# Patient Record
Sex: Female | Born: 1985 | Race: Black or African American | Hispanic: No | Marital: Single | Smoking: Never smoker
Health system: Southern US, Community
[De-identification: ages and names within clinical notes are randomized; demographics above are authoritative.]

## PROBLEM LIST (undated history)

## (undated) DIAGNOSIS — R0602 Shortness of breath: Secondary | ICD-10-CM

## (undated) DIAGNOSIS — Z21 Asymptomatic human immunodeficiency virus [HIV] infection status: Secondary | ICD-10-CM

## (undated) DIAGNOSIS — E669 Obesity, unspecified: Secondary | ICD-10-CM

## (undated) DIAGNOSIS — B20 Human immunodeficiency virus [HIV] disease: Secondary | ICD-10-CM

## (undated) DIAGNOSIS — F419 Anxiety disorder, unspecified: Secondary | ICD-10-CM

## (undated) DIAGNOSIS — B009 Herpesviral infection, unspecified: Secondary | ICD-10-CM

## (undated) DIAGNOSIS — I10 Essential (primary) hypertension: Secondary | ICD-10-CM

## (undated) DIAGNOSIS — R5383 Other fatigue: Secondary | ICD-10-CM

## (undated) DIAGNOSIS — K219 Gastro-esophageal reflux disease without esophagitis: Secondary | ICD-10-CM

## (undated) HISTORY — DX: Shortness of breath: R06.02

## (undated) HISTORY — PX: INCISION AND DRAINAGE ABSCESS: SHX5864

## (undated) HISTORY — DX: Other fatigue: R53.83

## (undated) HISTORY — PX: ANKLE SURGERY: SHX546

---
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
## 2004-08-25 ENCOUNTER — Emergency Department (HOSPITAL_COMMUNITY): Admission: EM | Admit: 2004-08-25 | Discharge: 2004-08-25 | Payer: Self-pay | Admitting: Family Medicine

## 2005-01-20 ENCOUNTER — Emergency Department (HOSPITAL_COMMUNITY): Admission: EM | Admit: 2005-01-20 | Discharge: 2005-01-20 | Payer: Self-pay | Admitting: Family Medicine

## 2005-08-22 ENCOUNTER — Inpatient Hospital Stay (HOSPITAL_COMMUNITY): Admission: AD | Admit: 2005-08-22 | Discharge: 2005-08-23 | Payer: Self-pay | Admitting: Obstetrics and Gynecology

## 2005-08-23 ENCOUNTER — Ambulatory Visit (HOSPITAL_COMMUNITY): Admission: RE | Admit: 2005-08-23 | Discharge: 2005-08-23 | Payer: Self-pay | Admitting: Obstetrics and Gynecology

## 2005-08-25 ENCOUNTER — Inpatient Hospital Stay (HOSPITAL_COMMUNITY): Admission: AD | Admit: 2005-08-25 | Discharge: 2005-08-25 | Payer: Self-pay | Admitting: *Deleted

## 2005-11-11 ENCOUNTER — Emergency Department (HOSPITAL_COMMUNITY): Admission: EM | Admit: 2005-11-11 | Discharge: 2005-11-11 | Payer: Self-pay | Admitting: Family Medicine

## 2005-12-12 ENCOUNTER — Inpatient Hospital Stay (HOSPITAL_COMMUNITY): Admission: AD | Admit: 2005-12-12 | Discharge: 2005-12-12 | Payer: Self-pay | Admitting: Gynecology

## 2005-12-16 ENCOUNTER — Ambulatory Visit: Payer: Self-pay | Admitting: Family Medicine

## 2005-12-16 ENCOUNTER — Inpatient Hospital Stay (HOSPITAL_COMMUNITY): Admission: AD | Admit: 2005-12-16 | Discharge: 2005-12-16 | Payer: Self-pay | Admitting: Obstetrics and Gynecology

## 2005-12-20 ENCOUNTER — Inpatient Hospital Stay (HOSPITAL_COMMUNITY): Admission: AD | Admit: 2005-12-20 | Discharge: 2005-12-22 | Payer: Self-pay | Admitting: Obstetrics and Gynecology

## 2005-12-21 ENCOUNTER — Ambulatory Visit: Payer: Self-pay | Admitting: Family Medicine

## 2006-01-10 ENCOUNTER — Inpatient Hospital Stay (HOSPITAL_COMMUNITY): Admission: AD | Admit: 2006-01-10 | Discharge: 2006-01-12 | Payer: Self-pay | Admitting: Family Medicine

## 2006-01-10 ENCOUNTER — Ambulatory Visit: Payer: Self-pay | Admitting: Certified Nurse Midwife

## 2006-04-08 ENCOUNTER — Emergency Department (HOSPITAL_COMMUNITY): Admission: EM | Admit: 2006-04-08 | Discharge: 2006-04-08 | Payer: Self-pay | Admitting: Family Medicine

## 2006-04-10 ENCOUNTER — Emergency Department (HOSPITAL_COMMUNITY): Admission: EM | Admit: 2006-04-10 | Discharge: 2006-04-10 | Payer: Self-pay | Admitting: Family Medicine

## 2006-07-12 ENCOUNTER — Emergency Department (HOSPITAL_COMMUNITY): Admission: EM | Admit: 2006-07-12 | Discharge: 2006-07-13 | Payer: Self-pay | Admitting: Emergency Medicine

## 2007-10-06 ENCOUNTER — Emergency Department (HOSPITAL_COMMUNITY): Admission: EM | Admit: 2007-10-06 | Discharge: 2007-10-06 | Payer: Self-pay | Admitting: Family Medicine

## 2007-10-08 ENCOUNTER — Emergency Department (HOSPITAL_COMMUNITY): Admission: EM | Admit: 2007-10-08 | Discharge: 2007-10-08 | Payer: Self-pay | Admitting: Family Medicine

## 2008-02-26 ENCOUNTER — Inpatient Hospital Stay (HOSPITAL_COMMUNITY): Admission: AD | Admit: 2008-02-26 | Discharge: 2008-02-27 | Payer: Self-pay | Admitting: Obstetrics & Gynecology

## 2008-06-19 ENCOUNTER — Emergency Department (HOSPITAL_COMMUNITY): Admission: EM | Admit: 2008-06-19 | Discharge: 2008-06-19 | Payer: Self-pay | Admitting: Family Medicine

## 2008-08-06 ENCOUNTER — Emergency Department (HOSPITAL_COMMUNITY): Admission: EM | Admit: 2008-08-06 | Discharge: 2008-08-06 | Payer: Self-pay | Admitting: Emergency Medicine

## 2008-08-20 ENCOUNTER — Ambulatory Visit: Payer: Self-pay | Admitting: Family Medicine

## 2008-08-20 ENCOUNTER — Inpatient Hospital Stay (HOSPITAL_COMMUNITY): Admission: AD | Admit: 2008-08-20 | Discharge: 2008-08-20 | Payer: Self-pay | Admitting: Family Medicine

## 2008-08-23 ENCOUNTER — Inpatient Hospital Stay (HOSPITAL_COMMUNITY): Admission: AD | Admit: 2008-08-23 | Discharge: 2008-08-23 | Payer: Self-pay | Admitting: Obstetrics & Gynecology

## 2008-08-23 ENCOUNTER — Ambulatory Visit: Payer: Self-pay | Admitting: Obstetrics and Gynecology

## 2008-09-05 ENCOUNTER — Inpatient Hospital Stay (HOSPITAL_COMMUNITY): Admission: AD | Admit: 2008-09-05 | Discharge: 2008-09-05 | Payer: Self-pay | Admitting: Obstetrics & Gynecology

## 2008-09-09 ENCOUNTER — Inpatient Hospital Stay (HOSPITAL_COMMUNITY): Admission: AD | Admit: 2008-09-09 | Discharge: 2008-09-11 | Payer: Self-pay | Admitting: Family Medicine

## 2008-09-09 ENCOUNTER — Ambulatory Visit: Payer: Self-pay | Admitting: Advanced Practice Midwife

## 2008-09-27 ENCOUNTER — Emergency Department (HOSPITAL_COMMUNITY): Admission: EM | Admit: 2008-09-27 | Discharge: 2008-09-27 | Payer: Self-pay | Admitting: Emergency Medicine

## 2009-09-02 ENCOUNTER — Emergency Department (HOSPITAL_COMMUNITY): Admission: EM | Admit: 2009-09-02 | Discharge: 2009-09-02 | Payer: Self-pay | Admitting: Family Medicine

## 2009-12-30 ENCOUNTER — Emergency Department (HOSPITAL_COMMUNITY): Admission: EM | Admit: 2009-12-30 | Discharge: 2009-12-30 | Payer: Self-pay | Admitting: Family Medicine

## 2010-10-05 LAB — CBC
HCT: 34.8 % — ABNORMAL LOW (ref 36.0–46.0)
MCHC: 32.9 g/dL (ref 30.0–36.0)
WBC: 8.4 10*3/uL (ref 4.0–10.5)

## 2010-10-20 ENCOUNTER — Inpatient Hospital Stay (HOSPITAL_COMMUNITY)
Admission: AD | Admit: 2010-10-20 | Discharge: 2010-10-20 | Disposition: A | Discharge status: Home / Self Care | Payer: Medicaid Other | Source: Ambulatory Visit | Attending: Family Medicine | Admitting: Family Medicine

## 2010-10-20 DIAGNOSIS — N390 Urinary tract infection, site not specified: Secondary | ICD-10-CM

## 2010-10-20 DIAGNOSIS — K219 Gastro-esophageal reflux disease without esophagitis: Secondary | ICD-10-CM

## 2010-10-20 DIAGNOSIS — R109 Unspecified abdominal pain: Secondary | ICD-10-CM

## 2010-10-20 LAB — URINE MICROSCOPIC-ADD ON

## 2010-10-20 LAB — CBC
Hemoglobin: 12.3 g/dL (ref 12.0–15.0)
MCHC: 32.1 g/dL (ref 30.0–36.0)
RBC: 4.44 MIL/uL (ref 3.87–5.11)

## 2010-10-20 LAB — URINALYSIS, ROUTINE W REFLEX MICROSCOPIC
Bilirubin Urine: NEGATIVE
Glucose, UA: NEGATIVE mg/dL
Hgb urine dipstick: NEGATIVE
Specific Gravity, Urine: 1.025 (ref 1.005–1.030)
pH: 6.5 (ref 5.0–8.0)

## 2010-10-23 LAB — URINE CULTURE: Colony Count: >=100000

## 2010-11-10 NOTE — Discharge Summary (Signed)
NAME:  Brenda Tyler, Brenda Tyler               ACCOUNT NO.:  [id number]   MEDICAL RECORD NO.:  [id number]          PATIENT TYPE:  INP   LOCATION:  9133                          FACILITY:  [facility]   PHYSICIAN:  Angie B. Merlene Morse, MD  DATE OF BIRTH:  04/25/1986   DATE OF ADMISSION:  12/20/2005  DATE OF DISCHARGE:  12/22/2005                                 DISCHARGE SUMMARY   ADMITTING DIAGNOSES:  1.  A 35 and 4/7 week intrauterine pregnancy.  2.  Back pain.  3.  Hematuria.  4.  Urinary tract infection.   DISCHARGE DIAGNOSES:  1.  A 35 and 6/7 week intrauterine pregnancy.  2.  Musculoskeletal back pain.  3.  Pyelonephritis.  .   ADMITTING ATTENDING:  Dr. Okey Dupre.   DISCHARGE ATTENDING:  Dr. Shawnie Pons.   ADMITTING HISTORY AND PHYSICAL:  The patient is a 25 year old, G3, P 1-0-1-1  at 79 and 4 who presented complaining of stomach pain, back pain and  recently diagnosed with urinary tract infection.  She had nausea and  vomiting and was only able to keep 2 doses of the medication down.  She said  she did have gross hematuria with that and was no longer having hematuria at  the time of presentation to the hospital.   MEDICATIONS:  Ibuprofen and Septra.   OBSTETRICAL HISTORY:  No complications.   GYNECOLOGICAL HISTORY:  History of Trichomonas, GC, UTI and LSIL.   MEDICAL HISTORY:  Allergic rhinitis.   SOCIAL HISTORY:  Negative.   PHYSICAL EXAMINATION:  VITAL SIGNS: Temp 98.2, pulse 89 and blood pressure  130/70.  GENERAL:  A well-developed, well-nourished female in no acute distress.  CARDIOVASCULAR:  Regular rhythm and rate.  CHEST:  Clear to auscultation bilaterally.  ABDOMEN:  Gravid.  Tender to palpation with no CVA tenderness.   HOSPITAL COURSE:  The patient was admitted.  She was given IV fluid  rehydration.  She had a renal ultrasound done, which was negative for  stones.  Her pain was treated as needed with Percocet.  She did receive  Rocephin.  At the time of discharge, the patient was  stating that her pain  was only at night.  It was not CVA tenderness.  Her nausea and vomiting  resolved.  She was taking p.o. well.   DISCHARGE INSTRUCTIONS:  The patient was discharged home.  She is to follow  up, to call and make an appointment for Friday.   DISCHARGE MEDICATIONS:  1.  Keflex 500 mg 1 p.o. q.i.d. x11 days and then 1 p.o. b.i.d. until      delivery.  The patient was counseled on the importance of taking all her      medications.  2.  Percocet 5, one p.o. q.4-6h. p.r.n. pain, #20 with no refills.  The      patient was instructed if her pain was continuing on Friday at her      follow up appointment, she could discuss with the Keenan Dimitrov of physical      therapy referral.           ______________________________  Karoline Caldwell  Gardiner Fanti, MD     ABC/MEDQ  D:  12/22/2005  T:  12/22/2005  Job:  [id number]

## 2011-03-15 ENCOUNTER — Inpatient Hospital Stay (INDEPENDENT_AMBULATORY_CARE_PROVIDER_SITE_OTHER)
Admission: RE | Admit: 2011-03-15 | Discharge: 2011-03-15 | Disposition: A | Discharge status: Home / Self Care | Payer: Self-pay | Source: Ambulatory Visit | Attending: Emergency Medicine | Admitting: Emergency Medicine

## 2011-03-15 DIAGNOSIS — H0019 Chalazion unspecified eye, unspecified eyelid: Secondary | ICD-10-CM

## 2011-03-15 LAB — POCT URINALYSIS DIP (DEVICE)
Bilirubin Urine: NEGATIVE
Hgb urine dipstick: NEGATIVE
Nitrite: NEGATIVE
Specific Gravity, Urine: >=1.03 (ref 1.005–1.030)
pH: 6 (ref 5.0–8.0)

## 2011-03-20 LAB — CULTURE, ROUTINE-ABSCESS

## 2011-03-28 LAB — DIFFERENTIAL
Basophils Absolute: 0
Eosinophils Absolute: 0.1
Eosinophils Relative: 2
Lymphocytes Relative: 24
Lymphs Abs: 1.4
Neutrophils Relative %: 65

## 2011-03-28 LAB — GC/CHLAMYDIA PROBE AMP, GENITAL: GC Probe Amp, Genital: NEGATIVE

## 2011-03-28 LAB — URINALYSIS, ROUTINE W REFLEX MICROSCOPIC
Ketones, ur: NEGATIVE
Leukocytes, UA: NEGATIVE
Nitrite: POSITIVE — AB
Protein, ur: NEGATIVE
Urobilinogen, UA: 1
pH: 6

## 2011-03-28 LAB — URINE CULTURE: Colony Count: >=100000

## 2011-03-28 LAB — BASIC METABOLIC PANEL
BUN: 9
Calcium: 9.4
Creatinine, Ser: 0.65
GFR calc non Af Amer: >60
Glucose, Bld: 99

## 2011-03-28 LAB — WET PREP, GENITAL
Trich, Wet Prep: NONE SEEN
Yeast Wet Prep HPF POC: NONE SEEN

## 2011-03-28 LAB — URINE MICROSCOPIC-ADD ON

## 2011-03-28 LAB — CBC
Platelets: 306
RDW: 13.4
WBC: 5.8

## 2011-10-09 ENCOUNTER — Inpatient Hospital Stay (HOSPITAL_COMMUNITY)
Admission: AD | Admit: 2011-10-09 | Discharge: 2011-10-09 | Disposition: A | Discharge status: Home / Self Care | Payer: Medicaid Other | Source: Ambulatory Visit | Attending: Obstetrics and Gynecology | Admitting: Obstetrics and Gynecology

## 2011-10-09 ENCOUNTER — Encounter (HOSPITAL_COMMUNITY): Payer: Self-pay | Admitting: *Deleted

## 2011-10-09 DIAGNOSIS — B9689 Other specified bacterial agents as the cause of diseases classified elsewhere: Secondary | ICD-10-CM | POA: Insufficient documentation

## 2011-10-09 DIAGNOSIS — Z202 Contact with and (suspected) exposure to infections with a predominantly sexual mode of transmission: Secondary | ICD-10-CM

## 2011-10-09 DIAGNOSIS — N760 Acute vaginitis: Secondary | ICD-10-CM | POA: Insufficient documentation

## 2011-10-09 DIAGNOSIS — A499 Bacterial infection, unspecified: Secondary | ICD-10-CM

## 2011-10-09 DIAGNOSIS — R1013 Epigastric pain: Secondary | ICD-10-CM | POA: Insufficient documentation

## 2011-10-09 DIAGNOSIS — K219 Gastro-esophageal reflux disease without esophagitis: Secondary | ICD-10-CM | POA: Insufficient documentation

## 2011-10-09 LAB — WET PREP, GENITAL
Trich, Wet Prep: NONE SEEN
Yeast Wet Prep HPF POC: NONE SEEN

## 2011-10-09 LAB — URINALYSIS, ROUTINE W REFLEX MICROSCOPIC
Bilirubin Urine: NEGATIVE
Glucose, UA: NEGATIVE mg/dL
Hgb urine dipstick: NEGATIVE
Nitrite: NEGATIVE
Specific Gravity, Urine: 1.015 (ref 1.005–1.030)
Urobilinogen, UA: 0.2 mg/dL (ref 0.0–1.0)

## 2011-10-09 LAB — POCT PREGNANCY, URINE: Preg Test, Ur: NEGATIVE

## 2011-10-09 MED ORDER — METRONIDAZOLE 500 MG PO TABS: 500.0000 mg | ORAL_TABLET | Freq: Two times a day (BID) | ORAL | Status: AC

## 2011-10-09 MED ORDER — RANITIDINE HCL 150 MG PO TABS: 150.0000 mg | ORAL_TABLET | Freq: Two times a day (BID) | ORAL | Status: DC

## 2011-10-09 MED ORDER — GI COCKTAIL ~~LOC~~
30.0000 mL | Freq: Once | ORAL | Status: AC
  Administered 2011-10-09: 30 mL via ORAL
  Filled 2011-10-09: qty 30

## 2011-10-09 NOTE — MAU Note (Signed)
Patient states she has been having upper abdominal pain for 2-3 weeks. Had Depo with her last delivery 3 years ago and only had one dose. States her periods have been irregular since that time. Will have short heavy periods of bleeding. Last one 4-2.

## 2011-10-09 NOTE — MAU Provider Note (Signed)
History   Pt presents today c/o epigastric pain that comes and goes. She denies N&V, lower abd pain, or any other sx. She is an extremely poor historian. She denies vag dc, bleeding, or any other sx.  CSN: [id number]  Arrival date and time: 10/09/11 1228   First Provider Initiated Contact with Patient 10/09/11 1347      Chief Complaint  Patient presents with  . Abdominal Pain   HPI  OB History    Grav Para Term Preterm Abortions TAB SAB Ect Mult Living   4 3 3  1 1    3       No past medical history on file.  No past surgical history on file.  Family History  Problem Relation Age of Onset  . Hypertension Maternal Grandmother   . Diabetes Maternal Grandmother   . Hypertension Paternal Grandmother   . Diabetes Paternal Grandmother     History  Substance Use Topics  . Smoking status: Never Smoker   . Smokeless tobacco: Not on file  . Alcohol Use: No    Allergies: No Known Allergies  Prescriptions prior to admission  Medication Sig Dispense Refill  . Fexofenadine HCl (ALLEGRA PO) Take 1 tablet by mouth every morning.        Review of Systems  Constitutional: Negative for fever and chills.  Eyes: Negative for blurred vision and double vision.  Respiratory: Negative for cough, hemoptysis, sputum production, shortness of breath and wheezing.   Cardiovascular: Negative for chest pain and palpitations.  Gastrointestinal: Positive for abdominal pain. Negative for nausea, vomiting, diarrhea and constipation.  Genitourinary: Negative for dysuria, urgency, frequency and hematuria.  Neurological: Negative for dizziness and headaches.  Psychiatric/Behavioral: Negative for depression and suicidal ideas.   Physical Exam   Blood pressure 115/80, pulse 72, temperature 97 F (36.1 C), temperature source Oral, resp. rate 18, height 5\' 4"  (1.626 m), weight 249 lb 12.8 oz (113.309 kg), last menstrual period 09/25/2011, SpO2 100.00%.  Physical Exam  Nursing note and vitals  reviewed. Constitutional: She is oriented to person, place, and time. She appears well-developed and well-nourished. No distress.  HENT:  Head: Normocephalic and atraumatic.  Eyes: EOM are normal. Pupils are equal, round, and reactive to light.  GI: Soft. She exhibits no distension and no mass. There is no tenderness. There is no rebound and no guarding.  Neurological: She is alert and oriented to person, place, and time.  Skin: Skin is warm and dry. She is not diaphoretic.  Psychiatric: She has a normal mood and affect. Her behavior is normal. Judgment and thought content normal.    MAU Course  Procedures  Results for orders placed during the hospital encounter of 10/09/11 (from the past 24 hour(s))  URINALYSIS, ROUTINE W REFLEX MICROSCOPIC     Status: Abnormal   Collection Time   10/09/11  1:15 PM      Component Value Range   Color, Urine YELLOW  YELLOW    APPearance HAZY (*) CLEAR    Specific Gravity, Urine 1.015  1.005 - 1.030    pH 6.5  5.0 - 8.0    Glucose, UA NEGATIVE  NEGATIVE (mg/dL)   Hgb urine dipstick NEGATIVE  NEGATIVE    Bilirubin Urine NEGATIVE  NEGATIVE    Ketones, ur NEGATIVE  NEGATIVE (mg/dL)   Protein, ur NEGATIVE  NEGATIVE (mg/dL)   Urobilinogen, UA 0.2  0.0 - 1.0 (mg/dL)   Nitrite NEGATIVE  NEGATIVE    Leukocytes, UA NEGATIVE  NEGATIVE  POCT PREGNANCY, URINE     Status: Normal   Collection Time   10/09/11  1:29 PM      Component Value Range   Preg Test, Ur NEGATIVE  NEGATIVE     Pt pain has resolved following GI cocktail. Assessment and Plan  GERD: discussed with pt at length. Will give Rx for zantac. Discussed diet, activity, risks, and precautions.  Clinton Gallant. Rice III, DrHSc, MPAS, PA-C  10/09/2011, 1:52 PM   After PA left room, pt disclosed to RN that she was having vaginal odor and that her partner admitted to having sex with a woman who was Dx w/ Ivery Quale. Re Questing Wet Prep and GC/CT. Informed pt that this testing does not cover all STDs. Referred  to STD clinic for further testing.  Pelvic: Small amount of malodorous discharge. No cervical discharge, CMT or adnexal tenderness. Vaginal and cervix pink and smooth.  Microscopic wet-mount exam shows negative for pathogens, normal epithelial cells, white blood cells.  Assessment:  BV by clinical Dx GC/CT pending Recommend condom use Follow-up Information    Please follow up. (As needed)         Medication List  As of 10/09/2011  4:20 PM   START taking these medications         metroNIDAZOLE 500 MG tablet   Commonly known as: FLAGYL   Take 1 tablet (500 mg total) by mouth 2 (two) times daily.      ranitidine 150 MG tablet   Commonly known as: ZANTAC   Take 1 tablet (150 mg total) by mouth 2 (two) times daily.         CONTINUE taking these medications         ALLEGRA PO          Where to get your medications    These are the prescriptions that you need to pick up.   You may get these medications from any pharmacy.         metroNIDAZOLE 500 MG tablet   ranitidine 150 MG tablet           Jaydence Vanyo 10/09/2011 4:20 PM

## 2011-10-09 NOTE — Discharge Instructions (Signed)
Diet for GERD or PUD Nutrition therapy can help ease the discomfort of gastroesophageal reflux disease (GERD) and peptic ulcer disease (PUD).  HOME CARE INSTRUCTIONS   Eat your meals slowly, in a relaxed setting.   Eat 5 to 6 small meals per day.   If a food causes distress, stop eating it for a period of time.  FOODS TO AVOID  Coffee, regular or decaffeinated.   Cola beverages, regular or low calorie.   Tea, regular or decaffeinated.   Pepper.   Cocoa.   High fat foods, including meats.   Butter, margarine, hydrogenated oil (trans fats).   Peppermint or spearmint (if you have GERD).   Fruits and vegetables if not tolerated.   Alcohol.   Nicotine (smoking or chewing). This is one of the most potent stimulants to acid production in the gastrointestinal tract.   Any food that seems to aggravate your condition.  If you have questions regarding your diet, ask your caregiver or a registered dietitian. TIPS  Lying flat may make symptoms worse. Keep the head of your bed raised 6 to 9 inches (15 to 23 cm) by using a foam wedge or blocks under the legs of the bed.   Do not lay down until 3 hours after eating a meal.   Daily physical activity may help reduce symptoms.  MAKE SURE YOU:   Understand these instructions.   Will watch your condition.   Will get help right away if you are not doing well or get worse.  Document Released: 06/11/2005 Document Revised: 05/31/2011 Document Reviewed: 04/27/2011 [facility] Patient Information 2012 [place], [place].Bacterial Vaginosis Bacterial vaginosis is an infection of the vagina. A healthy vagina has many kinds of good germs (bacteria). Sometimes the number of good germs can change. This allows bad germs to move in and cause an infection. You may be given medicine (antibiotics) to treat the infection. Or, you may not need treatment at all. HOME CARE  Take your medicine as told. Finish them even if you start to feel better.   Do not  have sex until you finish your medicine.   Do not douche.   Practice safe sex.   Tell your sex partner that you have an infection. They should see their doctor for treatment if they have problems.  GET HELP RIGHT AWAY IF:  You do not get better after 3 days of treatment.   You have grey fluid (discharge) coming from your vagina.   You have pain.   You have a temperature of 102 F (38.9 C) or higher.  MAKE SURE YOU:   Understand these instructions.   Will watch your condition.   Will get help right away if you are not doing well or get worse.  Document Released: 03/20/2008 Document Revised: 05/31/2011 Document Reviewed: 03/20/2008 [facility] Patient Information 2012 [place], [place].

## 2011-10-10 NOTE — MAU Provider Note (Signed)
Agree with above note.  Brenda Tyler 10/10/2011 7:12 AM

## 2011-10-20 ENCOUNTER — Other Ambulatory Visit: Payer: Self-pay | Admitting: Advanced Practice Midwife

## 2011-12-03 ENCOUNTER — Emergency Department (HOSPITAL_COMMUNITY): Payer: Medicaid Other

## 2011-12-03 ENCOUNTER — Encounter (HOSPITAL_COMMUNITY): Payer: Self-pay | Admitting: *Deleted

## 2011-12-03 ENCOUNTER — Emergency Department (HOSPITAL_COMMUNITY)
Admission: EM | Admit: 2011-12-03 | Discharge: 2011-12-03 | Disposition: A | Discharge status: Home / Self Care | Payer: Medicaid Other | Attending: Emergency Medicine | Admitting: Emergency Medicine

## 2011-12-03 DIAGNOSIS — Y998 Other external cause status: Secondary | ICD-10-CM | POA: Insufficient documentation

## 2011-12-03 DIAGNOSIS — Y9383 Activity, rough housing and horseplay: Secondary | ICD-10-CM | POA: Insufficient documentation

## 2011-12-03 DIAGNOSIS — W19XXXA Unspecified fall, initial encounter: Secondary | ICD-10-CM

## 2011-12-03 DIAGNOSIS — S20219A Contusion of unspecified front wall of thorax, initial encounter: Secondary | ICD-10-CM

## 2011-12-03 DIAGNOSIS — W010XXA Fall on same level from slipping, tripping and stumbling without subsequent striking against object, initial encounter: Secondary | ICD-10-CM | POA: Insufficient documentation

## 2011-12-03 LAB — CBC
Hemoglobin: 12.7 g/dL (ref 12.0–15.0)
MCH: 28.7 pg (ref 26.0–34.0)
MCHC: 33.7 g/dL (ref 30.0–36.0)
MCV: 85.1 fL (ref 78.0–100.0)
RBC: 4.43 MIL/uL (ref 3.87–5.11)

## 2011-12-03 MED ORDER — KETOROLAC TROMETHAMINE 60 MG/2ML IM SOLN
60.0000 mg | Freq: Once | INTRAMUSCULAR | Status: DC
  Filled 2011-12-03: qty 2

## 2011-12-03 MED ORDER — NAPROXEN 375 MG PO TABS: 375.0000 mg | ORAL_TABLET | Freq: Two times a day (BID) | ORAL | Status: DC

## 2011-12-03 NOTE — ED Notes (Signed)
The pt fell last pm on the concrete playing with her children.  She has pain in her rt lower ribs and she felt something pop

## 2011-12-03 NOTE — ED Provider Notes (Signed)
History     CSN: [id number]  Arrival date & time 12/03/11  7829   First MD Initiated Contact with Patient 12/03/11 1933      Chief Complaint  Patient presents with  . Fall    (Consider location/radiation/quality/duration/timing/severity/associated sxs/prior treatment) Patient is a 26 y.o. female presenting with fall. The history is provided by the patient.  Fall   patient tripped and fell while playing with her children yesterday. She fell onto her right side onto a concrete sidewalk from standing. No wounds. Pain to the right lower rib cage. Denies chest pain, shortness of breath, abd pain, or lightheadedness. No head injury or LOC. Deep breathing and palpation of the area make her pain worse. Nothing makes it better. No prior treatment attempted.  History reviewed. No pertinent past medical history.  History reviewed. No pertinent past surgical history.  Family History  Problem Relation Age of Onset  . Hypertension Maternal Grandmother   . Diabetes Maternal Grandmother   . Hypertension Paternal Grandmother   . Diabetes Paternal Grandmother     History  Substance Use Topics  . Smoking status: Never Smoker   . Smokeless tobacco: Not on file  . Alcohol Use: No    OB History    Grav Para Term Preterm Abortions TAB SAB Ect Mult Living   4 3 3  1 1    3       Review of Systems All pertinent positives and negatives are reviewed in the history of present illness.  Allergies  Review of patient's allergies indicates no known allergies.  Home Medications  No current outpatient prescriptions on file.  BP 123/76  Pulse 83  Temp(Src) 98.2 F (36.8 C) (Oral)  Resp 19  SpO2 100%  LMP 11/02/2011  Physical Exam  Nursing note reviewed. Constitutional: She is oriented to person, place, and time. She appears well-developed and well-nourished. No distress.       Vital signs are reviewed and are normal.   HENT:  Head: Normocephalic and atraumatic.       MMM  Eyes:  Pupils are equal, round, and reactive to light.  Cardiovascular: Normal rate, regular rhythm and normal heart sounds.   Pulmonary/Chest: Effort normal and breath sounds normal. No respiratory distress. She has no wheezes.    Abdominal: Soft. Bowel sounds are normal. She exhibits no distension. There is no tenderness. There is no rebound and no guarding.  Musculoskeletal: She exhibits no edema and no tenderness.  Neurological: She is alert and oriented to person, place, and time.  Skin: Skin is warm and dry. No rash noted. No erythema.  Psychiatric: She has a normal mood and affect.    ED Course  Procedures (including critical care time)   Labs Reviewed  CBC  POCT PREGNANCY, URINE   Dg Ribs Unilateral W/chest Right  12/03/2011  *RADIOLOGY REPORT*  Clinical Data: Fall yesterday with right-sided rib pain.  RIGHT RIBS AND CHEST - 3+ VIEW  Comparison: None.  Findings: Frontal view chest demonstrates midline trachea.  Minimal convex left thoracic spine curvature. Normal heart size and mediastinal contours. No pleural effusion or pneumothorax.  Clear lungs.  3 views of right-sided ribs.  Radiographic marker projects over the 11th and 12th right ribs.  No displaced fracture identified.  IMPRESSION: No displaced rib fracture or pneumothorax. No acute cardiopulmonary disease.  Original Report Authenticated By: Consuello Bossier, M.D.     Dx 1: Fall Dx 2: Rib contusion   MDM  Mechanical fall with right lower  rib cage injury with no overlying skin changes. No displaced fx on CXR. No anemia or abd pain on palpation to suggest sig abd injury. Neg preg test. Will give NSAID rx.        Shaaron Adler, PA-C 12/03/11 2106  Shaaron Adler, PA-C 12/03/11 2108

## 2011-12-03 NOTE — Discharge Instructions (Signed)
Rib Contusion  A rib contusion (bruise) can occur by a blow to the chest or by a fall against a hard object. Usually these will be much better in a couple weeks. If X-rays were taken today and there are no broken bones (fractures), the diagnosis of bruising is made. However, broken ribs may not show up for several days, or may be discovered later on a routine X-ray when signs of healing show up. If this happens to you, it does not mean that something was missed on the X-ray, but simply that it did not show up on the first X-rays. Earlier diagnosis will not usually change the treatment.  HOME CARE INSTRUCTIONS    Avoid strenuous activity. Be careful during activities and avoid bumping the injured ribs. Activities that pull on the injured ribs and cause pain should be avoided, if possible.   For the first day or two, an ice pack used every 20 minutes while awake may be helpful. Put ice in a plastic bag and put a towel between the bag and the skin.   Eat a normal, well-balanced diet. Drink plenty of fluids to avoid constipation.   Take deep breaths several times a day to keep lungs free of infection. Try to cough several times a day. Splint the injured area with a pillow while coughing to ease pain. Coughing can help prevent pneumonia.   Wear a rib belt or binder only if told to do so by your caregiver. If you are wearing a rib belt or binder, you must do the breathing exercises as directed by your caregiver. If not used properly, rib belts or binders restrict breathing which can lead to pneumonia.   Only take over-the-counter or prescription medicines for pain, discomfort, or fever as directed by your caregiver.  SEEK MEDICAL CARE IF:    You or your child has an oral temperature above 102 F (38.9 C).   Your baby is older than 3 months with a rectal temperature of 100.5 F (38.1 C) or higher for more than 1 day.   You develop a cough, with thick or bloody sputum.  SEEK IMMEDIATE MEDICAL CARE IF:    You  have difficulty breathing.   You feel sick to your stomach (nausea), have vomiting or belly (abdominal) pain.   You have worsening pain, not controlled with medications, or there is a change in the location of the pain.   You develop sweating or radiation of the pain into the arms, jaw or shoulders, or become light headed or faint.   You or your child has an oral temperature above 102 F (38.9 C), not controlled by medicine.   Your or your baby is older than 3 months with a rectal temperature of 102 F (38.9 C) or higher.   Your baby is 3 months old or younger with a rectal temperature of 100.4 F (38 C) or higher.  MAKE SURE YOU:    Understand these instructions.   Will watch your condition.   Will get help right away if you are not doing well or get worse.  Document Released: 03/06/2001 Document Revised: 05/31/2011 Document Reviewed: 01/28/2008  [employer] Patient Information 2012 [employer].

## 2011-12-03 NOTE — ED Notes (Signed)
Patient transported to X-ray 

## 2011-12-03 NOTE — ED Notes (Signed)
PA at bedside.

## 2011-12-05 NOTE — ED Provider Notes (Signed)
Medical screening examination/treatment/procedure(s) were performed by non-physician practitioner and as supervising physician I was immediately available for consultation/collaboration.   Joya Gaskins, MD 12/05/11 [phone]

## 2012-01-10 ENCOUNTER — Encounter (HOSPITAL_COMMUNITY): Payer: Self-pay | Admitting: *Deleted

## 2012-01-10 DIAGNOSIS — N390 Urinary tract infection, site not specified: Secondary | ICD-10-CM | POA: Insufficient documentation

## 2012-01-10 DIAGNOSIS — L02818 Cutaneous abscess of other sites: Secondary | ICD-10-CM | POA: Insufficient documentation

## 2012-01-10 NOTE — ED Notes (Signed)
Patient with abdominal pain.  Patient states that her stomach hurts everytime she eats.  Also complain of possible UTI since she feels burning sensation when she urinates.  Patient also c/o having rash on her head.

## 2012-01-11 ENCOUNTER — Emergency Department (HOSPITAL_COMMUNITY)
Admission: EM | Admit: 2012-01-11 | Discharge: 2012-01-11 | Disposition: A | Discharge status: Home / Self Care | Payer: Medicaid Other | Attending: Emergency Medicine | Admitting: Emergency Medicine

## 2012-01-11 DIAGNOSIS — L0390 Cellulitis, unspecified: Secondary | ICD-10-CM

## 2012-01-11 DIAGNOSIS — N390 Urinary tract infection, site not specified: Secondary | ICD-10-CM

## 2012-01-11 LAB — URINE MICROSCOPIC-ADD ON

## 2012-01-11 LAB — URINALYSIS, ROUTINE W REFLEX MICROSCOPIC
Bilirubin Urine: NEGATIVE
Glucose, UA: NEGATIVE mg/dL
Nitrite: NEGATIVE
Specific Gravity, Urine: 1.028 (ref 1.005–1.030)
pH: 6 (ref 5.0–8.0)

## 2012-01-11 MED ORDER — IBUPROFEN 200 MG PO TABS
600.0000 mg | ORAL_TABLET | Freq: Once | ORAL | Status: AC
  Administered 2012-01-11: 600 mg via ORAL
  Filled 2012-01-11: qty 3

## 2012-01-11 MED ORDER — SULFAMETHOXAZOLE-TMP DS 800-160 MG PO TABS
1.0000 | ORAL_TABLET | Freq: Once | ORAL | Status: AC
  Administered 2012-01-11: 1 via ORAL
  Filled 2012-01-11: qty 1

## 2012-01-11 MED ORDER — PHENAZOPYRIDINE HCL 200 MG PO TABS: 200.0000 mg | ORAL_TABLET | Freq: Three times a day (TID) | ORAL | Status: AC

## 2012-01-11 MED ORDER — HYDROCODONE-ACETAMINOPHEN 5-500 MG PO TABS: 1.0000 | ORAL_TABLET | Freq: Four times a day (QID) | ORAL | Status: AC | PRN

## 2012-01-11 MED ORDER — SULFAMETHOXAZOLE-TRIMETHOPRIM 800-160 MG PO TABS: 1.0000 | ORAL_TABLET | Freq: Two times a day (BID) | ORAL | Status: DC

## 2012-01-11 NOTE — ED Provider Notes (Signed)
History     CSN: [id number]  Arrival date & time 01/10/12  2321   First MD Initiated Contact with Patient 01/11/12 0203      Chief Complaint  Patient presents with  . Abdominal Pain  . Rash    (Consider location/radiation/quality/duration/timing/severity/associated sxs/prior treatment) Patient is a 26 y.o. female presenting with abdominal pain and rash. The history is provided by the patient.  Abdominal Pain The primary symptoms of the illness include abdominal pain and dysuria. The primary symptoms of the illness do not include vaginal discharge or vaginal bleeding. The current episode started yesterday. The onset of the illness was gradual. The problem has not changed since onset. The abdominal pain began yesterday. The pain came on gradually. The abdominal pain has been unchanged since its onset. The abdominal pain is located in the suprapubic region. The abdominal pain does not radiate. The severity of the abdominal pain is 2/10. The abdominal pain is relieved by nothing. The abdominal pain is exacerbated by urination.  The dysuria began yesterday. The discomfort is felt in the suprapubic area. The discomfort is mild. She is not currently sexually active. The dysuria is associated with frequency and urgency. The dysuria is not associated with discharge or hematuria.  The patient states that she believes she is currently not pregnant. The patient has not had a change in bowel habit. Additional symptoms associated with the illness include urgency and frequency. Symptoms associated with the illness do not include chills, anorexia, diaphoresis or hematuria.  Rash  This is a new problem. The current episode started more than 1 week ago. The problem has been gradually worsening. The problem is associated with nothing. There has been no fever. The fever has been present for 5 days or more. The rash is present on the scalp. The pain is at a severity of 2/10. The pain is mild. The pain has been  constant since onset. Associated symptoms include pain. Pertinent negatives include no blisters, no itching and no weeping. She has tried nothing for the symptoms. The treatment provided no relief.    History reviewed. No pertinent past medical history.  History reviewed. No pertinent past surgical history.  Family History  Problem Relation Age of Onset  . Hypertension Maternal Grandmother   . Diabetes Maternal Grandmother   . Hypertension Paternal Grandmother   . Diabetes Paternal Grandmother     History  Substance Use Topics  . Smoking status: Never Smoker   . Smokeless tobacco: Not on file  . Alcohol Use: No    OB History    Grav Para Term Preterm Abortions TAB SAB Ect Mult Living   4 3 3  1 1    3       Review of Systems  Constitutional: Negative for chills and diaphoresis.  Gastrointestinal: Positive for abdominal pain. Negative for anorexia.  Genitourinary: Positive for dysuria, urgency and frequency. Negative for hematuria, vaginal bleeding and vaginal discharge.  Skin: Positive for rash. Negative for itching.  All other systems reviewed and are negative.    Allergies  Review of patient's allergies indicates no known allergies.  Home Medications  No current outpatient prescriptions on file.  BP 119/77  Pulse 77  Temp 97.7 F (36.5 C) (Oral)  Resp 22  SpO2 100%  Physical Exam  Constitutional: She is oriented to person, place, and time. She appears well-developed and well-nourished.  HENT:  Head: Normocephalic and atraumatic.  Eyes: Conjunctivae and EOM are normal. Pupils are equal, round, and reactive to light.  Neck: Normal range of motion.  Cardiovascular: Normal rate, regular rhythm and normal heart sounds.   Pulmonary/Chest: Effort normal and breath sounds normal.  Abdominal: Soft. Bowel sounds are normal. There is tenderness in the suprapubic area.  Musculoskeletal: Normal range of motion.  Neurological: She is alert and oriented to person, place,  and time.  Skin: Skin is warm and dry.     Psychiatric: She has a normal mood and affect. Her behavior is normal.    ED Course  Procedures (including critical care time)  Labs Reviewed  URINALYSIS, ROUTINE W REFLEX MICROSCOPIC - Abnormal; Notable for the following:    Color, Urine AMBER (*)  BIOCHEMICALS MAY BE AFFECTED BY COLOR   APPearance CLOUDY (*)     Hgb urine dipstick LARGE (*)     Ketones, ur 15 (*)     Protein, ur 100 (*)     Leukocytes, UA MODERATE (*)     All other components within normal limits  URINE MICROSCOPIC-ADD ON - Abnormal; Notable for the following:    Squamous Epithelial / LPF MANY (*)     Bacteria, UA MANY (*)     All other components within normal limits  POCT PREGNANCY, URINE   No results found.   No diagnosis found.    MDM  Not preg.  Minimal scalp cellulitis.  No abscess.  + uti.  Will abx,  Dc to fu,  Ret new/worsening sxs        Gregary Blackard Lytle Michaels, MD 01/11/12 [phone]

## 2012-01-11 NOTE — ED Notes (Signed)
Pt refuses to change into gown and denies abdominal pain. Pt states" her head is hurting not her stomach."

## 2012-01-11 NOTE — ED Notes (Signed)
Pt states pain 10/10 - pain to head where she has a rash.  Pt states minimal abd pain.

## 2012-01-11 NOTE — ED Notes (Signed)
Pt in originally c/o abd pain but now states that she doesn't want to be evaluated for her abd pain and denies complaints, pt now c/o bumps to top of head that are burning and itching x1 week. Pt currently refusing to change into a gown.

## 2012-01-11 NOTE — ED Notes (Signed)
Pt ambulated with a  Steady gait; VSS; A&Ox3; no signs of distress; respirations even and unlabored; skin warm and dry; no questions at this time.  

## 2012-03-08 ENCOUNTER — Emergency Department (INDEPENDENT_AMBULATORY_CARE_PROVIDER_SITE_OTHER)
Admission: EM | Admit: 2012-03-08 | Discharge: 2012-03-08 | Disposition: A | Discharge status: Home / Self Care | Payer: Medicaid Other | Source: Home / Self Care | Attending: Family Medicine | Admitting: Family Medicine

## 2012-03-08 ENCOUNTER — Encounter (HOSPITAL_COMMUNITY): Payer: Self-pay | Admitting: Emergency Medicine

## 2012-03-08 DIAGNOSIS — L02211 Cutaneous abscess of abdominal wall: Secondary | ICD-10-CM

## 2012-03-08 DIAGNOSIS — L309 Dermatitis, unspecified: Secondary | ICD-10-CM

## 2012-03-08 DIAGNOSIS — L259 Unspecified contact dermatitis, unspecified cause: Secondary | ICD-10-CM

## 2012-03-08 DIAGNOSIS — L02219 Cutaneous abscess of trunk, unspecified: Secondary | ICD-10-CM

## 2012-03-08 MED ORDER — DOXYCYCLINE HYCLATE 100 MG PO CAPS: 100.0000 mg | ORAL_CAPSULE | Freq: Two times a day (BID) | ORAL | Status: AC

## 2012-03-08 MED ORDER — FLUTICASONE PROPIONATE 0.05 % EX CREA: TOPICAL_CREAM | Freq: Two times a day (BID) | CUTANEOUS | Status: DC

## 2012-03-08 NOTE — ED Notes (Signed)
Pt states that she has had a rash all over itching and some oozing from bumps. Also complains of boil on lower abdomen x 1 wk, swollen and red hurts with touching.

## 2012-03-08 NOTE — ED Provider Notes (Signed)
History     CSN: [id number]  Arrival date & time 03/08/12  1057   First MD Initiated Contact with Patient 03/08/12 1058      Chief Complaint  Patient presents with  . Rash    rash all over/boil on lower abdomen.    (Consider location/radiation/quality/duration/timing/severity/associated sxs/prior treatment) Patient is a 26 y.o. female presenting with rash. The history is provided by the patient.  Rash  This is a new problem. The problem has been gradually worsening. The problem is associated with an insect bite/sting. There has been no fever. The rash is present on the abdomen. The pain is mild. Associated symptoms include pain.    History reviewed. No pertinent past medical history.  History reviewed. No pertinent past surgical history.  Family History  Problem Relation Age of Onset  . Hypertension Maternal Grandmother   . Diabetes Maternal Grandmother   . Hypertension Paternal Grandmother   . Diabetes Paternal Grandmother     History  Substance Use Topics  . Smoking status: Never Smoker   . Smokeless tobacco: Not on file  . Alcohol Use: No    OB History    Grav Para Term Preterm Abortions TAB SAB Ect Mult Living   4 3 3  1 1    3       Review of Systems  Constitutional: Negative.   Skin: Positive for rash.    Allergies  Review of patient's allergies indicates no known allergies.  Home Medications   Current Outpatient Rx  Name Route Sig Dispense Refill  . DOXYCYCLINE HYCLATE 100 MG PO CAPS Oral Take 1 capsule (100 mg total) by mouth 2 (two) times daily. 20 capsule 0  . FLUTICASONE PROPIONATE 0.05 % EX CREA Topical Apply topically 2 (two) times daily. To rash. 60 g 0    BP 111/77  Pulse 80  Temp 98.9 F (37.2 C) (Oral)  Resp 19  SpO2 96%  Physical Exam  Nursing note and vitals reviewed. Constitutional: She appears well-developed and well-nourished.  Skin: Skin is warm and dry. Rash noted.       Indurated 2cm area with erythema to lower abd,  tender to palpation.    ED Course  INCISION AND DRAINAGE Date/Time: 03/08/2012 11:30 AM Performed by: Linna Hoff Authorized by: Bradd Canary D Consent: Verbal consent obtained. Consent given by: patient Type: abscess Body area: trunk Location details: abdomen Local anesthetic: topical anesthetic Scalpel size: 11 Incision type: single straight Complexity: simple Drainage: purulent Drainage amount: scant Wound treatment: wound left open Patient tolerance: Patient tolerated the procedure well with no immediate complications. Comments: Culture obtained.   (including critical care time)   Labs Reviewed  CULTURE, ROUTINE-ABSCESS   No results found.   1. Abscess of abdominal wall   2. Eczematous dermatitis       MDM          Linna Hoff, MD 03/08/12 1134

## 2012-03-11 ENCOUNTER — Encounter (HOSPITAL_COMMUNITY): Payer: Self-pay | Admitting: *Deleted

## 2012-03-11 ENCOUNTER — Emergency Department (HOSPITAL_COMMUNITY)
Admission: EM | Admit: 2012-03-11 | Discharge: 2012-03-11 | Disposition: A | Discharge status: Home / Self Care | Payer: Medicaid Other | Attending: Emergency Medicine | Admitting: Emergency Medicine

## 2012-03-11 DIAGNOSIS — R21 Rash and other nonspecific skin eruption: Secondary | ICD-10-CM | POA: Insufficient documentation

## 2012-03-11 DIAGNOSIS — Z8249 Family history of ischemic heart disease and other diseases of the circulatory system: Secondary | ICD-10-CM | POA: Insufficient documentation

## 2012-03-11 DIAGNOSIS — Z833 Family history of diabetes mellitus: Secondary | ICD-10-CM | POA: Insufficient documentation

## 2012-03-11 LAB — CULTURE, ROUTINE-ABSCESS

## 2012-03-11 MED ORDER — PERMETHRIN 5 % EX CREA: TOPICAL_CREAM | CUTANEOUS | Status: DC

## 2012-03-11 NOTE — ED Notes (Signed)
NAD noted at time of d/c home 

## 2012-03-11 NOTE — ED Notes (Signed)
Pt reports rash to entire body since the beginning of the month. Itchy in nature. 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 Airway intact. Pt has not tried to treat rash at home.

## 2012-03-11 NOTE — ED Provider Notes (Signed)
History  This chart was scribed for Joya Gaskins, MD by Ladona Ridgel Day. This patient was seen in room TR04C/TR04C and the patient's care was started at 1223.   CSN: [id number]  Arrival date & time 03/11/12  1223   First MD Initiated Contact with Patient 03/11/12 1308      Chief Complaint  Patient presents with  . Rash   Patient is a 26 y.o. female presenting with rash. The history is provided by the patient. No language interpreter was used.  Rash  This is a new problem. The current episode started more than 1 week ago. The problem has been gradually worsening. The problem is associated with nothing. There has been no fever. Affected Location: bilateral arms and thighs. The patient is experiencing no pain. Associated symptoms include itching. She has tried nothing for the symptoms.   Brenda Tyler is a 26 y.o. female who presents to the Emergency Department complaining of constant gradually worsening itchy rash over her bilateral arms and thighs for about 2 weeks. She has not tried taking any medicines. No difficulty swallowing or breathing. She has not taken an medicines. She had an abscess I&D 3 days ago which appears to be healing well.   PMH - none  History reviewed. No pertinent past surgical history.  Family History  Problem Relation Age of Onset  . Hypertension Maternal Grandmother   . Diabetes Maternal Grandmother   . Hypertension Paternal Grandmother   . Diabetes Paternal Grandmother     History  Substance Use Topics  . Smoking status: Never Smoker   . Smokeless tobacco: Not on file  . Alcohol Use: No    OB History    Grav Para Term Preterm Abortions TAB SAB Ect Mult Living   4 3 3  1 1    3       Review of Systems  Constitutional: Negative for fever and chills.  HENT: Negative for trouble swallowing.   Respiratory: Negative for shortness of breath.   Skin: Positive for itching and rash (itchy diffuse rash).    Allergies  Review of patient's allergies  indicates no known allergies.  Home Medications   Current Outpatient Rx  Name Route Sig Dispense Refill  . ACETAMINOPHEN 325 MG PO TABS Oral Take 650 mg by mouth every 6 (six) hours as needed. For pain    . ACETAMINOPHEN 500 MG PO TABS Oral Take 1,000 mg by mouth every 6 (six) hours as needed. For pain    . DOXYCYCLINE HYCLATE 100 MG PO CAPS Oral Take 1 capsule (100 mg total) by mouth 2 (two) times daily. 20 capsule 0  . FLUTICASONE PROPIONATE 0.05 % EX CREA Topical Apply topically 2 (two) times daily. To rash. 60 g 0    Triage Vitals: BP 112/75  Pulse 81  Temp 97.8 F (36.6 C) (Oral)  Resp 16  SpO2 99%  Physical Exam CONSTITUTIONAL: Well developed/well nourished HEAD AND FACE: Normocephalic/atraumatic EYES: EOMI/PERRL ENMT: Mucous membranes moist NECK: supple no meningeal signs SPINE:entire spine nontender CV: S1/S2 noted, no murmurs/rubs/gallops noted LUNGS: Lungs are clear to auscultation bilaterally, no apparent distress ABDOMEN: soft, nontender, no rebound or guarding, healing abdominal wall abscess, no crepitance or induration GU:no cva tenderness NEURO: Pt is awake/alert, moves all extremitiesx4 EXTREMITIES: pulses normal, full ROM SKIN: warm, color normal, scattered erythematous papules throughout body PSYCH: no abnormalities of mood noted ED Course  Procedures  DIAGNOSTIC STUDIES: Oxygen Saturation is 99% on room air, normal by my interpretation.  COORDINATION OF CARE: At 145 PM PM Discussed treatment plan with patient which includes elimite, and benadryl. Patient agrees.   Advised treatment to continue on doxycycline, if no improvement can start elimite    MDM  Nursing notes including past medical history and social history reviewed and considered in documentation Previous records reviewed and considered   I personally performed the services described in this documentation, which was scribed in my presence. The recorded information has been reviewed and  considered.          Joya Gaskins, MD 03/11/12 [phone]

## 2012-03-13 NOTE — ED Notes (Signed)
Abscess culture: Mod. Staph. Aureus.  Pt. adequately treated with Doxycycline. Brenda Tyler 03/13/2012

## 2012-06-02 ENCOUNTER — Emergency Department (HOSPITAL_COMMUNITY)
Admission: EM | Admit: 2012-06-02 | Discharge: 2012-06-02 | Disposition: A | Discharge status: Home / Self Care | Payer: Medicaid Other | Attending: Emergency Medicine | Admitting: Emergency Medicine

## 2012-06-02 DIAGNOSIS — L0231 Cutaneous abscess of buttock: Secondary | ICD-10-CM | POA: Insufficient documentation

## 2012-06-02 DIAGNOSIS — L03317 Cellulitis of buttock: Secondary | ICD-10-CM | POA: Insufficient documentation

## 2012-06-02 DIAGNOSIS — L0291 Cutaneous abscess, unspecified: Secondary | ICD-10-CM

## 2012-06-02 MED ORDER — OXYCODONE-ACETAMINOPHEN 5-325 MG PO TABS: 1.0000 | ORAL_TABLET | ORAL | Status: DC | PRN

## 2012-06-02 MED ORDER — OXYCODONE-ACETAMINOPHEN 5-325 MG PO TABS
1.0000 | ORAL_TABLET | Freq: Once | ORAL | Status: AC
  Administered 2012-06-02: 1 via ORAL
  Filled 2012-06-02: qty 1

## 2012-06-02 MED ORDER — SULFAMETHOXAZOLE-TRIMETHOPRIM 800-160 MG PO TABS: 1.0000 | ORAL_TABLET | Freq: Two times a day (BID) | ORAL | Status: DC

## 2012-06-02 NOTE — ED Provider Notes (Signed)
History     CSN: [id number]  Arrival date & time 06/02/12  1652   First MD Initiated Contact with Patient 06/02/12 1805      Chief Complaint  Patient presents with  . Abscess    (Consider location/radiation/quality/duration/timing/severity/associated sxs/prior treatment) Patient is a 26 y.o. female presenting with abscess. The history is provided by the patient.  Abscess  This is a new problem. The current episode started more than one week ago. The problem occurs continuously. The problem has been gradually worsening. The problem is moderate. Pertinent negatives include no fever. Associated symptoms comments: Painful, non-draining area to buttock similar to previous cutaneous abscesses. No fever. No increased pain with bowel movement..    No past medical history on file.  No past surgical history on file.  Family History  Problem Relation Age of Onset  . Hypertension Maternal Grandmother   . Diabetes Maternal Grandmother   . Hypertension Paternal Grandmother   . Diabetes Paternal Grandmother     History  Substance Use Topics  . Smoking status: Never Smoker   . Smokeless tobacco: Not on file  . Alcohol Use: No    OB History    Grav Para Term Preterm Abortions TAB SAB Ect Mult Living   4 3 3  1 1    3       Review of Systems  Constitutional: Negative for fever and chills.  Gastrointestinal: Negative.  Negative for nausea.  Skin:       C/O Abscess.  Neurological: Negative.  Negative for numbness.    Allergies  Review of patient's allergies indicates no known allergies.  Home Medications   Current Outpatient Rx  Name  Route  Sig  Dispense  Refill  . ACETAMINOPHEN 500 MG PO TABS   Oral   Take 1,000 mg by mouth every 6 (six) hours as needed. For pain           BP 111/49  Pulse 117  Temp 98.7 F (37.1 C) (Oral)  Resp 20  SpO2 100%  Physical Exam  Constitutional: She is oriented to person, place, and time. She appears well-developed and  well-nourished.  Neck: Normal range of motion.  Pulmonary/Chest: Effort normal.  Neurological: She is alert and oriented to person, place, and time.  Skin: Skin is warm and dry.       Left buttock large area redness and induration with central area fluctuance c/w abscess.  No perianal pain.    ED Course  Procedures (including critical care time)  Labs Reviewed - No data to display No results found.  INCISION AND DRAINAGE Performed by: Langley Adie A Consent: Verbal consent obtained. Risks and benefits: risks, benefits and alternatives were discussed Type: abscess  Body area: left buttock  Anesthesia: local infiltration  Incision was made with a scalpel.  Local anesthetic: lidocaine 1% w/ epinephrine  Anesthetic total: 3 ml  Complexity: complex #11 blade used for incision Blunt dissection to break up loculations  Drainage: purulent  Drainage amount: moderate  Packing material: 1/2 in iodoform gauze  Patient tolerance: Patient tolerated the procedure well with no immediate complications.    No diagnosis found.  1. Cutaneous abscess  MDM  Uncomplicated cutaneous abscess requiring I&D.        Rodena Medin, PA-C 06/02/12 1959

## 2012-06-02 NOTE — ED Notes (Signed)
Pt has abscess on left sided buttock x 2 weeks. Pt unable to sit on area. No drainage noted.

## 2012-06-02 NOTE — ED Notes (Signed)
Pt verbalizes understanding 

## 2012-06-02 NOTE — ED Provider Notes (Signed)
Medical screening examination/treatment/procedure(s) were performed by non-physician practitioner and as supervising physician I was immediately available for consultation/collaboration.  Martha K Linker, MD 06/02/12 2024 

## 2012-06-04 ENCOUNTER — Emergency Department (HOSPITAL_COMMUNITY)
Admission: EM | Admit: 2012-06-04 | Discharge: 2012-06-04 | Disposition: A | Discharge status: Home / Self Care | Payer: Medicaid Other | Attending: Emergency Medicine | Admitting: Emergency Medicine

## 2012-06-04 ENCOUNTER — Encounter (HOSPITAL_COMMUNITY): Payer: Self-pay | Admitting: Emergency Medicine

## 2012-06-04 DIAGNOSIS — L0231 Cutaneous abscess of buttock: Secondary | ICD-10-CM | POA: Insufficient documentation

## 2012-06-04 NOTE — ED Notes (Signed)
Pt reports additional swelling next to area currently treated on l/buttocks. Area hard, red raised and painful to touch. Skin warm

## 2012-06-04 NOTE — ED Notes (Signed)
Packing of abscess post I and D removed from l/buttock by PA

## 2012-06-04 NOTE — ED Provider Notes (Signed)
Medical screening examination/treatment/procedure(s) were performed by non-physician practitioner and as supervising physician I was immediately available for consultation/collaboration.  Gerhard Munch, MD 06/04/12 [phone]

## 2012-06-04 NOTE — ED Provider Notes (Signed)
History   This chart was scribed for non-physician practitioner working with Gerhard Munch, MD by Charolett Bumpers, ED Scribe. This patient was seen in room WTR8/WTR8 and the patient's care was started at 1430.   CSN: [id number]  Arrival date & time 06/04/12  1317   First MD Initiated Contact with Patient 06/04/12 1430     CC: Packing removal  The history is provided by the patient. No language interpreter was used.   Brenda Tyler is a 26 y.o. female who presents to the Emergency Department complaining of a moderate abscess to her left buttocks. She states she was seen 2 days ago in which the abscess was lanced and packing was placed. She reports associated pain. She states she was placed on antibiotics which have improved her symptoms significantly. She states she has a h/o abscesses. She is here for packing removal.   No past medical history on file.  No past surgical history on file.  Family History  Problem Relation Age of Onset  . Hypertension Maternal Grandmother   . Diabetes Maternal Grandmother   . Hypertension Paternal Grandmother   . Diabetes Paternal Grandmother     History  Substance Use Topics  . Smoking status: Never Smoker   . Smokeless tobacco: Not on file  . Alcohol Use: No    OB History    Grav Para Term Preterm Abortions TAB SAB Ect Mult Living   4 3 3  1 1    3       Review of Systems All other systems negative except as documented in the HPI. All pertinent positives and negatives as reviewed in the HPI.   Allergies  Review of patient's allergies indicates no known allergies.  Home Medications   Current Outpatient Rx  Name  Route  Sig  Dispense  Refill  . OXYCODONE-ACETAMINOPHEN 5-325 MG PO TABS   Oral   Take 1 tablet by mouth every 4 (four) hours as needed for pain.   15 tablet   0   . SULFAMETHOXAZOLE-TRIMETHOPRIM 800-160 MG PO TABS   Oral   Take 1 tablet by mouth every 12 (twelve) hours.   10 tablet   0     There were  no vitals taken for this visit.  Physical Exam  Nursing note and vitals reviewed. Constitutional: She is oriented to person, place, and time. She appears well-developed and well-nourished. No distress.  HENT:  Head: Normocephalic and atraumatic.  Eyes: EOM are normal.  Neck: Neck supple. No tracheal deviation present.  Cardiovascular: Normal rate.   Pulmonary/Chest: Effort normal. No respiratory distress.  Musculoskeletal: Normal range of motion.  Neurological: She is alert and oriented to person, place, and time.  Skin: Skin is warm and dry.       Abscess to the left medial buttocks with drainage. Purulent material noted.   Psychiatric: She has a normal mood and affect. Her behavior is normal.    ED Course  Procedures (including critical care time)  COORDINATION OF CARE:  14:50-Discussed planned course of treatment with the patient, who is agreeable at this time.   The patient is advised to return here as needed. Keep area clean and dry. Keep area covered.     MDM  I personally performed the services described in this documentation, which was scribed in my presence. The recorded information has been reviewed and is accurate.      Carlyle Dolly, PA-C 06/04/12 1501

## 2012-06-05 LAB — CULTURE, ROUTINE-ABSCESS

## 2012-06-06 NOTE — ED Notes (Signed)
+   Urine Patient treated with Septra-sensitive to same-chart appended per protocol MD. 

## 2012-06-06 NOTE — Progress Notes (Signed)
Pt listed with no insurance coverage CM and [facility] liaison spoke with her on 06/04/12 Pt offered services to assist with finding a [place] self pay provider & health reform information

## 2012-09-29 ENCOUNTER — Encounter (HOSPITAL_COMMUNITY): Payer: Self-pay | Admitting: *Deleted

## 2012-09-29 DIAGNOSIS — N61 Mastitis without abscess: Secondary | ICD-10-CM | POA: Insufficient documentation

## 2012-09-29 NOTE — ED Notes (Signed)
Pt c/o "knot" under her right breast x 3 days.  States it causes a "throbbing pain".  Denies n/v, fevers.

## 2012-09-30 ENCOUNTER — Emergency Department (HOSPITAL_COMMUNITY)
Admission: EM | Admit: 2012-09-30 | Discharge: 2012-09-30 | Disposition: A | Discharge status: Home / Self Care | Payer: Medicaid Other | Attending: Emergency Medicine | Admitting: Emergency Medicine

## 2012-09-30 DIAGNOSIS — N611 Abscess of the breast and nipple: Secondary | ICD-10-CM

## 2012-09-30 MED ORDER — CLINDAMYCIN HCL 300 MG PO CAPS: 300.0000 mg | ORAL_CAPSULE | Freq: Four times a day (QID) | ORAL | Status: DC

## 2012-09-30 MED ORDER — HYDROCODONE-ACETAMINOPHEN 5-325 MG PO TABS: 1.0000 | ORAL_TABLET | ORAL | Status: DC | PRN

## 2012-09-30 MED ORDER — HYDROCODONE-ACETAMINOPHEN 5-325 MG PO TABS
1.0000 | ORAL_TABLET | Freq: Once | ORAL | Status: AC
  Administered 2012-09-30: 1 via ORAL
  Filled 2012-09-30: qty 1

## 2012-09-30 NOTE — ED Provider Notes (Signed)
History     CSN: [id number]  Arrival date & time 09/29/12  2334   First MD Initiated Contact with Patient 09/30/12 0038      Chief Complaint  Patient presents with  . Abscess   HPI  History provided by the patient. Patient is a 27 year old female with no significant PMH who presents with complaints of redness and swelling with pain to the right breasts. Symptoms first began with a small little not 3 days ago. Area has become larger and more painful. Symptoms are worse with any palpation. Patient has used some ibuprofen at home for pain without significant relief. Denies any other aggravating or alleviating factors. Denies having similar symptoms previously. Denies any associated fever, chills or sweats. No bleeding or drainage from the nipple.    History reviewed. No pertinent past medical history.  History reviewed. No pertinent past surgical history.  Family History  Problem Relation Age of Onset  . Hypertension Maternal Grandmother   . Diabetes Maternal Grandmother   . Hypertension Paternal Grandmother   . Diabetes Paternal Grandmother     History  Substance Use Topics  . Smoking status: Never Smoker   . Smokeless tobacco: Not on file  . Alcohol Use: No    OB History   Grav Para Term Preterm Abortions TAB SAB Ect Mult Living   4 3 3  1 1    3       Review of Systems  Constitutional: Negative for fever and chills.  All other systems reviewed and are negative.    Allergies  Review of patient's allergies indicates no known allergies.  Home Medications   Current Outpatient Rx  Name  Route  Sig  Dispense  Refill  . oxyCODONE-acetaminophen (PERCOCET/ROXICET) 5-325 MG per tablet   Oral   Take 1 tablet by mouth every 4 (four) hours as needed for pain.   15 tablet   0   . sulfamethoxazole-trimethoprim (SEPTRA DS) 800-160 MG per tablet   Oral   Take 1 tablet by mouth every 12 (twelve) hours.   10 tablet   0     BP 123/87  Pulse 69  Temp(Src) 97 F (36.1  C) (Oral)  Resp 16  SpO2 98%  Physical Exam  Nursing note and vitals reviewed. Constitutional: She is oriented to person, place, and time. She appears well-developed and well-nourished. No distress.  HENT:  Head: Normocephalic.  Cardiovascular: Normal rate and regular rhythm.   Pulmonary/Chest: Effort normal and breath sounds normal. No respiratory distress.  Abdominal: Soft.  Musculoskeletal: Normal range of motion.  Neurological: She is alert and oriented to person, place, and time.  Skin: Skin is warm and dry. No rash noted.  2.5 cm erythematous nodule to the medial inferior right breast with surrounding erythema and induration approximately 5 cm circular area. Mild fluctuance. No bleeding or drainage.  Bedside ultrasound used to confirm abscess.  Psychiatric: She has a normal mood and affect. Her behavior is normal.    ED Course  Procedures   INCISION AND DRAINAGE Performed by: Angus Seller Consent: Verbal consent obtained. Risks and benefits: risks, benefits and alternatives were discussed Type: abscess  Body area: Right inferior medial breast  Anesthesia: local infiltration  Incision was made with a scalpel.  Local anesthetic: lidocaine 2% with epinephrine  Anesthetic total: 2 ml  Complexity: complex Blunt dissection to break up loculations  Drainage: purulent  Drainage amount: Small   Packing material: None   Patient tolerance: Patient tolerated the procedure well with  no immediate complications.       1. Abscess of right breast       MDM  Patient seen and evaluated. Patient well-appearing in no acute distress. Patient does not appear severely ill or toxic.      Angus Seller, PA-C 09/30/12 2134

## 2012-10-01 NOTE — ED Provider Notes (Signed)
Medical screening examination/treatment/procedure(s) were performed by non-physician practitioner and as supervising physician I was immediately available for consultation/collaboration.  Sunnie Nielsen, MD 10/01/12 [phone]

## 2012-10-18 ENCOUNTER — Encounter (HOSPITAL_COMMUNITY): Payer: Self-pay | Admitting: Emergency Medicine

## 2012-10-18 ENCOUNTER — Emergency Department (HOSPITAL_COMMUNITY)
Admission: EM | Admit: 2012-10-18 | Discharge: 2012-10-18 | Disposition: A | Discharge status: Home / Self Care | Payer: Medicaid Other | Source: Home / Self Care

## 2012-10-18 DIAGNOSIS — B009 Herpesviral infection, unspecified: Secondary | ICD-10-CM

## 2012-10-18 DIAGNOSIS — B001 Herpesviral vesicular dermatitis: Secondary | ICD-10-CM

## 2012-10-18 MED ORDER — VALACYCLOVIR HCL 1 G PO TABS: 1000.0000 mg | ORAL_TABLET | Freq: Three times a day (TID) | ORAL | Status: AC

## 2012-10-18 NOTE — ED Provider Notes (Signed)
Medical screening examination/treatment/procedure(s) were performed by resident physician or non-physician practitioner and as supervising physician I was immediately available for consultation/collaboration.   Vernis [place]acungan DOUGLAS MD.   Tyde Lamison D Liadan Guizar, MD 10/18/12 1724 

## 2012-10-18 NOTE — ED Notes (Addendum)
Pt c/o a blister on top lip onset 2 days Pain and some drainage associated w/blister; also reporting some nasal congestion Usually gets them freq Denies: f/v/n/d BP today was 181/91; denies: CP, HA, SOB, edema, blurry vision Taking antibiotics for an abscess that she does not remember the name of .   She is alert and oriented w/no signs of acute distress.

## 2012-10-18 NOTE — ED Provider Notes (Signed)
History     CSN: [id number]  Arrival date & time 10/18/12  1411   None     Chief Complaint  Patient presents with  . Blister    (Consider location/radiation/quality/duration/timing/severity/associated sxs/prior treatment) HPI Comments: 27 year old obese female presents with complaint of fever blister for 2-3 days. States this occurs quite frequently and in the same location. She is complaining of pain and tenderness in the upper left lip. She denies fever, chills or other constitutional symptoms.   History reviewed. No pertinent past medical history.  History reviewed. No pertinent past surgical history.  Family History  Problem Relation Age of Onset  . Hypertension Maternal Grandmother   . Diabetes Maternal Grandmother   . Hypertension Paternal Grandmother   . Diabetes Paternal Grandmother     History  Substance Use Topics  . Smoking status: Never Smoker   . Smokeless tobacco: Not on file  . Alcohol Use: No    OB History   Grav Para Term Preterm Abortions TAB SAB Ect Mult Living   4 3 3  1 1    3       Review of Systems  All other systems reviewed and are negative.    Allergies  Review of patient's allergies indicates no known allergies.  Home Medications   Current Outpatient Rx  Name  Route  Sig  Dispense  Refill  . clindamycin (CLEOCIN) 300 MG capsule   Oral   Take 1 capsule (300 mg total) by mouth 4 (four) times daily. X 7 days   28 capsule   0   . HYDROcodone-acetaminophen (NORCO) 5-325 MG per tablet   Oral   Take 1 tablet by mouth every 4 (four) hours as needed for pain.   10 tablet   0   . oxyCODONE-acetaminophen (PERCOCET/ROXICET) 5-325 MG per tablet   Oral   Take 1 tablet by mouth every 4 (four) hours as needed for pain.   15 tablet   0   . sulfamethoxazole-trimethoprim (SEPTRA DS) 800-160 MG per tablet   Oral   Take 1 tablet by mouth every 12 (twelve) hours.   10 tablet   0   . valACYclovir (VALTREX) 1000 MG tablet   Oral  Take 1 tablet (1,000 mg total) by mouth 3 (three) times daily.   21 tablet   0     BP 181/91  Pulse 68  Temp(Src) 97.7 F (36.5 C) (Oral)  Resp 18  SpO2 100%  Physical Exam  Constitutional: She is oriented to person, place, and time. She appears well-developed and well-nourished. No distress.  HENT:  Head: Normocephalic and atraumatic.  Mouth/Throat: Oropharynx is clear and moist. No oropharyngeal exudate.  Upper lip left side with raised, tender lesion with scaling and crusting. No swelling or erythema to the surrounding healthy tissue.  Pulmonary/Chest: Effort normal.  Lymphadenopathy:    She has no cervical adenopathy.  Neurological: She is alert and oriented to person, place, and time. She exhibits normal muscle tone.  Skin: Skin is warm and dry. No rash noted.  Psychiatric: She has a normal mood and affect.    ED Course  Procedures (including critical care time)  Labs Reviewed - No data to display No results found.   1. Herpes simplex labialis       MDM  Without 1000 mg 3 times a day for 7 days Recommend to establish herself with the physician written on her Medicaid card. 20 to followup for health maintenance and illnesses and preventive health.  Hayden Rasmussen, NP 10/18/12 1553

## 2012-11-20 ENCOUNTER — Inpatient Hospital Stay (HOSPITAL_COMMUNITY)
Admission: AD | Admit: 2012-11-20 | Discharge: 2012-11-20 | Disposition: A | Discharge status: Home / Self Care | Payer: Medicaid Other | Source: Ambulatory Visit | Attending: Family Medicine | Admitting: Family Medicine

## 2012-11-20 ENCOUNTER — Encounter (HOSPITAL_COMMUNITY): Payer: Self-pay

## 2012-11-20 DIAGNOSIS — B9689 Other specified bacterial agents as the cause of diseases classified elsewhere: Secondary | ICD-10-CM | POA: Insufficient documentation

## 2012-11-20 DIAGNOSIS — N911 Secondary amenorrhea: Secondary | ICD-10-CM

## 2012-11-20 DIAGNOSIS — N912 Amenorrhea, unspecified: Secondary | ICD-10-CM | POA: Insufficient documentation

## 2012-11-20 DIAGNOSIS — R109 Unspecified abdominal pain: Secondary | ICD-10-CM | POA: Insufficient documentation

## 2012-11-20 DIAGNOSIS — N760 Acute vaginitis: Secondary | ICD-10-CM

## 2012-11-20 DIAGNOSIS — A499 Bacterial infection, unspecified: Secondary | ICD-10-CM | POA: Insufficient documentation

## 2012-11-20 HISTORY — DX: Herpesviral infection, unspecified: B00.9

## 2012-11-20 LAB — URINALYSIS, ROUTINE W REFLEX MICROSCOPIC
Bilirubin Urine: NEGATIVE
Protein, ur: NEGATIVE mg/dL
Urobilinogen, UA: 0.2 mg/dL (ref 0.0–1.0)

## 2012-11-20 LAB — POCT PREGNANCY, URINE: Preg Test, Ur: NEGATIVE

## 2012-11-20 LAB — URINE MICROSCOPIC-ADD ON

## 2012-11-20 MED ORDER — METRONIDAZOLE 500 MG PO TABS: 500.0000 mg | ORAL_TABLET | Freq: Two times a day (BID) | ORAL | Status: DC

## 2012-11-20 NOTE — MAU Provider Note (Signed)
Chart reviewed and agree with management and plan.  

## 2012-11-20 NOTE — MAU Provider Note (Signed)
History     CSN: [id number]  Arrival date and time: 11/20/12 0945   None     Chief Complaint  Patient presents with  . Abdominal Pain   HPI  Brenda Tyler is a 27 y.o. female [phone] who presents today stating that she has not had a period since last fall.  She states she has a PMH significant for menstrual irregularities.  She does not see OBGYN regularly and denies surgery on her abdomen or female organs.  She denies any discharge.  She states she has had a STI in the past, but that this was in 2007 and doesn't remember what this disease was or what the treatment was.  She denies any cramping or other pain. Her last pregnancy delivered vaginally in 2010 and that her period returned to normal. She was on Depo from 2010 to 2011 and once she was off the shot, her periods returned lighter, shorter, and intermittent.  She states that she has been sexually active with her partner of 3 years without protection and hopes to one day have another child. She has had a MRSA infected boil 2 months ago (contact precautions); she denies fevers or chills.    OB History   Grav Para Term Preterm Abortions TAB SAB Ect Mult Living   4 3 3  1 1    3       Past Medical History  Diagnosis Date  . HSV (herpes simplex virus) infection     Past Surgical History  Procedure Laterality Date  . Incision and drainage abscess      on abdomen and buttocks    Family History  Problem Relation Age of Onset  . Hypertension Maternal Grandmother   . Diabetes Maternal Grandmother   . Heart disease Maternal Grandmother   . Cancer Maternal Grandmother   . Hypertension Paternal Grandmother   . Diabetes Paternal Grandmother   . Heart disease Paternal Grandmother     History  Substance Use Topics  . Smoking status: Never Smoker   . Smokeless tobacco: Never Used  . Alcohol Use: Yes    Allergies: No Known Allergies  No prescriptions prior to admission    Review of Systems  Constitutional: Negative for  fever and chills.  HENT: Negative for ear pain, tinnitus and ear discharge.   Eyes: Negative for blurred vision, double vision and pain.  Respiratory: Negative for cough, hemoptysis, sputum production, shortness of breath and wheezing.   Cardiovascular: Negative for chest pain, palpitations and claudication.  Gastrointestinal: Positive for abdominal pain. Negative for heartburn, nausea, vomiting, diarrhea, constipation and blood in stool.  Genitourinary: Negative for dysuria, urgency, frequency and hematuria.  Neurological: Positive for headaches. Negative for dizziness, tingling and weakness.  Endo/Heme/Allergies: Does not bruise/bleed easily.   Physical Exam   Blood pressure 114/81, pulse 71, temperature 97.3 F (36.3 C), temperature source Oral, resp. rate 16, height 5' 3.75" (1.619 m), weight 120.657 kg (266 lb).  Physical Exam  Constitutional: She is oriented to person, place, and time. She appears well-developed.  Patient is morbidly obese.  She has hirsutism.    HENT:  Head: Normocephalic.  Eyes: Pupils are equal, round, and reactive to light.  Cardiovascular: Normal rate and regular rhythm.  Exam reveals no gallop and no friction rub.   No murmur heard. Respiratory: Effort normal and breath sounds normal. She has no wheezes. She has no rales.  GI: Soft. Bowel sounds are normal. She exhibits no distension. There is no tenderness.  Genitourinary:  Uterus normal. There is no rash, tenderness or lesion on the right labia. There is no rash, tenderness or lesion on the left labia. Uterus is not deviated, not enlarged, not fixed and not tender. Cervix exhibits no friability. Right adnexum displays no mass and no tenderness. Left adnexum displays no mass and no tenderness. No erythema or tenderness around the vagina. No signs of injury around the vagina. Vaginal discharge found.  Neurological: She is alert and oriented to person, place, and time.    MAU Course  Procedures  Results for  orders placed during the hospital encounter of 11/20/12 (from the past 24 hour(s))  URINALYSIS, ROUTINE W REFLEX MICROSCOPIC     Status: Abnormal   Collection Time    11/20/12 10:20 AM      Result Value Range   Color, Urine YELLOW  YELLOW   APPearance CLOUDY (*) CLEAR   Specific Gravity, Urine 1.020  1.005 - 1.030   pH 6.5  5.0 - 8.0   Glucose, UA NEGATIVE  NEGATIVE mg/dL   Hgb urine dipstick SMALL (*) NEGATIVE   Bilirubin Urine NEGATIVE  NEGATIVE   Ketones, ur NEGATIVE  NEGATIVE mg/dL   Protein, ur NEGATIVE  NEGATIVE mg/dL   Urobilinogen, UA 0.2  0.0 - 1.0 mg/dL   Nitrite NEGATIVE  NEGATIVE   Leukocytes, UA NEGATIVE  NEGATIVE  URINE MICROSCOPIC-ADD ON     Status: Abnormal   Collection Time    11/20/12 10:20 AM      Result Value Range   Squamous Epithelial / LPF MANY (*) RARE   WBC, UA 3-6  <3 WBC/hpf   RBC / HPF 0-2  <3 RBC/hpf   Bacteria, UA FEW (*) RARE  POCT PREGNANCY, URINE     Status: None   Collection Time    11/20/12 10:21 AM      Result Value Range   Preg Test, Ur NEGATIVE  NEGATIVE  WET PREP, GENITAL     Status: Abnormal   Collection Time    11/20/12 12:40 PM      Result Value Range   Yeast Wet Prep HPF POC NONE SEEN  NONE SEEN   Trich, Wet Prep NONE SEEN  NONE SEEN   Clue Cells Wet Prep HPF POC FEW (*) NONE SEEN   WBC, Wet Prep HPF POC FEW (*) NONE SEEN   GC/chlamydia pending  Assessment and Plan  A:  1) Secondary amenorrhea  2) Bacterial Vaginosis  P:  1) Will schedule appointment for patient to be worked up for secondary amenorrhea- GYNn clinic notified to schedule appt 2) Take flagyl 500 mg by mouth twice daily for 7 days.   I have seen and examined pt with Anna Genre and agree with management Anna Genre 11/20/2012, 11:31 AM

## 2012-11-20 NOTE — MAU Note (Signed)
Pt states no menses since last year sometime. Hx irregular cycles. Denies abnormal vaginal discharge. Does note issues with acid reflux/indigestion. Abd pain in upper and lower abdomen intermittently for months. Does note headache and has been feeling dizzy.

## 2012-11-21 LAB — URINE CULTURE: Colony Count: 75000

## 2012-11-21 LAB — GC/CHLAMYDIA PROBE AMP
CT Probe RNA: NEGATIVE
GC Probe RNA: NEGATIVE

## 2012-12-22 ENCOUNTER — Other Ambulatory Visit (HOSPITAL_COMMUNITY)
Admission: RE | Admit: 2012-12-22 | Discharge: 2012-12-22 | Disposition: A | Discharge status: Home / Self Care | Payer: Medicaid Other | Source: Ambulatory Visit | Attending: Obstetrics & Gynecology | Admitting: Obstetrics & Gynecology

## 2012-12-22 ENCOUNTER — Other Ambulatory Visit: Payer: Self-pay | Admitting: Obstetrics & Gynecology

## 2012-12-22 ENCOUNTER — Encounter: Payer: Medicaid Other | Admitting: Obstetrics and Gynecology

## 2012-12-22 ENCOUNTER — Ambulatory Visit (INDEPENDENT_AMBULATORY_CARE_PROVIDER_SITE_OTHER): Payer: Medicaid Other | Admitting: Obstetrics & Gynecology

## 2012-12-22 ENCOUNTER — Encounter: Payer: Self-pay | Admitting: Obstetrics & Gynecology

## 2012-12-22 VITALS — BP 109/75 | HR 87 | Temp 98.0°F | Ht 64.0 in | Wt 263.7 lb

## 2012-12-22 DIAGNOSIS — R87613 High grade squamous intraepithelial lesion on cytologic smear of cervix (HGSIL): Secondary | ICD-10-CM | POA: Insufficient documentation

## 2012-12-22 DIAGNOSIS — Z01419 Encounter for gynecological examination (general) (routine) without abnormal findings: Secondary | ICD-10-CM

## 2012-12-22 DIAGNOSIS — N912 Amenorrhea, unspecified: Secondary | ICD-10-CM

## 2012-12-22 DIAGNOSIS — N911 Secondary amenorrhea: Secondary | ICD-10-CM | POA: Insufficient documentation

## 2012-12-22 DIAGNOSIS — E669 Obesity, unspecified: Secondary | ICD-10-CM

## 2012-12-22 DIAGNOSIS — Z113 Encounter for screening for infections with a predominantly sexual mode of transmission: Secondary | ICD-10-CM | POA: Insufficient documentation

## 2012-12-22 HISTORY — DX: High grade squamous intraepithelial lesion on cytologic smear of cervix (HGSIL): R87.613

## 2012-12-22 LAB — CBC
HCT: 36.1 % (ref 36.0–46.0)
Hemoglobin: 12.1 g/dL (ref 12.0–15.0)
WBC: 3.2 10*3/uL — ABNORMAL LOW (ref 4.0–10.5)

## 2012-12-22 MED ORDER — MEDROXYPROGESTERONE ACETATE 10 MG PO TABS: 10.0000 mg | ORAL_TABLET | Freq: Every day | ORAL | Status: DC

## 2012-12-22 NOTE — Patient Instructions (Signed)
Secondary Amenorrhea  Secondary amenorrhea is the stopping of menstrual flow for 3 to 6 months in a female who has previously had periods. There are many possible causes. Most of these causes are not serious. Usually treating the underlying problem causing the loss of menses will return your periods to normal. CAUSES  Some common and uncommon causes of not menstruating include:  Malnutrition.  Low blood sugar (hypoglycemia).  Polycystic ovarian disease.  Stress or fear.  Breastfeeding.  Hormone imbalance.  Ovarian failure.  Medications.  Extreme obesity.  Cystic fibrosis.  Low body weight or drastic weight reduction from any cause.  Early menopause.  Removal of ovaries or uterus.  Contraceptives.  Illness.  Long term (chronic) illnesses.  Cushing's syndrome.  Thyroid problems.  Birth control pills, patches, or vaginal rings for birth control. DIAGNOSIS  This diagnosis is made by your caregiver taking a medical history and doing a physical exam. Pregnancy must be ruled out. Often times, numerous blood tests of different hormones in the body may be measured. Urine testing may be done. Specialized x-rays may have to be done as well as measuring the body mass index (BMI). TREATMENT  Treatment depends on the cause of the amenorrhea. If an eating disorder is present, this can be treated with an adequate diet and therapy. Chronic illnesses may improve with treatment of the illness. Overall, the outlook is good. The amenorrhea may be corrected with medications, lifestyle changes, or surgery. If the amenorrhea cannot be corrected, it is sometimes possible to create a false menstruation with medications. Document Released: 07/23/2006 Document Revised: 09/03/2011 Document Reviewed: 05/30/2007 [facility] Patient Information 2014 [place], [place].  Preventive Care for Adults, Female A healthy lifestyle and preventive care can promote health and wellness. Preventive health  guidelines for women include the following key practices.  A routine yearly physical is a good way to check with your caregiver about your health and preventive screening. It is a chance to share any concerns and updates on your health, and to receive a thorough exam.  Visit your dentist for a routine exam and preventive care every 6 months. Brush your teeth twice a day and floss once a day. Good oral hygiene prevents tooth decay and gum disease.  The frequency of eye exams is based on your age, health, family medical history, use of contact lenses, and other factors. Follow your caregiver's recommendations for frequency of eye exams.  Eat a healthy diet. Foods like vegetables, fruits, whole grains, low-fat dairy products, and lean protein foods contain the nutrients you need without too many calories. Decrease your intake of foods high in solid fats, added sugars, and salt. Eat the right amount of calories for you.Get information about a proper diet from your caregiver, if necessary.  Regular physical exercise is one of the most important things you can do for your health. Most adults should get at least 150 minutes of moderate-intensity exercise (any activity that increases your heart rate and causes you to sweat) each week. In addition, most adults need muscle-strengthening exercises on 2 or more days a week.  Maintain a healthy weight. The body mass index (BMI) is a screening tool to identify possible weight problems. It provides an estimate of body fat based on height and weight. Your caregiver can help determine your BMI, and can help you achieve or maintain a healthy weight.For adults 20 years and older:  A BMI below 18.5 is considered underweight.  A BMI of 18.5 to 24.9 is normal.  A BMI  of 25 to 29.9 is considered overweight.  A BMI of 30 and above is considered obese.  Maintain normal blood lipids and cholesterol levels by exercising and minimizing your intake of saturated fat. Eat  a balanced diet with plenty of fruit and vegetables. Blood tests for lipids and cholesterol should begin at age 10 and be repeated every 5 years. If your lipid or cholesterol levels are high, you are over 50, or you are at high risk for heart disease, you may need your cholesterol levels checked more frequently.Ongoing high lipid and cholesterol levels should be treated with medicines if diet and exercise are not effective.  If you smoke, find out from your caregiver how to quit. If you do not use tobacco, do not start.  If you are pregnant, do not drink alcohol. If you are breastfeeding, be very cautious about drinking alcohol. If you are not pregnant and choose to drink alcohol, do not exceed 1 drink per day. One drink is considered to be 12 ounces (355 mL) of beer, 5 ounces (148 mL) of wine, or 1.5 ounces (44 mL) of liquor.  Avoid use of street drugs. Do not share needles with anyone. Ask for help if you need support or instructions about stopping the use of drugs.  High blood pressure causes heart disease and increases the risk of stroke. Your blood pressure should be checked at least every 1 to 2 years. Ongoing high blood pressure should be treated with medicines if weight loss and exercise are not effective.  If you are 65 to 28 years old, ask your caregiver if you should take aspirin to prevent strokes.  Diabetes screening involves taking a blood sample to check your fasting blood sugar level. This should be done once every 3 years, after age 26, if you are within normal weight and without risk factors for diabetes. Testing should be considered at a younger age or be carried out more frequently if you are overweight and have at least 1 risk factor for diabetes.  Breast cancer screening is essential preventive care for women. You should practice "breast self-awareness." This means understanding the normal appearance and feel of your breasts and may include breast self-examination. Any changes  detected, no matter how small, should be reported to a caregiver. Women in their 76s and 30s should have a clinical breast exam (CBE) by a caregiver as part of a regular health exam every 1 to 3 years. After age 51, women should have a CBE every year. Starting at age 59, women should consider having a mammography (breast X-ray test) every year. Women who have a family history of breast cancer should talk to their caregiver about genetic screening. Women at a high risk of breast cancer should talk to their caregivers about having magnetic resonance imaging (MRI) and a mammography every year.  The Pap test is a screening test for cervical cancer. A Pap test can show cell changes on the cervix that might become cervical cancer if left untreated. A Pap test is a procedure in which cells are obtained and examined from the lower end of the uterus (cervix).  Women should have a Pap test starting at age 9.  Between ages 20 and 57, Pap tests should be repeated every 2 years.  Beginning at age 58, you should have a Pap test every 3 years as long as the past 3 Pap tests have been normal.  Some women have medical problems that increase the chance of getting cervical cancer. Talk  to your caregiver about these problems. It is especially important to talk to your caregiver if a new problem develops soon after your last Pap test. In these cases, your caregiver may recommend more frequent screening and Pap tests.  The above recommendations are the same for women who have or have not gotten the vaccine for human papillomavirus (HPV).  If you had a hysterectomy for a problem that was not cancer or a condition that could lead to cancer, then you no longer need Pap tests. Even if you no longer need a Pap test, a regular exam is a good idea to make sure no other problems are starting.  If you are between ages 21 and 38, and you have had normal Pap tests going back 10 years, you no longer need Pap tests. Even if you no  longer need a Pap test, a regular exam is a good idea to make sure no other problems are starting.  If you have had past treatment for cervical cancer or a condition that could lead to cancer, you need Pap tests and screening for cancer for at least 20 years after your treatment.  If Pap tests have been discontinued, risk factors (such as a new sexual partner) need to be reassessed to determine if screening should be resumed.  The HPV test is an additional test that may be used for cervical cancer screening. The HPV test looks for the virus that can cause the cell changes on the cervix. The cells collected during the Pap test can be tested for HPV. The HPV test could be used to screen women aged 62 years and older, and should be used in women of any age who have unclear Pap test results. After the age of 76, women should have HPV testing at the same frequency as a Pap test.  Colorectal cancer can be detected and often prevented. Most routine colorectal cancer screening begins at the age of 35 and continues through age 62. However, your caregiver may recommend screening at an earlier age if you have risk factors for colon cancer. On a yearly basis, your caregiver may provide home test kits to check for hidden blood in the stool. Use of a small camera at the end of a tube, to directly examine the colon (sigmoidoscopy or colonoscopy), can detect the earliest forms of colorectal cancer. Talk to your caregiver about this at age 81, when routine screening begins. Direct examination of the colon should be repeated every 5 to 10 years through age 38, unless early forms of pre-cancerous polyps or small growths are found.  Hepatitis C blood testing is recommended for all people born from [birth year] through 1965 and any individual with known risks for hepatitis C.  Practice safe sex. Use condoms and avoid high-risk sexual practices to reduce the spread of sexually transmitted infections (STIs). STIs include gonorrhea,  chlamydia, syphilis, trichomonas, herpes, HPV, and human immunodeficiency virus (HIV). Herpes, HIV, and HPV are viral illnesses that have no cure. They can result in disability, cancer, and death. Sexually active women aged 46 and younger should be checked for chlamydia. Older women with new or multiple partners should also be tested for chlamydia. Testing for other STIs is recommended if you are sexually active and at increased risk.  Osteoporosis is a disease in which the bones lose minerals and strength with aging. This can result in serious bone fractures. The risk of osteoporosis can be identified using a bone density scan. Women ages 38 and over and  women at risk for fractures or osteoporosis should discuss screening with their caregivers. Ask your caregiver whether you should take a calcium supplement or vitamin D to reduce the rate of osteoporosis.  Menopause can be associated with physical symptoms and risks. Hormone replacement therapy is available to decrease symptoms and risks. You should talk to your caregiver about whether hormone replacement therapy is right for you.  Use sunscreen with sun protection factor (SPF) of 30 or more. Apply sunscreen liberally and repeatedly throughout the day. You should seek shade when your shadow is shorter than you. Protect yourself by wearing long sleeves, pants, a wide-brimmed hat, and sunglasses year round, whenever you are outdoors.  Once a month, do a whole body skin exam, using a mirror to look at the skin on your back. Notify your caregiver of new moles, moles that have irregular borders, moles that are larger than a pencil eraser, or moles that have changed in shape or color.  Stay current with required immunizations.  Influenza. You need a dose every fall (or winter). The composition of the flu vaccine changes each year, so being vaccinated once is not enough.  Pneumococcal polysaccharide. You need 1 to 2 doses if you smoke cigarettes or if you  have certain chronic medical conditions. You need 1 dose at age 4 (or older) if you have never been vaccinated.  Tetanus, diphtheria, pertussis (Tdap, Td). Get 1 dose of Tdap vaccine if you are younger than age 79, are over 41 and have contact with an infant, are a Research scientist (physical sciences), are pregnant, or simply want to be protected from whooping cough. After that, you need a Td booster dose every 10 years. Consult your caregiver if you have not had at least 3 tetanus and diphtheria-containing shots sometime in your life or have a deep or dirty wound.  HPV. You need this vaccine if you are a woman age 56 or younger. The vaccine is given in 3 doses over 6 months.  Measles, mumps, rubella (MMR). You need at least 1 dose of MMR if you were born in 1957 or later. You may also need a second dose.  Meningococcal. If you are age 30 to 65 and a first-year college student living in a residence hall, or have one of several medical conditions, you need to get vaccinated against meningococcal disease. You may also need additional booster doses.  Zoster (shingles). If you are age 36 or older, you should get this vaccine.  Varicella (chickenpox). If you have never had chickenpox or you were vaccinated but received only 1 dose, talk to your caregiver to find out if you need this vaccine.  Hepatitis A. You need this vaccine if you have a specific risk factor for hepatitis A virus infection or you simply wish to be protected from this disease. The vaccine is usually given as 2 doses, 6 to 18 months apart.  Hepatitis B. You need this vaccine if you have a specific risk factor for hepatitis B virus infection or you simply wish to be protected from this disease. The vaccine is given in 3 doses, usually over 6 months. Preventive Services / Frequency Ages 72 to 12  Blood pressure check.** / Every 1 to 2 years.  Lipid and cholesterol check.** / Every 5 years beginning at age 78.  Clinical breast exam.** / Every 3 years  for women in their 64s and 30s.  Pap test.** / Every 2 years from ages 73 through 68. Every 3 years starting at age  30 through age 19 or 29 with a history of 3 consecutive normal Pap tests.  HPV screening.** / Every 3 years from ages 28 through ages 65 to 39 with a history of 3 consecutive normal Pap tests.  Hepatitis C blood test.** / For any individual with known risks for hepatitis C.  Skin self-exam. / Monthly.  Influenza immunization.** / Every year.  Pneumococcal polysaccharide immunization.** / 1 to 2 doses if you smoke cigarettes or if you have certain chronic medical conditions.  Tetanus, diphtheria, pertussis (Tdap, Td) immunization. / A one-time dose of Tdap vaccine. After that, you need a Td booster dose every 10 years.  HPV immunization. / 3 doses over 6 months, if you are 75 and younger.  Measles, mumps, rubella (MMR) immunization. / You need at least 1 dose of MMR if you were born in 1957 or later. You may also need a second dose.  Meningococcal immunization. / 1 dose if you are age 58 to 40 and a first-year college student living in a residence hall, or have one of several medical conditions, you need to get vaccinated against meningococcal disease. You may also need additional booster doses.  Varicella immunization.** / Consult your caregiver.  Hepatitis A immunization.** / Consult your caregiver. 2 doses, 6 to 18 months apart.  Hepatitis B immunization.** / Consult your caregiver. 3 doses usually over 6 months. Ages 76 to 66  Blood pressure check.** / Every 1 to 2 years.  Lipid and cholesterol check.** / Every 5 years beginning at age 9.  Clinical breast exam.** / Every year after age 55.  Mammogram.** / Every year beginning at age 28 and continuing for as long as you are in good health. Consult with your caregiver.  Pap test.** / Every 3 years starting at age 17 through age 42 or 68 with a history of 3 consecutive normal Pap tests.  HPV screening.** / Every  3 years from ages 45 through ages 72 to 24 with a history of 3 consecutive normal Pap tests.  Fecal occult blood test (FOBT) of stool. / Every year beginning at age 44 and continuing until age 79. You may not need to do this test if you get a colonoscopy every 10 years.  Flexible sigmoidoscopy or colonoscopy.** / Every 5 years for a flexible sigmoidoscopy or every 10 years for a colonoscopy beginning at age 56 and continuing until age 32.  Hepatitis C blood test.** / For all people born from [birth year] through 1966 and any individual with known risks for hepatitis C.  Skin self-exam. / Monthly.  Influenza immunization.** / Every year.  Pneumococcal polysaccharide immunization.** / 1 to 2 doses if you smoke cigarettes or if you have certain chronic medical conditions.  Tetanus, diphtheria, pertussis (Tdap, Td) immunization.** / A one-time dose of Tdap vaccine. After that, you need a Td booster dose every 10 years.  Measles, mumps, rubella (MMR) immunization. / You need at least 1 dose of MMR if you were born in 1957 or later. You may also need a second dose.  Varicella immunization.** / Consult your caregiver.  Meningococcal immunization.** / Consult your caregiver.  Hepatitis A immunization.** / Consult your caregiver. 2 doses, 6 to 18 months apart.  Hepatitis B immunization.** / Consult your caregiver. 3 doses, usually over 6 months. Ages 71 and over  Blood pressure check.** / Every 1 to 2 years.  Lipid and cholesterol check.** / Every 5 years beginning at age 87.  Clinical breast exam.** / Every year  after age 36.  Mammogram.** / Every year beginning at age 81 and continuing for as long as you are in good health. Consult with your caregiver.  Pap test.** / Every 3 years starting at age 38 through age 37 or 46 with a 3 consecutive normal Pap tests. Testing can be stopped between 65 and 70 with 3 consecutive normal Pap tests and no abnormal Pap or HPV tests in the past 10 years.  HPV  screening.** / Every 3 years from ages 31 through ages 76 or 69 with a history of 3 consecutive normal Pap tests. Testing can be stopped between 65 and 70 with 3 consecutive normal Pap tests and no abnormal Pap or HPV tests in the past 10 years.  Fecal occult blood test (FOBT) of stool. / Every year beginning at age 24 and continuing until age 2. You may not need to do this test if you get a colonoscopy every 10 years.  Flexible sigmoidoscopy or colonoscopy.** / Every 5 years for a flexible sigmoidoscopy or every 10 years for a colonoscopy beginning at age 53 and continuing until age 9.  Hepatitis C blood test.** / For all people born from [birth year] through 1966 and any individual with known risks for hepatitis C.  Osteoporosis screening.** / A one-time screening for women ages 52 and over and women at risk for fractures or osteoporosis.  Skin self-exam. / Monthly.  Influenza immunization.** / Every year.  Pneumococcal polysaccharide immunization.** / 1 dose at age 70 (or older) if you have never been vaccinated.  Tetanus, diphtheria, pertussis (Tdap, Td) immunization. / A one-time dose of Tdap vaccine if you are over 65 and have contact with an infant, are a Research scientist (physical sciences), or simply want to be protected from whooping cough. After that, you need a Td booster dose every 10 years.  Varicella immunization.** / Consult your caregiver.  Meningococcal immunization.** / Consult your caregiver.  Hepatitis A immunization.** / Consult your caregiver. 2 doses, 6 to 18 months apart.  Hepatitis B immunization.** / Check with your caregiver. 3 doses, usually over 6 months. ** Family history and personal history of risk and conditions may change your caregiver's recommendations. Document Released: 08/07/2001 Document Revised: 09/03/2011 Document Reviewed: 11/06/2010 [facility] Patient Information 2014 [place], [place].

## 2012-12-22 NOTE — Progress Notes (Signed)
  Subjective:     Brenda Tyler is a 27 y.o. [phone] female and is here for a comprehensive gynecologic physical exam. The patient reports secondary amenorrhea x 2 years.  Reports having 10-15 weight gain prior to onset of amenorrhea.  No other GYN concerns. She cannot remember the last time she had a pap smear   History   Social History  . Marital Status: Single    Spouse Name: N/A    Number of Children: N/A  . Years of Education: N/A   Occupational History  . Not on file.   Social History Main Topics  . Smoking status: Never Smoker   . Smokeless tobacco: Never Used  . Alcohol Use: Yes  . Drug Use: No  . Sexually Active: Yes    Birth Control/ Protection: None   Other Topics Concern  . Not on file   Social History Narrative  . No narrative on file   No health maintenance topics applied.  The following portions of the patient's history were reviewed and updated as appropriate: allergies, current medications, past family history, past medical history, past social history, past surgical history and problem list.  Review of Systems Pertinent items are noted in HPI.   Objective:   BP 109/75  Pulse 87  Temp(Src) 98 F (36.7 C)  Ht 5\' 4"  (1.626 m)  Wt 263 lb 11.2 oz (119.614 kg)  BMI 45.24 kg/m2 GENERAL: Well-developed, obese female in no acute distress.  HEENT: Normocephalic, atraumatic. Sclerae anicteric.  NECK: Supple. Normal thyroid.  LUNGS: Clear to auscultation bilaterally.  HEART: Regular rate and rhythm.  BREASTS: Large, symmetric in size. No masses, skin changes, nipple drainage, or lymphadenopathy.  ABDOMEN: Soft, obese, nontender, nondistended. No organomegaly palpated. PELVIC: Normal external female genitalia. Vagina is pink and rugated. Normal discharge. Normal cervix contour. Pap smear obtained. Unable to palpate uterus or adnexa secondary to obese habitus  EXTREMITIES: No cyanosis, clubbing, or edema, 2+ distal pulses   Assessment:   Annual  gynecologic exam Secondary amenorrhea Obesity   Plan:   Pap done, will follow up results and manage accordingly Routine preventative health maintenance measures emphasized Patient's amenorrhea is most likely of an anovulatory etiology.  Differential diagnoses include PCOS, thyroid dysfunction, elevated prolactin dysfunction, stress, obesity, or anything can disturb the hypothalamic-pituitary-ovarian axis.  Amenorrhea evaluation labs ordered.  Will also order pelvic ultrasound to examine her ovaries and uterus; also to rule out any structural abnormalities.  Progesterone challenge given to patient; prescribed Provera 10 mg daily x 10 days and will see if she has any bleeding.  She was told to call if she has no bleeding after one week, she will then need an estrogen-progesterone challenge or further evaluation.

## 2012-12-23 LAB — COMPREHENSIVE METABOLIC PANEL
AST: 17 U/L (ref 0–37)
Albumin: 3.8 g/dL (ref 3.5–5.2)
BUN: 10 mg/dL (ref 6–23)
Calcium: 9.4 mg/dL (ref 8.4–10.5)
Chloride: 105 mEq/L (ref 96–112)
Glucose, Bld: 84 mg/dL (ref 70–99)
Potassium: 3.7 mEq/L (ref 3.5–5.3)
Total Protein: 7.6 g/dL (ref 6.0–8.3)

## 2012-12-23 LAB — TESTOSTERONE, FREE, TOTAL, SHBG
Sex Hormone Binding: 60 nmol/L (ref 18–114)
Testosterone: 34 ng/dL (ref 10–70)

## 2012-12-23 LAB — HEMOGLOBIN A1C: Mean Plasma Glucose: 103 mg/dL (ref <117)

## 2012-12-23 LAB — ESTRADIOL: Estradiol: 41.2 pg/mL

## 2012-12-23 LAB — PROLACTIN: Prolactin: 5.6 ng/mL

## 2012-12-25 ENCOUNTER — Ambulatory Visit (HOSPITAL_COMMUNITY)
Admission: RE | Admit: 2012-12-25 | Discharge: 2012-12-25 | Disposition: A | Discharge status: Home / Self Care | Payer: Medicaid Other | Source: Ambulatory Visit | Attending: Obstetrics & Gynecology | Admitting: Obstetrics & Gynecology

## 2012-12-25 ENCOUNTER — Ambulatory Visit (HOSPITAL_COMMUNITY): Payer: Medicaid Other

## 2012-12-25 DIAGNOSIS — N911 Secondary amenorrhea: Secondary | ICD-10-CM

## 2012-12-25 DIAGNOSIS — N912 Amenorrhea, unspecified: Secondary | ICD-10-CM | POA: Insufficient documentation

## 2012-12-27 ENCOUNTER — Encounter: Payer: Self-pay | Admitting: Obstetrics & Gynecology

## 2012-12-31 ENCOUNTER — Telehealth: Payer: Self-pay | Admitting: General Practice

## 2012-12-31 NOTE — Telephone Encounter (Signed)
Patient called and left message stating she would like her test results and she was told to call us if she has not heard from [place] by Monday. Called patient back and informed her of normal blood work and ultrasound and gave results of pap smear and need for colposcopy and explained procedure to patient and informed her of 8/4 @ 1pm appt. Patient verbalized understanding to all and had no further questions

## 2012-12-31 NOTE — Telephone Encounter (Signed)
Message copied by Kathee Delton on Wed Dec 31, 2012 11:55 AM ------      Message from: Odelia Gage A      Created: Tue Dec 30, 2012  7:56 AM       Appointment is 08/04 @ 1:00      Thanks      ----- Message -----         From: Drucilla Schmidt Day, RN         Sent: 12/29/2012   8:50 AM           To: [facility] Admin Pool            Please let us know about colpo appt details- we will call pt.  Thanks.        ------

## 2013-01-26 ENCOUNTER — Encounter: Payer: Medicaid Other | Admitting: Obstetrics & Gynecology

## 2013-04-21 ENCOUNTER — Encounter (HOSPITAL_COMMUNITY): Payer: Self-pay | Admitting: Emergency Medicine

## 2013-04-21 ENCOUNTER — Emergency Department (HOSPITAL_COMMUNITY)
Admission: EM | Admit: 2013-04-21 | Discharge: 2013-04-21 | Disposition: A | Discharge status: Home / Self Care | Payer: Medicaid Other | Attending: Emergency Medicine | Admitting: Emergency Medicine

## 2013-04-21 DIAGNOSIS — B001 Herpesviral vesicular dermatitis: Secondary | ICD-10-CM

## 2013-04-21 DIAGNOSIS — Z79899 Other long term (current) drug therapy: Secondary | ICD-10-CM | POA: Insufficient documentation

## 2013-04-21 DIAGNOSIS — N912 Amenorrhea, unspecified: Secondary | ICD-10-CM | POA: Insufficient documentation

## 2013-04-21 DIAGNOSIS — Z3202 Encounter for pregnancy test, result negative: Secondary | ICD-10-CM | POA: Insufficient documentation

## 2013-04-21 DIAGNOSIS — R109 Unspecified abdominal pain: Secondary | ICD-10-CM | POA: Insufficient documentation

## 2013-04-21 DIAGNOSIS — B009 Herpesviral infection, unspecified: Secondary | ICD-10-CM | POA: Insufficient documentation

## 2013-04-21 DIAGNOSIS — R87613 High grade squamous intraepithelial lesion on cytologic smear of cervix (HGSIL): Secondary | ICD-10-CM | POA: Insufficient documentation

## 2013-04-21 DIAGNOSIS — N911 Secondary amenorrhea: Secondary | ICD-10-CM

## 2013-04-21 LAB — URINE MICROSCOPIC-ADD ON

## 2013-04-21 LAB — URINALYSIS, ROUTINE W REFLEX MICROSCOPIC
Bilirubin Urine: NEGATIVE
Glucose, UA: NEGATIVE mg/dL
Ketones, ur: NEGATIVE mg/dL
Protein, ur: NEGATIVE mg/dL

## 2013-04-21 MED ORDER — IBUPROFEN 800 MG PO TABS
800.0000 mg | ORAL_TABLET | Freq: Once | ORAL | Status: AC
  Administered 2013-04-21: 800 mg via ORAL
  Filled 2013-04-21: qty 1

## 2013-04-21 MED ORDER — VALACYCLOVIR HCL 500 MG PO TABS: 500.0000 mg | ORAL_TABLET | Freq: Two times a day (BID) | ORAL | Status: AC

## 2013-04-21 MED ORDER — IBUPROFEN 600 MG PO TABS: 600.0000 mg | ORAL_TABLET | Freq: Four times a day (QID) | ORAL | Status: DC | PRN

## 2013-04-21 NOTE — ED Notes (Signed)
Pt states that blister to lip that appeared 3 days ago with burning and dry lips. Pt reports clear to white drainage. Pt has tried heat with no relief. Pt also reports generalized abdominal pain that has been ongoing for several months. Pt states that she has not had a period in 3 years.

## 2013-04-21 NOTE — ED Provider Notes (Signed)
CSN: [id number]     Arrival date & time 04/21/13  0803 History   First MD Initiated Contact with Patient 04/21/13 [phone]     Chief Complaint  Patient presents with  . Blister   (Consider location/radiation/quality/duration/timing/severity/associated sxs/prior Treatment) HPI Comments: Patient with history of HSV presents with 3 days of upper lip swelling. Her episode began as a sensation of burning. Patient notes clear drainage. No fever, nausea or vomiting. Patient is to use warm compresses without relief. She's been given oral medications for this in the past. Patient also complains of a 3 year history of cramping abdominal pain and amenorrhea. Patient states that she's never seen a gynecologist however patient's record shows workup for amenorrhea performed 6 months ago. No N/V/D, urinary sx, vaginal d/c. The onset of this condition was acute. The course is constant. Aggravating factors: none. Alleviating factors: none.    The history is provided by the patient.    Past Medical History  Diagnosis Date  . HSV (herpes simplex virus) infection    Past Surgical History  Procedure Laterality Date  . Incision and drainage abscess      on abdomen and buttocks   Family History  Problem Relation Age of Onset  . Hypertension Maternal Grandmother   . Diabetes Maternal Grandmother   . Heart disease Maternal Grandmother   . Cancer Maternal Grandmother   . Hypertension Paternal Grandmother   . Diabetes Paternal Grandmother   . Heart disease Paternal Grandmother    History  Substance Use Topics  . Smoking status: Never Smoker   . Smokeless tobacco: Never Used  . Alcohol Use: Yes   OB History   Grav Para Term Preterm Abortions TAB SAB Ect Mult Living   4 3 3  1 1    3      Review of Systems  Constitutional: Negative for fever.  HENT: Positive for facial swelling (upper lip). Negative for mouth sores, rhinorrhea and sore throat.   Eyes: Negative for redness.  Respiratory: Negative for  cough.   Cardiovascular: Negative for chest pain.  Gastrointestinal: Positive for abdominal pain. Negative for nausea, vomiting and diarrhea.  Genitourinary: Negative for dysuria, frequency, vaginal bleeding, vaginal discharge and pelvic pain.  Musculoskeletal: Negative for myalgias.  Skin: Negative for rash.  Neurological: Negative for headaches.    Allergies  Review of patient's allergies indicates no known allergies.  Home Medications   Current Outpatient Rx  Name  Route  Sig  Dispense  Refill  . ibuprofen (ADVIL,MOTRIN) 600 MG tablet   Oral   Take 1 tablet (600 mg total) by mouth every 6 (six) hours as needed for pain.   20 tablet   0   . valACYclovir (VALTREX) 500 MG tablet   Oral   Take 1 tablet (500 mg total) by mouth 2 (two) times daily.   6 tablet   0    BP 137/102  Pulse 69  Temp(Src) 97.9 F (36.6 C) (Oral)  Resp 20  SpO2 100% Physical Exam  Nursing note and vitals reviewed. Constitutional: She appears well-developed and well-nourished.  HENT:  Head: Normocephalic and atraumatic.  Upper lip with 1 cm diameter swelling with overlying vesicular blistering consistent with herpes simplex infection. No active draining. No surrounding erythema or warmth. No fluctuance.   Eyes: Conjunctivae are normal. Pupils are equal, round, and reactive to light. Right eye exhibits no discharge. Left eye exhibits no discharge.  Neck: Normal range of motion. Neck supple.  Cardiovascular: Normal rate, regular rhythm and  normal heart sounds.   Pulmonary/Chest: Effort normal and breath sounds normal.  Abdominal: Soft. There is no tenderness.  Lymphadenopathy:    She has no cervical adenopathy.  Neurological: She is alert.  Skin: Skin is warm and dry.  Psychiatric: She has a normal mood and affect.    ED Course  Procedures (including critical care time) Labs Review Labs Reviewed  URINALYSIS, ROUTINE W REFLEX MICROSCOPIC - Abnormal; Notable for the following:    APPearance  CLOUDY (*)    Leukocytes, UA SMALL (*)    All other components within normal limits  URINE MICROSCOPIC-ADD ON - Abnormal; Notable for the following:    Squamous Epithelial / LPF FEW (*)    Bacteria, UA FEW (*)    All other components within normal limits  POCT PREGNANCY, URINE   Imaging Review No results found.  EKG Interpretation   None      8:35 AM Patient seen and examined. Work-up initiated. Will rx Valtrex. Reviewed patient's work-up for amenorrhea. She had HGSIL on pap and it looks like colposcopy was scheduled for August. It does not appear she had this.    Vital signs reviewed and are as follows: Filed Vitals:   04/21/13 0824  BP: 137/102  Pulse: 69  Temp: 97.9 F (36.6 C)  Resp: 20   I spoke with patient in depth regarding her workup for amenorrhea. We talked about the results including the results of her Pap smear. She tells me that she did not followup regarding this. I discussed how important it is to followup with this, stating that if untreated she has approximately a 20% chance of developing cervical cancer. I've given the patient [facility] clinic contact information and encouraged her to see them. She verbalizes understanding and agrees with this plan.  Prescription written for Valtrex. Patient informed of UA results.  Patient urged to return with worsening symptoms or other concerns. Patient verbalized understanding and agrees with plan.    MDM   1. 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 Secondary Amenorrhea    Patient with swollen lip and rash consistent with herpes labialis. Patient started on Valtrex. On history there is a does not appear to be an abscess or secondary infection at this point.  Discussed abnormal Pap smear findings per above. Patient given followup information. Stressed importance of followup.    Renne Crigler, PA-C 04/21/13 [phone]

## 2013-04-22 NOTE — ED Provider Notes (Signed)
Medical screening examination/treatment/procedure(s) were performed by non-physician practitioner and as supervising physician I was immediately available for consultation/collaboration.    Junius Argyle, MD 04/22/13 [phone]

## 2013-05-17 ENCOUNTER — Encounter (HOSPITAL_COMMUNITY): Payer: Self-pay | Admitting: Emergency Medicine

## 2013-05-17 ENCOUNTER — Emergency Department (HOSPITAL_COMMUNITY)
Admission: EM | Admit: 2013-05-17 | Discharge: 2013-05-17 | Disposition: A | Discharge status: Home / Self Care | Payer: Medicaid Other | Attending: Emergency Medicine | Admitting: Emergency Medicine

## 2013-05-17 DIAGNOSIS — K120 Recurrent oral aphthae: Secondary | ICD-10-CM | POA: Insufficient documentation

## 2013-05-17 DIAGNOSIS — B001 Herpesviral vesicular dermatitis: Secondary | ICD-10-CM

## 2013-05-17 DIAGNOSIS — B009 Herpesviral infection, unspecified: Secondary | ICD-10-CM | POA: Insufficient documentation

## 2013-05-17 MED ORDER — ACYCLOVIR 400 MG PO TABS: 400.0000 mg | ORAL_TABLET | Freq: Three times a day (TID) | ORAL | Status: DC

## 2013-05-17 MED ORDER — LIDOCAINE VISCOUS 2 % MT SOLN
15.0000 mL | Freq: Once | OROMUCOSAL | Status: AC
  Administered 2013-05-17: 15 mL via OROMUCOSAL
  Filled 2013-05-17: qty 15

## 2013-05-17 MED ORDER — HYDROCODONE-ACETAMINOPHEN 5-325 MG PO TABS: 2.0000 | ORAL_TABLET | ORAL | Status: DC | PRN

## 2013-05-17 MED ORDER — VALACYCLOVIR HCL 1 G PO TABS: ORAL_TABLET | ORAL | Status: DC

## 2013-05-17 NOTE — ED Provider Notes (Signed)
CSN: [id number]     Arrival date & time 05/17/13  0704 History   First MD Initiated Contact with Patient 05/17/13 [phone]     Chief Complaint  Patient presents with  . Oral Swelling   (Consider location/radiation/quality/duration/timing/severity/associated sxs/prior Treatment) HPI Comments: Complains of blisters and swelling of upper lip, started yesterday. Complains of moderate to severe pain, not controlled by ibuprofen.   Past Medical History  Diagnosis Date  . HSV (herpes simplex virus) infection    Past Surgical History  Procedure Laterality Date  . Incision and drainage abscess      on abdomen and buttocks   Family History  Problem Relation Age of Onset  . Hypertension Maternal Grandmother   . Diabetes Maternal Grandmother   . Heart disease Maternal Grandmother   . Cancer Maternal Grandmother   . Hypertension Paternal Grandmother   . Diabetes Paternal Grandmother   . Heart disease Paternal Grandmother    History  Substance Use Topics  . Smoking status: Never Smoker   . Smokeless tobacco: Never Used  . Alcohol Use: Yes   OB History   Grav Para Term Preterm Abortions TAB SAB Ect Mult Living   4 3 3  1 1    3      Review of Systems  Constitutional: Negative for fever.  HENT: Positive for mouth sores.     Allergies  Review of patient's allergies indicates no known allergies.  Home Medications   Current Outpatient Rx  Name  Route  Sig  Dispense  Refill  . ibuprofen (ADVIL,MOTRIN) 600 MG tablet   Oral   Take 1 tablet (600 mg total) by mouth every 6 (six) hours as needed for pain.   20 tablet   0    BP 105/57  Pulse 74  Temp(Src) 97.4 F (36.3 C) (Oral)  Resp 20  Ht 5' 4.5" (1.638 m)  Wt 271 lb 2 oz (122.981 kg)  BMI 45.84 kg/m2  SpO2 100% Physical Exam  Constitutional: She is oriented to person, place, and time. She appears well-developed and well-nourished. No distress.  HENT:  Head: Normocephalic and atraumatic.  Right Ear: Hearing normal.    Left Ear: Hearing normal.  Nose: Nose normal.  Mouth/Throat: Oropharynx is clear and moist and mucous membranes are normal.    Eyes: Conjunctivae and EOM are normal. Pupils are equal, round, and reactive to light.  Neck: Normal range of motion. Neck supple.  Cardiovascular: Regular rhythm, S1 normal and S2 normal.  Exam reveals no gallop and no friction rub.   No murmur heard. Pulmonary/Chest: Effort normal and breath sounds normal. No respiratory distress. She exhibits no tenderness.  Abdominal: Soft. Normal appearance and bowel sounds are normal. There is no hepatosplenomegaly. There is no tenderness. There is no rebound, no guarding, no tenderness at McBurney's point and negative Murphy's sign. No hernia.  Musculoskeletal: Normal range of motion.  Neurological: She is alert and oriented to person, place, and time. She has normal strength. No cranial nerve deficit or sensory deficit. Coordination normal. GCS eye subscore is 4. GCS verbal subscore is 5. GCS motor subscore is 6.  Skin: Skin is warm, dry and intact. No rash noted. No cyanosis.  Psychiatric: She has a normal mood and affect. Her speech is normal and behavior is normal. Thought content normal.    ED Course  Procedures (including critical care time) Labs Review Labs Reviewed - No data to display Imaging Review No results found.  EKG Interpretation   None  MDM  Diagnosis: Herpes Labialis  Patient with Herpes Labialis lesion which started 24 hours ago. Has had these before. Treat with analgesia, antiviral - Acyclovir 400mg  TID for 7 days. Also Rx for Valtrex 2000mg  po Q12H x2 doses for any future onset of symptoms.    Gilda Crease, MD 05/17/13 [phone]

## 2013-05-17 NOTE — ED Notes (Signed)
PT D/C with a RX for hydrocodone for pain . Pt refuses Ibuprofine because it does not work.

## 2013-05-17 NOTE — ED Notes (Signed)
Pt c/o blisters to top lip noted yesterday. Pt was treated for herpes to her lips in the past reports prescribed meds at that time did not work.

## 2013-05-28 ENCOUNTER — Ambulatory Visit (INDEPENDENT_AMBULATORY_CARE_PROVIDER_SITE_OTHER): Payer: Medicaid Other | Admitting: Obstetrics & Gynecology

## 2013-05-28 ENCOUNTER — Encounter: Payer: Self-pay | Admitting: Obstetrics & Gynecology

## 2013-05-28 ENCOUNTER — Other Ambulatory Visit (HOSPITAL_COMMUNITY)
Admission: RE | Admit: 2013-05-28 | Discharge: 2013-05-28 | Disposition: A | Discharge status: Home / Self Care | Payer: Medicaid Other | Source: Ambulatory Visit | Attending: Obstetrics & Gynecology | Admitting: Obstetrics & Gynecology

## 2013-05-28 VITALS — BP 112/74 | HR 87 | Temp 97.5°F | Ht 64.0 in | Wt 269.2 lb

## 2013-05-28 DIAGNOSIS — N870 Mild cervical dysplasia: Secondary | ICD-10-CM | POA: Insufficient documentation

## 2013-05-28 DIAGNOSIS — N911 Secondary amenorrhea: Secondary | ICD-10-CM

## 2013-05-28 DIAGNOSIS — Z01812 Encounter for preprocedural laboratory examination: Secondary | ICD-10-CM

## 2013-05-28 DIAGNOSIS — R87613 High grade squamous intraepithelial lesion on cytologic smear of cervix (HGSIL): Secondary | ICD-10-CM

## 2013-05-28 DIAGNOSIS — N912 Amenorrhea, unspecified: Secondary | ICD-10-CM

## 2013-05-28 LAB — POCT PREGNANCY, URINE: Preg Test, Ur: NEGATIVE

## 2013-05-28 MED ORDER — MEDROXYPROGESTERONE ACETATE 10 MG PO TABS: 10.0000 mg | ORAL_TABLET | Freq: Every day | ORAL | Status: DC

## 2013-05-28 NOTE — Progress Notes (Signed)
Patient ID: Brenda Tyler, female   DOB: 22-Mar-1986, 27 y.o.   MRN: [id number]  No chief complaint on file.   HPI Brenda Tyler is a 27 y.o. female.  [id number] No LMP recorded. Patient is not currently having periods (Reason: Other). Amenorrhea, did not do Provera challenge. 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 Previous colposcopy: .  Past Medical History  Diagnosis Date  . HSV (herpes simplex virus) infection     Past Surgical History  Procedure Laterality Date  . Incision and drainage abscess      on abdomen and buttocks    Family History  Problem Relation Age of Onset  . Hypertension Maternal Grandmother   . Diabetes Maternal Grandmother   . Heart disease Maternal Grandmother   . Cancer Maternal Grandmother   . Hypertension Paternal Grandmother   . Diabetes Paternal Grandmother   . Heart disease Paternal Grandmother     Social History History  Substance Use Topics  . Smoking status: Never Smoker   . Smokeless tobacco: Never Used  . Alcohol Use: No    No Known Allergies  Current Outpatient Prescriptions  Medication Sig Dispense Refill  . acyclovir (ZOVIRAX) 400 MG tablet Take 1 tablet (400 mg total) by mouth 3 (three) times daily.  28 tablet  0  . ibuprofen (ADVIL,MOTRIN) 600 MG tablet Take 1 tablet (600 mg total) by mouth every 6 (six) hours as needed for pain.  20 tablet  0  . HYDROcodone-acetaminophen (NORCO/VICODIN) 5-325 MG per tablet Take 2 tablets by mouth every 4 (four) hours as needed.  10 tablet  0  . valACYclovir (VALTREX) 1000 MG tablet Take 2000mg  q12h x 2 doses as needed for cold sore. Start immediately at first sign of symptoms.  20 tablet  0   No current facility-administered medications for this visit.    Review of Systems Review of Systems  Constitutional: Negative for fever.  Genitourinary:  Positive for menstrual problem (amenorrhea). Negative for vaginal discharge and pelvic pain.    Blood pressure 112/74, pulse 87, temperature 97.5 F (36.4 C), temperature source Oral, height 5\' 4"  (1.626 m), weight 269 lb 3.2 oz (122.108 kg).  Physical Exam Physical Exam  Constitutional: She is oriented to person, place, and time. She appears well-developed. No distress.  Genitourinary: Vagina normal. No vaginal discharge found.  Cervix grossly nl  Neurological: She is alert and oriented to person, place, and time.  Skin: Skin is warm and dry.  Psychiatric: She has a normal mood and affect. Her behavior is normal.    Data Reviewed Pap and note  Assessment    Procedure Details  The risks and benefits of the procedure and Written informed consent obtained.  Speculum placed in vagina and excellent visualization of cervix achieved, cervix swabbed x 3 with acetic acid solution. Mild AWE 10-2:00, AWE 5-7, no endocervical lesion Specimens: ECC, Bx 6 and 12  Complications: none.     Plan    Specimens labelled and sent to Pathology. Return to discuss Pathology results in 2 weeks.      Aveen Stansel 05/28/2013, 3:38 PM

## 2013-05-28 NOTE — Patient Instructions (Signed)
Colposcopy  Colposcopy is a procedure to examine your cervix and vagina, or the area around the outside of your vagina, for abnormalities or signs of disease. The procedure is done using a lighted microscope called a colposcope. Tissue samples may be collected during the colposcopy if your health care provider finds any unusual cells. A colposcopy may be done if a woman has:   An abnormal Pap test. A Pap test is a medical test done to evaluate cells that are on the surface of the cervix.   A Pap test result that is suggestive of human papillomavirus (HPV). This virus can cause genital warts and is linked to the development of cervical cancer.   A sore on her cervix and the results of a Pap test were normal.   Genital warts on the cervix or in or around the outside of the vagina.   A mother who took the drug diethylstilbestrol (DES) while pregnant.   Painful intercourse.   Vaginal bleeding, especially after sexual intercourse.  LET YOUR HEALTH CARE PROVIDER KNOW ABOUT:   Any allergies you have.   All medicines you are taking, including vitamins, herbs, eye drops, creams, and over-the-counter medicines.   Previous problems you or members of your family have had with the use of anesthetics.   Any blood disorders you have.   Previous surgeries you have had.   Medical conditions you have.  RISKS AND COMPLICATIONS  Generally, a colposcopy is a safe procedure. However, as with any procedure, complications can occur. Possible complications include:   Bleeding.   Infection.   Missed lesions.  BEFORE THE PROCEDURE    Tell your health care provider if you have your menstrual period. A colposcopy typically is not done during menstruation.   For 24 hours before the colposcopy, do not:   Douche.   Use tampons.   Use medicines, creams, or suppositories in the vagina.   Have sexual intercourse.  PROCEDURE   During the procedure, you will be lying on your back with your feet in foot rests (stirrups). A warm  metal or plastic instrument (speculum) will be placed in your vagina to keep it open and to allow the health care provider to see the cervix. The colposcope will be placed outside the vagina. It will be used to magnify and examine the cervix, vagina, and the area around the outside of the vagina. A small amount of liquid solution will be placed on the area that is to be viewed. This solution will make it easier to see the abnormal cells. Your health care provider will use tools to suck out mucus and cells from the canal of the cervix. Then he or she will record the location of the abnormal areas.  If a biopsy is done during the procedure, a medicine will usually be given to numb the area (local anesthetic). You may feel mild pain or cramping while the biopsy is done. After the procedure, tissue samples collected during the biopsy will be sent to a lab for analysis.  AFTER THE PROCEDURE   You will be given instructions on when to follow up with your health care provider for your test results. It is important to keep your appointment.  Document Released: 09/01/2002 Document Revised: 02/11/2013 Document Reviewed: 01/08/2013  [employer] Patient Information 2014 [employer].

## 2013-06-09 ENCOUNTER — Telehealth: Payer: Self-pay | Admitting: *Deleted

## 2013-06-09 NOTE — Telephone Encounter (Signed)
Pt left message stating she would like her test results. I returned pt's call and informed her that she has appt on 12/18 @ 1515 to discuss results and plan of care with Dr. Debroah Loop.  Pt agreed and voiced understanding.

## 2013-06-11 ENCOUNTER — Ambulatory Visit (INDEPENDENT_AMBULATORY_CARE_PROVIDER_SITE_OTHER): Payer: Medicaid Other | Admitting: Obstetrics & Gynecology

## 2013-06-11 VITALS — BP 118/81 | HR 80 | Temp 97.7°F | Ht 64.5 in | Wt 271.8 lb

## 2013-06-11 DIAGNOSIS — R87613 High grade squamous intraepithelial lesion on cytologic smear of cervix (HGSIL): Secondary | ICD-10-CM

## 2013-06-11 DIAGNOSIS — N912 Amenorrhea, unspecified: Secondary | ICD-10-CM

## 2013-06-11 DIAGNOSIS — Z3009 Encounter for other general counseling and advice on contraception: Secondary | ICD-10-CM | POA: Insufficient documentation

## 2013-06-11 DIAGNOSIS — N911 Secondary amenorrhea: Secondary | ICD-10-CM

## 2013-06-11 MED ORDER — NORGESTIM-ETH ESTRAD TRIPHASIC 0.18/0.215/0.25 MG-25 MCG PO TABS: 1.0000 | ORAL_TABLET | Freq: Every day | ORAL | Status: DC

## 2013-06-11 NOTE — Progress Notes (Signed)
   Subjective:    Patient ID: Brenda Tyler, female    DOB: 12-Oct-1985, 27 y.o.   MRN: [id number]  [id number] No LMP recorded. Patient is not currently having periods (Reason: Other). Scant spotting after Provera challenge. Bx result reviewed after colposcopy.    Review of Systems No vaginal bleeding now, no pain    Objective:   Physical Exam  Constitutional: She is oriented to person, place, and time. No distress.  Neurological: She is alert and oriented to person, place, and time.  Skin: Skin is warm and dry.  Psychiatric: She has a normal mood and affect. Her behavior is normal.          Assessment & Plan:  CIN 1 cervical biopsy, HSIL pap Secondary amenorrhea suspect anovulation  Offered expectant management of cervical dysplasia with yearly pap 12 and 24 months vs. LEEP, she accepts pap f/u  Cycle control and contraception with Trisprintec lo, RTC 3 months  Adam Phenix, MD 06/11/2013

## 2013-06-11 NOTE — Patient Instructions (Signed)
Oral Contraception Information Oral contraceptives (OCs) are medicines taken to prevent pregnancy. OCs work by preventing the ovaries from releasing eggs. The hormones in OCs also cause the cervical mucus to thicken, preventing the sperm from entering the uterus. The hormones also cause the uterine lining to become thin, not allowing a fertilized egg to attach to the inside of the uterus. OCs are highly effective when taken exactly as prescribed. However, OCs do not prevent sexually transmitted diseases (STDs). Safe sex practices, such as using condoms along with the pill, can help prevent STDs.  Before taking the pill, you may have a physical exam and Pap test. Your caregiver may order blood tests that may be necessary. Your caregiver will make sure you are a good candidate for oral contraception. Discuss with your caregiver the possible side effects of the OC you may be prescribed. When starting an OC, it can take 2 to 3 months for the body to adjust to the changes in hormone levels in your body.  TYPES OF ORAL CONTRACEPTION  The combination pill. This pill contains estrogen and progestin (synthetic progesterone) hormones. The combination pill comes in either 21-day or 28-day packs. With 21-day packs, you do not take pills for 7 days after the last pill. With 28-day packs, the pill is taken every day. The last 7 pills are without hormones. Certain types of pills have more than 21 hormone-containing pills.  The minipill. This pill contains the progesterone hormone only. It is taken every day continuously. The minipill comes in packs of 91 pills. The first 84 pills contain the hormones, and the last 7 pills do not. The last 7 days are when you will have your menstrual period. You may experience irregular spotting. ADVANTAGES  Decreases premenstrual symptoms.  Treats menstrual period cramps.  Regulates the menstrual cycle.  Decreases a heavy menstrual flow.  Treats acne.  Treats abnormal uterine  bleeding.  Treats chronic pelvic pain.  Treats polycystic ovarian syndrome.  Treats endometriosis.  Can be used as emergency contraception. DISADVANTAGES OCs can be less effective if:  You forget to take the pill at the same time every day.  You have a stomach or intestinal disease that lessens the absorption of the pill.  You take OCs with other medicines that make OCs less effective.  You take expired OCs.  You forget to restart the pill on day 7, when using the packs of 21 pills. Document Released: 09/01/2002 Document Revised: 09/03/2011 Document Reviewed: 11/30/2012 [facility] Patient Information 2014 [place], [place]. Cervical Dysplasia Cervical dysplasia is a condition in which a woman has abnormal changes in the cells of her cervix. The cervix is the opening to the uterus (womb). It is located between the vagina and the uterus. Cervical dysplasia may be the first sign of cervical cancer.  With early detection, treatment, and close follow-up care, nearly all cases of cervical dysplasia can be cured. If left untreated, dysplasia may become more severe.  CAUSES  Cervical dysplasia can be caused by a human papillomavirus (HPV) infection. RISK FACTORS   Having had a sexually transmitted disease, such as chlamydia or a human papillomavirus (HPV) infection.   Becoming sexually active before age 16.   Having had more than 1 sexual partner.   Not using protection during sexual intercourse, especially with new sexual partners.   Having had cancer of the vagina or vulva.   Having a sexual partner whose previous partner had cancer of the cervix or cervical dysplasia.   Having a sexual partner  who has or has had cancer of the penis.   Having a weakened immune system (such as from having HIV or an organ transplant).   Being the daughter of a woman who took diethylstilbestrol(DES) during pregnancy.   Having a family history of cervical cancer.   Smoking. SIGNS AND  SYMPTOMS  There are usually no symptoms. If there are symptoms, they may include:   Abnormal vaginal discharge.   Bleeding between periods or after intercourse.   Bleeding during menopause.   Pain during sexual intercourse (dyspareunia). DIAGNOSIS  A test called a Pap test may be done.During this test, cells are taken from the cervix and then looked at under a microscope. A test in which tissue is removed from the cervix (biopsy) may also be done if the Pap test is abnormal or if the cervix looks abnormal.  TREATMENT  Treatment varies based on the severity of the cervical dysplasia. Treatment may include:  Cryotherapy. During cryotherapy, the abnormal cells are frozen with a steel-tip instrument.   A procedure to remove abnormal tissue from the cervix.  Surgery to remove abnormal tissue. This is usually done in serious cases of cervical dysplasia. Surgical options include:  A cone biopsy. This is a procedure in which the cervical canal and a portion of the center of the cervix are removed.   Hysterectomy. This is a surgery in which the uterus and cervix are removed. HOME CARE INSTRUCTIONS   Only take over-the-counter or prescription medicines for pain or discomfort as directed by your health care provider.   Do not use tampons, have sexual intercourse, or douche until your health care provider says it is OK.  Keep follow-up appointments as directed by your health care provider. Women who have been treated for cervical dysplasia should have regular pelvic exams and Pap tests. During the first year following treatment of cervical dysplasia, Pap tests should be done every 3 4 months. In the second year, they should be done every 6 months or as recommended by your health care provider.  To prevent the condition from developing again, practice safe sex. SEEK MEDICAL CARE IF:  You develop genital warts.  SEEK IMMEDIATE MEDICAL CARE IF:   Your menstrual period is heavier than  normal.   You develop bright red bleeding, especially if you have blood clots.   You have a fever.   You have increasing cramps or pain not relieved with medicine.   You are lightheaded, unusually weak, or have fainting spells.   You have abnormal vaginal discharge.   You have abdominal pain. Document Released: 06/11/2005 Document Revised: 02/11/2013 Document Reviewed: 02/04/2013 [facility] Patient Information 2014 [place], [place].

## 2013-07-15 ENCOUNTER — Encounter: Payer: Self-pay | Admitting: *Deleted

## 2013-08-20 ENCOUNTER — Ambulatory Visit: Payer: Medicaid Other

## 2013-09-02 ENCOUNTER — Ambulatory Visit: Payer: Medicaid Other | Attending: Internal Medicine | Admitting: Internal Medicine

## 2013-09-02 VITALS — BP 136/84 | HR 80 | Temp 98.3°F | Resp 16 | Ht 64.5 in | Wt 280.0 lb

## 2013-09-02 DIAGNOSIS — N946 Dysmenorrhea, unspecified: Secondary | ICD-10-CM

## 2013-09-02 DIAGNOSIS — N926 Irregular menstruation, unspecified: Secondary | ICD-10-CM | POA: Insufficient documentation

## 2013-09-02 NOTE — Progress Notes (Signed)
Pt is here to establish care. Pt reports having occasional pain in her abdomen for 4 years. Pt is very concerned that she has not had a menstrual cycle in 4 years.

## 2013-09-02 NOTE — Progress Notes (Signed)
Patient ID: Brenda Tyler, female   DOB: 10/21/1985, 28 y.o.   MRN: [id number]   CC: irregular menstrual cycles   HPI: Pt is 28 yo female who comes to clinic concerned with 2 years of no menstrual cycles. She has three kids and wants more but has not had menstrual cycle. She denies taking OCP, no abdominal pain, no specific systemic concerns.   No Known Allergies Past Medical History  Diagnosis Date  . HSV (herpes simplex virus) infection    Current Outpatient Prescriptions on File Prior to Visit  Medication Sig Dispense Refill  . acyclovir (ZOVIRAX) 400 MG tablet Take 1 tablet (400 mg total) by mouth 3 (three) times daily.  28 tablet  0  . HYDROcodone-acetaminophen (NORCO/VICODIN) 5-325 MG per tablet Take 2 tablets by mouth every 4 (four) hours as needed.  10 tablet  0  . ibuprofen (ADVIL,MOTRIN) 600 MG tablet Take 1 tablet (600 mg total) by mouth every 6 (six) hours as needed for pain.  20 tablet  0  . medroxyPROGESTERone (PROVERA) 10 MG tablet Take 1 tablet (10 mg total) by mouth daily. Use for ten days  10 tablet  2  . Norgestimate-Ethinyl Estradiol Triphasic (ORTHO TRI-CYCLEN LO) 0.18/0.215/0.25 MG-25 MCG tab Take 1 tablet by mouth daily.  1 Package  11  . valACYclovir (VALTREX) 1000 MG tablet Take 2000mg  q12h x 2 doses as needed for cold sore. Start immediately at first sign of symptoms.  20 tablet  0   No current facility-administered medications on file prior to visit.   Family History  Problem Relation Age of Onset  . Hypertension Maternal Grandmother   . Diabetes Maternal Grandmother   . Heart disease Maternal Grandmother   . Cancer Maternal Grandmother   . Hypertension Paternal Grandmother   . Diabetes Paternal Grandmother   . Heart disease Paternal Grandmother    History   Social History  . Marital Status: Single    Spouse Name: N/A    Number of Children: N/A  . Years of Education: N/A   Occupational History  . Not on file.   Social History Main Topics  .  Smoking status: Never Smoker   . Smokeless tobacco: Never Used  . Alcohol Use: No  . Drug Use: No  . Sexual Activity: Yes    Birth Control/ Protection: None   Other Topics Concern  . Not on file   Social History Narrative  . No narrative on file    Review of Systems  Constitutional: Negative for fever, chills, diaphoresis, activity change, appetite change and fatigue.  HENT: Negative for ear pain, nosebleeds, congestion, facial swelling, rhinorrhea, neck pain, neck stiffness and ear discharge.   Eyes: Negative for pain, discharge, redness, itching and visual disturbance.  Respiratory: Negative for cough, choking, chest tightness, shortness of breath, wheezing and stridor.   Cardiovascular: Negative for chest pain, palpitations and leg swelling.  Gastrointestinal: Negative for abdominal distention.  Genitourinary: Negative for dysuria, urgency, frequency, hematuria, flank pain, decreased urine volume, difficulty urinating and dyspareunia.  Musculoskeletal: Negative for back pain, joint swelling, arthralgias and gait problem.  Neurological: Negative for dizziness, tremors, seizures, syncope, facial asymmetry, speech difficulty, weakness, light-headedness, numbness and headaches.  Hematological: Negative for adenopathy. Does not bruise/bleed easily.  Psychiatric/Behavioral: Negative for hallucinations, behavioral problems, confusion, dysphoric mood, decreased concentration and agitation.    Objective:   Filed Vitals:   09/02/13 1000  BP: 136/84  Pulse: 80  Temp: 98.3 F (36.8 C)  Resp: 16  Physical Exam  Constitutional: Appears well-developed and well-nourished. No distress.  CVS: RRR, S1/S2 +, no murmurs, no gallops, no carotid bruit.  Pulmonary: Effort and breath sounds normal, no stridor, rhonchi, wheezes, rales.  Abdominal: Soft. BS +,  no distension, tenderness, rebound or guarding.  Musculoskeletal: Normal range of motion. No edema and no tenderness.   Lymphadenopathy: No lymphadenopathy noted, cervical, inguinal.   Lab Results  Component Value Date   WBC 3.2* 12/22/2012   HGB 12.1 12/22/2012   HCT 36.1 12/22/2012   MCV 82.0 12/22/2012   PLT 280 12/22/2012   Lab Results  Component Value Date   CREATININE 0.67 12/22/2012   BUN 10 12/22/2012   NA 138 12/22/2012   K 3.7 12/22/2012   CL 105 12/22/2012   CO2 27 12/22/2012    Lab Results  Component Value Date   HGBA1C 5.2 12/22/2012   Lipid Panel  No results found for this basename: chol, trig, hdl, cholhdl, vldl, ldlcalc       Assessment and plan:   Patient Active Problem List   Diagnosis Date Noted  . Secondary Amenorrhea - will provide referral to OB/GYn for further evaluation, discussed type of OCP but pt is not currently interested in taking any medications 12/22/2012  . Obesity - dietary recommendations provided  12/22/2012  . 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

## 2013-10-19 ENCOUNTER — Ambulatory Visit (INDEPENDENT_AMBULATORY_CARE_PROVIDER_SITE_OTHER): Payer: Medicaid Other | Admitting: Nurse Practitioner

## 2013-10-19 ENCOUNTER — Encounter: Payer: Self-pay | Admitting: Nurse Practitioner

## 2013-10-19 VITALS — BP 116/84 | HR 81 | Temp 97.9°F | Ht 64.5 in | Wt 282.4 lb

## 2013-10-19 DIAGNOSIS — R87613 High grade squamous intraepithelial lesion on cytologic smear of cervix (HGSIL): Secondary | ICD-10-CM

## 2013-10-19 DIAGNOSIS — N911 Secondary amenorrhea: Secondary | ICD-10-CM

## 2013-10-19 DIAGNOSIS — N912 Amenorrhea, unspecified: Secondary | ICD-10-CM

## 2013-10-19 NOTE — Patient Instructions (Signed)
Secondary Amenorrhea   Secondary amenorrhea is the stopping of menstrual flow for 3 6 months in a female who has previously had periods. There are many possible causes. Most of these causes are not serious. Usually, treating the underlying problem causing the loss of menses will return your periods to normal.  CAUSES   Some common and uncommon causes of not menstruating include:   Malnutrition.   Low blood sugar (hypoglycemia).   Polycystic ovary disease.   Stress or fear.   Breastfeeding.   Hormone imbalance.   Ovarian failure.   Medicines.   Extreme obesity.   Cystic fibrosis.   Low body weight or drastic weight reduction from any cause.   Early menopause.   Removal of ovaries or uterus.   Contraceptives.   Illness.   Long-term (chronic) illnesses.   Cushing syndrome.   Thyroid problems.   Birth control pills, patches, or vaginal rings for birth control.  RISK FACTORS  You may be at greater risk of secondary amenorrhea if:   You have a family history of this condition.   You have an eating disorder.   You do athletic training.  DIAGNOSIS   A diagnosis is made by your health care provider taking a medical history and doing a physical exam. This will include a pelvic exam to check for problems with your reproductive organs. Pregnancy must be ruled out. Often, numerous blood tests are done to measure different hormones in the body. Urine testing may be done. Specialized exams (ultrasound, CT scan, MRI, or hysteroscopy) may have to be done as well as measuring the body mass index (BMI).  TREATMENT   Treatment depends on the cause of the amenorrhea. If an eating disorder is present, this can be treated with an adequate diet and therapy. Chronic illnesses may improve with treatment of the illness. Amenorrhea may be corrected with medicines, lifestyle changes, or surgery. If the amenorrhea cannot be corrected, it is sometimes possible to create a false menstruation with medicines.  HOME CARE  INSTRUCTIONS   Maintain a healthy diet.   Manage weight problems.   Exercise regularly but not excessively.   Get adequate sleep.   Manage stress.   Be aware of changes in your menstrual cycle. Keep a record of when your periods occur. Note the date your period starts, how long it lasts, and any problems.  SEEK MEDICAL CARE IF:  Your symptoms do not get better with treatment.  Document Released: 07/23/2006 Document Revised: 02/11/2013 Document Reviewed: 11/27/2012  [employer] Patient Information 2014 [employer].

## 2013-10-19 NOTE — Progress Notes (Signed)
History:  Brenda Tyler is a 28 y.o. [phone] who [facility] follow up on secondary amenorrhea. She states she has not had a real period since her last child was born and that was 5 years ago. She has also gone from 180 to 280 in last few years. She has some headache. She was last seen by Dr Debroah LoopArnold in March 2015 and given a Provera challenge. She had scant bleeding following that. She had an ultrasound in June and they had some difficulty reading due to obesity but essentially was normal. When she was seen in June she had labs drawn [facility] Her other amenorrhea labs were normal. She also has history of abnormal pap and colposcopy and needs repeat pap today.   The following portions of the patient's history were reviewed and updated as appropriate: allergies, current medications, past family history, past medical history, past social history, past surgical history and problem list.  Review of Systems:  Pertinent items are noted in HPI.  Objective:  Physical Exam BP 116/84  Pulse 81  Temp(Src) 97.9 F (36.6 C) (Oral)  Ht 5' 4.5" (1.638 m)  Wt 282 lb 6.4 oz (128.096 kg)  BMI 47.74 kg/m2 GENERAL: Well-developed, well-nourished female in no acute distress. Morbid Obesity HEENT: Normocephalic, atraumatic.  NECK: Supple. Normal thyroid.  LUNGS: Normal rate. Clear to auscultation bilaterally.  HEART: Regular rate and rhythm with no adventitious sounds.  BREASTS: Symmetric in size. No masses, skin changes, nipple drainage, or lymphadenopathy. ABDOMEN: Soft, nontender, nondistended. No organomegaly. Normal bowel sounds appreciated in all quadrants.  PELVIC: Normal external female genitalia. Vagina is pink and rugated.  Normal discharge. Normal cervix contour. Pap smear obtained. Uterus is normal in size. No adnexal mass or tenderness.  EXTREMITIES: No cyanosis, clubbing, or edema, 2+ distal pulses.   Labs and Imaging No results found.  Assessment & Plan:   Assessment:  Secondary Amenorrhea Abnormal pap Morbid Obesity  Plans: Consultation with Dr Debroah LoopArnold who [facility] and [facility] LH Advised to refer her to Endocrinologist Follow up 6 months repeat pap  Delbert PhenixLinda M Momoko Slezak, NP 10/19/2013 5:24 PM

## 2013-10-19 NOTE — Progress Notes (Signed)
Pt. Here today because she is still concerned as she has not had a period in two years. Pt. Has also not had a pap smear in over a year; colpo last year showed LSIL and pt. Agreed to pap every year.

## 2013-10-20 LAB — FOLLICLE STIMULATING HORMONE: FSH: 19.7 m[IU]/mL

## 2013-10-20 LAB — LUTEINIZING HORMONE: LH: 27.4 m[IU]/mL

## 2013-10-27 ENCOUNTER — Telehealth: Payer: Self-pay | Admitting: *Deleted

## 2013-10-27 DIAGNOSIS — B379 Candidiasis, unspecified: Secondary | ICD-10-CM

## 2013-10-27 DIAGNOSIS — A599 Trichomoniasis, unspecified: Secondary | ICD-10-CM

## 2013-10-27 MED ORDER — FLUCONAZOLE 150 MG PO TABS: 150.0000 mg | ORAL_TABLET | Freq: Once | ORAL | Status: DC

## 2013-10-27 MED ORDER — METRONIDAZOLE 500 MG PO TABS: 2000.0000 mg | ORAL_TABLET | Freq: Once | ORAL | Status: DC

## 2013-10-27 NOTE — Telephone Encounter (Signed)
Patient has trich and yeast on her pap smear. Also had LSIL pap with +HPV.

## 2013-10-27 NOTE — Telephone Encounter (Signed)
Called pt to inform her of wet prep results, medication needed and heard a message stating that her mailbox is full - unable to leave message.  Pt also needs to be informed of abnormal Pap and have Colpo appt scheduled.

## 2013-10-27 NOTE — Telephone Encounter (Signed)
Rx sent in to pharmacy for yeast and trich. Called patient heard busy tone.

## 2013-10-28 NOTE — Telephone Encounter (Signed)
Note sent to admin pool to schedule pt for colpo.

## 2013-10-29 ENCOUNTER — Ambulatory Visit (INDEPENDENT_AMBULATORY_CARE_PROVIDER_SITE_OTHER): Payer: Medicaid Other | Admitting: Internal Medicine

## 2013-10-29 ENCOUNTER — Encounter: Payer: Self-pay | Admitting: *Deleted

## 2013-10-29 ENCOUNTER — Encounter: Payer: Self-pay | Admitting: Internal Medicine

## 2013-10-29 VITALS — BP 102/78 | HR 74 | Temp 97.8°F | Resp 12 | Ht 64.0 in | Wt 283.8 lb

## 2013-10-29 DIAGNOSIS — N911 Secondary amenorrhea: Secondary | ICD-10-CM

## 2013-10-29 DIAGNOSIS — N912 Amenorrhea, unspecified: Secondary | ICD-10-CM

## 2013-10-29 LAB — TSH: TSH: 1.16 u[IU]/mL (ref 0.35–4.50)

## 2013-10-29 LAB — T4, FREE: Free T4: 0.95 ng/dL (ref 0.60–1.60)

## 2013-10-29 LAB — T3, FREE: T3, Free: 3 pg/mL (ref 2.3–4.2)

## 2013-10-29 NOTE — Progress Notes (Addendum)
Patient ID: Brenda Tyler, female   DOB: 1986/05/16, 28 y.o.   MRN: [id number]  HPI: Brenda Tyler is a 28 y.o. female, referred by Dr Hyman Hopes, for evaluation for secondary amenorrhea, with suspicion for PCOS.  She started Depo Provera after first pregnancy in 2003 >> since then, periods started to become irregular >> stayed on it 1-2 years >> then stopped >> got pregnant in 2007 >> then got 1 more inj then >> pregnant in 2010 >> then got 1 more inj then >> spotting only after this x 2, then stopped having menstrual cycles.   Patient was seen by Dr. Debroah Loop with OB/GYN in 05/2013 for amenorrhea. . She was given Provera challenge, after which, she only had scant spotting. She was suggested to start oral contraceptives, but she refused taking any medications.  She had labs her OB/GYN in June of 2014 and April of 2015, which show high FSH and LH, and normal testosterone. Prolactin, DHEAS, hemoglobin A1c, TSH, were all normal. She had a normal transvaginal ultrasound, with a thin endometrial stripe of 4 mm, in 12/2012.  Patient denies: - taking medications that can cause early menopause - a prior history of infertility - she has 3 children: 55, 7, and 5 y/o. She is trying to have more children with current partner for last 5 years. - menarche in 6th grade >> regular menstrual cycles afterwards - history of chemotherapy or radiotherapy - Family history of deafness - Family history of fragile X sd. or Turner syndrome - nausea/vomiting/abdominal pain - personal or family history of autoimmune disorders - family history of early menopause/premature ovarian insufficiency - acne/hirsutism  Pt started to gain weight after she started the Depo inj (~130 lbs since 2003). Diet: - Breakfast: skips - Lunch: burgers or fries, fruit juice - Dinner: pizza, hamburgers, fast food chicken - Snacks: 3 a day  Other medical pbs: - obesity - HGSIL  Labs: - per review of records from Ob[facility]yn:  10/19/2013: -  FSH 19.7, LH 27.4  12/22/2012: - Prolactin 5.6 - Total testosterone 34, free testosterone 4.1 (0.6-6.8) - TSH 1.489 - FSH 68.9 - DHEAS 137 - HbA1c: 5.2%  Pt has FH of early menopause in: mother (uterine cancer) - ? unclear. No FH of infertility.  ROS: Constitutional: + weight gain, no fatigue, no subjective hyperthermia/hypothermia Eyes: no blurry vision, no xerophthalmia ENT: no sore throat, no nodules palpated in throat, no dysphagia/odynophagia, no hoarseness Cardiovascular: no CP/SOB/palpitations/leg swelling Respiratory: no cough/SOB Gastrointestinal: no N/V/D/C Musculoskeletal: no muscle/joint aches Skin: no acne, no hair on face Neurological: no tremors/numbness/tingling/dizziness, + HA Psychiatric: no depression/anxiety  Past Medical History  Diagnosis Date  . HSV (herpes simplex virus) infection    Past Surgical History  Procedure Laterality Date  . Incision and drainage abscess      on abdomen and buttocks   History   Social History  . Marital Status: Single    Spouse Name: N/A    Number of Children: 3   Social History Main Topics  . Smoking status: Never Smoker   . Smokeless tobacco: Never Used  . Alcohol Use: No  . Drug Use: No  . Sexual Activity: Yes    Birth Control/ Protection: None   Current Outpatient Prescriptions on File Prior to Visit  Medication Sig Dispense Refill  . metroNIDAZOLE (FLAGYL) 500 MG tablet Take 4 tablets (2,000 mg total) by mouth once.  4 tablet  0  . acyclovir (ZOVIRAX) 400 MG tablet Take 1 tablet (400 mg  total) by mouth 3 (three) times daily.  28 tablet  0  . fluconazole (DIFLUCAN) 150 MG tablet Take 1 tablet (150 mg total) by mouth once.  1 tablet  0   No current facility-administered medications on file prior to visit.   No Known Allergies Family History  Problem Relation Age of Onset  . Hypertension Maternal Grandmother   . Diabetes Maternal Grandmother   . Heart disease Maternal Grandmother   . Cancer Maternal  Grandmother   . Hypertension Paternal Grandmother   . Diabetes Paternal Grandmother   . Heart disease Paternal Grandmother   . Heart disease Mother    PE: BP 102/78  Pulse 74  Temp(Src) 97.8 F (36.6 C) (Oral)  Resp 12  Ht 5\' 4"  (1.626 m)  Wt 283 lb 12.8 oz (128.731 kg)  BMI 48.69 kg/m2  SpO2 98% Wt Readings from Last 3 Encounters:  10/29/13 283 lb 12.8 oz (128.731 kg)  10/19/13 282 lb 6.4 oz (128.096 kg)  09/02/13 280 lb (127.007 kg)   Constitutional: obese, in NAD, no full supraclavicular fat pads Eyes: PERRLA, EOMI, no exophthalmos ENT: moist mucous membranes, no thyromegaly, no cervical lymphadenopathy Cardiovascular: RRR, No MRG Respiratory: CTA B Gastrointestinal: abdomen soft, NT, ND, BS+ Musculoskeletal: no deformities, strength intact in all 4 Skin: moist, warm; mild acne on face, no dark terminal hair on chin, + vellum on sideburns, no skin tags, + acanthosis nigricans, no purple, wide, stretch marks Neurological: no tremor with outstretched hands, DTR normal in all 4  ASSESSMENT: 1. 2nd amenorrhea  PLAN: 1.  Pt with secondary amenorrhea in the last 5 year, after being started on Depo Provera and starting to gain weight. She has 3 children and would like to get pregnant again. I reviewed her labs from [facility] and her labs and clinical presentation do not point towards PCOS (normal testosterone, no acne and hirsutism, prior normal menstrual cycles and fertility) but more towards Premature Ovarian Insufficiency (POI) (normal testosterone, high LH and FSH). I will check the following tests to confirm: Orders Placed This Encounter  Procedures  . Testosterone, free, total  . Estradiol  . 21-Hydroxylase Antibodies  . Inhibin B  . Androstenedione  . TSH  . T4, free  . T3, free  . 17-Hydroxyprogesterone  . hCG, quantitative, pregnancy  If these are normal, we may need karyotype (for Turner) or FMR gene analysis (for Fragile X), although I believe these are  unlikely. - She needs HRT and we discussed about the bone and C-V consequences of not having enough Estrogen for so long.  - however, since she is trying to get pregnant, she needs to see a fertility specialist first - we will need to wait to establish a dx first, before the referral, which, b/c of her insurance, needs to come from PCP Patient Instructions  Please join MyChart. I will send you results through there. We may need additional labs depending on the results. You will need a referral to a fertility specialist >> I will suggest this referral to your PCP when the labs are back.  If you decide to not see the fertility specialist, we may need to start hormone replacement therapy. In this case, please call us and schedule a new appointment with me. Otherwise, please return to see me after your future pregnancy.  I will addend the results when they become available.  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 Will need to start HRT (if not seeing reproductive Endo) and in the meantime will check karyotype and FMR gene analysis.   D/w pt and explained the above >> she agrees to the tests. Will [facility]  CYTOGENETIC RESULTS Cytogenetic Reference #: [id number] Test Setup Date: 11/14/2013 Test Completion Date: 11/25/2013 Specimen Source: Peripheral Blood Clinical History:Secondary amenorrhea Metaphases Counted:20 Analyzed:5  Karyotyped:2 Banding Level (G-bands):>=550 KARYOTYPE: 46,XX INTERPRETATION and COMMENTS: NORMAL FEMALE karyotype Within the limits of standard cytogenetic methodologies, the chromosomes had normal G-banding patterns without apparent structural abnormality or rearrangement. This test does not address genetic disorders that cannot be detected by standard cytogenetic methods, or rare events such as low level mosaicism or very subtle rearrangements. Electronic Signature on File ____________________________ Lajuana RippleSteven A. Schonberg, Ph.D., [facility] Technical Director, Cytogenetics, [phone]   Bunnie DominoXSense (R) Fragile X by PCR SEE NOTE    Comments:  RESULT: FEMALE, 32 and 72 CGG repeats (PREMUTATION CARRIER)  Interpretation: The status of the Fragile X locus (FMR1) was determined by PCR amplification and Southern blot analysis, using DNA isolated from a blood specimen. This female individual has one FMR1 allele in the premutation size range with approximately 72 CGG repeats, and one FMR1 allele in the normal size range with 32 CGG repeats. The FMR1 gene has a normal, female pattern of methylation.  Female premutation carriers usually do not have mental dysfunction caused by their premutation allele. However, approximately 20% of female premutation carriers will experience premature ovarian failure (Am J Med Gen [2000] E232864497:189-94). In addition, these individuals are at-risk of passing an expanded FMR1 allele to the next generation, and having offspring affected by Fragile X syndrome. Prenatal diagnosis for Fragile X syndrome is recommended for every pregnancy of a female premutation carrier. Family studies and genetic counseling are recommended. Laboratory testing supervised and results monitored by Trish Fountainharles Strom, M.D., Ph.D., [facility], FAAP, HCLD.   BACKGROUND: Fragile X syndrome (FXS) is caused by mutations in the FMR1 gene (Fragile X Mental Retardation-1). The vast majority of cases are  caused by the expansion of polymorphic CGG repeat in the 5'-untranslated region. Expansions in the full mutation size range (see below) are associated with hypermethylation of the FMR1 gene and loss of FMR1 gene activity.  The normal and mutant size ranges for the FMR1 CGG repeat are as follows: 5-44 (normal), 45-54 (gray zone), 55-200 (premutation), and greater than approximately 200 to 230 (full mutation, affected). The distinction between these ranges is not absolute. Furthermore, recent data indicate that some female premutation carriers over the age of 50 years are affected by a tremor and movement disorder known as Fragile-X-Associated Tremor/Ataxia Syndrome (Am J Hum Genet. 2003;72:869-878). In addition, approximately  20% of female premutation carriers will experience premature ovarian failure (Am J Med Gen. 2000;97:189-194).  The FMR1 CGG repeat region is amplified by the polymerase chain reaction (PCR) in the presence of a fluorescently labeled primer. A chromosome specific region of the amelogenin gene, which is located on both the X and Y chromosomes, is co-amplified to provide gender information. In addition, a second fluorescent PCR reaction (triplet primed PCR) is performed to detect CGG repeat expansions which may be too large to be amplified by the first PCR. Fluorescent amplification products are detected by automated capillary electrophoresis.  Samples positive for FMR1 CGG repeats in the premutation and/or full mutation size fractions are further analyzed by Southern blot analysis to determine the size and methylation status of the FMR1 CGG repeat.   This test was developed and its performance characteristics have been determined by [facility], [facility] [place]. Performance characteristics refer to the analytical performance of the test.   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 I discussed with her about this and I will refer her to genetic counseling. [facility], although it would've been preferred that she had genetic counseling before seeing a fertility specialist. She would like to go ahead with the appointment, especially since I advised her that the genetics appointment might be about 2 months ahead.  I will forward the note above including the results of the [facility]. She did not know the name of the doctor that she would see, but I believe it is Dr. Elesa Hackereaton.

## 2013-10-29 NOTE — Telephone Encounter (Signed)
Message copied by Louanna RawAMPBELL, Marco Adelson M on Thu Oct 29, 2013  8:10 AM ------      Message from: Franchot MimesALFARO, ANAIZ      Created: Thu Oct 29, 2013  7:42 AM      Regarding: RE: Needs Colpo       Patient has been scheduled on 11/02/13 at 1:45p.            ----- Message -----         From: Faith RogueAmanda Andrews Rash, LPN         Sent: 10/28/2013   1:47 PM           To: [facility] Admin Pool      Subject: Needs Colpo                                              Please schedule patient for a colpo and send back to clinical pool.             Thanks,             Marchelle FolksAmanda       ------

## 2013-10-29 NOTE — Telephone Encounter (Signed)
Called pt. Informed her of yeast and trich and of prescriptions at pharmacy-- advised her to abstain from sexual intercourse until she and her partner are both treated. Informed pt. Of abnormal pap and the need to have a colpo-- informed her of appointment date and time. Pt. Verbalized understanding and states she will be able to make the appointment. No further questions or concerns.

## 2013-10-29 NOTE — Patient Instructions (Addendum)
Please join MyChart. I will send you results through there. We may need additional labs depending on the results. You will need a referral to a fertility specialist >> I will suggest this referral to your PCP when the labs are back.  If you decide to not see the fertility specialist, we may need to start hormone replacement therapy. In this case, please call us and schedule a new appointment with me. Otherwise, please return to see me after your future pregnancy.

## 2013-10-30 ENCOUNTER — Ambulatory Visit: Payer: Medicaid Other

## 2013-10-30 DIAGNOSIS — N912 Amenorrhea, unspecified: Secondary | ICD-10-CM

## 2013-10-30 LAB — TESTOSTERONE, FREE, TOTAL, SHBG
Sex Hormone Binding: 75 nmol/L (ref 18–114)
Testosterone, Free: 6.2 pg/mL (ref 0.6–6.8)
Testosterone-% Free: 1 % (ref 0.4–2.4)
Testosterone: 60 ng/dL (ref 10–70)

## 2013-10-30 LAB — HCG, QUANTITATIVE, PREGNANCY: HCG, BETA CHAIN, QUANT, S: 0.47 m[IU]/mL

## 2013-11-02 ENCOUNTER — Other Ambulatory Visit (HOSPITAL_COMMUNITY)
Admission: RE | Admit: 2013-11-02 | Discharge: 2013-11-02 | Disposition: A | Discharge status: Home / Self Care | Payer: Medicaid Other | Source: Ambulatory Visit | Attending: Family Medicine | Admitting: Family Medicine

## 2013-11-02 ENCOUNTER — Ambulatory Visit (INDEPENDENT_AMBULATORY_CARE_PROVIDER_SITE_OTHER): Payer: Medicaid Other | Admitting: Family Medicine

## 2013-11-02 VITALS — Ht 64.5 in | Wt 283.1 lb

## 2013-11-02 DIAGNOSIS — R87613 High grade squamous intraepithelial lesion on cytologic smear of cervix (HGSIL): Secondary | ICD-10-CM

## 2013-11-02 LAB — POCT PREGNANCY, URINE: PREG TEST UR: NEGATIVE

## 2013-11-02 NOTE — Patient Instructions (Signed)
Abnormal Pap Test Information During a Pap test, the cells on the surface of your cervix are checked to see if they look normal, abnormal, or if they show signs of having been altered by a certain type of virus called human papillomavirus, or HPV. Cervical cells that have been affected by HPV are called dysplasia. Dysplasia is not cancer, but describes abnormal cells found on the surface of the cervix. Depending on the degree of dysplasia, some of the cells may be considered pre-cancerous and may turn into cancer over time if follow up with a caregiver is delayed.  WHAT DOES AN ABNORMAL PAP TEST MEAN? Having an abnormal pap test does not mean that you have cancer. However, certain types of abnormal pap tests can be a sign that a person is at a higher risk of developing cancer. Your caregiver will want to do other tests to find out more about the abnormal cells. Your abnormal Pap test results could show:   Small and uncertain changes that should be carefully watched.   Cervical dysplasia that has caused mild changes and can be followed over time.  Cervical dysplasia that is more severe and needs to be followed and treated to ensure the problem goes away.  Cancer.  When severe cervical dysplasia is found and treated early, it rarely will grow into cancer.  WHAT WILL BE DONE ABOUT MY ABNORMAL PAP TEST?  A colposcopy may be needed. This is a procedure where your cervix is examined using light and magnification.  A small tissue sample of your cervix (biopsy) may need to be removed and then examined. This is often performed if there are areas that appear infected.  A sample of cells from the cervical canal may be removed with either a small brush or scraping instrument (curette). Based on the results of the procedures above, some caregivers may recommend either cryotherapy of the cervix or a surgical LEEP where a portion of the cervix is removed. LEEP is short for "loop electrical excisional  procedure." Rarely, a caregiver may recommend a cone biopsy.This is a procedure where a small, cone-shaped sample of your cervix is taken out. The part that is taken out is the area where the abnormal cells are.  WHAT IF I HAVE A DYSPLASIA OR A CANCER? You may be referred to a specialist. Radiation may also be a treatment for more advanced cancer. Having a hysterectomy is the last treatment option for dysplasia, but it is a more common treatment for someone with cancer. All treatment options will be discussed with you by your caregiver. WHAT SHOULD YOU DO AFTER BEING TREATED? If you have had an abnormal pap test, you should continue to have regular pap tests and check-ups as directed by your caregiver. Your cervical problem will be carefully watched so it does not get worse. Also, your caregiver can watch for, and treat, any new problems that may come up. Document Released: 09/26/2010 Document Revised: 10/06/2012 Document Reviewed: 06/07/2011 Exit[employer] Patient Information 2014 [employer].  

## 2013-11-02 NOTE — Progress Notes (Signed)
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 Discussed options of waiting and repeat pap smear at 12mon from colpo vs repeat colpo today. Pt elects to repeat colpo.  Gen:  WD, WN, NAD, A&Ox3, pleasant LAB: HCG today is negative  Procedure: Colposcopy Consent: The risks and benefits of the procedure were discussed with the patient. Risks include acute and chronic abdominal and/or pelvic pain, infection (minimized by sterile technique), bleeding (internal and external), and need for subsequent procedure to correct a complication or further treat symptoms. Rarely, damage to internal structures may occur requiring subsequent procedures or, exceedingly rare, repair or removal of the uterus. Benefits may include further evaluation and diagnosis of cervical pathology. The alternatives were explained to the patient including: not doing procedure or postponing procedure. The disadvantages to not doing the procedure were discussed with the patent, including missed diagnosis of cervical cancer The written consent form has been signed and placed in the patient's medical record The patient voiced understanding of the procedure and agreed to proceed. Indication: Cervical dysplasia work up per ASCCP Guidelines Physicians: Dr. Jolyn LentMichael Norberta Stobaugh Description in detail:   Chaperone present for exam Nash DimmerKerry A timeout was completed before the start of the procedure - site verified and documented in the chart.  External genitalia w/o visible lesions. Speculum exam revealed normal appearing cervix.  The cervix was examined using high powered microscopy  Acetic acid applied to cervix - minimal acetowhite changes at 9 and 6oclock  The entire transformation zone / squamocolumnar junction was visualized.  Biopsies completed - 9&6oclock  ECC completed EBL: Minimal, <703mL; hemostasis achieved prior to removing speculum Complications: None; Pt. tolerated procedure  well. Instructions to patient: Return to clinic vs. ER, depending on severity, with fevers,   increasing pain, or difficulty walking. Expect mild soreness and spotting for one day.  Call clinic with other questions. Post-procedure handout given to patient. Nothing in vagina for two weeks. 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 Will call pt with results and follow ASCCP guidelines. NOTE: Bx and ECC pending

## 2013-11-03 LAB — INHIBIN B: Inhibin B: 15 pg/mL

## 2013-11-04 ENCOUNTER — Encounter: Payer: Self-pay | Admitting: *Deleted

## 2013-11-04 LAB — ANDROSTENEDIONE: Androstenedione: 81 ng/dL

## 2013-11-04 LAB — 17-HYDROXYPROGESTERONE: 17-OH-Progesterone, LC/MS/MS: 52 ng/dL

## 2013-11-05 LAB — ESTRADIOL, FREE
ESTRADIOL FREE: 2.2 pg/mL
ESTRADIOL: 139 pg/mL

## 2013-11-06 LAB — 21-HYDROXYLASE ANTIBODIES: 21-Hydroxylase Antibodies: <1 u/mL

## 2013-11-09 NOTE — Addendum Note (Signed)
Addended by: Carlus PavlovGHERGHE, Kaniel Kiang on: 11/09/2013 06:34 PM   Modules accepted: Orders

## 2013-11-10 ENCOUNTER — Encounter: Payer: Self-pay | Admitting: Internal Medicine

## 2013-11-10 ENCOUNTER — Encounter: Payer: Self-pay | Admitting: Family Medicine

## 2013-11-10 NOTE — Addendum Note (Signed)
Addended by: Carlus PavlovGHERGHE, Kayley Zeiders on: 11/10/2013 10:04 AM   Modules accepted: Orders

## 2013-11-12 ENCOUNTER — Telehealth: Payer: Self-pay | Admitting: *Deleted

## 2013-11-12 ENCOUNTER — Other Ambulatory Visit: Payer: Self-pay | Admitting: Internal Medicine

## 2013-11-12 NOTE — Telephone Encounter (Signed)
Message copied by Gerome ApleyZEYFANG, Chavie Kolinski L on Thu Nov 12, 2013 12:39 PM ------      Message from: Sherre LainASH, AMANDA A      Created: Thu Nov 12, 2013 10:10 AM                   ----- Message -----         From: Vivien Rotaheryl A Clinton         Sent: 11/11/2013   4:37 PM           To: Juliette MangleKelly Powell Rassette, RN, #            Appointment is 06/17 @2 :15                  ----- Message -----         From: Faith RogueAmanda Andrews Rash, LPN         Sent: 11/10/2013   9:28 AM           To: [facility] Admin Pool            Please schedule LEEP and send back to clinical pool.             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            Tawana ScaleMichael Ryan Odom, MD      OB Fellow                   ------

## 2013-11-12 NOTE — Telephone Encounter (Signed)
Called Brenda Tyler and notified her of abnormal colposcopy and reccomendation for treatment is LEEP. Gave her appointment and answered questions.

## 2013-11-21 ENCOUNTER — Encounter (HOSPITAL_COMMUNITY): Payer: Self-pay | Admitting: Emergency Medicine

## 2013-11-21 ENCOUNTER — Emergency Department (HOSPITAL_COMMUNITY)
Admission: EM | Admit: 2013-11-21 | Discharge: 2013-11-21 | Disposition: A | Discharge status: Home / Self Care | Payer: Medicaid Other | Source: Home / Self Care

## 2013-11-21 ENCOUNTER — Other Ambulatory Visit (HOSPITAL_COMMUNITY)
Admission: RE | Admit: 2013-11-21 | Discharge: 2013-11-21 | Disposition: A | Discharge status: Home / Self Care | Payer: Medicaid Other | Source: Ambulatory Visit | Attending: Family Medicine | Admitting: Family Medicine

## 2013-11-21 DIAGNOSIS — N949 Unspecified condition associated with female genital organs and menstrual cycle: Secondary | ICD-10-CM

## 2013-11-21 DIAGNOSIS — N760 Acute vaginitis: Secondary | ICD-10-CM | POA: Insufficient documentation

## 2013-11-21 DIAGNOSIS — R102 Pelvic and perineal pain: Secondary | ICD-10-CM

## 2013-11-21 DIAGNOSIS — Z113 Encounter for screening for infections with a predominantly sexual mode of transmission: Secondary | ICD-10-CM | POA: Insufficient documentation

## 2013-11-21 DIAGNOSIS — N898 Other specified noninflammatory disorders of vagina: Secondary | ICD-10-CM

## 2013-11-21 DIAGNOSIS — N9489 Other specified conditions associated with female genital organs and menstrual cycle: Secondary | ICD-10-CM

## 2013-11-21 MED ORDER — METRONIDAZOLE 500 MG PO TABS: 500.0000 mg | ORAL_TABLET | Freq: Two times a day (BID) | ORAL | Status: DC

## 2013-11-21 MED ORDER — FLUCONAZOLE 150 MG PO TABS: ORAL_TABLET | ORAL | Status: DC

## 2013-11-21 NOTE — ED Notes (Signed)
No specimen was received by POC lab for UA.

## 2013-11-21 NOTE — ED Provider Notes (Signed)
Medical screening examination/treatment/procedure(s) were performed by resident physician or non-physician practitioner and as supervising physician I was immediately available for consultation/collaboration.   Cherylann Hobday DOUGLAS MD.   Julliana Whitmyer D Valary Manahan, MD 11/21/13 [id number] 

## 2013-11-21 NOTE — ED Notes (Signed)
C/o  Vaginal irritation.  Burning sensation in vaginal area.   X 2 days.  No otc meds used for treatment.

## 2013-11-21 NOTE — Discharge Instructions (Signed)
Pelvic Pain, Female °Female pelvic pain can be caused by many different things and start from a variety of places. Pelvic pain refers to pain that is located in the lower half of the abdomen and between your hips. The pain may occur over a short period of time (acute) or may be reoccurring (chronic). The cause of pelvic pain may be related to disorders affecting the female reproductive organs (gynecologic), but it may also be related to the bladder, kidney stones, an intestinal complication, or muscle or skeletal problems. Getting help right away for pelvic pain is important, especially if there has been severe, sharp, or a sudden onset of unusual pain. It is also important to get help right away because some types of pelvic pain can be life threatening.  °CAUSES  °Below are only some of the causes of pelvic pain. The causes of pelvic pain can be in one of several categories.  °· Gynecologic. °· Pelvic inflammatory disease. °· Sexually transmitted infection. °· Ovarian cyst or a twisted ovarian ligament (ovarian torsion). °· Uterine lining that grows outside the uterus (endometriosis). °· Fibroids, cysts, or tumors. °· Ovulation. °· Pregnancy. °· Pregnancy that occurs outside the uterus (ectopic pregnancy). °· Miscarriage. °· Labor. °· Abruption of the placenta or ruptured uterus. °· Infection. °· Uterine infection (endometritis). °· Bladder infection. °· Diverticulitis. °· Miscarriage related to a uterine infection (septic abortion). °· Bladder. °· Inflammation of the bladder (cystitis). °· Kidney stone(s). °· Gastrointenstinal. °· Constipation. °· Diverticulitis. °· Neurologic. °· Trauma. °· Feeling pelvic pain because of mental or emotional causes (psychosomatic). °· Cancers of the bowel or pelvis. °EVALUATION  °Your caregiver will want to take a careful history of your concerns. This includes recent changes in your health, a careful gynecologic history of your periods (menses), and a sexual history. Obtaining  your family history and medical history is also important. Your caregiver may suggest a pelvic exam. A pelvic exam will help identify the location and severity of the pain. It also helps in the evaluation of which organ system may be involved. In order to identify the cause of the pelvic pain and be properly treated, your caregiver may order tests. These tests may include:  °· A pregnancy test. °· Pelvic ultrasonography. °· An X-ray exam of the abdomen. °· A urinalysis or evaluation of vaginal discharge. °· Blood tests. °HOME CARE INSTRUCTIONS  °· Only take over-the-counter or prescription medicines for pain, discomfort, or fever as directed by your caregiver.   °· Rest as directed by your caregiver.   °· Eat a balanced diet.   °· Drink enough fluids to make your urine clear or pale yellow, or as directed.   °· Avoid sexual intercourse if it causes pain.   °· Apply warm or cold compresses to the lower abdomen depending on which one helps the pain.   °· Avoid stressful situations.   °· Keep a journal of your pelvic pain. Write down when it started, where the pain is located, and if there are things that seem to be associated with the pain, such as food or your menstrual cycle. °· Follow up with your caregiver as directed.   °SEEK MEDICAL CARE IF: °· Your medicine does not help your pain. °· You have abnormal vaginal discharge. °SEEK IMMEDIATE MEDICAL CARE IF:  °· You have heavy bleeding from the vagina.   °· Your pelvic pain increases.   °· You feel lightheaded or faint.   °· You have chills.   °· You have pain with urination or blood in your urine.   °· You have uncontrolled   diarrhea or vomiting.   °· You have a fever or persistent symptoms for more than 3 days. °· You have a fever and your symptoms suddenly get worse.   °· You are being physically or sexually abused.   °MAKE SURE YOU: °· Understand these instructions. °· Will watch your condition. °· Will get help if you are not doing well or get worse. °Document  Released: 05/08/2004 Document Revised: 12/11/2011 Document Reviewed: 10/01/2011 °ExitCare® Patient Information ©2014 [employer]. ° °

## 2013-11-21 NOTE — ED Provider Notes (Signed)
CSN: [id number]     Arrival date & time 11/21/13  0901 History   First MD Initiated Contact with Patient 11/21/13 0914     Chief Complaint  Patient presents with  . Vaginal Itching   (Consider location/radiation/quality/duration/timing/severity/associated sxs/prior Treatment) HPI Comments: C/O vulvovaginal burning, itching, irritation and odor for 4-5 days.   Past Medical History  Diagnosis Date  . HSV (herpes simplex virus) infection    Past Surgical History  Procedure Laterality Date  . Incision and drainage abscess      on abdomen and buttocks   Family History  Problem Relation Age of Onset  . Hypertension Maternal Grandmother   . Diabetes Maternal Grandmother   . Heart disease Maternal Grandmother   . Cancer Maternal Grandmother   . Hypertension Paternal Grandmother   . Diabetes Paternal Grandmother   . Heart disease Paternal Grandmother   . Heart disease Mother    History  Substance Use Topics  . Smoking status: Never Smoker   . Smokeless tobacco: Never Used  . Alcohol Use: No   OB History   Grav Para Term Preterm Abortions TAB SAB Ect Mult Living   4 3 3  1 1    3      Review of Systems  Constitutional: Negative.   Gastrointestinal: Negative.   Genitourinary: Negative for dysuria, urgency, vaginal discharge, difficulty urinating and pelvic pain.  Musculoskeletal: Negative.     Allergies  Review of patient's allergies indicates no known allergies.  Home Medications   Prior to Admission medications   Medication Sig Start Date End Date Taking? Authorizing Provider  acyclovir (ZOVIRAX) 400 MG tablet Take 1 tablet (400 mg total) by mouth 3 (three) times daily. 05/17/13   Gilda Crease, MD  fluconazole (DIFLUCAN) 150 MG tablet 1 tab po x 1. May repeat in 72 hours if no improvement 11/21/13   Hayden Rasmussen, NP  metroNIDAZOLE (FLAGYL) 500 MG tablet Take 1 tablet (500 mg total) by mouth 2 (two) times daily. X 7 days 11/21/13   Hayden Rasmussen, NP   BP 107/74   Pulse 73  Temp(Src) 97.9 F (36.6 C) (Oral)  Resp 18  SpO2 98%  LMP 10/22/2013 Physical Exam  Nursing note and vitals reviewed. Constitutional: She is oriented to person, place, and time. She appears well-developed and well-nourished. No distress.  Pulmonary/Chest: Effort normal. No respiratory distress.  Genitourinary:  NEFG No observed swelling or erythema. Vaginal wall coated with gray to white discharge.  Unable to capture cx due to redundant vag walls and  body habitus   Neurological: She is alert and oriented to person, place, and time.  Skin: Skin is warm and dry.    ED Course  Procedures (including critical care time) Labs Review Labs Reviewed  CERVICOVAGINAL ANCILLARY ONLY    Imaging Review No results found.   MDM   1. Vaginal discharge   2. Vulvovaginal discomfort    Cytology pending Diflucan flagyl    Hayden Rasmussen, NP 11/21/13 [phone]

## 2013-11-25 LAB — CHROMOSOME ANALYSIS, PERIPHERAL BLOOD

## 2013-11-27 ENCOUNTER — Encounter: Payer: Self-pay | Admitting: *Deleted

## 2013-12-01 NOTE — ED Notes (Signed)
GC/Chlamydia neg., Affirm: Candida pos., Gardnerella and Trich neg.  Pt. adequately treated with Diflucan. Robertta Halfhill M Bradlee Bridgers 12/01/2013  

## 2013-12-02 LAB — FRAGILE X REFLEX

## 2013-12-02 LAB — FRAGILE X WITH REFLEX PCR

## 2013-12-04 ENCOUNTER — Encounter: Payer: Self-pay | Admitting: *Deleted

## 2013-12-04 NOTE — Addendum Note (Signed)
Addended by: Carlus PavlovGHERGHE, Atonya Templer on: 12/04/2013 02:57 PM   Modules accepted: Orders

## 2013-12-09 ENCOUNTER — Encounter: Payer: Medicaid Other | Admitting: Obstetrics & Gynecology

## 2013-12-16 ENCOUNTER — Telehealth: Payer: Self-pay | Admitting: *Deleted

## 2013-12-16 NOTE — Telephone Encounter (Signed)
Called pt per Dr Elvera LennoxGherghe and had to lvm. 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 The referral has to happen soon as there is a long waiting list and she is determined to get pregnant again (seeing infertility).

## 2013-12-23 ENCOUNTER — Telehealth: Payer: Self-pay | Admitting: *Deleted

## 2013-12-23 ENCOUNTER — Encounter: Payer: Self-pay | Admitting: *Deleted

## 2013-12-23 ENCOUNTER — Encounter: Payer: Medicaid Other | Admitting: Obstetrics & Gynecology

## 2013-12-23 NOTE — Telephone Encounter (Signed)
Brenda Tyler missed a scheduled appointment for a LEEP procedure. Called and left a message that she missed an appointment. Please call and reschedule. Also sent letter notifying her she missed appointment and needs to reschedule.

## 2014-01-15 ENCOUNTER — Encounter: Payer: Self-pay | Admitting: General Practice

## 2014-02-12 ENCOUNTER — Encounter: Payer: Self-pay | Admitting: General Practice

## 2014-03-03 ENCOUNTER — Ambulatory Visit (INDEPENDENT_AMBULATORY_CARE_PROVIDER_SITE_OTHER): Payer: Medicaid Other | Admitting: Obstetrics & Gynecology

## 2014-03-03 VITALS — BP 97/69 | HR 85 | Temp 98.4°F | Ht 65.5 in | Wt 286.3 lb

## 2014-03-03 DIAGNOSIS — Z9889 Other specified postprocedural states: Secondary | ICD-10-CM

## 2014-03-03 DIAGNOSIS — N871 Moderate cervical dysplasia: Secondary | ICD-10-CM

## 2014-03-03 LAB — POCT PREGNANCY, URINE: PREG TEST UR: NEGATIVE

## 2014-03-03 NOTE — Progress Notes (Signed)
   Subjective:    Patient ID: Brenda Tyler, female    DOB: July 10, 1985, 28 y.o.   MRN: [id number]  HPI  This S AA P3 is here today with the h/o LGSIL pap and colpo c/w LGSIL. Her ECC was benign and cervical biopsy showed CIN2. I discussed LEEP as well as cryo. She opts for cryo.  Review of Systems     Objective:   Physical Exam  Her cervix was prepped with acetic acid.  I then applied cryo for 3 minutes. She tolerated the procedure well.      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

## 2014-03-13 ENCOUNTER — Emergency Department (INDEPENDENT_AMBULATORY_CARE_PROVIDER_SITE_OTHER)
Admission: EM | Admit: 2014-03-13 | Discharge: 2014-03-13 | Disposition: A | Discharge status: Home / Self Care | Payer: Self-pay | Source: Home / Self Care | Attending: Family Medicine | Admitting: Family Medicine

## 2014-03-13 ENCOUNTER — Encounter (HOSPITAL_COMMUNITY): Payer: Self-pay | Admitting: Emergency Medicine

## 2014-03-13 DIAGNOSIS — B002 Herpesviral gingivostomatitis and pharyngotonsillitis: Secondary | ICD-10-CM

## 2014-03-13 MED ORDER — ACYCLOVIR 400 MG PO TABS: 400.0000 mg | ORAL_TABLET | Freq: Four times a day (QID) | ORAL | Status: DC

## 2014-03-13 MED ORDER — MUPIROCIN 2 % EX OINT: 1.0000 "application " | TOPICAL_OINTMENT | Freq: Two times a day (BID) | CUTANEOUS | Status: DC

## 2014-03-13 NOTE — ED Notes (Signed)
C/o sore area on upper lip x past 3 days, no relief w Rx (unknown )

## 2014-03-13 NOTE — ED Provider Notes (Signed)
Brenda Tyler is a 28 y.o. female who presents to Urgent Care today for cold sore. Patient developed tingling on her upper lip about 3 days ago shortly followed by a large blister. 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 She's tried Abreva which did not help. No fevers or chills nausea vomiting or diarrhea. No other medications tried yet.   Past Medical History  Diagnosis Date  . HSV (herpes simplex virus) infection    History  Substance Use Topics  . Smoking status: Never Smoker   . Smokeless tobacco: Never Used  . Alcohol Use: No   ROS as above Medications: No current facility-administered medications for this encounter.   Current Outpatient Prescriptions  Medication Sig Dispense Refill  . acyclovir (ZOVIRAX) 400 MG tablet Take 1 tablet (400 mg total) by mouth 4 (four) times daily.  25 tablet  0  . mupirocin ointment (BACTROBAN) 2 % Apply 1 application topically 2 (two) times daily.  30 g  0    Exam:  BP 128/95  Pulse 73  Temp(Src) 98 F (36.7 C) (Oral)  SpO2 99%  LMP 10/22/2013 Gen: Well NAD HEENT: EOMI,  MMM large crusted vesicle upper lip. No other oral lesions. Lungs: Normal work of breathing. CTABL Heart: RRR no MRG Abd: NABS, Soft. Nondistended, Nontender Exts: Brisk capillary refill, warm and well perfused.   No results found for this or any previous visit (from the past 24 hour(s)). No results found.  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 Trial of acyclovir. Treatment with mupirocin ointment for possible bacterial secondary infection  Discussed warning signs or symptoms. Please see discharge instructions. Patient expresses understanding.     Rodolph Bong, MD 03/13/14 [phone]

## 2014-03-13 NOTE — Discharge Instructions (Signed)
Thank you for coming in today. Take acyclovir 5 times daily for 5 days Apply mupirocin antibiotic ointment to the lip 2 or 3 times daily Come back as needed  Herpes Labialis You have a fever blister or cold sore (herpes labialis). These painful, grouped sores are caused by one of the herpes viruses (HSV1 most commonly). They are usually found around the lips and mouth, but the same infection can also affect other areas on the face such as the nose and eyes. Herpes infections take about 10 days to heal. They often occur again and again in the same spot. Other symptoms may include numbness and tingling in the involved skin, achiness, fever, and swollen glands in the neck. Colds, emotional stress, injuries, or excess sunlight exposure all seem to make herpes reappear. Herpes lip infections are contagious. Direct contact with these sores can spread the infection. It can also be spread to other parts of your own body. TREATMENT  Herpes labialis is usually self-limited and resolves within 1 week. To reduce pain and swelling, apply ice packs frequently to the sores or suck on popsicles or frozen juice bars. Antiviral medicine may be used by mouth to shorten the duration of the breakout. Avoid spreading the infection by washing your hands often. Be careful not to touch your eyes or genital areas after handling the infected blisters. Do not kiss or have other intimate contact with others. After the blisters are completely healed you may resume contact. Use sunscreen to lessen recurrences.  If this is your first infection with herpes, or if you have a severe or repeated infections, your caregiver may prescribe one of the anti-viral drugs to speed up the healing. If you have sun-related flare-ups despite the use of sunscreen, starting oral anti-viral medicine before a prolonged exposure (going skiing or to the beach) can prevent most episodes.  SEEK IMMEDIATE MEDICAL CARE IF:  You develop a headache, sleepiness,  high fever, vomiting, or severe weakness.  You have eye irritation, pain, blurred vision or redness.  You develop a prolonged infection not getting better in 10 days. Document Released: 06/11/2005 Document Revised: 09/03/2011 Document Reviewed: 04/15/2009 [facility] Patient Information 2015 [place], [place]. This information is not intended to replace advice given to you by your health care provider. Make sure you discuss any questions you have with your health care provider.

## 2014-04-04 ENCOUNTER — Encounter (HOSPITAL_COMMUNITY): Payer: Self-pay

## 2014-04-04 ENCOUNTER — Ambulatory Visit (HOSPITAL_COMMUNITY): Payer: Self-pay

## 2014-04-04 ENCOUNTER — Inpatient Hospital Stay (HOSPITAL_COMMUNITY)
Admission: AD | Admit: 2014-04-04 | Discharge: 2014-04-04 | Disposition: A | Discharge status: Home / Self Care | Payer: Medicaid Other | Source: Ambulatory Visit | Attending: Obstetrics & Gynecology | Admitting: Obstetrics & Gynecology

## 2014-04-04 DIAGNOSIS — N949 Unspecified condition associated with female genital organs and menstrual cycle: Secondary | ICD-10-CM | POA: Insufficient documentation

## 2014-04-04 DIAGNOSIS — A6000 Herpesviral infection of urogenital system, unspecified: Secondary | ICD-10-CM | POA: Insufficient documentation

## 2014-04-04 DIAGNOSIS — R102 Pelvic and perineal pain: Secondary | ICD-10-CM

## 2014-04-04 DIAGNOSIS — B0089 Other herpesviral infection: Secondary | ICD-10-CM

## 2014-04-04 DIAGNOSIS — R109 Unspecified abdominal pain: Secondary | ICD-10-CM | POA: Diagnosis present

## 2014-04-04 LAB — URINE MICROSCOPIC-ADD ON

## 2014-04-04 LAB — WET PREP, GENITAL: TRICH WET PREP: NONE SEEN

## 2014-04-04 LAB — HIV ANTIBODY (ROUTINE TESTING W REFLEX): HIV: REACTIVE — AB

## 2014-04-04 LAB — CBC
HCT: 39.6 % (ref 36.0–46.0)
Hemoglobin: 13.1 g/dL (ref 12.0–15.0)
MCH: 29.3 pg (ref 26.0–34.0)
MCHC: 33.1 g/dL (ref 30.0–36.0)
MCV: 88.6 fL (ref 78.0–100.0)
PLATELETS: 247 10*3/uL (ref 150–400)
RBC: 4.47 MIL/uL (ref 3.87–5.11)
RDW: 13.6 % (ref 11.5–15.5)
WBC: 4.5 10*3/uL (ref 4.0–10.5)

## 2014-04-04 LAB — COMPREHENSIVE METABOLIC PANEL
ALBUMIN: 3.5 g/dL (ref 3.5–5.2)
ALT: 14 U/L (ref 0–35)
AST: 17 U/L (ref 0–37)
Alkaline Phosphatase: 106 U/L (ref 39–117)
Anion gap: 8 (ref 5–15)
BUN: 7 mg/dL (ref 6–23)
CO2: 27 meq/L (ref 19–32)
CREATININE: 0.78 mg/dL (ref 0.50–1.10)
Calcium: 8.7 mg/dL (ref 8.4–10.5)
Chloride: 104 mEq/L (ref 96–112)
GFR calc Af Amer: >90 mL/min (ref >90)
Glucose, Bld: 91 mg/dL (ref 70–99)
Potassium: 4.1 mEq/L (ref 3.7–5.3)
SODIUM: 139 meq/L (ref 137–147)
Total Bilirubin: 0.4 mg/dL (ref 0.3–1.2)
Total Protein: 8.2 g/dL (ref 6.0–8.3)

## 2014-04-04 LAB — URINALYSIS, ROUTINE W REFLEX MICROSCOPIC
Bilirubin Urine: NEGATIVE
Glucose, UA: NEGATIVE mg/dL
Hgb urine dipstick: NEGATIVE
Ketones, ur: NEGATIVE mg/dL
Nitrite: NEGATIVE
Protein, ur: NEGATIVE mg/dL
Specific Gravity, Urine: 1.025 (ref 1.005–1.030)
Urobilinogen, UA: 0.2 mg/dL (ref 0.0–1.0)
pH: 6 (ref 5.0–8.0)

## 2014-04-04 LAB — HIV 1/2 CONFIRMATION
HIV-1 antibody: POSITIVE — AB
HIV-2 Ab: NEGATIVE

## 2014-04-04 LAB — AMYLASE: Amylase: 66 U/L (ref 0–105)

## 2014-04-04 LAB — LIPASE, BLOOD: LIPASE: 52 U/L (ref 11–59)

## 2014-04-04 LAB — POCT PREGNANCY, URINE: Preg Test, Ur: NEGATIVE

## 2014-04-04 MED ORDER — KETOROLAC TROMETHAMINE 60 MG/2ML IM SOLN
60.0000 mg | Freq: Once | INTRAMUSCULAR | Status: AC
  Administered 2014-04-04: 60 mg via INTRAMUSCULAR
  Filled 2014-04-04: qty 2

## 2014-04-04 MED ORDER — VALACYCLOVIR HCL 500 MG PO TABS
500.0000 mg | ORAL_TABLET | Freq: Once | ORAL | Status: AC
Start: 2014-04-04 — End: 2014-04-04
  Administered 2014-04-04: 500 mg via ORAL
  Filled 2014-04-04: qty 1

## 2014-04-04 MED ORDER — ACYCLOVIR 5 % EX OINT
1.0000 "application " | TOPICAL_OINTMENT | Freq: Once | CUTANEOUS | Status: AC
  Administered 2014-04-04: 1 via TOPICAL
  Filled 2014-04-04: qty 30

## 2014-04-04 MED ORDER — GI COCKTAIL ~~LOC~~
30.0000 mL | Freq: Once | ORAL | Status: AC
  Administered 2014-04-04: 30 mL via ORAL
  Filled 2014-04-04: qty 30

## 2014-04-04 NOTE — MAU Note (Signed)
Pt has not taken anything for the pain and states she hasn't had a period in years

## 2014-04-04 NOTE — MAU Note (Signed)
Pt presents complaining of stomach pain that has been ongoing for at least a month. States she feels nauseous and has "real bad stomach cramps" and gets light headed. Denies vaginal bleeding or discharge. States she feels things moving in her stomach.

## 2014-04-04 NOTE — MAU Provider Note (Signed)
History     CSN: [id number]  Arrival date and time: 04/04/14 1243   None     Chief Complaint  Patient presents with  . Abdominal Pain   HPI Pt is not pregnant [id number] with c/o of med abd pain that has persisted for at least a month. Pt has been seen for infertility and was told she was premature menopause. Pt has not had a period in over a year. Pt denies vaginal discharge, itching or burning or UTI sx. Pt has not taken anything for the pain and states she hasn't had a period in years  Pt  RN note:  RN note: Sharen Hintaroline Brewer, RN Registered Nurse Signed  MAU Note Service date: 04/04/2014 1:02 PM   Pt presents complaining of stomach pain that has been ongoing for at least a month. States she feels nauseous and has "real bad stomach cramps" and gets light headed. Denies vaginal bleeding or discharge. States she feels things moving in her stomach.      Past Medical History  Diagnosis Date  . HSV (herpes simplex virus) infection     Past Surgical History  Procedure Laterality Date  . Incision and drainage abscess      on abdomen and buttocks    Family History  Problem Relation Age of Onset  . Hypertension Maternal Grandmother   . Diabetes Maternal Grandmother   . Heart disease Maternal Grandmother   . Cancer Maternal Grandmother   . Hypertension Paternal Grandmother   . Diabetes Paternal Grandmother   . Heart disease Paternal Grandmother   . Heart disease Mother     History  Substance Use Topics  . Smoking status: Never Smoker   . Smokeless tobacco: Never Used  . Alcohol Use: No    Allergies: No Known Allergies  Prescriptions prior to admission  Medication Sig Dispense Refill  . acyclovir (ZOVIRAX) 400 MG tablet Take 1 tablet (400 mg total) by mouth 4 (four) times daily.  25 tablet  0  . mupirocin ointment (BACTROBAN) 2 % Apply 1 application topically 2 (two) times daily.  30 g  0    Review of Systems  Constitutional: Negative for fever and chills.   Gastrointestinal: Positive for nausea and abdominal pain. Negative for vomiting, diarrhea and constipation.  Genitourinary: Negative for dysuria.   Physical Exam   Blood pressure 135/95, pulse 77, temperature 98.5 F (36.9 C), temperature source Oral, resp. rate 18, last menstrual period 10/22/2013.  Physical Exam  Constitutional: She is oriented to person, place, and time.  Neck: Normal range of motion.  Cardiovascular: Normal rate.   Respiratory: Effort normal.  GI: Soft. There is tenderness. There is no rebound.  Genitourinary:  Hypoestrogenic; small amount of white discharge in vault; uterus and right adnexa mildly tender- no rebound  Musculoskeletal: Normal range of motion.  Neurological: She is alert and oriented to person, place, and time.  Skin: Skin is warm and dry.  Psychiatric: She has a normal mood and affect.    MAU Course  Procedures Results for orders placed during the hospital encounter of 04/04/14 (from the past 24 hour(s))  POCT PREGNANCY, URINE     Status: None   Collection Time    04/04/14 12:59 PM      Result Value Ref Range   Preg Test, Ur NEGATIVE  NEGATIVE   Results for orders placed during the hospital encounter of 04/04/14 (from the past 24 hour(s))  URINALYSIS, ROUTINE W REFLEX MICROSCOPIC     Status: Abnormal  Collection Time    04/04/14 12:55 PM      Result Value Ref Range   Color, Urine YELLOW  YELLOW   APPearance CLEAR  CLEAR   Specific Gravity, Urine 1.025  1.005 - 1.030   pH 6.0  5.0 - 8.0   Glucose, UA NEGATIVE  NEGATIVE mg/dL   Hgb urine dipstick NEGATIVE  NEGATIVE   Bilirubin Urine NEGATIVE  NEGATIVE   Ketones, ur NEGATIVE  NEGATIVE mg/dL   Protein, ur NEGATIVE  NEGATIVE mg/dL   Urobilinogen, UA 0.2  0.0 - 1.0 mg/dL   Nitrite NEGATIVE  NEGATIVE   Leukocytes, UA TRACE (*) NEGATIVE  URINE MICROSCOPIC-ADD ON     Status: Abnormal   Collection Time    04/04/14 12:55 PM      Result Value Ref Range   Squamous Epithelial / LPF FEW (*)  RARE   WBC, UA 3-6  <3 WBC/hpf   RBC / HPF 0-2  <3 RBC/hpf   Bacteria, UA RARE  RARE   Urine-Other MUCOUS PRESENT    POCT PREGNANCY, URINE     Status: None   Collection Time    04/04/14 12:59 PM      Result Value Ref Range   Preg Test, Ur NEGATIVE  NEGATIVE  CBC     Status: None   Collection Time    04/04/14  1:34 PM      Result Value Ref Range   WBC 4.5  4.0 - 10.5 K/uL   RBC 4.47  3.87 - 5.11 MIL/uL   Hemoglobin 13.1  12.0 - 15.0 g/dL   HCT 16.139.6  09.636.0 - 04.546.0 %   MCV 88.6  78.0 - 100.0 fL   MCH 29.3  26.0 - 34.0 pg   MCHC 33.1  30.0 - 36.0 g/dL   RDW 40.913.6  81.111.5 - 91.415.5 %   Platelets 247  150 - 400 K/uL  COMPREHENSIVE METABOLIC PANEL     Status: None   Collection Time    04/04/14  1:34 PM      Result Value Ref Range   Sodium 139  137 - 147 mEq/L   Potassium 4.1  3.7 - 5.3 mEq/L   Chloride 104  96 - 112 mEq/L   CO2 27  19 - 32 mEq/L   Glucose, Bld 91  70 - 99 mg/dL   BUN 7  6 - 23 mg/dL   Creatinine, Ser 7.820.78  0.50 - 1.10 mg/dL   Calcium 8.7  8.4 - 95.610.5 mg/dL   Total Protein 8.2  6.0 - 8.3 g/dL   Albumin 3.5  3.5 - 5.2 g/dL   AST 17  0 - 37 U/L   ALT 14  0 - 35 U/L   Alkaline Phosphatase 106  39 - 117 U/L   Total Bilirubin 0.4  0.3 - 1.2 mg/dL   GFR calc non Af Amer >90  >90 mL/min   GFR calc Af Amer >90  >90 mL/min   Anion gap 8  5 - 15  LIPASE, BLOOD     Status: None   Collection Time    04/04/14  1:34 PM      Result Value Ref Range   Lipase 52  11 - 59 U/L  AMYLASE     Status: None   Collection Time    04/04/14  1:34 PM      Result Value Ref Range   Amylase 66  0 - 105 U/L  WET PREP, GENITAL  Status: Abnormal   Collection Time    04/04/14  2:17 PM      Result Value Ref Range   Yeast Wet Prep HPF POC RARE (*) NONE SEEN   Trich, Wet Prep NONE SEEN  NONE SEEN   Clue Cells Wet Prep HPF POC FEW (*) NONE SEEN   WBC, Wet Prep HPF POC FEW (*) NONE SEEN  GC/Chlamdyia pending Toradol 60mg  IM given Pt said she had to leave prior to ultrasound and completed  evaluation- pt understands Pt to return if she has increase in pain or fever, N/V Pt to follow up with the appointment with Dr. 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 Elesa Hacker HSV   Pamelia Hoit 04/04/2014, 1:07 PM

## 2014-04-05 ENCOUNTER — Encounter (HOSPITAL_COMMUNITY): Payer: Self-pay | Admitting: Emergency Medicine

## 2014-04-05 DIAGNOSIS — Z21 Asymptomatic human immunodeficiency virus [HIV] infection status: Secondary | ICD-10-CM

## 2014-04-05 DIAGNOSIS — E669 Obesity, unspecified: Secondary | ICD-10-CM | POA: Diagnosis not present

## 2014-04-05 DIAGNOSIS — R51 Headache: Secondary | ICD-10-CM | POA: Insufficient documentation

## 2014-04-05 DIAGNOSIS — I10 Essential (primary) hypertension: Secondary | ICD-10-CM | POA: Diagnosis not present

## 2014-04-05 DIAGNOSIS — R42 Dizziness and giddiness: Secondary | ICD-10-CM | POA: Diagnosis present

## 2014-04-05 LAB — GC/CHLAMYDIA PROBE AMP
CT Probe RNA: NEGATIVE
GC Probe RNA: NEGATIVE

## 2014-04-05 NOTE — Progress Notes (Unsigned)
Patient ID: Darcella Gasmanicole B Shreiner, female   DOB: 07/19/1985, 28 y.o.   MRN: [id number]  Dr Daiva EvesVan Dam spoke with patient via phone [facility]  She was asked to come to our office today.  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   She was startled initially and then begin to cry. She became so upset that she started shaking and begin to vomit.  I was there to provide emotional support and to answer any questions. Initially she had thoughts of not wanting to live but had no plans of causing harm to herself.   She insisted on brining two of her partners in today for oral quick testing.  They were both tested and had negative results via oral quick.  Each was advised to repeat testing in 4 weeks from today with condom only sex.  [facility] was here to provide support and offer services.  She will need to meet with a Case Manger at next visit. Patient will return on 03-07-14 for intake/education  and further testing.   Laurell Josephsammy K King, RN  New patient intake coordinator.

## 2014-04-05 NOTE — ED Notes (Signed)
Pt. reports lightheaded / dizzy with mild headache onset today , denies fever or chills, alert and oriented , ambulatory .

## 2014-04-06 ENCOUNTER — Ambulatory Visit (INDEPENDENT_AMBULATORY_CARE_PROVIDER_SITE_OTHER): Payer: Medicaid Other

## 2014-04-06 ENCOUNTER — Emergency Department (HOSPITAL_COMMUNITY)
Admission: EM | Admit: 2014-04-06 | Discharge: 2014-04-06 | Discharge status: Against Medical Advice | Payer: Medicaid Other | Attending: Emergency Medicine | Admitting: Emergency Medicine

## 2014-04-06 ENCOUNTER — Other Ambulatory Visit (HOSPITAL_COMMUNITY)
Admission: RE | Admit: 2014-04-06 | Discharge: 2014-04-06 | Disposition: A | Discharge status: Home / Self Care | Payer: Medicaid Other | Source: Ambulatory Visit | Attending: Infectious Diseases | Admitting: Infectious Diseases

## 2014-04-06 DIAGNOSIS — Z113 Encounter for screening for infections with a predominantly sexual mode of transmission: Secondary | ICD-10-CM | POA: Insufficient documentation

## 2014-04-06 DIAGNOSIS — B009 Herpesviral infection, unspecified: Secondary | ICD-10-CM

## 2014-04-06 DIAGNOSIS — B20 Human immunodeficiency virus [HIV] disease: Secondary | ICD-10-CM

## 2014-04-06 DIAGNOSIS — Z79899 Other long term (current) drug therapy: Secondary | ICD-10-CM

## 2014-04-06 DIAGNOSIS — Z21 Asymptomatic human immunodeficiency virus [HIV] infection status: Secondary | ICD-10-CM

## 2014-04-06 DIAGNOSIS — F4329 Adjustment disorder with other symptoms: Secondary | ICD-10-CM

## 2014-04-06 DIAGNOSIS — Z23 Encounter for immunization: Secondary | ICD-10-CM

## 2014-04-06 HISTORY — DX: Asymptomatic human immunodeficiency virus (hiv) infection status: Z21

## 2014-04-06 HISTORY — DX: Essential (primary) hypertension: I10

## 2014-04-06 HISTORY — DX: Human immunodeficiency virus (HIV) disease: B20

## 2014-04-06 HISTORY — DX: Obesity, unspecified: E66.9

## 2014-04-06 LAB — CBC WITH DIFFERENTIAL/PLATELET
BASOS PCT: 0 % (ref 0–1)
Basophils Absolute: 0 10*3/uL (ref 0.0–0.1)
Basophils Absolute: 0 10*3/uL (ref 0.0–0.1)
Basophils Relative: 0 % (ref 0–1)
EOS ABS: 0.1 10*3/uL (ref 0.0–0.7)
EOS PCT: 5 % (ref 0–5)
Eosinophils Absolute: 0.2 10*3/uL (ref 0.0–0.7)
Eosinophils Relative: 4 % (ref 0–5)
HCT: 38.4 % (ref 36.0–46.0)
HEMATOCRIT: 38.7 % (ref 36.0–46.0)
Hemoglobin: 12.7 g/dL (ref 12.0–15.0)
Hemoglobin: 12.8 g/dL (ref 12.0–15.0)
LYMPHS ABS: 0.8 10*3/uL (ref 0.7–4.0)
Lymphocytes Relative: 25 % (ref 12–46)
Lymphocytes Relative: 25 % (ref 12–46)
Lymphs Abs: 0.8 10*3/uL (ref 0.7–4.0)
MCH: 28.3 pg (ref 26.0–34.0)
MCH: 29.1 pg (ref 26.0–34.0)
MCHC: 32.8 g/dL (ref 30.0–36.0)
MCHC: 33.3 g/dL (ref 30.0–36.0)
MCV: 84.8 fL (ref 78.0–100.0)
MCV: 88.8 fL (ref 78.0–100.0)
MONO ABS: 0.5 10*3/uL (ref 0.1–1.0)
Monocytes Absolute: 0.4 10*3/uL (ref 0.1–1.0)
Monocytes Relative: 11 % (ref 3–12)
Monocytes Relative: 16 % — ABNORMAL HIGH (ref 3–12)
NEUTROS ABS: 1.9 10*3/uL (ref 1.7–7.7)
Neutro Abs: 1.6 10*3/uL — ABNORMAL LOW (ref 1.7–7.7)
Neutrophils Relative %: 54 % (ref 43–77)
Neutrophils Relative %: 60 % (ref 43–77)
PLATELETS: 274 10*3/uL (ref 150–400)
Platelets: 252 10*3/uL (ref 150–400)
RBC: 4.53 MIL/uL (ref 3.87–5.11)
RBC: 4.36 MIL/uL (ref 3.87–5.11)
RDW: 13.3 % (ref 11.5–15.5)
RDW: 13.8 % (ref 11.5–15.5)
WBC: 3.2 10*3/uL — AB (ref 4.0–10.5)
WBC: 3.1 10*3/uL — AB (ref 4.0–10.5)

## 2014-04-06 LAB — COMPLETE METABOLIC PANEL WITH GFR
ALK PHOS: 102 U/L (ref 39–117)
ALT: 15 U/L (ref 0–35)
AST: 18 U/L (ref 0–37)
Albumin: 4 g/dL (ref 3.5–5.2)
BILIRUBIN TOTAL: 0.5 mg/dL (ref 0.2–1.2)
BUN: 10 mg/dL (ref 6–23)
CO2: 27 mEq/L (ref 19–32)
Calcium: 9.5 mg/dL (ref 8.4–10.5)
Chloride: 103 mEq/L (ref 96–112)
Creat: 0.87 mg/dL (ref 0.50–1.10)
GFR, Est African American: >89 mL/min
GFR, Est Non African American: >89 mL/min
Glucose, Bld: 87 mg/dL (ref 70–99)
POTASSIUM: 3.8 meq/L (ref 3.5–5.3)
Sodium: 137 mEq/L (ref 135–145)
TOTAL PROTEIN: 8.4 g/dL — AB (ref 6.0–8.3)

## 2014-04-06 LAB — LIPID PANEL
Cholesterol: 155 mg/dL (ref 0–200)
HDL: 46 mg/dL (ref >39)
LDL Cholesterol: 90 mg/dL (ref 0–99)
Total CHOL/HDL Ratio: 3.4 Ratio
Triglycerides: 94 mg/dL (ref <150)
VLDL: 19 mg/dL (ref 0–40)

## 2014-04-06 LAB — COMPREHENSIVE METABOLIC PANEL
ALT: 15 U/L (ref 0–35)
ANION GAP: 11 (ref 5–15)
AST: 17 U/L (ref 0–37)
Albumin: 3.5 g/dL (ref 3.5–5.2)
Alkaline Phosphatase: 116 U/L (ref 39–117)
BUN: 11 mg/dL (ref 6–23)
CALCIUM: 9.3 mg/dL (ref 8.4–10.5)
CO2: 27 meq/L (ref 19–32)
Chloride: 101 mEq/L (ref 96–112)
Creatinine, Ser: 0.91 mg/dL (ref 0.50–1.10)
GFR calc Af Amer: >90 mL/min (ref >90)
GFR, EST NON AFRICAN AMERICAN: 84 mL/min — AB (ref >90)
Glucose, Bld: 89 mg/dL (ref 70–99)
Potassium: 3.5 mEq/L — ABNORMAL LOW (ref 3.7–5.3)
Sodium: 139 mEq/L (ref 137–147)
Total Bilirubin: 0.3 mg/dL (ref 0.3–1.2)
Total Protein: 9 g/dL — ABNORMAL HIGH (ref 6.0–8.3)

## 2014-04-06 NOTE — ED Notes (Signed)
Pt approached the nurse first desk and stated she was leaving. RN attempted to convince patient until she is seen by a Doctor. Pt refused, RN instructed patient to come back if her symptoms progressed, pt agreed.

## 2014-04-07 LAB — URINALYSIS
Bilirubin Urine: NEGATIVE
Glucose, UA: NEGATIVE mg/dL
Hgb urine dipstick: NEGATIVE
Ketones, ur: NEGATIVE mg/dL
LEUKOCYTES UA: NEGATIVE
NITRITE: NEGATIVE
PROTEIN: 30 mg/dL — AB
Specific Gravity, Urine: 1.017 (ref 1.005–1.030)
Urobilinogen, UA: 1 mg/dL (ref 0.0–1.0)
pH: 6 (ref 5.0–8.0)

## 2014-04-07 LAB — HIV-1 RNA ULTRAQUANT REFLEX TO GENTYP+
HIV 1 RNA QUANT: 34970 {copies}/mL — AB (ref <20)
HIV-1 RNA Quant, Log: 4.54 {Log} — ABNORMAL HIGH (ref <1.30)

## 2014-04-07 LAB — RPR

## 2014-04-07 LAB — HEPATITIS B CORE ANTIBODY, TOTAL: HEP B C TOTAL AB: NONREACTIVE

## 2014-04-07 LAB — HEPATITIS A ANTIBODY, TOTAL: HEP A TOTAL AB: NONREACTIVE

## 2014-04-07 LAB — HEPATITIS B SURFACE ANTIGEN: Hepatitis B Surface Ag: NEGATIVE

## 2014-04-07 LAB — HEPATITIS B SURFACE ANTIBODY,QUALITATIVE: HEP B S AB: POSITIVE — AB

## 2014-04-07 LAB — HEPATITIS C ANTIBODY: HCV Ab: NEGATIVE

## 2014-04-07 NOTE — Progress Notes (Signed)
Patient here today for intake after receiving the news of positive HIV test on 04-04-14.  She is still very depressed and not able to attend work.  She was taken to ED last night due to fainting spells but did not stay due to wait time.  Her recent two female partners have both tested negative via oral quick and she is still trying to figure out if the source is  her 29 year old child's father.    Sexual history : currently sexually active with two males on a regular basis for last 2 -3 years. Sex with other partner about 5 months ago.  I have suggested counseling to assist with the anxiety and depression is having and she has accepted.  She is accompanied by her best friend for support who states the depression with Brenda Tyler is really bad.  [facility].  Brenda Tyler does not express any plans of causing harm to herself or others today.  [facility] pain which continues to be present today along with a lump in right breast area.  Sexual history : currently sexually active with two males on a regular basis.  No medical records to request.  Vaccines updated

## 2014-04-08 ENCOUNTER — Ambulatory Visit: Payer: Medicaid Other

## 2014-04-08 LAB — URINE CYTOLOGY ANCILLARY ONLY
Chlamydia: NEGATIVE
NEISSERIA GONORRHEA: NEGATIVE

## 2014-04-08 LAB — T-HELPER CELL (CD4) - (RCID CLINIC ONLY)
CD4 T CELL HELPER: 21 % — AB (ref 33–55)
CD4 T Cell Abs: 190 /uL — ABNORMAL LOW (ref 400–2700)

## 2014-04-08 MED ORDER — SULFAMETHOXAZOLE-TMP DS 800-160 MG PO TABS: 1.0000 | ORAL_TABLET | ORAL | Status: DC

## 2014-04-09 ENCOUNTER — Emergency Department (HOSPITAL_COMMUNITY)
Admission: EM | Admit: 2014-04-09 | Discharge: 2014-04-09 | Disposition: A | Discharge status: Home / Self Care | Payer: Medicaid Other | Attending: Emergency Medicine | Admitting: Emergency Medicine

## 2014-04-09 ENCOUNTER — Emergency Department (HOSPITAL_COMMUNITY): Payer: Medicaid Other

## 2014-04-09 ENCOUNTER — Encounter (HOSPITAL_COMMUNITY): Payer: Self-pay | Admitting: Emergency Medicine

## 2014-04-09 DIAGNOSIS — R1084 Generalized abdominal pain: Secondary | ICD-10-CM

## 2014-04-09 DIAGNOSIS — Z8619 Personal history of other infectious and parasitic diseases: Secondary | ICD-10-CM | POA: Diagnosis not present

## 2014-04-09 DIAGNOSIS — D72819 Decreased white blood cell count, unspecified: Secondary | ICD-10-CM | POA: Insufficient documentation

## 2014-04-09 DIAGNOSIS — Z3202 Encounter for pregnancy test, result negative: Secondary | ICD-10-CM | POA: Insufficient documentation

## 2014-04-09 DIAGNOSIS — I10 Essential (primary) hypertension: Secondary | ICD-10-CM | POA: Insufficient documentation

## 2014-04-09 DIAGNOSIS — I880 Nonspecific mesenteric lymphadenitis: Secondary | ICD-10-CM | POA: Diagnosis not present

## 2014-04-09 DIAGNOSIS — Z21 Asymptomatic human immunodeficiency virus [HIV] infection status: Secondary | ICD-10-CM | POA: Diagnosis not present

## 2014-04-09 DIAGNOSIS — R110 Nausea: Secondary | ICD-10-CM | POA: Diagnosis not present

## 2014-04-09 DIAGNOSIS — E669 Obesity, unspecified: Secondary | ICD-10-CM | POA: Diagnosis not present

## 2014-04-09 DIAGNOSIS — F329 Major depressive disorder, single episode, unspecified: Secondary | ICD-10-CM | POA: Insufficient documentation

## 2014-04-09 DIAGNOSIS — N898 Other specified noninflammatory disorders of vagina: Secondary | ICD-10-CM | POA: Insufficient documentation

## 2014-04-09 DIAGNOSIS — N390 Urinary tract infection, site not specified: Secondary | ICD-10-CM

## 2014-04-09 DIAGNOSIS — R100 Acute abdomen: Secondary | ICD-10-CM | POA: Diagnosis present

## 2014-04-09 LAB — CBC WITH DIFFERENTIAL/PLATELET
BASOS ABS: 0 10*3/uL (ref 0.0–0.1)
Basophils Relative: 0 % (ref 0–1)
Eosinophils Absolute: 0.1 10*3/uL (ref 0.0–0.7)
Eosinophils Relative: 3 % (ref 0–5)
HCT: 39.7 % (ref 36.0–46.0)
Hemoglobin: 12.8 g/dL (ref 12.0–15.0)
LYMPHS PCT: 27 % (ref 12–46)
Lymphs Abs: 0.9 10*3/uL (ref 0.7–4.0)
MCH: 28.1 pg (ref 26.0–34.0)
MCHC: 32.2 g/dL (ref 30.0–36.0)
MCV: 87.1 fL (ref 78.0–100.0)
Monocytes Absolute: 0.5 10*3/uL (ref 0.1–1.0)
Monocytes Relative: 16 % — ABNORMAL HIGH (ref 3–12)
NEUTROS ABS: 1.7 10*3/uL (ref 1.7–7.7)
NEUTROS PCT: 54 % (ref 43–77)
PLATELETS: 221 10*3/uL (ref 150–400)
RBC: 4.56 MIL/uL (ref 3.87–5.11)
RDW: 13 % (ref 11.5–15.5)
WBC: 3.1 10*3/uL — AB (ref 4.0–10.5)

## 2014-04-09 LAB — COMPREHENSIVE METABOLIC PANEL
ALT: 17 U/L (ref 0–35)
AST: 18 U/L (ref 0–37)
Albumin: 3.7 g/dL (ref 3.5–5.2)
Alkaline Phosphatase: 124 U/L — ABNORMAL HIGH (ref 39–117)
Anion gap: 13 (ref 5–15)
BUN: 11 mg/dL (ref 6–23)
CALCIUM: 9.1 mg/dL (ref 8.4–10.5)
CHLORIDE: 104 meq/L (ref 96–112)
CO2: 24 mEq/L (ref 19–32)
Creatinine, Ser: 0.82 mg/dL (ref 0.50–1.10)
GFR calc Af Amer: >90 mL/min (ref >90)
GFR calc non Af Amer: >90 mL/min (ref >90)
GLUCOSE: 85 mg/dL (ref 70–99)
Potassium: 4 mEq/L (ref 3.7–5.3)
SODIUM: 141 meq/L (ref 137–147)
Total Bilirubin: 0.4 mg/dL (ref 0.3–1.2)
Total Protein: 8.8 g/dL — ABNORMAL HIGH (ref 6.0–8.3)

## 2014-04-09 LAB — QUANTIFERON TB GOLD ASSAY (BLOOD)
Interferon Gamma Release Assay: NEGATIVE
MITOGEN VALUE: 0.76 [IU]/mL
Quantiferon Nil Value: 0.08 IU/mL
Quantiferon Tb Ag Minus Nil Value: 0 IU/mL
TB Ag value: 0.07 IU/mL

## 2014-04-09 LAB — URINALYSIS, ROUTINE W REFLEX MICROSCOPIC
BILIRUBIN URINE: NEGATIVE
GLUCOSE, UA: NEGATIVE mg/dL
Hgb urine dipstick: NEGATIVE
Ketones, ur: NEGATIVE mg/dL
NITRITE: NEGATIVE
Protein, ur: NEGATIVE mg/dL
SPECIFIC GRAVITY, URINE: 1.022 (ref 1.005–1.030)
Urobilinogen, UA: 1 mg/dL (ref 0.0–1.0)
pH: 6.5 (ref 5.0–8.0)

## 2014-04-09 LAB — URINE MICROSCOPIC-ADD ON

## 2014-04-09 LAB — POC URINE PREG, ED: PREG TEST UR: NEGATIVE

## 2014-04-09 LAB — LIPASE, BLOOD: Lipase: 55 U/L (ref 11–59)

## 2014-04-09 MED ORDER — HYDROCODONE-ACETAMINOPHEN 5-325 MG PO TABS: 1.0000 | ORAL_TABLET | Freq: Four times a day (QID) | ORAL | Status: DC | PRN

## 2014-04-09 MED ORDER — SODIUM CHLORIDE 0.9 % IV BOLUS (SEPSIS)
1000.0000 mL | Freq: Once | INTRAVENOUS | Status: AC
  Administered 2014-04-09: 1000 mL via INTRAVENOUS

## 2014-04-09 MED ORDER — IOHEXOL 300 MG/ML  SOLN
50.0000 mL | Freq: Once | INTRAMUSCULAR | Status: AC | PRN
  Administered 2014-04-09: 50 mL via ORAL

## 2014-04-09 MED ORDER — FLUCONAZOLE 150 MG PO TABS
150.0000 mg | ORAL_TABLET | Freq: Once | ORAL | Status: AC
Start: 2014-04-09 — End: 2014-04-09
  Administered 2014-04-09: 150 mg via ORAL
  Filled 2014-04-09: qty 1

## 2014-04-09 MED ORDER — CEPHALEXIN 500 MG PO CAPS: ORAL_CAPSULE | ORAL | Status: DC

## 2014-04-09 MED ORDER — IOHEXOL 300 MG/ML  SOLN
100.0000 mL | Freq: Once | INTRAMUSCULAR | Status: AC | PRN
  Administered 2014-04-09: 100 mL via INTRAVENOUS

## 2014-04-09 MED ORDER — ONDANSETRON HCL 8 MG PO TABS: 8.0000 mg | ORAL_TABLET | Freq: Three times a day (TID) | ORAL | Status: DC | PRN

## 2014-04-09 MED ORDER — MORPHINE SULFATE 4 MG/ML IJ SOLN
4.0000 mg | Freq: Once | INTRAMUSCULAR | Status: AC
  Administered 2014-04-09: 4 mg via INTRAVENOUS
  Filled 2014-04-09: qty 1

## 2014-04-09 NOTE — ED Provider Notes (Signed)
Medical screening examination/treatment/procedure(s) were performed by non-physician practitioner and as supervising physician I was immediately available for consultation/collaboration.  Flint MelterElliott L Naquita Nappier, MD 04/09/14 [phone]

## 2014-04-09 NOTE — ED Notes (Signed)
Pt reports mid abdominal pain with nausea, sweating and "feeling like I'm going to pass out". Pt sts she would like to see doctor prior to blood being drawn, since she had lab work done 2 days ago. Pt then reports she got some bad news after that and she is very anxious and depressed due to that. Pt is tearful during assessment.

## 2014-04-09 NOTE — Discharge Instructions (Signed)
Your abdominal pain is related to inflammation in the lymph nodes in your belly. Use ibuprofen or norco as directed, as needed, but don't drive or operate heavy machinery while taking norco. Use zofran as needed for nausea. STAY HYDRATED WITH WATER!! Use keflex for your possible urinary tract infection. See your infectious disease doctor next week for ongoing management. Return to the ER for changes or worsening symptoms.   Swollen Lymph Nodes The lymphatic system filters fluid from around cells. It is like a system of blood vessels. These channels carry lymph instead of blood. The lymphatic system is an important part of the immune (disease fighting) system. When people talk about "swollen glands in the neck," they are usually talking about swollen lymph nodes. The lymph nodes are like the little traps for infection. You and your caregiver may be able to feel lymph nodes, especially swollen nodes, in these common areas: the groin (inguinal area), armpits (axilla), and above the clavicle (supraclavicular). You may also feel them in the neck (cervical) and the back of the head just above the hairline (occipital). Swollen glands occur when there is any condition in which the body responds with an allergic type of reaction. For instance, the glands in the neck can become swollen from insect bites or any type of minor infection on the head. These are very noticeable in children with only minor problems. Lymph nodes may also become swollen when there is a tumor or problem with the lymphatic system, such as Hodgkin's disease. TREATMENT   Most swollen glands do not require treatment. They can be observed (watched) for a short period of time, if your caregiver feels it is necessary. Most of the time, observation is not necessary.  Antibiotics (medicines that kill germs) may be prescribed by your caregiver. Your caregiver may prescribe these if he or she feels the swollen glands are due to a bacterial (germ)  infection. Antibiotics are not used if the swollen glands are caused by a virus. HOME CARE INSTRUCTIONS   Take medications as directed by your caregiver. Only take over-the-counter or prescription medicines for pain, discomfort, or fever as directed by your caregiver. SEEK MEDICAL CARE IF:   If you begin to run a temperature greater than 102 F (38.9 C), or as your caregiver suggests. MAKE SURE YOU:   Understand these instructions.  Will watch your condition.  Will get help right away if you are not doing well or get worse. Document Released: 06/01/2002 Document Revised: 09/03/2011 Document Reviewed: 06/11/2005 [facility] Patient Information 2015 [place] [place]. This information is not intended to replace advice given to you by your health care provider. Make sure you discuss any questions you have with your health care provider.  Nausea, Adult Nausea means you feel sick to your stomach or need to throw up (vomit). It may be a sign of a more serious problem. If nausea gets worse, you may throw up. If you throw up a lot, you may lose too much body fluid (dehydration). HOME CARE   Get plenty of rest.  Ask your doctor how to replace body fluid losses (rehydrate).  Eat small amounts of food. Sip liquids more often.  Take all medicines as told by your doctor. GET HELP RIGHT AWAY IF:  You have a fever.  You pass out (faint).  You keep throwing up or have blood in your throw up.  You are very weak, have dry lips or a dry mouth, or you are very thirsty (dehydrated).  You have dark  or bloody poop (stool).  You have very bad chest or belly (abdominal) pain.  You do not get better after 2 days, or you get worse.  You have a headache. MAKE SURE YOU:  Understand these instructions.  Will watch your condition.  Will get help right away if you are not doing well or get worse. Document Released: 05/31/2011 Document Revised: 09/03/2011 Document Reviewed: 05/31/2011 [employer] [facility]  Patient Information 2015 [place], [place]. This information is not intended to replace advice given to you by your health care provider. Make sure you discuss any questions you have with your health care provider.  Urinary Tract Infection Urinary tract infections (UTIs) can develop anywhere along your urinary tract. Your urinary tract is your body's drainage system for removing wastes and extra water. Your urinary tract includes two kidneys, two ureters, a bladder, and a urethra. Your kidneys are a pair of bean-shaped organs. Each kidney is about the size of your fist. They are located below your ribs, one on each side of your spine. CAUSES Infections are caused by microbes, which are microscopic organisms, including fungi, viruses, and bacteria. These organisms are so small that they can only be seen through a microscope. Bacteria are the microbes that most commonly cause UTIs. SYMPTOMS  Symptoms of UTIs may vary by age and gender of the patient and by the location of the infection. Symptoms in young women typically include a frequent and intense urge to urinate and a painful, burning feeling in the bladder or urethra during urination. Older women and men are more likely to be tired, shaky, and weak and have muscle aches and abdominal pain. A fever may mean the infection is in your kidneys. Other symptoms of a kidney infection include pain in your back or sides below the ribs, nausea, and vomiting. DIAGNOSIS To diagnose a UTI, your caregiver will ask you about your symptoms. Your caregiver also will ask to provide a urine sample. The urine sample will be tested for bacteria and white blood cells. White blood cells are made by your body to help fight infection. TREATMENT  Typically, UTIs can be treated with medication. Because most UTIs are caused by a bacterial infection, they usually can be treated with the use of antibiotics. The choice of antibiotic and length of treatment depend on your symptoms and  the type of bacteria causing your infection. HOME CARE INSTRUCTIONS  If you were prescribed antibiotics, take them exactly as your caregiver instructs you. Finish the medication even if you feel better after you have only taken some of the medication.  Drink enough water and fluids to keep your urine clear or pale yellow.  Avoid caffeine, tea, and carbonated beverages. They tend to irritate your bladder.  Empty your bladder often. Avoid holding urine for long periods of time.  Empty your bladder before and after sexual intercourse.  After a bowel movement, women should cleanse from front to back. Use each tissue only once. SEEK MEDICAL CARE IF:   You have back pain.  You develop a fever.  Your symptoms do not begin to resolve within 3 days. SEEK IMMEDIATE MEDICAL CARE IF:   You have severe back pain or lower abdominal pain.  You develop chills.  You have nausea or vomiting.  You have continued burning or discomfort with urination. MAKE SURE YOU:   Understand these instructions.  Will watch your condition.  Will get help right away if you are not doing well or get worse. Document Released: 03/21/2005 Document Revised: 12/11/2011  Document Reviewed: 07/20/2011 [facility] Patient Information 2015 [place], [place]. This information is not intended to replace advice given to you by your health care provider. Make sure you discuss any questions you have with your health care provider.  Mesenteric Adenitis Mesenteric adenitis is an inflammation of lymph nodes (glands) in the abdomen. It may appear to mimic appendicitis symptoms. It is most common in children. The cause of this may be an infection somewhere else in the body. It usually gets well without treatment but can cause problems for up to a couple weeks. SYMPTOMS  The most common problems are:  Fever.  Abdominal pain and tenderness.  Nausea, vomiting, and/or diarrhea. DIAGNOSIS  Your caregiver may have an idea what is  wrong by examining you or your child. Sometimes lab work and other studies such as Ultrasonography and a CT scan of the abdomen are done.  TREATMENT  Children with mesenteric adenitis will get well without further treatment. Treatment includes rest, pain medications, and fluids. HOME CARE INSTRUCTIONS   Do not take or give laxatives unless ordered by your caregiver.  Use pain medications as directed.  Follow the diet recommended by your caregiver. SEEK IMMEDIATE MEDICAL CARE IF:   The pain does not go away or becomes severe.  An oral temperature above 102 F (38.9 C) develops.  Repeated vomiting occurs.  The pain becomes localized in the right lower quadrant of the abdomen (possibly appendicitis).  You or your child notice bright red or black tarry stools. MAKE SURE YOU:   Understand these instructions.  Will watch your condition.  Will get help right away if you are not doing well or get worse. Document Released: 03/15/2006 Document Revised: 09/03/2011 Document Reviewed: 09/16/2013 [facility] Patient Information 2015 [place], [place]. This information is not intended to replace advice given to you by your health care provider. Make sure you discuss any questions you have with your health care provider.

## 2014-04-09 NOTE — ED Notes (Signed)
Pt c/o abd pain for past month. Pt states that she gets sweats and nauseated when the pain comes on.

## 2014-04-09 NOTE — ED Provider Notes (Signed)
CSN: [id number]     Arrival date & time 04/09/14  1746 History   First MD Initiated Contact with Patient 04/09/14 1834     Chief Complaint  Patient presents with  . Abdominal Pain     (Consider location/radiation/quality/duration/timing/severity/associated sxs/prior Treatment) HPI Comments: Brenda Tyler is a 28 y.o. female with a PMHx of HTN, obesity, and recently diagnosed with HIV, who presents with ongoing generalized abd pain x1 month, unchanged since she was seen by [facility] on 04/04/14 but left before U/S was performed. When she was seen there, they performed STD check and she came back positive for HIV. Otherwise her work up was negative, per pt. States her abd pain is generalized, constant, sharp, 10/10, nonradiating, without any known aggravating or alleviating factors, hasn't tried anything for it. Endorses intermittent nausea without emesis which is not currently present, as well as some dizziness (lightheadedness) ongoing for a few weeks. States she doesn't drink much water. Denies any fevers, chills, CP, SOB, cough, URI symptoms, diarrhea, constipation, hematuria, dysuria, flank pain, back pain, hematochezia, melena, numbness, tingling, weakness, focal neuro deficits, EtOH use, NSAID use, recent travel, sick contacts, myalgias or arthralgias.  Also reports vaginal itching and thick white discharge, states she knows she has a yeast infection, reports it began yesterday. Sexually active with 3 partners in last year, unprotected. Denies vaginal bleeding or dyspareunia. No recent douching or new soaps.  Patient is a 28 y.o. female presenting with abdominal pain. The history is provided by the patient. No language interpreter was used.  Abdominal Pain Pain location:  Generalized Pain quality: sharp   Pain radiates to:  Does not radiate Pain severity:  Severe (10/10) Onset quality:  Gradual Duration:  4 weeks Timing:  Constant Progression:  Unchanged Chronicity:  New Context:  recent illness (recently diagnosed with HIV)   Context: not recent travel, not sick contacts and not suspicious food intake   Relieved by:  None tried Worsened by:  Nothing tried Ineffective treatments:  None tried Associated symptoms: nausea and vaginal discharge   Associated symptoms: no anorexia, no chest pain, no chills, no constipation, no cough, no diarrhea, no dysuria, no fatigue, no fever, no flatus, no hematemesis, no hematochezia, no hematuria, no melena, no shortness of breath, no sore throat, no vaginal bleeding and no vomiting   Risk factors: obesity   Risk factors: no alcohol abuse, has not had multiple surgeries and no NSAID use     Past Medical History  Diagnosis Date  . HSV (herpes simplex virus) infection   . HIV (human immunodeficiency virus infection)   . Hypertension   . Obesity    Past Surgical History  Procedure Laterality Date  . Incision and drainage abscess      on abdomen and buttocks   Family History  Problem Relation Age of Onset  . Hypertension Maternal Grandmother   . Diabetes Maternal Grandmother   . Heart disease Maternal Grandmother   . Cancer Maternal Grandmother   . Hypertension Paternal Grandmother   . Diabetes Paternal Grandmother   . Heart disease Paternal Grandmother   . Heart disease Mother    History  Substance Use Topics  . Smoking status: Never Smoker   . Smokeless tobacco: Never Used  . Alcohol Use: 1.0 oz/week    2 drink(s) per week   OB History   Grav Para Term Preterm Abortions TAB SAB Ect Mult Living   4 3 3  1 1     3  Review of Systems  Constitutional: Negative for fever, chills and fatigue.  HENT: Negative for rhinorrhea, sore throat and tinnitus.   Eyes: Negative for visual disturbance.  Respiratory: Negative for cough and shortness of breath.   Cardiovascular: Negative for chest pain.  Gastrointestinal: Positive for nausea and abdominal pain. Negative for vomiting, diarrhea, constipation, blood in stool,  melena, hematochezia, abdominal distention, anorexia, flatus and hematemesis.  Genitourinary: Positive for vaginal discharge. Negative for dysuria, frequency, hematuria, flank pain, decreased urine volume, vaginal bleeding, vaginal pain and dyspareunia.  Musculoskeletal: Negative for arthralgias, back pain and myalgias.  Skin: Negative for color change.  Neurological: Positive for dizziness and light-headedness. Negative for syncope, weakness, numbness and headaches.    10 Systems reviewed and are negative for acute change except as noted in the HPI.   Allergies  Review of patient's allergies indicates no known allergies.  Home Medications   Prior to Admission medications   Medication Sig Start Date End Date Taking? Authorizing Provider  sulfamethoxazole-trimethoprim (BACTRIM DS) 800-160 MG per tablet Take 1 tablet by mouth 3 (three) times a week. 04/08/14  Yes Ginnie SmartJeffrey C Hatcher, MD   BP 136/86  Pulse 73  Temp(Src) 97.9 F (36.6 C) (Oral)  Resp 18  SpO2 100%  LMP 10/22/2013 Physical Exam  Nursing note and vitals reviewed. Constitutional: She is oriented to person, place, and time. Vital signs are normal. She appears well-developed and well-nourished. No distress.  Afebrile, nontoxic, NAD  HENT:  Head: Normocephalic and atraumatic.  Mouth/Throat: Oropharynx is clear and moist. Mucous membranes are dry (mildly).  Mildly dry mucous membranes  Eyes: Conjunctivae and EOM are normal. Pupils are equal, round, and reactive to light. Right eye exhibits no discharge. Left eye exhibits no discharge.  PERRL, EOMI without nystagmus  Neck: Normal range of motion. Neck supple.  Cardiovascular: Normal rate, regular rhythm, normal heart sounds and intact distal pulses.  Exam reveals no gallop and no friction rub.   No murmur heard. Pulmonary/Chest: Effort normal and breath sounds normal. No respiratory distress. She has no decreased breath sounds. She has no wheezes. She has no rhonchi. She has  no rales.  Abdominal: Soft. Normal appearance and bowel sounds are normal. She exhibits no distension. There is generalized tenderness. There is no rigidity, no rebound, no guarding, no CVA tenderness, no tenderness at McBurney's point and negative Murphy's sign.  Diffusely tender throughout, obese abdomen. Soft, nondistended, +BS throughout. No r/g/r, neg murphy's, neg mcburney's. No CVA TTP  Musculoskeletal: Normal range of motion.  MAE x4  Neurological: She is alert and oriented to person, place, and time. She has normal strength. No sensory deficit. Gait normal.  Strength 5/5 in all extremities, sensation grossly intact in all extremities, gait nonataxic and ambulatory without issue  Skin: Skin is warm, dry and intact. No rash noted.  Psychiatric: She exhibits a depressed mood.  Mildly depressed appearing, tearful but denies SI    ED Course  Procedures (including critical care time) Labs Review Labs Reviewed  CBC WITH DIFFERENTIAL - Abnormal; Notable for the following:    WBC 3.1 (*)    Monocytes Relative 16 (*)    All other components within normal limits  COMPREHENSIVE METABOLIC PANEL - Abnormal; Notable for the following:    Total Protein 8.8 (*)    Alkaline Phosphatase 124 (*)    All other components within normal limits  URINALYSIS, ROUTINE W REFLEX MICROSCOPIC - Abnormal; Notable for the following:    APPearance CLOUDY (*)    Leukocytes,  UA MODERATE (*)    All other components within normal limits  URINE MICROSCOPIC-ADD ON - Abnormal; Notable for the following:    Squamous Epithelial / LPF FEW (*)    Bacteria, UA FEW (*)    All other components within normal limits  URINE CULTURE  LIPASE, BLOOD  POC URINE PREG, ED    Imaging Review Ct Abdomen Pelvis W Contrast  04/09/2014   CLINICAL DATA:  Initial evaluation for diffuse abdominal pain for 1 month, accompanied by swelling and nausea  EXAM: CT ABDOMEN AND PELVIS WITH CONTRAST  TECHNIQUE: Multidetector CT imaging of the  abdomen and pelvis was performed using the standard protocol following bolus administration of intravenous contrast.  CONTRAST:  50mL OMNIPAQUE IOHEXOL 300 MG/ML SOLN, OMNIPAQUE IOHEXOL 300 MG/ML SOLN  COMPARISON:  None.  FINDINGS: Liver is normal. There is a 4 mm gallstone. There is no inflammatory change around the gallbladder. Pancreas is normal. Spleen is normal. Adrenal glands are normal. Kidneys are normal.  Abdominal aorta is normal. Bladder is normal. Reproductive organs are normal. There is no free fluid.  Stomach is normal. Small bowel is normal. There is mild diverticulosis of the sigmoid colon but the large bowel is otherwise normal. Appendix is normal.  There are right lower quadrant enlarged lymph nodes. These measure up to 14 mm in diameter in in the are numerous. There are smaller central mesenteric lymph nodes, the largest measuring 9 mm.  Visualized portions of the lung bases clear. There are no acute musculoskeletal findings.  IMPRESSION: 1. 4 mm gallstone 2. Mesenteric adenopathy, most prominent in the right lower quadrant. This could indicate mesenteric adenitis, nonspecific diagnosis. Other possibilities such as lymphoma could be considered.   Electronically Signed   By: Esperanza Heir M.D.   On: 04/09/2014 20:54     EKG Interpretation None      MDM   Final diagnoses:  Generalized abdominal pain  Nausea  Leukopenia  UTI (lower urinary tract infection)  Nonspecific mesenteric adenitis     28y/o female with chronic abd pain x1 month. Recently seen at [facility], diagnosed with HIV, neg GC/CT/Syphilis, but wet prep showed +yeast, will give diflucan today given clinical symptoms. Will defer another pelvic exam. Women's wanted to do u/s due to R adnexal tenderness, but given diffuse tenderness, will opt for CT abd/pelvis. Mildly dry mucous membranes, dizziness likely related to dehydration, will give fluids while awaiting labs and CT. Pt declined nausea or pain meds today.  Neuro exam benign, doubt need for head CT. Will reassess shortly.  9:45 PM CBC w/diff showing chronically low WBC at 3.1, monocytes 16%, but otherwise WNL. CMP showing mildly elevated AlkPhos at 124, likely related to dehydration, otherwise WNL. Lipase WNL. Upreg neg. U/A showing cloudy +leuks with few epithelial and few bacterial but 7-10 WBC. Given pt's complaints, will send for culture and treat with keflex. CT showing 4mm gallstone without cholecystitis, could be etiology of some pain but unlikely to be sole source. CT also showing mesenteric adenopathy, most prominent in RLQ. Likely mesenteric adenitis due to viral illness. Will give pain meds and nausea meds for home as well as bactrim for UTI. Will have her f/up with infectious dz doctor, has appt next week. Will give pain meds now prior to d/c. Encouraged fluids and rest. I explained the diagnosis and have given explicit precautions to return to the ER including for any other new or worsening symptoms. The patient understands and accepts the medical plan as it's been  dictated and I have answered their questions. Discharge instructions concerning home care and prescriptions have been given. The patient is STABLE and is discharged to home in good condition.   BP 129/71  Pulse 67  Temp(Src) 97.8 F (36.6 C) (Oral)  Resp 20  SpO2 100%  LMP 10/22/2013  Meds ordered this encounter  Medications  . fluconazole (DIFLUCAN) tablet 150 mg    Sig:   . sodium chloride 0.9 % bolus 1,000 mL    Sig:   . iohexol (OMNIPAQUE) 300 MG/ML solution 50 mL    Sig:   . iohexol (OMNIPAQUE) 300 MG/ML solution 100 mL    Sig:   . cephALEXin (KEFLEX) 500 MG capsule    Sig: 2 caps po bid x 7 days    Dispense:  28 capsule    Refill:  0    Order Specific Question:  Supervising Provider    Answer:  Eber HongMILLER, BRIAN D [[id number]]  . HYDROcodone-acetaminophen (NORCO) 5-325 MG per tablet    Sig: Take 1-2 tablets by mouth every 6 (six) hours as needed for severe pain.     Dispense:  20 tablet    Refill:  0    Order Specific Question:  Supervising Provider    Answer:  Eber HongMILLER, BRIAN D [[id number]]  . ondansetron (ZOFRAN) 8 MG tablet    Sig: Take 1 tablet (8 mg total) by mouth every 8 (eight) hours as needed for nausea or vomiting.    Dispense:  10 tablet    Refill:  0    Order Specific Question:  Supervising Provider    Answer:  Eber HongMILLER, BRIAN D [[id number]]  . morphine 4 MG/ML injection 4 mg    Sig:       Donnita FallsMercedes Strupp Oldenburgamprubi-Soms, PA-C 04/09/14 2211

## 2014-04-09 NOTE — ED Notes (Signed)
Pt is alert and oriented in from home,  She was recently diagnosed with HIV and has been depressed but states she is here for abdominal pain 10/10,  Pt given warm blankets for comfort measures,  NAD

## 2014-04-10 LAB — HLA B*5701: HLA-B 5701 W/RFLX HLA-B HIGH: NEGATIVE

## 2014-04-11 LAB — URINE CULTURE: Colony Count: 40000

## 2014-04-12 ENCOUNTER — Ambulatory Visit: Payer: Medicaid Other

## 2014-04-14 ENCOUNTER — Other Ambulatory Visit: Payer: Self-pay | Admitting: *Deleted

## 2014-04-14 DIAGNOSIS — B20 Human immunodeficiency virus [HIV] disease: Secondary | ICD-10-CM

## 2014-04-14 MED ORDER — SULFAMETHOXAZOLE-TMP DS 800-160 MG PO TABS: 1.0000 | ORAL_TABLET | ORAL | Status: DC

## 2014-04-14 NOTE — Telephone Encounter (Signed)
ADAP Application 

## 2014-04-16 LAB — HIV-1 GENOTYPR PLUS

## 2014-04-23 ENCOUNTER — Ambulatory Visit (INDEPENDENT_AMBULATORY_CARE_PROVIDER_SITE_OTHER): Payer: Medicaid Other | Admitting: Infectious Diseases

## 2014-04-23 ENCOUNTER — Encounter: Payer: Self-pay | Admitting: Infectious Diseases

## 2014-04-23 ENCOUNTER — Other Ambulatory Visit: Payer: Self-pay | Admitting: *Deleted

## 2014-04-23 VITALS — BP 130/90 | HR 73 | Temp 98.2°F | Ht 65.0 in | Wt 287.0 lb

## 2014-04-23 DIAGNOSIS — B20 Human immunodeficiency virus [HIV] disease: Secondary | ICD-10-CM | POA: Insufficient documentation

## 2014-04-23 DIAGNOSIS — Z21 Asymptomatic human immunodeficiency virus [HIV] infection status: Secondary | ICD-10-CM

## 2014-04-23 DIAGNOSIS — R87613 High grade squamous intraepithelial lesion on cytologic smear of cervix (HGSIL): Secondary | ICD-10-CM

## 2014-04-23 MED ORDER — SULFAMETHOXAZOLE-TMP DS 800-160 MG PO TABS: 1.0000 | ORAL_TABLET | ORAL | Status: DC

## 2014-04-23 MED ORDER — ELVITEG-COBIC-EMTRICIT-TENOFDF 150-150-200-300 MG PO TABS: 1.0000 | ORAL_TABLET | Freq: Every day | ORAL | Status: DC

## 2014-04-23 NOTE — Progress Notes (Signed)
   Subjective:    Patient ID: Brenda Tyler, female    DOB: 03/14/1986, 28 y.o.   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 She was also recently found to have HIV+. No other recent illnesses.   SocHx/FHx reviewed.   Review of Systems  Constitutional: Positive for chills. Negative for fever, appetite change and unexpected weight change.  HENT: Negative for sore throat.   Respiratory: Negative for cough and shortness of breath.   Gastrointestinal: Positive for abdominal pain. Negative for diarrhea and constipation.  Genitourinary: Positive for menstrual problem. Negative for difficulty urinating.  Neurological: Positive for headaches.  Psychiatric/Behavioral: Positive for dysphoric mood.  LMP 5 years ago.      Objective:   Physical Exam  Constitutional: She appears well-developed and well-nourished.  HENT:  Mouth/Throat: No oropharyngeal exudate.  Eyes: EOM are normal. Pupils are equal, round, and reactive to light.  Neck: Neck supple.  Cardiovascular: Normal rate, regular rhythm and normal heart sounds.   Pulmonary/Chest: Effort normal and breath sounds normal.  Abdominal: Soft. Bowel sounds are normal. She exhibits no distension. There is no tenderness.  Musculoskeletal: She exhibits no edema.  Lymphadenopathy:    She has no cervical adenopathy.          Assessment & Plan:

## 2014-04-23 NOTE — Assessment & Plan Note (Addendum)
She had eval in September. Pt is unaware of her f/u appt. Will try to get her a f/u appt.

## 2014-04-23 NOTE — Assessment & Plan Note (Addendum)
She does not think she has insurance, has medicaid. Will try to get her started on stribild. I'll start her on bactrim prophylaxis.  She gets condoms, she gets flu shot, she gets pnvx.  She is mostly [facility], she had LAN on her abd CT. I let her know that getting her onto ART may improve her abd pain by getting rid of her LAN. I also let her know that we won't be prescribing her narcotics. If we need, we will get her into PCP for eval of her abd pain.

## 2014-04-25 ENCOUNTER — Emergency Department (HOSPITAL_COMMUNITY)
Admission: EM | Admit: 2014-04-25 | Discharge: 2014-04-25 | Disposition: A | Discharge status: Home / Self Care | Payer: Medicaid Other | Attending: Emergency Medicine | Admitting: Emergency Medicine

## 2014-04-25 ENCOUNTER — Emergency Department (HOSPITAL_COMMUNITY): Payer: Medicaid Other

## 2014-04-25 ENCOUNTER — Encounter (HOSPITAL_COMMUNITY): Payer: Self-pay | Admitting: Emergency Medicine

## 2014-04-25 DIAGNOSIS — R748 Abnormal levels of other serum enzymes: Secondary | ICD-10-CM | POA: Diagnosis not present

## 2014-04-25 DIAGNOSIS — Z792 Long term (current) use of antibiotics: Secondary | ICD-10-CM | POA: Insufficient documentation

## 2014-04-25 DIAGNOSIS — Z79899 Other long term (current) drug therapy: Secondary | ICD-10-CM | POA: Diagnosis not present

## 2014-04-25 DIAGNOSIS — Z21 Asymptomatic human immunodeficiency virus [HIV] infection status: Secondary | ICD-10-CM | POA: Insufficient documentation

## 2014-04-25 DIAGNOSIS — R1010 Upper abdominal pain, unspecified: Secondary | ICD-10-CM | POA: Diagnosis present

## 2014-04-25 DIAGNOSIS — I10 Essential (primary) hypertension: Secondary | ICD-10-CM | POA: Diagnosis not present

## 2014-04-25 DIAGNOSIS — E669 Obesity, unspecified: Secondary | ICD-10-CM | POA: Diagnosis not present

## 2014-04-25 DIAGNOSIS — R1084 Generalized abdominal pain: Secondary | ICD-10-CM | POA: Insufficient documentation

## 2014-04-25 DIAGNOSIS — Z8619 Personal history of other infectious and parasitic diseases: Secondary | ICD-10-CM | POA: Diagnosis not present

## 2014-04-25 DIAGNOSIS — Z3202 Encounter for pregnancy test, result negative: Secondary | ICD-10-CM | POA: Diagnosis not present

## 2014-04-25 DIAGNOSIS — Z8719 Personal history of other diseases of the digestive system: Secondary | ICD-10-CM | POA: Insufficient documentation

## 2014-04-25 LAB — URINALYSIS, ROUTINE W REFLEX MICROSCOPIC
BILIRUBIN URINE: NEGATIVE
Glucose, UA: NEGATIVE mg/dL
HGB URINE DIPSTICK: NEGATIVE
Ketones, ur: NEGATIVE mg/dL
Leukocytes, UA: NEGATIVE
Nitrite: NEGATIVE
Protein, ur: NEGATIVE mg/dL
SPECIFIC GRAVITY, URINE: 1.034 — AB (ref 1.005–1.030)
Urobilinogen, UA: 0.2 mg/dL (ref 0.0–1.0)
pH: 5.5 (ref 5.0–8.0)

## 2014-04-25 LAB — CBC WITH DIFFERENTIAL/PLATELET
Basophils Absolute: 0 10*3/uL (ref 0.0–0.1)
Basophils Relative: 0 % (ref 0–1)
EOS PCT: 5 % (ref 0–5)
Eosinophils Absolute: 0.2 10*3/uL (ref 0.0–0.7)
HEMATOCRIT: 37.2 % (ref 36.0–46.0)
Hemoglobin: 12.1 g/dL (ref 12.0–15.0)
LYMPHS ABS: 1 10*3/uL (ref 0.7–4.0)
Lymphocytes Relative: 34 % (ref 12–46)
MCH: 28.5 pg (ref 26.0–34.0)
MCHC: 32.5 g/dL (ref 30.0–36.0)
MCV: 87.5 fL (ref 78.0–100.0)
MONO ABS: 0.4 10*3/uL (ref 0.1–1.0)
Monocytes Relative: 15 % — ABNORMAL HIGH (ref 3–12)
NEUTROS ABS: 1.2 10*3/uL — AB (ref 1.7–7.7)
Neutrophils Relative %: 44 % (ref 43–77)
Platelets: 251 10*3/uL (ref 150–400)
RBC: 4.25 MIL/uL (ref 3.87–5.11)
RDW: 13.2 % (ref 11.5–15.5)
WBC: 2.8 10*3/uL — AB (ref 4.0–10.5)

## 2014-04-25 LAB — COMPREHENSIVE METABOLIC PANEL
ALT: 18 U/L (ref 0–35)
ANION GAP: 11 (ref 5–15)
AST: 26 U/L (ref 0–37)
Albumin: 3.5 g/dL (ref 3.5–5.2)
Alkaline Phosphatase: 121 U/L — ABNORMAL HIGH (ref 39–117)
BUN: 12 mg/dL (ref 6–23)
CHLORIDE: 104 meq/L (ref 96–112)
CO2: 23 meq/L (ref 19–32)
CREATININE: 0.73 mg/dL (ref 0.50–1.10)
Calcium: 9 mg/dL (ref 8.4–10.5)
GFR calc Af Amer: >90 mL/min (ref >90)
GFR calc non Af Amer: >90 mL/min (ref >90)
GLUCOSE: 122 mg/dL — AB (ref 70–99)
Potassium: 4.2 mEq/L (ref 3.7–5.3)
Sodium: 138 mEq/L (ref 137–147)
Total Protein: 8.2 g/dL (ref 6.0–8.3)

## 2014-04-25 LAB — POC URINE PREG, ED: Preg Test, Ur: NEGATIVE

## 2014-04-25 LAB — LIPASE, BLOOD: Lipase: 74 U/L — ABNORMAL HIGH (ref 11–59)

## 2014-04-25 MED ORDER — RANITIDINE HCL 150 MG PO TABS: 150.0000 mg | ORAL_TABLET | Freq: Two times a day (BID) | ORAL | Status: DC

## 2014-04-25 MED ORDER — HYDROMORPHONE HCL 1 MG/ML IJ SOLN
1.0000 mg | Freq: Once | INTRAMUSCULAR | Status: AC
  Administered 2014-04-25: 1 mg via INTRAVENOUS
  Filled 2014-04-25: qty 1

## 2014-04-25 MED ORDER — DICYCLOMINE HCL 20 MG PO TABS: 20.0000 mg | ORAL_TABLET | Freq: Two times a day (BID) | ORAL | Status: DC

## 2014-04-25 MED ORDER — OXYCODONE-ACETAMINOPHEN 5-325 MG PO TABS: 1.0000 | ORAL_TABLET | ORAL | Status: DC | PRN

## 2014-04-25 MED ORDER — ONDANSETRON HCL 4 MG/2ML IJ SOLN
4.0000 mg | Freq: Once | INTRAMUSCULAR | Status: AC
  Administered 2014-04-25: 4 mg via INTRAVENOUS
  Filled 2014-04-25: qty 2

## 2014-04-25 MED ORDER — KETOROLAC TROMETHAMINE 30 MG/ML IJ SOLN
30.0000 mg | Freq: Once | INTRAMUSCULAR | Status: AC
  Administered 2014-04-25: 30 mg via INTRAVENOUS
  Filled 2014-04-25: qty 1

## 2014-04-25 NOTE — ED Provider Notes (Signed)
CSN: [id number]     Arrival date & time 04/25/14  0745 History   First MD Initiated Contact with Patient 04/25/14 0759     Chief Complaint  Patient presents with  . Abdominal Pain     (Consider location/radiation/quality/duration/timing/severity/associated sxs/prior Treatment) HPI Comments: Patient presents to the ER for evaluation of abdominal pain. Patient reports that she has been having intermittent abdominal pain for approximately a month. Patient reports that she was seen in the ER for this and told she had a gallstone. Patient reports that the pain moves around into different areas. It is mostly in the center and upper part of her abdomen. Pain is sharp, stabbing and crampy in nature. No associated fever, vomiting, diarrhea or constipation.  Patient is a 28 y.o. female presenting with abdominal pain.  Abdominal Pain   Past Medical History  Diagnosis Date  . HSV (herpes simplex virus) infection   . HIV (human immunodeficiency virus infection)   . Hypertension   . Obesity    Past Surgical History  Procedure Laterality Date  . Incision and drainage abscess      on abdomen and buttocks   Family History  Problem Relation Age of Onset  . Hypertension Maternal Grandmother   . Diabetes Maternal Grandmother   . Heart disease Maternal Grandmother   . Cancer Maternal Grandmother   . Hypertension Paternal Grandmother   . Diabetes Paternal Grandmother   . Heart disease Paternal Grandmother   . Heart disease Mother    History  Substance Use Topics  . Smoking status: Never Smoker   . Smokeless tobacco: Never Used  . Alcohol Use: No   OB History    Gravida Para Term Preterm AB TAB SAB Ectopic Multiple Living   4 3 3  1 1    3      Review of Systems  Gastrointestinal: Positive for abdominal pain.  All other systems reviewed and are negative.     Allergies  Review of patient's allergies indicates no known allergies.  Home Medications   Prior to Admission medications    Medication Sig Start Date End Date Taking? Authorizing Provider  cephALEXin (KEFLEX) 500 MG capsule Take 1,000 mg by mouth 2 (two) times daily. x7 days   Yes Historical Provider, MD  HYDROcodone-acetaminophen (NORCO/VICODIN) 5-325 MG per tablet Take 1-2 tablets by mouth every 6 (six) hours as needed for moderate pain.   Yes Historical Provider, MD  sulfamethoxazole-trimethoprim (BACTRIM DS) 800-160 MG per tablet Take 1 tablet by mouth 3 (three) times a week. 04/23/14  Yes Ginnie Smart, MD  cephALEXin (KEFLEX) 500 MG capsule 2 caps po bid x 7 days 04/09/14   Donnita Falls Camprubi-Soms, PA-C  elvitegravir-cobicistat-emtricitabine-tenofovir (STRIBILD) 150-150-200-300 MG TABS tablet Take 1 tablet by mouth daily with breakfast. Patient not taking: Reported on 04/25/2014 04/23/14   Ginnie Smart, MD  ondansetron (ZOFRAN) 8 MG tablet Take 1 tablet (8 mg total) by mouth every 8 (eight) hours as needed for nausea or vomiting. 04/09/14   Mercedes Strupp Camprubi-Soms, PA-C   BP 117/76 mmHg  Pulse 56  Temp(Src) 98 F (36.7 C) (Oral)  Resp 16  SpO2 98%  LMP 06/25/2008 Physical Exam  Constitutional: She is oriented to person, place, and time. She appears well-developed and well-nourished. No distress.  HENT:  Head: Normocephalic and atraumatic.  Right Ear: Hearing normal.  Left Ear: Hearing normal.  Nose: Nose normal.  Mouth/Throat: Oropharynx is clear and moist and mucous membranes are normal.  Eyes: Conjunctivae and  EOM are normal. Pupils are equal, round, and reactive to light.  Neck: Normal range of motion. Neck supple.  Cardiovascular: Regular rhythm, S1 normal and S2 normal.  Exam reveals no gallop and no friction rub.   No murmur heard. Pulmonary/Chest: Effort normal and breath sounds normal. No respiratory distress. She exhibits no tenderness.  Abdominal: Soft. Normal appearance and bowel sounds are normal. There is no hepatosplenomegaly. There is generalized tenderness. There is  no rebound, no guarding, no tenderness at McBurney's point and negative Murphy's sign. No hernia.  Musculoskeletal: Normal range of motion.  Neurological: She is alert and oriented to person, place, and time. She has normal strength. No cranial nerve deficit or sensory deficit. Coordination normal. GCS eye subscore is 4. GCS verbal subscore is 5. GCS motor subscore is 6.  Skin: Skin is warm, dry and intact. No rash noted. No cyanosis.  Psychiatric: She has a normal mood and affect. Her speech is normal and behavior is normal. Thought content normal.    ED Course  Procedures (including critical care time) Labs Review Labs Reviewed  CBC WITH DIFFERENTIAL - Abnormal; Notable for the following:    WBC 2.8 (*)    Neutro Abs 1.2 (*)    Monocytes Relative 15 (*)    All other components within normal limits  COMPREHENSIVE METABOLIC PANEL - Abnormal; Notable for the following:    Glucose, Bld 122 (*)    Alkaline Phosphatase 121 (*)    Total Bilirubin <0.2 (*)    All other components within normal limits  LIPASE, BLOOD - Abnormal; Notable for the following:    Lipase 74 (*)    All other components within normal limits  URINALYSIS, ROUTINE W REFLEX MICROSCOPIC - Abnormal; Notable for the following:    APPearance CLOUDY (*)    Specific Gravity, Urine 1.034 (*)    All other components within normal limits  POC URINE PREG, ED    Imaging Review [place] Abdomen Complete  04/25/2014   CLINICAL DATA:  Upper abdominal pain.  EXAM: ULTRASOUND ABDOMEN COMPLETE  COMPARISON:  CT of the abdomen on 04/09/2014.  FINDINGS: Gallbladder: A few small mobile gallstones are identified in the gallbladder. No associated gallbladder wall thickening, sonographic Murphy sign or gallbladder distension.  Common bile duct: Diameter: Normal caliber of 4 mm.  Liver: No focal lesion identified. Within normal limits in parenchymal echogenicity. No hepatic mass or intrahepatic biliary ductal dilatation identified. The portal vein is  open.  IVC: No abnormality visualized.  Pancreas: Visualized portion unremarkable.  Spleen: Size and appearance within normal limits.  Right Kidney: Length: 11.5 cm. Echogenicity within normal limits. No mass or hydronephrosis visualized.  Left Kidney: Length: 11.3 cm. Echogenicity within normal limits. No mass or hydronephrosis visualized.  Abdominal aorta: No aneurysm visualized.  Other findings: None.  IMPRESSION: Cholelithiasis with small gallstones. No evidence of cholecystitis or biliary obstruction.   Electronically Signed   By: Irish LackGlenn  Yamagata M.D.   On: 04/25/2014 13:50     EKG Interpretation None      MDM   Final diagnoses:  Upper abdominal pain  Elevated lipase    Patient presented to the ER for evaluation of abdominal pain. Patient has atypical pain, and that it is moving around her abdomen to different areas over the past month. She was told that she had a gallstone when she was seen approximately a month ago and had a CAT scan. Patient did not have a Murphy sign, tenderness is epigastric and left upper quadrant  more than anywhere else. Patient's labs were normal including LFTs, except for slightly elevated lipase of 80. This is nonspecific. Ultrasound was performed to evaluate for cholecystitis, no acute cholecystitis has been identified.  Patient administered pain medication here in the ER during her workup. She will be discharged with analgesia, follow up with general surgery. Return if symptoms worsen.    Gilda Creasehristopher J. Westyn Keatley, MD 04/25/14 [phone]

## 2014-04-25 NOTE — ED Notes (Signed)
Pt states she has been having gen abd pain x 1 month but "knows what's wrong".  States she was seen here previously and told that she has gallstones.  Denies vomiting or diarrhea.

## 2014-04-25 NOTE — Discharge Instructions (Signed)
Abdominal Pain Many things can cause abdominal pain. Usually, abdominal pain is not caused by a disease and will improve without treatment. It can often be observed and treated at home. Your health care provider will do a physical exam and possibly order blood tests and X-rays to help determine the seriousness of your pain. However, in many cases, more time must pass before a clear cause of the pain can be found. Before that point, your health care provider may not know if you need more testing or further treatment. HOME CARE INSTRUCTIONS  Monitor your abdominal pain for any changes. The following actions may help to alleviate any discomfort you are experiencing:  Only take over-the-counter or prescription medicines as directed by your health care provider.  Do not take laxatives unless directed to do so by your health care provider.  Try a clear liquid diet (broth, tea, or water) as directed by your health care provider. Slowly move to a bland diet as tolerated. SEEK MEDICAL CARE IF:  You have unexplained abdominal pain.  You have abdominal pain associated with nausea or diarrhea.  You have pain when you urinate or have a bowel movement.  You experience abdominal pain that wakes you in the night.  You have abdominal pain that is worsened or improved by eating food.  You have abdominal pain that is worsened with eating fatty foods.  You have a fever. SEEK IMMEDIATE MEDICAL CARE IF:   Your pain does not go away within 2 hours.  You keep throwing up (vomiting).  Your pain is felt only in portions of the abdomen, such as the right side or the left lower portion of the abdomen.  You pass bloody or black tarry stools. MAKE SURE YOU:  Understand these instructions.   Will watch your condition.   Will get help right away if you are not doing well or get worse.  Document Released: 03/21/2005 Document Revised: 06/16/2013 Document Reviewed: 02/18/2013 [facility] Patient Information  2015 [place], [place]. This information is not intended to replace advice given to you by your health care provider. Make sure you discuss any questions you have with your health care provider. Acute Pancreatitis Acute pancreatitis is a disease in which the pancreas becomes suddenly inflamed. The pancreas is a large gland located behind your stomach. The pancreas produces enzymes that help digest food. The pancreas also releases the hormones glucagon and insulin that help regulate blood sugar. Damage to the pancreas occurs when the digestive enzymes from the pancreas are activated and begin attacking the pancreas before being released into the intestine. Most acute attacks last a couple of days and can cause serious complications. Some people become dehydrated and develop low blood pressure. In severe cases, bleeding into the pancreas can lead to shock and can be life-threatening. The lungs, heart, and kidneys may fail. CAUSES  Pancreatitis can happen to anyone. In some cases, the cause is unknown. Most cases are caused by:  Alcohol abuse.  Gallstones. Other less common causes are:  Certain medicines.  Exposure to certain chemicals.  Infection.  Damage caused by an accident (trauma).  Abdominal surgery. SYMPTOMS   Pain in the upper abdomen that may radiate to the back.  Tenderness and swelling of the abdomen.  Nausea and vomiting. DIAGNOSIS  Your caregiver will perform a physical exam. Blood and stool tests may be done to confirm the diagnosis. Imaging tests may also be done, such as X-rays, CT scans, or an ultrasound of the abdomen. TREATMENT  Treatment usually  requires a stay in the hospital. Treatment may include:  Pain medicine.  Fluid replacement through an intravenous line (IV).  Placing a tube in the stomach to remove stomach contents and control vomiting.  Not eating for 3 or 4 days. This gives your pancreas a rest, because enzymes are not being produced that can cause  further damage.  Antibiotic medicines if your condition is caused by an infection.  Surgery of the pancreas or gallbladder. HOME CARE INSTRUCTIONS   Follow the diet advised by your caregiver. This may involve avoiding alcohol and decreasing the amount of fat in your diet.  Eat smaller, more frequent meals. This reduces the amount of digestive juices the pancreas produces.  Drink enough fluids to keep your urine clear or pale yellow.  Only take over-the-counter or prescription medicines as directed by your caregiver.  Avoid drinking alcohol if it caused your condition.  Do not smoke.  Get plenty of rest.  Check your blood sugar at home as directed by your caregiver.  Keep all follow-up appointments as directed by your caregiver. SEEK MEDICAL CARE IF:   You do not recover as quickly as expected.  You develop new or worsening symptoms.  You have persistent pain, weakness, or nausea.  You recover and then have another episode of pain. SEEK IMMEDIATE MEDICAL CARE IF:   You are unable to eat or keep fluids down.  Your pain becomes severe.  You have a fever or persistent symptoms for more than 2 to 3 days.  You have a fever and your symptoms suddenly get worse.  Your skin or the white part of your eyes turn yellow (jaundice).  You develop vomiting.  You feel dizzy, or you faint.  Your blood sugar is high (over 300 mg/dL). MAKE SURE YOU:   Understand these instructions.  Will watch your condition.  Will get help right away if you are not doing well or get worse. Document Released: 06/11/2005 Document Revised: 12/11/2011 Document Reviewed: 09/20/2011 [facility] Patient Information 2015 [place]. This information is not intended to replace advice given to you by your health care provider. Make sure you discuss any questions you have with your health care provider. Cholelithiasis Cholelithiasis (also called gallstones) is a form of gallbladder disease in which  gallstones form in your gallbladder. The gallbladder is an organ that stores bile made in the liver, which helps digest fats. Gallstones begin as small crystals and slowly grow into stones. Gallstone pain occurs when the gallbladder spasms and a gallstone is blocking the duct. Pain can also occur when a stone passes out of the duct.  RISK FACTORS  Being female.   Having multiple pregnancies. Health care providers sometimes advise removing diseased gallbladders before future pregnancies.   Being obese.  Eating a diet heavy in fried foods and fat.   Being older than 60 years and increasing age.   Prolonged use of medicines containing female hormones.   Having diabetes mellitus.   Rapidly losing weight.   Having a family history of gallstones (heredity).  SYMPTOMS  Nausea.   Vomiting.  Abdominal pain.   Yellowing of the skin (jaundice).   Sudden pain. It may persist from several minutes to several hours.  Fever.   Tenderness to the touch. In some cases, when gallstones do not move into the bile duct, people have no pain or symptoms. These are called "silent" gallstones.  TREATMENT Silent gallstones do not need treatment. In severe cases, emergency surgery may be required. Options for treatment  include:  Surgery to remove the gallbladder. This is the most common treatment.  Medicines. These do not always work and may take 6-12 months or more to work.  Shock wave treatment (extracorporeal biliary lithotripsy). In this treatment an ultrasound machine sends shock waves to the gallbladder to break gallstones into smaller pieces that can pass into the intestines or be dissolved by medicine. HOME CARE INSTRUCTIONS   Only take over-the-counter or prescription medicines for pain, discomfort, or fever as directed by your health care provider.   Follow a low-fat diet until seen again by your health care provider. Fat causes the gallbladder to contract, which can result  in pain.   Follow up with your health care provider as directed. Attacks are almost always recurrent and surgery is usually required for permanent treatment.  SEEK IMMEDIATE MEDICAL CARE IF:   Your pain increases and is not controlled by medicines.   You have a fever or persistent symptoms for more than 2-3 days.   You have a fever and your symptoms suddenly get worse.   You have persistent nausea and vomiting.  MAKE SURE YOU:   Understand these instructions.  Will watch your condition.  Will get help right away if you are not doing well or get worse. Document Released: 06/07/2005 Document Revised: 02/11/2013 Document Reviewed: 12/03/2012 [facility] Patient Information 2015 [place]. This information is not intended to replace advice given to you by your health care provider. Make sure you discuss any questions you have with your health care provider.

## 2014-04-25 NOTE — ED Notes (Signed)
Pt ambulatory to restroom to attempt to give urine sample   

## 2014-04-25 NOTE — ED Notes (Signed)
Pt states she is still unable to give urine sample at this time  

## 2014-04-26 ENCOUNTER — Encounter (HOSPITAL_COMMUNITY): Payer: Self-pay | Admitting: Emergency Medicine

## 2014-05-10 ENCOUNTER — Other Ambulatory Visit: Payer: Medicaid Other

## 2014-05-10 DIAGNOSIS — B20 Human immunodeficiency virus [HIV] disease: Secondary | ICD-10-CM

## 2014-05-11 LAB — T-HELPER CELL (CD4) - (RCID CLINIC ONLY)
CD4 T CELL HELPER: 20 % — AB (ref 33–55)
CD4 T Cell Abs: 190 /uL — ABNORMAL LOW (ref 400–2700)

## 2014-05-11 LAB — HIV-1 RNA QUANT-NO REFLEX-BLD
HIV 1 RNA Quant: 31681 copies/mL — ABNORMAL HIGH (ref <20)
HIV-1 RNA Quant, Log: 4.5 {Log} — ABNORMAL HIGH (ref <1.30)

## 2014-05-17 ENCOUNTER — Ambulatory Visit (INDEPENDENT_AMBULATORY_CARE_PROVIDER_SITE_OTHER): Payer: Medicaid Other | Admitting: Infectious Diseases

## 2014-05-17 ENCOUNTER — Encounter: Payer: Self-pay | Admitting: Infectious Diseases

## 2014-05-17 VITALS — BP 137/83 | HR 75 | Temp 97.8°F | Wt 287.0 lb

## 2014-05-17 DIAGNOSIS — B20 Human immunodeficiency virus [HIV] disease: Secondary | ICD-10-CM

## 2014-05-17 DIAGNOSIS — R87613 High grade squamous intraepithelial lesion on cytologic smear of cervix (HGSIL): Secondary | ICD-10-CM

## 2014-05-17 NOTE — Progress Notes (Signed)
   Subjective:    Patient ID: Brenda Tyler, female    DOB: 09/19/1985, 28 y.o.   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 She was started on bactrim and stribild at her previous visit. Is taking bactrim only.  Has been feeling well.   Review of Systems     Objective:   Physical Exam  Constitutional: She appears well-developed and well-nourished.  HENT:  Mouth/Throat: No oropharyngeal exudate.  Eyes: EOM are normal. Pupils are equal, round, and reactive to light.  Neck: Neck supple.  Cardiovascular: Normal rate, regular rhythm and normal heart sounds.   Pulmonary/Chest: Effort normal and breath sounds normal.  Abdominal: Soft. Bowel sounds are normal. There is no tenderness. There is no rebound.  Lymphadenopathy:    She has no cervical adenopathy.          Assessment & Plan:

## 2014-05-17 NOTE — Assessment & Plan Note (Signed)
Will get back into GYN

## 2014-05-17 NOTE — Assessment & Plan Note (Signed)
Will get her insurance issues clarified.  Will get her on stribild.  Will see her back in 6 weeks.

## 2014-05-22 ENCOUNTER — Emergency Department (HOSPITAL_COMMUNITY)
Admission: EM | Admit: 2014-05-22 | Discharge: 2014-05-22 | Disposition: A | Discharge status: Home / Self Care | Payer: Medicaid Other | Attending: Emergency Medicine | Admitting: Emergency Medicine

## 2014-05-22 ENCOUNTER — Encounter (HOSPITAL_COMMUNITY): Payer: Self-pay | Admitting: *Deleted

## 2014-05-22 DIAGNOSIS — Z8619 Personal history of other infectious and parasitic diseases: Secondary | ICD-10-CM | POA: Diagnosis not present

## 2014-05-22 DIAGNOSIS — E669 Obesity, unspecified: Secondary | ICD-10-CM | POA: Insufficient documentation

## 2014-05-22 DIAGNOSIS — N898 Other specified noninflammatory disorders of vagina: Secondary | ICD-10-CM | POA: Diagnosis present

## 2014-05-22 DIAGNOSIS — Z21 Asymptomatic human immunodeficiency virus [HIV] infection status: Secondary | ICD-10-CM | POA: Diagnosis not present

## 2014-05-22 DIAGNOSIS — N760 Acute vaginitis: Secondary | ICD-10-CM | POA: Insufficient documentation

## 2014-05-22 DIAGNOSIS — I10 Essential (primary) hypertension: Secondary | ICD-10-CM | POA: Insufficient documentation

## 2014-05-22 DIAGNOSIS — Z79899 Other long term (current) drug therapy: Secondary | ICD-10-CM | POA: Diagnosis not present

## 2014-05-22 DIAGNOSIS — Z3202 Encounter for pregnancy test, result negative: Secondary | ICD-10-CM | POA: Insufficient documentation

## 2014-05-22 LAB — URINE MICROSCOPIC-ADD ON

## 2014-05-22 LAB — URINALYSIS, ROUTINE W REFLEX MICROSCOPIC
Bilirubin Urine: NEGATIVE
Glucose, UA: NEGATIVE mg/dL
Hgb urine dipstick: NEGATIVE
Ketones, ur: NEGATIVE mg/dL
Nitrite: NEGATIVE
PH: 6 (ref 5.0–8.0)
PROTEIN: NEGATIVE mg/dL
Specific Gravity, Urine: 1.019 (ref 1.005–1.030)
Urobilinogen, UA: 1 mg/dL (ref 0.0–1.0)

## 2014-05-22 LAB — WET PREP, GENITAL
TRICH WET PREP: NONE SEEN
YEAST WET PREP: NONE SEEN

## 2014-05-22 LAB — POC URINE PREG, ED: Preg Test, Ur: NEGATIVE

## 2014-05-22 MED ORDER — OXYCODONE-ACETAMINOPHEN 5-325 MG PO TABS
1.0000 | ORAL_TABLET | ORAL | Status: DC | PRN
Start: 2014-05-22 — End: 2014-06-30

## 2014-05-22 MED ORDER — FLUCONAZOLE 150 MG PO TABS: 150.0000 mg | ORAL_TABLET | Freq: Every day | ORAL | Status: AC

## 2014-05-22 MED ORDER — OXYCODONE-ACETAMINOPHEN 5-325 MG PO TABS
1.0000 | ORAL_TABLET | Freq: Once | ORAL | Status: AC
  Administered 2014-05-22: 1 via ORAL
  Filled 2014-05-22: qty 1

## 2014-05-22 NOTE — ED Notes (Signed)
The pt is c/o burning in her vaginal area is burning after using monostat cream for a yeast infection last pm  lmp none

## 2014-05-22 NOTE — ED Notes (Signed)
Pt A&OX4, ambulatory at d/c with steady gait, NAD 

## 2014-05-22 NOTE — ED Provider Notes (Signed)
CSN: [id number]     Arrival date & time 05/22/14  0302 History   First MD Initiated Contact with Patient 05/22/14 [phone]     Chief Complaint  Patient presents with  . Vaginal Itching     (Consider location/radiation/quality/duration/timing/severity/associated sxs/prior Treatment) Patient is a 28 y.o. female presenting with vaginal itching. The history is provided by the patient. No language interpreter was used.  Vaginal Itching This is a new problem. The current episode started yesterday. Pertinent negatives include no chills or fever. Associated symptoms comments: Vaginal pain and itching, like previous vaginal yeast infections, unresponsive to Monistat. No fever. She reports vaginal discharge as well. No urinary symptoms except vulvar burning with urination. No fever, N, V, abdominal pain..    Past Medical History  Diagnosis Date  . HSV (herpes simplex virus) infection   . HIV (human immunodeficiency virus infection)   . Hypertension   . Obesity    Past Surgical History  Procedure Laterality Date  . Incision and drainage abscess      on abdomen and buttocks   Family History  Problem Relation Age of Onset  . Hypertension Maternal Grandmother   . Diabetes Maternal Grandmother   . Heart disease Maternal Grandmother   . Cancer Maternal Grandmother   . Hypertension Paternal Grandmother   . Diabetes Paternal Grandmother   . Heart disease Paternal Grandmother   . Heart disease Mother    History  Substance Use Topics  . Smoking status: Never Smoker   . Smokeless tobacco: Never Used  . Alcohol Use: No   OB History    Gravida Para Term Preterm AB TAB SAB Ectopic Multiple Living   4 3 3  1 1    3      Review of Systems  Constitutional: Negative for fever and chills.  Gastrointestinal: Negative.   Genitourinary: Positive for vaginal discharge and vaginal pain. Negative for dysuria, flank pain and menstrual problem.  Musculoskeletal: Negative.   Skin: Negative.    Neurological: Negative.       Allergies  Review of patient's allergies indicates no known allergies.  Home Medications   Prior to Admission medications   Medication Sig Start Date End Date Taking? Authorizing Provider  oxyCODONE-acetaminophen (PERCOCET) 5-325 MG per tablet Take 1-2 tablets by mouth every 4 (four) hours as needed. Patient taking differently: Take 1-2 tablets by mouth every 4 (four) hours as needed for moderate pain.  04/25/14  Yes Gilda Creasehristopher J. Pollina, MD  sulfamethoxazole-trimethoprim (BACTRIM DS) 800-160 MG per tablet Take 1 tablet by mouth 3 (three) times a week. 04/23/14  Yes Ginnie SmartJeffrey C Hatcher, MD  elvitegravir-cobicistat-emtricitabine-tenofovir (STRIBILD) 150-150-200-300 MG TABS tablet Take 1 tablet by mouth daily with breakfast. Patient not taking: Reported on 05/17/2014 04/23/14   Ginnie SmartJeffrey C Hatcher, MD  HYDROcodone-acetaminophen (NORCO/VICODIN) 5-325 MG per tablet Take 1-2 tablets by mouth every 6 (six) hours as needed for moderate pain.    Historical Provider, MD  ranitidine (ZANTAC) 150 MG tablet Take 1 tablet (150 mg total) by mouth 2 (two) times daily. Patient not taking: Reported on 05/17/2014 04/25/14   Gilda Creasehristopher J. Pollina, MD   BP 137/92 mmHg  Pulse 99  Temp(Src) 97.7 F (36.5 C) (Oral)  Resp 20  Ht 5\' 4"  (1.626 m)  Wt 280 lb (127.007 kg)  BMI 48.04 kg/m2  SpO2 100%  LMP 06/25/2008 Physical Exam  Constitutional: She is oriented to person, place, and time. She appears well-developed and well-nourished.  Neck: Normal range of motion.  Pulmonary/Chest: Effort normal.  Abdominal: Soft. There is no tenderness. There is no rebound and no guarding.  Genitourinary:  White, thick vaginal discharge. Vulvar redness and irritation that is tender. No ulcerations. No specific CMT or adnexal mass or tenderness.   Neurological: She is alert and oriented to person, place, and time.  Skin: Skin is warm and dry.    ED Course  Procedures (including critical  care time) Labs Review Labs Reviewed  WET PREP, GENITAL - Abnormal; Notable for the following:    Clue Cells Wet Prep HPF POC FEW (*)    WBC, Wet Prep HPF POC MODERATE (*)    All other components within normal limits  URINE CULTURE  GC/CHLAMYDIA PROBE AMP  URINALYSIS, ROUTINE W REFLEX MICROSCOPIC  POC URINE PREG, ED    Imaging Review No results found.   EKG Interpretation None      MDM   Final diagnoses:  None    1. Vaginitis  No specific lab findings to suggest STD infection. Will treat with Diflucan x 5 days given stated history of yeast infection difficult to resolve. Encourage follow up with PCP.    Arnoldo HookerShari A Odin Mariani, PA-C 05/26/14 1542  Purvis SheffieldForrest Harrison, MD 05/28/14 [phone]

## 2014-05-22 NOTE — Discharge Instructions (Signed)
TAKE THE MEDICATIONS AS PRESCRIBED AND FOLLOW UP WITH YOUR DOCTOR FOR RECHECK IN 2-3 DAYS.

## 2014-05-23 LAB — URINE CULTURE
Colony Count: 8000
Special Requests: NORMAL

## 2014-05-25 LAB — GC/CHLAMYDIA PROBE AMP
CT PROBE, AMP APTIMA: NEGATIVE
GC Probe RNA: NEGATIVE

## 2014-06-09 ENCOUNTER — Encounter (HOSPITAL_COMMUNITY): Payer: Self-pay | Admitting: Emergency Medicine

## 2014-06-09 ENCOUNTER — Emergency Department (HOSPITAL_COMMUNITY): Payer: Medicaid Other

## 2014-06-09 ENCOUNTER — Emergency Department (HOSPITAL_COMMUNITY)
Admission: EM | Admit: 2014-06-09 | Discharge: 2014-06-10 | Disposition: A | Discharge status: Home / Self Care | Payer: Medicaid Other | Attending: Emergency Medicine | Admitting: Emergency Medicine

## 2014-06-09 DIAGNOSIS — B20 Human immunodeficiency virus [HIV] disease: Secondary | ICD-10-CM | POA: Insufficient documentation

## 2014-06-09 DIAGNOSIS — R1110 Vomiting, unspecified: Secondary | ICD-10-CM | POA: Diagnosis not present

## 2014-06-09 DIAGNOSIS — R059 Cough, unspecified: Secondary | ICD-10-CM

## 2014-06-09 DIAGNOSIS — Z79899 Other long term (current) drug therapy: Secondary | ICD-10-CM | POA: Diagnosis not present

## 2014-06-09 DIAGNOSIS — E669 Obesity, unspecified: Secondary | ICD-10-CM | POA: Insufficient documentation

## 2014-06-09 DIAGNOSIS — J069 Acute upper respiratory infection, unspecified: Secondary | ICD-10-CM | POA: Diagnosis not present

## 2014-06-09 DIAGNOSIS — R079 Chest pain, unspecified: Secondary | ICD-10-CM | POA: Diagnosis not present

## 2014-06-09 DIAGNOSIS — B9789 Other viral agents as the cause of diseases classified elsewhere: Secondary | ICD-10-CM

## 2014-06-09 DIAGNOSIS — R05 Cough: Secondary | ICD-10-CM

## 2014-06-09 DIAGNOSIS — I10 Essential (primary) hypertension: Secondary | ICD-10-CM | POA: Diagnosis not present

## 2014-06-09 LAB — CBC WITH DIFFERENTIAL/PLATELET
Basophils Absolute: 0 10*3/uL (ref 0.0–0.1)
Basophils Relative: 0 % (ref 0–1)
EOS ABS: 0.3 10*3/uL (ref 0.0–0.7)
EOS PCT: 6 % — AB (ref 0–5)
HCT: 37.3 % (ref 36.0–46.0)
HEMOGLOBIN: 12.2 g/dL (ref 12.0–15.0)
LYMPHS ABS: 1.1 10*3/uL (ref 0.7–4.0)
LYMPHS PCT: 21 % (ref 12–46)
MCH: 28.6 pg (ref 26.0–34.0)
MCHC: 32.7 g/dL (ref 30.0–36.0)
MCV: 87.4 fL (ref 78.0–100.0)
MONOS PCT: 15 % — AB (ref 3–12)
Monocytes Absolute: 0.8 10*3/uL (ref 0.1–1.0)
Neutro Abs: 3 10*3/uL (ref 1.7–7.7)
Neutrophils Relative %: 58 % (ref 43–77)
Platelets: 249 10*3/uL (ref 150–400)
RBC: 4.27 MIL/uL (ref 3.87–5.11)
RDW: 13.3 % (ref 11.5–15.5)
WBC: 5.3 10*3/uL (ref 4.0–10.5)

## 2014-06-09 LAB — BASIC METABOLIC PANEL
Anion gap: 11 (ref 5–15)
BUN: 10 mg/dL (ref 6–23)
CO2: 26 meq/L (ref 19–32)
Calcium: 9.5 mg/dL (ref 8.4–10.5)
Chloride: 105 mEq/L (ref 96–112)
Creatinine, Ser: 0.81 mg/dL (ref 0.50–1.10)
GFR calc Af Amer: >90 mL/min (ref >90)
GLUCOSE: 96 mg/dL (ref 70–99)
Potassium: 3.8 mEq/L (ref 3.7–5.3)
SODIUM: 142 meq/L (ref 137–147)

## 2014-06-09 LAB — I-STAT TROPONIN, ED: TROPONIN I, POC: 0 ng/mL (ref 0.00–0.08)

## 2014-06-09 MED ORDER — IPRATROPIUM-ALBUTEROL 0.5-2.5 (3) MG/3ML IN SOLN
3.0000 mL | RESPIRATORY_TRACT | Status: DC
  Administered 2014-06-09: 3 mL via RESPIRATORY_TRACT
  Filled 2014-06-09: qty 3

## 2014-06-09 NOTE — ED Provider Notes (Signed)
CSN: [id number]     Arrival date & time 06/09/14  2131 History   First MD Initiated Contact with Patient 06/09/14 2159     Chief Complaint  Patient presents with  . Cough   HPI  Patient is a 28 year old female with past medical history of HIV with most recent CD4 count of 190 who presents to the emergency room for evaluation of cough. Patient states that for the past week she has had a dry hacking cough associated with congestion, rhinorrhea, sore throat, chest pain while coughing, abdominal pain, and fatigue. Patient states that all of her children had similar symptoms this week. Patient states that she has not had any fevers. She has been taking NyQuil and off brand sinus medications at home with little relief. Nothing makes her feel worse. Patient is not currently taking any HIV therapy at this time. She is starting new medication. She sees Dr. Tinnie GensJeffrey for her infectious disease.  Past Medical History  Diagnosis Date  . HSV (herpes simplex virus) infection   . HIV (human immunodeficiency virus infection)   . Hypertension   . Obesity    Past Surgical History  Procedure Laterality Date  . Incision and drainage abscess      on abdomen and buttocks   Family History  Problem Relation Age of Onset  . Hypertension Maternal Grandmother   . Diabetes Maternal Grandmother   . Heart disease Maternal Grandmother   . Cancer Maternal Grandmother   . Hypertension Paternal Grandmother   . Diabetes Paternal Grandmother   . Heart disease Paternal Grandmother   . Heart disease Mother    History  Substance Use Topics  . Smoking status: Never Smoker   . Smokeless tobacco: Never Used  . Alcohol Use: 1.0 oz/week    2 Not specified per week     Comment: occasional   OB History    Gravida Para Term Preterm AB TAB SAB Ectopic Multiple Living   4 3 3  1 1    3      Review of Systems  Constitutional: Positive for chills and fatigue. Negative for fever.  Respiratory: Positive for cough, chest  tightness, shortness of breath and wheezing.   Cardiovascular: Positive for chest pain. Negative for palpitations.  Gastrointestinal: Positive for vomiting (Posttussive). Negative for nausea, abdominal pain, diarrhea, constipation, blood in stool and anal bleeding.  Genitourinary: Negative for dysuria, urgency, frequency, hematuria and difficulty urinating.  All other systems reviewed and are negative.     Allergies  Review of patient's allergies indicates no known allergies.  Home Medications   Prior to Admission medications   Medication Sig Start Date End Date Taking? Authorizing Provider  sulfamethoxazole-trimethoprim (BACTRIM DS) 800-160 MG per tablet Take 1 tablet by mouth 3 (three) times a week. 04/23/14  Yes Ginnie SmartJeffrey C Hatcher, MD  elvitegravir-cobicistat-emtricitabine-tenofovir (STRIBILD) 150-150-200-300 MG TABS tablet Take 1 tablet by mouth daily with breakfast. 04/23/14   Ginnie SmartJeffrey C Hatcher, MD  oxyCODONE-acetaminophen (PERCOCET/ROXICET) 5-325 MG per tablet Take 1 tablet by mouth every 4 (four) hours as needed for severe pain. Patient not taking: Reported on 06/09/2014 05/22/14   Melvenia BeamShari A Upstill, PA-C  ranitidine (ZANTAC) 150 MG tablet Take 1 tablet (150 mg total) by mouth 2 (two) times daily. Patient not taking: Reported on 05/17/2014 04/25/14   Gilda Creasehristopher J. Pollina, MD   BP 123/86 mmHg  Pulse 100  Temp(Src) 98.1 F (36.7 C) (Oral)  Resp 18  Ht 5\' 4"  (1.626 m)  Wt 280 lb (127.007 kg)  BMI 48.04 kg/m2  SpO2 94%  LMP 06/25/2008 Physical Exam  Constitutional: She is oriented to person, place, and time. She appears well-developed and well-nourished. No distress.  HENT:  Head: Normocephalic and atraumatic.  Mouth/Throat: Oropharynx is clear and moist. No oropharyngeal exudate.  Eyes: Conjunctivae and EOM are normal. Pupils are equal, round, and reactive to light. No scleral icterus.  Neck: Normal range of motion. Neck supple. No JVD present. No thyromegaly present.   Cardiovascular: Normal rate, regular rhythm, normal heart sounds and intact distal pulses.  Exam reveals no gallop and no friction rub.   No murmur heard. Pulmonary/Chest: Effort normal and breath sounds normal. No respiratory distress. She has no wheezes. She has no rales. She exhibits tenderness.  Decreased breath sounds in the RLL  Abdominal: Soft. Bowel sounds are normal. She exhibits no distension and no mass. There is no tenderness. There is no rebound and no guarding.  Musculoskeletal: Normal range of motion.  Lymphadenopathy:    She has no cervical adenopathy.  Neurological: She is alert and oriented to person, place, and time. She has normal strength. No cranial nerve deficit or sensory deficit. Coordination normal.  Skin: Skin is warm and dry. She is not diaphoretic.  Psychiatric: She has a normal mood and affect. Her behavior is normal. Judgment and thought content normal.  Nursing note and vitals reviewed.   ED Course  Procedures (including critical care time) Labs Review Labs Reviewed  CBC WITH DIFFERENTIAL - Abnormal; Notable for the following:    Monocytes Relative 15 (*)    Eosinophils Relative 6 (*)    All other components within normal limits  BASIC METABOLIC PANEL  I-STAT TROPOININ, ED    Imaging Review Dg Chest 2 View  06/09/2014   CLINICAL DATA:  Cough for 1 week  EXAM: CHEST  2 VIEW  COMPARISON:  12/03/2011  FINDINGS: Cardiomediastinal silhouette is stable. No acute infiltrate or pleural effusion. No pulmonary edema. Bony thorax is unremarkable.  IMPRESSION: No active cardiopulmonary disease.   Electronically Signed   By: Natasha MeadLiviu  Pop M.D.   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 Physical exam reveals chest tenderness and clear lungs auscultation. Vital signs are stable. CBC is unremarkable. BMP is  unremarkable. I-STAT troponin is negative. Chest x-ray reveals no acute infiltrates at this time. Suspect this is likely a viral upper respiratory infection versus bronchitis. She was able to ambulate around the emergency room with an average pulse ox of 94%. I believe that patient is stable for discharge home. We'll treat symptomatically with tussionex, ibuprofen, cetirizine, and nasal saline.  Patient follow-up with her PCP that she has established with in her infectious disease doctor. She is to return for intractable fevers, worsening shortness of breath, or any other concerning symptoms. She states understanding and agreement at this time. Patient is stable for discharge. Patient was seen by and discussed with Dr. Denton LankSteinl who agrees with the above workup and plan.   Eben Burowourtney A Forcucci, PA-C 06/10/14 [phone]  Suzi RootsKevin E Steinl, MD 06/11/14 1520

## 2014-06-09 NOTE — ED Notes (Signed)
Pt presents with cough x 1 week, pt states she has tried OTC meds with no relief. Pt reports emesis x 2 in last 24 hours. Upper chest heaviness with cough and deep inspiration.

## 2014-06-10 MED ORDER — CETIRIZINE HCL 10 MG PO CAPS: 10.0000 mg | ORAL_CAPSULE | Freq: Every evening | ORAL | Status: DC | PRN

## 2014-06-10 MED ORDER — IBUPROFEN 200 MG PO TABS
400.0000 mg | ORAL_TABLET | Freq: Once | ORAL | Status: AC
  Administered 2014-06-10: 400 mg via ORAL
  Filled 2014-06-10: qty 2

## 2014-06-10 MED ORDER — HYDROCODONE-ACETAMINOPHEN 5-325 MG PO TABS
1.0000 | ORAL_TABLET | Freq: Once | ORAL | Status: AC
  Administered 2014-06-10: 1 via ORAL
  Filled 2014-06-10: qty 1

## 2014-06-10 MED ORDER — IBUPROFEN 800 MG PO TABS: 800.0000 mg | ORAL_TABLET | Freq: Three times a day (TID) | ORAL | Status: DC

## 2014-06-10 MED ORDER — HYDROCOD POLST-CHLORPHEN POLST 10-8 MG/5ML PO LQCR: 5.0000 mL | Freq: Two times a day (BID) | ORAL | Status: DC | PRN

## 2014-06-10 MED ORDER — ALBUTEROL SULFATE HFA 108 (90 BASE) MCG/ACT IN AERS: 1.0000 | INHALATION_SPRAY | Freq: Four times a day (QID) | RESPIRATORY_TRACT | Status: DC | PRN

## 2014-06-10 NOTE — Discharge Instructions (Signed)
Upper Respiratory Infection, Adult °An upper respiratory infection (URI) is also sometimes known as the common cold. The upper respiratory tract includes the nose, sinuses, throat, trachea, and bronchi. Bronchi are the airways leading to the lungs. Most people improve within 1 week, but symptoms can last up to 2 weeks. A residual cough may last even longer.  °CAUSES °Many different viruses can infect the tissues lining the upper respiratory tract. The tissues become irritated and inflamed and often become very moist. Mucus production is also common. A cold is contagious. You can easily spread the virus to others by oral contact. This includes kissing, sharing a glass, coughing, or sneezing. Touching your mouth or nose and then touching a surface, which is then touched by another person, can also spread the virus. °SYMPTOMS  °Symptoms typically develop 1 to 3 days after you come in contact with a cold virus. Symptoms vary from person to person. They may include: °· Runny nose. °· Sneezing. °· Nasal congestion. °· Sinus irritation. °· Sore throat. °· Loss of voice (laryngitis). °· Cough. °· Fatigue. °· Muscle aches. °· Loss of appetite. °· Headache. °· Low-grade fever. °DIAGNOSIS  °You might diagnose your own cold based on familiar symptoms, since most people get a cold 2 to 3 times a year. Your caregiver can confirm this based on your exam. Most importantly, your caregiver can check that your symptoms are not due to another disease such as strep throat, sinusitis, pneumonia, asthma, or epiglottitis. Blood tests, throat tests, and X-rays are not necessary to diagnose a common cold, but they may sometimes be helpful in excluding other more serious diseases. Your caregiver will decide if any further tests are required. °RISKS AND COMPLICATIONS  °You may be at risk for a more severe case of the common cold if you smoke cigarettes, have chronic heart disease (such as heart failure) or lung disease (such as asthma), or if  you have a weakened immune system. The very young and very old are also at risk for more serious infections. Bacterial sinusitis, middle ear infections, and bacterial pneumonia can complicate the common cold. The common cold can worsen asthma and chronic obstructive pulmonary disease (COPD). Sometimes, these complications can require emergency medical care and may be life-threatening. °PREVENTION  °The best way to protect against getting a cold is to practice good hygiene. Avoid oral or hand contact with people with cold symptoms. Wash your hands often if contact occurs. There is no clear evidence that vitamin C, vitamin E, echinacea, or exercise reduces the chance of developing a cold. However, it is always recommended to get plenty of rest and practice good nutrition. °TREATMENT  °Treatment is directed at relieving symptoms. There is no cure. Antibiotics are not effective, because the infection is caused by a virus, not by bacteria. Treatment may include: °· Increased fluid intake. Sports drinks offer valuable electrolytes, sugars, and fluids. °· Breathing heated mist or steam (vaporizer or shower). °· Eating chicken soup or other clear broths, and maintaining good nutrition. °· Getting plenty of rest. °· Using gargles or lozenges for comfort. °· Controlling fevers with ibuprofen or acetaminophen as directed by your caregiver. °· Increasing usage of your inhaler if you have asthma. °Zinc gel and zinc lozenges, taken in the first 24 hours of the common cold, can shorten the duration and lessen the severity of symptoms. Pain medicines may help with fever, muscle aches, and throat pain. A variety of non-prescription medicines are available to treat congestion and runny nose. Your caregiver   can make recommendations and may suggest nasal or lung inhalers for other symptoms.  °HOME CARE INSTRUCTIONS  °· Only take over-the-counter or prescription medicines for pain, discomfort, or fever as directed by your  caregiver. °· Use a warm mist humidifier or inhale steam from a shower to increase air moisture. This may keep secretions moist and make it easier to breathe. °· Drink enough water and fluids to keep your urine clear or pale yellow. °· Rest as needed. °· Return to work when your temperature has returned to normal or as your caregiver advises. You may need to stay home longer to avoid infecting others. You can also use a face mask and careful hand washing to prevent spread of the virus. °SEEK MEDICAL CARE IF:  °· After the first few days, you feel you are getting worse rather than better. °· You need your caregiver's advice about medicines to control symptoms. °· You develop chills, worsening shortness of breath, or brown or red sputum. These may be signs of pneumonia. °· You develop yellow or brown nasal discharge or pain in the face, especially when you bend forward. These may be signs of sinusitis. °· You develop a fever, swollen neck glands, pain with swallowing, or white areas in the back of your throat. These may be signs of strep throat. °SEEK IMMEDIATE MEDICAL CARE IF:  °· You have a fever. °· You develop severe or persistent headache, ear pain, sinus pain, or chest pain. °· You develop wheezing, a prolonged cough, cough up blood, or have a change in your usual mucus (if you have chronic lung disease). °· You develop sore muscles or a stiff neck. °Document Released: 12/05/2000 Document Revised: 09/03/2011 Document Reviewed: 09/16/2013 °ExitCare® Patient Information ©2015 [employer]. This information is not intended to replace advice given to you by your health care provider. Make sure you discuss any questions you have with your health care provider. ° °Emergency Department Resource Guide °1) Find a Doctor and Pay Out of Pocket °Although you won't have to find out who is covered by your insurance plan, it is a good idea to ask around and get recommendations. You will then need to call the office and see if  the doctor you have chosen will accept you as a new patient and what types of options they offer for patients who are self-pay. Some doctors offer discounts or will set up payment plans for their patients who do not have insurance, but you will need to ask so you aren't surprised when you get to your appointment. ° °2) Contact Your Local Health Department °Not all health departments have doctors that can see patients for sick visits, but many do, so it is worth a call to see if yours does. If you don't know where your local health department is, you can check in your phone book. The CDC also has a tool to help you locate your state's health department, and many state websites also have listings of all of their local health departments. ° °3) Find a Walk-in Clinic °If your illness is not likely to be very severe or complicated, you may want to try a walk in clinic. These are popping up all over the country in pharmacies, drugstores, and shopping centers. They're usually staffed by nurse practitioners or physician assistants that have been trained to treat common illnesses and complaints. They're usually fairly quick and inexpensive. However, if you have serious medical issues or chronic medical problems, these are probably not your best option. ° °  No Primary Care Doctor: °- Call [facility] at  832-8000 - they can help you locate a primary care doctor that  accepts your insurance, provides certain services, etc. °- Physician Referral Service- 1-800-533-3463 ° °Chronic Pain Problems: °Organization         Address  Phone   Notes  °[facility]  (336) 297-2271 Patients need to be referred by their primary care doctor.  ° °Medication Assistance: °Organization         Address  Phone   Notes  °[place] [facility] [address] °[place], [place] [address] (336) 641-8030 --Must be a resident of [place] °-- Must have NO insurance coverage whatsoever (no  Medicaid/ Medicare, etc.) °-- The pt. MUST have a primary care doctor that directs their care regularly and follows them in the community °  °[facility]  (866) 331-1348   °[employer]  (888) 892-1162   ° °Agencies that provide inexpensive medical care: °Organization         Address  Phone   Notes  °[facility]  (336) 832-8035   °[facility]    (336) 832-7272   °[facility] [address] °[place], [place] [address] (336) 832-4777   °[facility] [address], °[place] (336) 271-4999   °[facility]    (336) 373-0678   °[facility]    (336) 272-1050   °[facility] ° [address], [place] Phone:  (336) 832-4444, Fax:  (336) 832-4440 Hours of Operation:  9 am - 6 pm, M-F.  Also accepts Medicaid/Medicare and self-pay.  °[facility] ° [address], [place] Phone: (336) [phone], Fax: (336) [phone]. Hours of Operation:  8:30 am - 5:30 pm, M-F.  Also accepts Medicaid and self-pay.  °[facility] [place] [address], [place] Phone: (336) 878-6027   °[facility] [address], [place], [place] (336)723-1848, Ext. 123 Mondays & Thursdays: 7-9 AM.  First 15 patients are seen on a first come, first serve basis. °  ° °Medicaid-accepting [place] Providers: ° °Organization         Address  Phone   Notes  °[facility] [address], [place] (336) 641-2100 Also accepts self-pay patients.  °[facility] [address], [place] ° (336) 856-9996   °[facility] [address], [place] (336) 288-8857   °[facility] [address] [place] [address], [place] (336) 299-7000   °Veita Bland [address],  [place]  ° (336) 373-1557 Only accepts [place] Access Medicaid patients after they have their name applied to their card.   ° °Self-Pay (no insurance) in [place]: ° °Organization         Address  Phone   Notes  °Sickle Cell Patients, [facility] [address], [place] (336) 832-1970   °[facility] [address], [place] (336) 832-4400   °[facility] ° [address] [place] [address], [place] (336) 992-4800   °[facility]/Dr. Osei-Bonsu ° [address] [place] [address], [place] or [address], [place] (336) [phone] Phone number for both [place] and [place] locations is the same.  °[facility] [address],  (336) 299-0000   °  [facility] [address] [place] [address], [place] or [address] (336) 852-7530 °(336) 878-2260   °[facility] [address], [place] (336) 350-1642, phone; (336) 294-5005, fax Sees patients 1st and 3rd Saturday of every month.  Must not qualify for public or private insurance (i.e. Medicaid, Medicare, [place] Health Choice, Veterans' Benefits) • Household income should be no more than 200% of the poverty level •The clinic cannot treat you if you are pregnant or think you are pregnant • Sexually transmitted diseases are not treated at the clinic.  ° ° °Dental Care: °Organization         Address  Phone  Notes  °[facility] [address], [place] (336) 641-6152 Accepts children up to age 21 who are enrolled in Medicaid or [place] Health Choice; pregnant women with a Medicaid card; and children who have applied for Medicaid or [place] Health Choice, but were declined, whose parents can pay a reduced fee at time of service.  °[facility] [place]  [address], [place] (336) 641-7733 Accepts children up to age 21 who are enrolled in Medicaid or [place] Health Choice; pregnant women with a Medicaid card; and children who have applied for Medicaid or [place] Health Choice,  but were declined, whose parents can pay a reduced fee at time of service.  °[facility] ° [address], [place] (336) 641-4533 Patients are seen by appointment only. Walk-ins are not accepted. [facility] will see patients 18 years of age and older. °Monday - Tuesday (8am-5pm) °Most Wednesdays (8:30-5pm) °$30 per visit, cash only  °[facility] ° [address], [place] (336) 641-4533 Patients are seen by appointment only. Walk-ins are not accepted. [facility] will see patients 18 years of age and older. °One Wednesday Evening (Monthly: Volunteer Based).  $30 per visit, cash only  °[facility]  (919) 537-3737 for adults; Children under age 4, call Graduate Pediatric Dentistry at ([phone]) [phone]. Children aged 4-14, please call ([phone]) [phone] to request a pediatric application. ° Dental services are provided in all areas of dental care including fillings, crowns and bridges, complete and partial dentures, implants, gum treatment, root canals, and extractions. Preventive care is also provided. Treatment is provided to both adults and children. °Patients are selected via a lottery and there is often a waiting list. °  °[facility] [address], °[place] ° (336) 763-8833 [email or web address] °  °[facility] [address], [place], [place] (336)723-1848, Ext. 123 Second and Fourth Thursday of each month, opens at 6:30 AM; Clinic ends at 9 AM.  Patients are seen on a first-come first-served basis, and a limited number are seen during each clinic.  ° °[facility] ° [address], [place], [place] (336) 723-7904   Eligibility Requirements °You must have lived in [place], [place], or [place] counties for at least the last three months. °  You cannot be eligible for state or federal sponsored healthcare insurance, including Veterans Administration, Medicaid, or Medicare. °  You generally  cannot be eligible for healthcare insurance through your employer.  °  How to apply: °Eligibility screenings are held every Tuesday and Wednesday afternoon from 1:00 pm until 4:00 pm. You do not need an appointment for the interview!  °[facility] [address], [place], [place] 336-631-2330   °  [facility] [facility]  336-342-8273   °[facility]  336-703-3100   °[facility]  336-570-6415   ° °Behavioral Health Resources in the Community: °Intensive Outpatient Programs °Organization         Address  Phone  Notes  °[facility] [address], [place], [place] 336-878-6098   °[facility] [address], [name], [place] 336-832-9800   °[facility] [address], [place], [place] ° 336-882-2125   °[facility] [address],  °[place], [place] 1-800-853-5163 or 336-641-4981   °Substance Abuse Resources °Organization         Address  Phone  Notes  °[facility]  336-882-2125   °[facility]  336-784-9470   °[facility]  336-285-9073   °[facility]  336-845-3988   °Residential & Outpatient Substance Abuse Program  1-800-659-3381   °Psychological Services °Organization         Address  Phone  Notes  °[facility]  336- 832-9600   °[facility]  336- 378-7881   °[facility] [address], [place] 1-800-853-5163 or 336-641-4981   ° °Mobile Crisis Teams °Organization         Address  Phone  Notes  °[facility], Mobile Crisis Care Unit  1-877-626-1772   °Assertive °[facility] ° [address] [place], [place] 336-834-9664   °Sharon DeEsch [address] °[place] 336-554-5454   ° °Self-Help/Support Groups °Organization         Address  Phone             Notes  °[facility] - variety of support groups  336- 373-1402 Call for more  information  °Narcotics Anonymous (NA), [facility] [address], °[place]  2 meetings at this location  ° °Residential Treatment Programs °Organization         Address  Phone  Notes  °[facility] [address],    °[place]  1-866-801-8205   °[facility] ° [address], [place], [place] 704-293-8524   °[facility] [address], [place] 336-845-3988 Admissions: 8am-3pm M-F  °[facility] [address]    °[place], [place] 336-841-1104   °[facility] [address], [place], [place] 336-379-7146   °[facility] [address]  °[place], [place] 336-285-9073   °[facility] Intensive Outpatient [address], [place], [place] 336-852-3033   °[facility] [address]  °[place], [place] 1-877-615-2722 or 336-784-9470   °[facility] [address] [place], [place] 336-227-7417 Accepts Medicaid  °[facility] [address]  °[place] 1-800-659-3381 Substance Abuse/Addiction Treatment  ° °[place] Behavioral Health Resources °Organization         Address  Phone  Notes  °[facility]  (888) 581-9988   °Julie Brannon, PhD [address] [place], [place]   (336) 349-5553 or (336) 951-0000   °[facility]   [address] °[place], [place] (336) 349-4454   °[facility] [address], [place],  (336) 342-8316 Insurance/Medicaid/sponsorship through [facility]  °[facility] [address]                                      [place], [place] (336) 342-8316 Therapy/tele-psych/case  °[facility] [address]  ° [place], [place] (336) 349-2233    °Dr. Arfeen  (336) 349-4544   °[facility] [place]  [employer] [place] [facility] 1) [address],  °2) [address], [place] °3)  [address], [place] (336)  349-3220 °(336) 342-7768 ° °(336) 342-8140   °[place] [facility] (336) 342-1394 or (336) 342-3537 (After Hours)    ° ° °

## 2014-06-29 ENCOUNTER — Emergency Department (HOSPITAL_COMMUNITY)
Admission: EM | Admit: 2014-06-29 | Discharge: 2014-06-30 | Disposition: A | Discharge status: Home / Self Care | Payer: Medicaid Other | Attending: Emergency Medicine | Admitting: Emergency Medicine

## 2014-06-29 ENCOUNTER — Encounter (HOSPITAL_COMMUNITY): Payer: Self-pay | Admitting: Emergency Medicine

## 2014-06-29 ENCOUNTER — Emergency Department (HOSPITAL_COMMUNITY): Payer: Medicaid Other

## 2014-06-29 DIAGNOSIS — Z21 Asymptomatic human immunodeficiency virus [HIV] infection status: Secondary | ICD-10-CM | POA: Diagnosis not present

## 2014-06-29 DIAGNOSIS — R1011 Right upper quadrant pain: Secondary | ICD-10-CM | POA: Insufficient documentation

## 2014-06-29 DIAGNOSIS — R110 Nausea: Secondary | ICD-10-CM | POA: Insufficient documentation

## 2014-06-29 DIAGNOSIS — Z3202 Encounter for pregnancy test, result negative: Secondary | ICD-10-CM | POA: Diagnosis not present

## 2014-06-29 DIAGNOSIS — I10 Essential (primary) hypertension: Secondary | ICD-10-CM | POA: Diagnosis not present

## 2014-06-29 DIAGNOSIS — Z8719 Personal history of other diseases of the digestive system: Secondary | ICD-10-CM | POA: Insufficient documentation

## 2014-06-29 DIAGNOSIS — Z791 Long term (current) use of non-steroidal anti-inflammatories (NSAID): Secondary | ICD-10-CM | POA: Diagnosis not present

## 2014-06-29 DIAGNOSIS — E669 Obesity, unspecified: Secondary | ICD-10-CM | POA: Diagnosis not present

## 2014-06-29 DIAGNOSIS — Z79899 Other long term (current) drug therapy: Secondary | ICD-10-CM | POA: Insufficient documentation

## 2014-06-29 LAB — CBC WITH DIFFERENTIAL/PLATELET
Basophils Absolute: 0 10*3/uL (ref 0.0–0.1)
Basophils Relative: 0 % (ref 0–1)
EOS PCT: 1 % (ref 0–5)
Eosinophils Absolute: 0.1 10*3/uL (ref 0.0–0.7)
HCT: 35.7 % — ABNORMAL LOW (ref 36.0–46.0)
Hemoglobin: 11.7 g/dL — ABNORMAL LOW (ref 12.0–15.0)
LYMPHS ABS: 1.1 10*3/uL (ref 0.7–4.0)
Lymphocytes Relative: 19 % (ref 12–46)
MCH: 28.4 pg (ref 26.0–34.0)
MCHC: 32.8 g/dL (ref 30.0–36.0)
MCV: 86.7 fL (ref 78.0–100.0)
MONO ABS: 0.7 10*3/uL (ref 0.1–1.0)
Monocytes Relative: 11 % (ref 3–12)
Neutro Abs: 4.1 10*3/uL (ref 1.7–7.7)
Neutrophils Relative %: 69 % (ref 43–77)
PLATELETS: 230 10*3/uL (ref 150–400)
RBC: 4.12 MIL/uL (ref 3.87–5.11)
RDW: 13.2 % (ref 11.5–15.5)
WBC: 5.9 10*3/uL (ref 4.0–10.5)

## 2014-06-29 MED ORDER — SODIUM CHLORIDE 0.9 % IV BOLUS (SEPSIS)
500.0000 mL | Freq: Once | INTRAVENOUS | Status: AC
  Administered 2014-06-29: 500 mL via INTRAVENOUS

## 2014-06-29 MED ORDER — MORPHINE SULFATE 4 MG/ML IJ SOLN
4.0000 mg | Freq: Once | INTRAMUSCULAR | Status: AC
  Administered 2014-06-29: 4 mg via INTRAVENOUS
  Filled 2014-06-29: qty 1

## 2014-06-29 MED ORDER — ONDANSETRON HCL 4 MG/2ML IJ SOLN
4.0000 mg | Freq: Once | INTRAMUSCULAR | Status: AC
  Administered 2014-06-29: 4 mg via INTRAVENOUS
  Filled 2014-06-29: qty 2

## 2014-06-29 NOTE — ED Provider Notes (Signed)
CSN: [id number]     Arrival date & time 06/29/14  2235 History   First MD Initiated Contact with Patient 06/29/14 2259     Chief Complaint  Patient presents with  . Abdominal Pain     (Consider location/radiation/quality/duration/timing/severity/associated sxs/prior Treatment) Patient is a 29 y.o. female presenting with abdominal pain. The history is provided by the patient and medical records. No language interpreter was used.  Abdominal Pain Associated symptoms: nausea   Associated symptoms: no chest pain, no constipation, no cough, no diarrhea, no dysuria, no fatigue, no fever, no hematuria, no shortness of breath and no vomiting      Brenda Tyler is a 29 y.o. female  with a hx of known gallstones, HIV with most recent CD4 count of 190 presents to the Emergency Department complaining of gradual, persistent, progressively worsening right upper quadrant abdominal pain radiating to her back onset one week ago. Patient reports she was taking pain medicine prescribed to her by this hospital however she has run out. Patient denies nausea or vomiting. She denies fevers or chills. She reports she just "can't take the pain anymore."  Nursing note reports that today at work patient got hot and sweaty and felt like she was going to pass out. She reports to me that at work her pain increases and that causes her to feel badly but she did not have a near syncopal episode. Associated symptoms. Nothing seems to make the pain better. Patient denies fever, chills, headache, neck pain, chest pain, shortness of breath, vomiting, diarrhea, weakness, dizziness, syncope or dysuria.  Past Medical History  Diagnosis Date  . HSV (herpes simplex virus) infection   . HIV (human immunodeficiency virus infection)   . Hypertension   . Obesity    Past Surgical History  Procedure Laterality Date  . Incision and drainage abscess      on abdomen and buttocks   Family History  Problem Relation Age of Onset  .  Hypertension Maternal Grandmother   . Diabetes Maternal Grandmother   . Heart disease Maternal Grandmother   . Cancer Maternal Grandmother   . Hypertension Paternal Grandmother   . Diabetes Paternal Grandmother   . Heart disease Paternal Grandmother   . Heart disease Mother    History  Substance Use Topics  . Smoking status: Never Smoker   . Smokeless tobacco: Never Used  . Alcohol Use: Not on file     Comment: occasional   OB History    Gravida Para Term Preterm AB TAB SAB Ectopic Multiple Living   Review of Systems  Constitutional: Negative for fever, diaphoresis, appetite change, fatigue and unexpected weight change.  HENT: Negative for mouth sores and trouble swallowing.   Respiratory: Negative for cough, chest tightness, shortness of breath, wheezing and stridor.   Cardiovascular: Negative for chest pain and palpitations.  Gastrointestinal: Positive for nausea and abdominal pain. Negative for vomiting, diarrhea, constipation, blood in stool, abdominal distention and rectal pain.  Genitourinary: Negative for dysuria, urgency, frequency, hematuria, flank pain and difficulty urinating.  Musculoskeletal: Negative for back pain, neck pain and neck stiffness.  Skin: Negative for rash.  Neurological: Negative for weakness.  Hematological: Negative for adenopathy.  Psychiatric/Behavioral: Negative for confusion.  All other systems reviewed and are negative.     Allergies  Review of patient's allergies indicates no known allergies.  Home Medications   Prior to Admission medications   Medication Sig  Start Date End Date Taking? Authorizing Provider  albuterol (PROVENTIL HFA;VENTOLIN HFA) 108 (90 BASE) MCG/ACT inhaler Inhale 1-2 puffs into the lungs every 6 (six) hours as needed for wheezing or shortness of breath. 06/10/14  Yes Courtney A Forcucci, PA-C  Cetirizine HCl 10 MG CAPS Take 1 capsule (10 mg total) by mouth at bedtime as needed (congestion).  06/10/14  Yes Courtney A Forcucci, PA-C  dicyclomine (BENTYL) 10 MG capsule Take 10 mg by mouth 2 (two) times daily. 06/21/14 07/05/14 Yes Historical Provider, MD  elvitegravir-cobicistat-emtricitabine-tenofovir (STRIBILD) 150-150-200-300 MG TABS tablet Take 1 tablet by mouth daily with breakfast. 04/23/14  Yes Ginnie Smart, MD  ibuprofen (ADVIL,MOTRIN) 800 MG tablet Take 1 tablet (800 mg total) by mouth 3 (three) times daily. 06/10/14  Yes Courtney A Forcucci, PA-C  omeprazole (PRILOSEC) 40 MG capsule Take 40 mg by mouth daily. 06/21/14 06/21/15 Yes Historical Provider, MD  ranitidine (ZANTAC) 150 MG tablet Take 1 tablet (150 mg total) by mouth 2 (two) times daily. 04/25/14  Yes Gilda Crease, MD  chlorpheniramine-HYDROcodone (TUSSIONEX PENNKINETIC ER) 10-8 MG/5ML LQCR Take 5 mLs by mouth every 12 (twelve) hours as needed for cough. Patient not taking: Reported on 06/29/2014 06/10/14   Courtney A Forcucci, PA-C  oxyCODONE-acetaminophen (PERCOCET/ROXICET) 5-325 MG per tablet Take 1-2 tablets by mouth every 8 (eight) hours as needed for moderate pain or severe pain. 06/30/14   Jobanny Mavis, PA-C  promethazine (PHENERGAN) 25 MG tablet Take 1 tablet (25 mg total) by mouth every 6 (six) hours as needed for nausea or vomiting. 06/30/14   Heena Woodbury, PA-C  sulfamethoxazole-trimethoprim (BACTRIM DS) 800-160 MG per tablet Take 1 tablet by mouth 3 (three) times a week. Patient not taking: Reported on 06/29/2014 04/23/14   Ginnie Smart, MD   BP 109/74 mmHg  Pulse 73  Temp(Src) 97.5 F (36.4 C) (Oral)  Resp 18  SpO2 100%  LMP 06/25/2008 Physical Exam  Constitutional: She appears well-developed and well-nourished. No distress.  Awake, alert, nontoxic appearance  HENT:  Head: Normocephalic and atraumatic.  Mouth/Throat: Oropharynx is clear and moist. No oropharyngeal exudate.  Eyes: Conjunctivae are normal. No scleral icterus.  Neck: Normal range of motion. Neck supple.   Cardiovascular: Normal rate, regular rhythm, normal heart sounds and intact distal pulses.   No murmur heard. Pulmonary/Chest: Effort normal and breath sounds normal. No respiratory distress. She has no wheezes.  Equal chest expansion  Abdominal: Soft. Bowel sounds are normal. She exhibits no distension and no mass. There is tenderness in the right upper quadrant. There is positive Murphy's sign. There is no rebound, no guarding and no CVA tenderness.  Obese Right upper quadrant abdominal tenderness with positive Murphy sign No guarding, rebound or peritoneal signs   Musculoskeletal: Normal range of motion. She exhibits no edema.  Neurological: She is alert.  Speech is clear and goal oriented Moves extremities without ataxia  Skin: Skin is warm and dry. She is not diaphoretic. No erythema.  Psychiatric: She has a normal mood and affect.  Nursing note and vitals reviewed.   ED Course  Procedures (including critical care time) Labs Review Labs Reviewed  CBC WITH DIFFERENTIAL - Abnormal; Notable for the following:    Hemoglobin 11.7 (*)    HCT 35.7 (*)    All other components within normal limits  COMPREHENSIVE METABOLIC PANEL - Abnormal; Notable for the following:    Potassium 3.2 (*)    Total Protein 8.4 (*)    All other components within normal  limits  URINALYSIS, ROUTINE W REFLEX MICROSCOPIC - Abnormal; Notable for the following:    APPearance CLOUDY (*)    Hgb urine dipstick MODERATE (*)    Protein, ur 100 (*)    Nitrite POSITIVE (*)    Leukocytes, UA LARGE (*)    All other components within normal limits  URINE MICROSCOPIC-ADD ON - Abnormal; Notable for the following:    Squamous Epithelial / LPF FEW (*)    Bacteria, UA MANY (*)    All other components within normal limits  LIPASE, BLOOD  POC URINE PREG, ED    Imaging Review Koreas Abdomen Limited Ruq  06/30/2014   CLINICAL DATA:  Right upper quadrant abdominal pain. History of cholelithiasis.  EXAM: US ABDOMEN LIMITED  - RIGHT UPPER QUADRANT  COMPARISON:  None.  FINDINGS: Gallbladder:  Cholelithiasis with single stone in the dependent gallbladder measuring 8 mm diameter. Internal echoes suggests sludge. No gallbladder wall thickening or edema. Murphy's sign is negative.  Common bile duct:  Diameter: 4.6 mm, normal  Liver:  No focal lesion identified. Within normal limits in parenchymal echogenicity.  Examination is limited by patient's body habitus.  IMPRESSION: Cholelithiasis with single stone and sludge in the gallbladder. No additional findings to suggest cholecystitis.   Electronically Signed   By: Burman NievesWilliam  Stevens M.D.   On: 06/30/2014 00:18     EKG Interpretation None      MDM   Final diagnoses:  RUQ abdominal pain  History of cholelithiasis   Brenda Tyler presents with hx of gallstones and RUQ abd pain.  Shaw alert, oriented, nontoxic and nonseptic appearing. Patient without fever or tachycardia. Normotensive. Patient reports significant pain however is laughing and joking in the room. Will obtain labs, ultrasound to rule out cholecystitis and give pain control.  1:02 AM Labs reassuring without elevation in AST, ALT or lipase.  Found with cholelithiasis with single stone and sludge but no evidence of cholecystitis.  Patient is resting comfortably this time. Tolerating by mouth.  We'll discharge home with close follow-up with [facility]. Patient reports that she has an appointment with them on 07/08/2013.  Patient is nontoxic, nonseptic appearing, in no apparent distress.  Patient's pain and other symptoms adequately managed in emergency department.  Fluid bolus given.  Labs, imaging and vitals reviewed.  Patient does not meet the SIRS or Sepsis criteria.  On repeat exam patient does not have a surgical abdomin and there are no peritoneal signs.  No indication of appendicitis, bowel obstruction, bowel perforation, cholecystitis, diverticulitis, PID or ectopic pregnancy.  Patient discharged  home with symptomatic treatment and given strict instructions for follow-up with their primary care physician.  I have also discussed reasons to return immediately to the ER.  Patient expresses understanding and agrees with plan.   I have personally reviewed patient's vitals, nursing note and any pertinent labs or imaging.  I performed an undressed physical exam.    It has been determined that no acute conditions requiring further emergency intervention are present at this time. The patient/guardian have been advised of the diagnosis and plan. I reviewed all labs and imaging including any potential incidental findings. We have discussed signs and symptoms that warrant return to the ED and they are listed in the discharge instructions.    Vital signs are stable at discharge.   BP 109/74 mmHg  Pulse 73  Temp(Src) 97.5 F (36.4 C) (Oral)  Resp 18  SpO2 100%  LMP 06/25/2008  06/30/13 @ 11:29PM Pt UA  resulted after her d/c from the ER with evidence of UTI.  Discussed with Joyce Gross who is flow Dentist.  Pt will be contacted in the AM with Rx for Keflex.      Dahlia Client Jazmina Muhlenkamp, PA-C 07/01/14 0038  Loren Racer, MD 07/02/14 0005

## 2014-06-29 NOTE — ED Notes (Signed)
Pt states she has known gallstones and for the past week she has been having pain   Pt states the pain has increased today  Pt states today at work she got hot and sweaty and felt like she was going to pass out  Pt states she had some medication for this a long time ago and is out

## 2014-06-30 ENCOUNTER — Telehealth: Payer: Self-pay | Admitting: Internal Medicine

## 2014-06-30 LAB — COMPREHENSIVE METABOLIC PANEL
ALT: 18 U/L (ref 0–35)
AST: 22 U/L (ref 0–37)
Albumin: 4 g/dL (ref 3.5–5.2)
Alkaline Phosphatase: 106 U/L (ref 39–117)
Anion gap: 5 (ref 5–15)
BUN: 15 mg/dL (ref 6–23)
CO2: 27 mmol/L (ref 19–32)
CREATININE: 0.72 mg/dL (ref 0.50–1.10)
Calcium: 9 mg/dL (ref 8.4–10.5)
Chloride: 108 mEq/L (ref 96–112)
GFR calc non Af Amer: >90 mL/min (ref >90)
Glucose, Bld: 94 mg/dL (ref 70–99)
Potassium: 3.2 mmol/L — ABNORMAL LOW (ref 3.5–5.1)
Sodium: 140 mmol/L (ref 135–145)
TOTAL PROTEIN: 8.4 g/dL — AB (ref 6.0–8.3)
Total Bilirubin: 0.8 mg/dL (ref 0.3–1.2)

## 2014-06-30 LAB — URINE MICROSCOPIC-ADD ON

## 2014-06-30 LAB — URINALYSIS, ROUTINE W REFLEX MICROSCOPIC
BILIRUBIN URINE: NEGATIVE
GLUCOSE, UA: NEGATIVE mg/dL
KETONES UR: NEGATIVE mg/dL
NITRITE: POSITIVE — AB
PH: 6 (ref 5.0–8.0)
Protein, ur: 100 mg/dL — AB
Specific Gravity, Urine: 1.017 (ref 1.005–1.030)
UROBILINOGEN UA: 0.2 mg/dL (ref 0.0–1.0)

## 2014-06-30 LAB — POC URINE PREG, ED: Preg Test, Ur: NEGATIVE

## 2014-06-30 LAB — LIPASE, BLOOD: Lipase: 46 U/L (ref 11–59)

## 2014-06-30 MED ORDER — OXYCODONE-ACETAMINOPHEN 5-325 MG PO TABS: 1.0000 | ORAL_TABLET | Freq: Three times a day (TID) | ORAL | Status: DC | PRN

## 2014-06-30 MED ORDER — HYDROMORPHONE HCL 1 MG/ML IJ SOLN
1.0000 mg | Freq: Once | INTRAMUSCULAR | Status: AC
  Administered 2014-06-30: 1 mg via INTRAVENOUS
  Filled 2014-06-30: qty 1

## 2014-06-30 MED ORDER — PROMETHAZINE HCL 25 MG PO TABS: 25.0000 mg | ORAL_TABLET | Freq: Four times a day (QID) | ORAL | Status: DC | PRN

## 2014-06-30 NOTE — ED Notes (Signed)
Pt given apple juice and crackers.  

## 2014-06-30 NOTE — Discharge Instructions (Signed)
1. Medications: phenergan, percocet, usual home medications 2. Treatment: rest, drink plenty of fluids, advance diet slowly 3. Follow Up: Please followup with your primary doctor in 2 days for discussion of your diagnoses and further evaluation after today's visit; if you do not have a primary care doctor use the resource guide provided to find one; Please return to the ER for persistent vomiting, high fevers or worsening symptoms   Biliary Colic  Biliary colic is a steady or irregular pain in the upper abdomen. It is usually under the right side of the rib cage. It happens when gallstones interfere with the normal flow of bile from the gallbladder. Bile is a liquid that helps to digest fats. Bile is made in the liver and stored in the gallbladder. When you eat a meal, bile passes from the gallbladder through the cystic duct and the common bile duct into the small intestine. There, it mixes with partially digested food. If a gallstone blocks either of these ducts, the normal flow of bile is blocked. The muscle cells in the bile duct contract forcefully to try to move the stone. This causes the pain of biliary colic.  SYMPTOMS   A person with biliary colic usually complains of pain in the upper abdomen. This pain can be:  In the center of the upper abdomen just below the breastbone.  In the upper-right part of the abdomen, near the gallbladder and liver.  Spread back toward the right shoulder blade.  Nausea and vomiting.  The pain usually occurs after eating.  Biliary colic is usually triggered by the digestive system's demand for bile. The demand for bile is high after fatty meals. Symptoms can also occur when a person who has been fasting suddenly eats a very large meal. Most episodes of biliary colic pass after 1 to 5 hours. After the most intense pain passes, your abdomen may continue to ache mildly for about 24 hours. DIAGNOSIS  After you describe your symptoms, your caregiver will perform  a physical exam. He or she will pay attention to the upper right portion of your belly (abdomen). This is the area of your liver and gallbladder. An ultrasound will help your caregiver look for gallstones. Specialized scans of the gallbladder may also be done. Blood tests may be done, especially if you have fever or if your pain persists. PREVENTION  Biliary colic can be prevented by controlling the risk factors for gallstones. Some of these risk factors, such as heredity, increasing age, and pregnancy are a normal part of life. Obesity and a high-fat diet are risk factors you can change through a healthy lifestyle. Women going through menopause who take hormone replacement therapy (estrogen) are also more likely to develop biliary colic. TREATMENT   Pain medication may be prescribed.  You may be encouraged to eat a fat-free diet.  If the first episode of biliary colic is severe, or episodes of colic keep retuning, surgery to remove the gallbladder (cholecystectomy) is usually recommended. This procedure can be done through small incisions using an instrument called a laparoscope. The procedure often requires a brief stay in the hospital. Some people can leave the hospital the same day. It is the most widely used treatment in people troubled by painful gallstones. It is effective and safe, with no complications in more than 90% of cases.  If surgery cannot be done, medication that dissolves gallstones may be used. This medication is expensive and can take months or years to work. Only small stones will  dissolve.  Rarely, medication to dissolve gallstones is combined with a procedure called shock-wave lithotripsy. This procedure uses carefully aimed shock waves to break up gallstones. In many people treated with this procedure, gallstones form again within a few years. PROGNOSIS  If gallstones block your cystic duct or common bile duct, you are at risk for repeated episodes of biliary colic. There is  also a 25% chance that you will develop a gallbladder infection(acute cholecystitis), or some other complication of gallstones within 10 to 20 years. If you have surgery, schedule it at a time that is convenient for you and at a time when you are not sick. HOME CARE INSTRUCTIONS   Drink plenty of clear fluids.  Avoid fatty, greasy or fried foods, or any foods that make your pain worse.  Take medications as directed. SEEK MEDICAL CARE IF:   You develop a fever over 100.5 F (38.1 C).  Your pain gets worse over time.  You develop nausea that prevents you from eating and drinking.  You develop vomiting. SEEK IMMEDIATE MEDICAL CARE IF:   You have continuous or severe belly (abdominal) pain which is not relieved with medications.  You develop nausea and vomiting which is not relieved with medications.  You have symptoms of biliary colic and you suddenly develop a fever and shaking chills. This may signal cholecystitis. Call your caregiver immediately.  You develop a yellow color to your skin or the white part of your eyes (jaundice). Document Released: 11/12/2005 Document Revised: 09/03/2011 Document Reviewed: 01/22/2008 [facility] Patient Information 2015 [place], [place]. This information is not intended to replace advice given to you by your health care provider. Make sure you discuss any questions you have with your health care provider.

## 2014-06-30 NOTE — Telephone Encounter (Signed)
I was called by Brenda Tyler this evening. She was able to start Stribild after her visit with Dr. Ninetta LightsHatcher in late November. She's also continue to take her trimethoprim sulfamethoxazole. Apparently she recently developed some abdominal and chest pain. She was seen in the emergency department yesterday where she was noted to have some right upper quadrant pain on examination. Abdominal ultrasound showed a single stone and sludge in the gallbladder without other findings to suggest cholecystitis. She was given a prescription for oxycodone-acetaminophen. She filled the prescription but has not taken any. She called complaining of pain and asking what she could do for it. She feels like she's also been having some chills. She does not have a monitor. I suggested that she try to obtain a thermometer to take her temperature and get some generic acetaminophen. If that does not help her pain she may take one of the oxycodone acetaminophen. [facility]. I asked her to call our clinic if she is not feeling better before that time.

## 2014-07-01 ENCOUNTER — Emergency Department (HOSPITAL_COMMUNITY): Payer: Medicaid Other

## 2014-07-01 ENCOUNTER — Telehealth: Payer: Self-pay | Admitting: *Deleted

## 2014-07-01 ENCOUNTER — Inpatient Hospital Stay (HOSPITAL_COMMUNITY)
Admission: EM | Admit: 2014-07-01 | Discharge: 2014-07-05 | DRG: 975 | Disposition: A | Discharge status: Home / Self Care | Payer: Medicaid Other | Attending: Internal Medicine | Admitting: Internal Medicine

## 2014-07-01 ENCOUNTER — Telehealth (HOSPITAL_BASED_OUTPATIENT_CLINIC_OR_DEPARTMENT_OTHER): Payer: Self-pay | Admitting: Emergency Medicine

## 2014-07-01 ENCOUNTER — Encounter (HOSPITAL_COMMUNITY): Payer: Self-pay

## 2014-07-01 DIAGNOSIS — R110 Nausea: Secondary | ICD-10-CM | POA: Diagnosis present

## 2014-07-01 DIAGNOSIS — J028 Acute pharyngitis due to other specified organisms: Secondary | ICD-10-CM | POA: Diagnosis present

## 2014-07-01 DIAGNOSIS — B001 Herpesviral vesicular dermatitis: Secondary | ICD-10-CM | POA: Diagnosis present

## 2014-07-01 DIAGNOSIS — A419 Sepsis, unspecified organism: Principal | ICD-10-CM | POA: Diagnosis present

## 2014-07-01 DIAGNOSIS — R519 Headache, unspecified: Secondary | ICD-10-CM

## 2014-07-01 DIAGNOSIS — B20 Human immunodeficiency virus [HIV] disease: Secondary | ICD-10-CM | POA: Diagnosis present

## 2014-07-01 DIAGNOSIS — R87613 High grade squamous intraepithelial lesion on cytologic smear of cervix (HGSIL): Secondary | ICD-10-CM | POA: Diagnosis present

## 2014-07-01 DIAGNOSIS — E669 Obesity, unspecified: Secondary | ICD-10-CM | POA: Diagnosis present

## 2014-07-01 DIAGNOSIS — K8020 Calculus of gallbladder without cholecystitis without obstruction: Secondary | ICD-10-CM | POA: Diagnosis present

## 2014-07-01 DIAGNOSIS — B9620 Unspecified Escherichia coli [E. coli] as the cause of diseases classified elsewhere: Secondary | ICD-10-CM | POA: Diagnosis present

## 2014-07-01 DIAGNOSIS — Z9119 Patient's noncompliance with other medical treatment and regimen: Secondary | ICD-10-CM | POA: Diagnosis present

## 2014-07-01 DIAGNOSIS — N390 Urinary tract infection, site not specified: Secondary | ICD-10-CM | POA: Diagnosis present

## 2014-07-01 DIAGNOSIS — Z9114 Patient's other noncompliance with medication regimen: Secondary | ICD-10-CM | POA: Diagnosis present

## 2014-07-01 DIAGNOSIS — R51 Headache: Secondary | ICD-10-CM

## 2014-07-01 DIAGNOSIS — B3789 Other sites of candidiasis: Secondary | ICD-10-CM | POA: Diagnosis present

## 2014-07-01 DIAGNOSIS — I10 Essential (primary) hypertension: Secondary | ICD-10-CM | POA: Diagnosis present

## 2014-07-01 DIAGNOSIS — R509 Fever, unspecified: Secondary | ICD-10-CM

## 2014-07-01 DIAGNOSIS — R350 Frequency of micturition: Secondary | ICD-10-CM | POA: Diagnosis present

## 2014-07-01 DIAGNOSIS — Z6841 Body Mass Index (BMI) 40.0 and over, adult: Secondary | ICD-10-CM

## 2014-07-01 DIAGNOSIS — B370 Candidal stomatitis: Secondary | ICD-10-CM | POA: Diagnosis present

## 2014-07-01 LAB — CBC WITH DIFFERENTIAL/PLATELET
BASOS PCT: 0 % (ref 0–1)
Basophils Absolute: 0 10*3/uL (ref 0.0–0.1)
EOS ABS: 0 10*3/uL (ref 0.0–0.7)
EOS PCT: 0 % (ref 0–5)
HCT: 37 % (ref 36.0–46.0)
HEMOGLOBIN: 11.8 g/dL — AB (ref 12.0–15.0)
LYMPHS ABS: 1 10*3/uL (ref 0.7–4.0)
Lymphocytes Relative: 15 % (ref 12–46)
MCH: 28 pg (ref 26.0–34.0)
MCHC: 31.9 g/dL (ref 30.0–36.0)
MCV: 87.9 fL (ref 78.0–100.0)
MONO ABS: 1.1 10*3/uL — AB (ref 0.1–1.0)
MONOS PCT: 17 % — AB (ref 3–12)
Neutro Abs: 4.5 10*3/uL (ref 1.7–7.7)
Neutrophils Relative %: 68 % (ref 43–77)
Platelets: 219 10*3/uL (ref 150–400)
RBC: 4.21 MIL/uL (ref 3.87–5.11)
RDW: 13.3 % (ref 11.5–15.5)
WBC: 6.7 10*3/uL (ref 4.0–10.5)

## 2014-07-01 LAB — I-STAT CG4 LACTIC ACID, ED: LACTIC ACID, VENOUS: 0.57 mmol/L (ref 0.5–2.2)

## 2014-07-01 MED ORDER — MORPHINE SULFATE 4 MG/ML IJ SOLN
4.0000 mg | Freq: Once | INTRAMUSCULAR | Status: AC
  Administered 2014-07-02: 4 mg via INTRAVENOUS
  Filled 2014-07-01: qty 1

## 2014-07-01 MED ORDER — IBUPROFEN 800 MG PO TABS
800.0000 mg | ORAL_TABLET | Freq: Once | ORAL | Status: AC
  Administered 2014-07-01: 800 mg via ORAL
  Filled 2014-07-01: qty 1

## 2014-07-01 MED ORDER — VANCOMYCIN HCL 10 G IV SOLR
1500.0000 mg | Freq: Once | INTRAVENOUS | Status: AC
  Administered 2014-07-02: 1500 mg via INTRAVENOUS
  Filled 2014-07-01: qty 1500

## 2014-07-01 MED ORDER — PIPERACILLIN-TAZOBACTAM 3.375 G IVPB 30 MIN
3.3750 g | Freq: Once | INTRAVENOUS | Status: AC
  Administered 2014-07-02: 3.375 g via INTRAVENOUS
  Filled 2014-07-01: qty 50

## 2014-07-01 NOTE — ED Notes (Signed)
Pt complains of general body aches, a fever, and she states that she had vomited. Pt started taking Stribild for her HIV and she hasn't felt well since then.

## 2014-07-01 NOTE — ED Notes (Signed)
MD Onie made aware of fever and heart rate. MD in to see patient.

## 2014-07-01 NOTE — Telephone Encounter (Signed)
Post ED Visit - Positive Culture Follow-up: Successful Patient Follow-Up  Culture assessed and recommendations reviewed by: []  Wes Dulaney, Pharm.D., BCPS []  Celedonio MiyamotoJeremy Frens, [address] BCPS []  Georgina PillionElizabeth Martin, Pharm.D., BCPS []  [place] CityMinh Pham, [address]Ph[address] BCPS, AAHIVP []  Estella HuskMichelle Turner, Pharm.D., BCPS, AAHIVP []  Red ChristiansSamson Lee, Pharm.D. []  Tennis Mustassie Stewart, Pharm.D.  Positive urine culture  [x]  Patient discharged without antimicrobial prescription and treatment is now indicated []  Organism is resistant to prescribed ED discharge antimicrobial []  Patient with positive blood cultures  Changes discussed with ED provider: Dierdre ForthHannah Muthersbaugh New antibiotic prescription Keflex 500mg  po tid , 28 tabs [facility]  Contacted patient, 07/01/14    Berle MullMiller, Tiesha Marich 07/01/2014, 2:08 PM

## 2014-07-02 ENCOUNTER — Emergency Department (HOSPITAL_COMMUNITY): Payer: Medicaid Other

## 2014-07-02 ENCOUNTER — Encounter (HOSPITAL_COMMUNITY): Payer: Self-pay

## 2014-07-02 DIAGNOSIS — B370 Candidal stomatitis: Secondary | ICD-10-CM | POA: Diagnosis present

## 2014-07-02 DIAGNOSIS — R110 Nausea: Secondary | ICD-10-CM | POA: Diagnosis present

## 2014-07-02 DIAGNOSIS — A419 Sepsis, unspecified organism: Secondary | ICD-10-CM | POA: Diagnosis present

## 2014-07-02 DIAGNOSIS — B3789 Other sites of candidiasis: Secondary | ICD-10-CM | POA: Diagnosis present

## 2014-07-02 DIAGNOSIS — Z9119 Patient's noncompliance with other medical treatment and regimen: Secondary | ICD-10-CM | POA: Diagnosis present

## 2014-07-02 DIAGNOSIS — R87613 High grade squamous intraepithelial lesion on cytologic smear of cervix (HGSIL): Secondary | ICD-10-CM

## 2014-07-02 DIAGNOSIS — R509 Fever, unspecified: Secondary | ICD-10-CM

## 2014-07-02 DIAGNOSIS — Z9114 Patient's other noncompliance with medication regimen: Secondary | ICD-10-CM | POA: Diagnosis present

## 2014-07-02 DIAGNOSIS — B20 Human immunodeficiency virus [HIV] disease: Secondary | ICD-10-CM

## 2014-07-02 DIAGNOSIS — Z6841 Body Mass Index (BMI) 40.0 and over, adult: Secondary | ICD-10-CM | POA: Diagnosis not present

## 2014-07-02 DIAGNOSIS — E669 Obesity, unspecified: Secondary | ICD-10-CM | POA: Diagnosis present

## 2014-07-02 DIAGNOSIS — B9620 Unspecified Escherichia coli [E. coli] as the cause of diseases classified elsewhere: Secondary | ICD-10-CM | POA: Diagnosis present

## 2014-07-02 DIAGNOSIS — N390 Urinary tract infection, site not specified: Secondary | ICD-10-CM | POA: Diagnosis present

## 2014-07-02 DIAGNOSIS — J028 Acute pharyngitis due to other specified organisms: Secondary | ICD-10-CM | POA: Diagnosis present

## 2014-07-02 DIAGNOSIS — R350 Frequency of micturition: Secondary | ICD-10-CM | POA: Diagnosis present

## 2014-07-02 DIAGNOSIS — I10 Essential (primary) hypertension: Secondary | ICD-10-CM | POA: Diagnosis present

## 2014-07-02 DIAGNOSIS — K8020 Calculus of gallbladder without cholecystitis without obstruction: Secondary | ICD-10-CM | POA: Diagnosis present

## 2014-07-02 DIAGNOSIS — B001 Herpesviral vesicular dermatitis: Secondary | ICD-10-CM | POA: Diagnosis present

## 2014-07-02 LAB — COMPREHENSIVE METABOLIC PANEL
ALBUMIN: 3.6 g/dL (ref 3.5–5.2)
ALT: 18 U/L (ref 0–35)
ALT: 22 U/L (ref 0–35)
AST: 19 U/L (ref 0–37)
AST: 25 U/L (ref 0–37)
Albumin: 2.9 g/dL — ABNORMAL LOW (ref 3.5–5.2)
Alkaline Phosphatase: 75 U/L (ref 39–117)
Alkaline Phosphatase: 89 U/L (ref 39–117)
Anion gap: 7 (ref 5–15)
Anion gap: 9 (ref 5–15)
BILIRUBIN TOTAL: 0.7 mg/dL (ref 0.3–1.2)
BUN: 10 mg/dL (ref 6–23)
BUN: 11 mg/dL (ref 6–23)
CALCIUM: 8.6 mg/dL (ref 8.4–10.5)
CHLORIDE: 103 meq/L (ref 96–112)
CO2: 22 mmol/L (ref 19–32)
CO2: 22 mmol/L (ref 19–32)
Calcium: 7.7 mg/dL — ABNORMAL LOW (ref 8.4–10.5)
Chloride: 108 mEq/L (ref 96–112)
Creatinine, Ser: 0.82 mg/dL (ref 0.50–1.10)
Creatinine, Ser: 0.99 mg/dL (ref 0.50–1.10)
GFR calc Af Amer: >90 mL/min (ref >90)
GFR calc non Af Amer: >90 mL/min (ref >90)
GFR, EST AFRICAN AMERICAN: 88 mL/min — AB (ref >90)
GFR, EST NON AFRICAN AMERICAN: 76 mL/min — AB (ref >90)
GLUCOSE: 101 mg/dL — AB (ref 70–99)
Glucose, Bld: 143 mg/dL — ABNORMAL HIGH (ref 70–99)
POTASSIUM: 3.5 mmol/L (ref 3.5–5.1)
Potassium: 3.2 mmol/L — ABNORMAL LOW (ref 3.5–5.1)
SODIUM: 137 mmol/L (ref 135–145)
Sodium: 134 mmol/L — ABNORMAL LOW (ref 135–145)
Total Bilirubin: 0.8 mg/dL (ref 0.3–1.2)
Total Protein: 6.8 g/dL (ref 6.0–8.3)
Total Protein: 8.4 g/dL — ABNORMAL HIGH (ref 6.0–8.3)

## 2014-07-02 LAB — CBC WITH DIFFERENTIAL/PLATELET
BASOS PCT: 0 % (ref 0–1)
Basophils Absolute: 0 10*3/uL (ref 0.0–0.1)
EOS ABS: 0 10*3/uL (ref 0.0–0.7)
EOS PCT: 0 % (ref 0–5)
HCT: 31.4 % — ABNORMAL LOW (ref 36.0–46.0)
Hemoglobin: 10.2 g/dL — ABNORMAL LOW (ref 12.0–15.0)
Lymphocytes Relative: 17 % (ref 12–46)
Lymphs Abs: 0.8 10*3/uL (ref 0.7–4.0)
MCH: 28.4 pg (ref 26.0–34.0)
MCHC: 32.5 g/dL (ref 30.0–36.0)
MCV: 87.5 fL (ref 78.0–100.0)
Monocytes Absolute: 0.7 10*3/uL (ref 0.1–1.0)
Monocytes Relative: 17 % — ABNORMAL HIGH (ref 3–12)
NEUTROS PCT: 66 % (ref 43–77)
Neutro Abs: 3 10*3/uL (ref 1.7–7.7)
Platelets: 174 10*3/uL (ref 150–400)
RBC: 3.59 MIL/uL — ABNORMAL LOW (ref 3.87–5.11)
RDW: 13.3 % (ref 11.5–15.5)
WBC: 4.5 10*3/uL (ref 4.0–10.5)

## 2014-07-02 LAB — URINALYSIS, ROUTINE W REFLEX MICROSCOPIC
BILIRUBIN URINE: NEGATIVE
Glucose, UA: NEGATIVE mg/dL
Ketones, ur: NEGATIVE mg/dL
Nitrite: NEGATIVE
Protein, ur: 100 mg/dL — AB
Specific Gravity, Urine: 1.017 (ref 1.005–1.030)
UROBILINOGEN UA: 1 mg/dL (ref 0.0–1.0)
pH: 6 (ref 5.0–8.0)

## 2014-07-02 LAB — TROPONIN I: Troponin I: <0.03 ng/mL (ref <0.031)

## 2014-07-02 LAB — CBC
HCT: 32.6 % — ABNORMAL LOW (ref 36.0–46.0)
Hemoglobin: 10.6 g/dL — ABNORMAL LOW (ref 12.0–15.0)
MCH: 28.3 pg (ref 26.0–34.0)
MCHC: 32.5 g/dL (ref 30.0–36.0)
MCV: 87.2 fL (ref 78.0–100.0)
Platelets: 187 10*3/uL (ref 150–400)
RBC: 3.74 MIL/uL — AB (ref 3.87–5.11)
RDW: 13.1 % (ref 11.5–15.5)
WBC: 4.4 10*3/uL (ref 4.0–10.5)

## 2014-07-02 LAB — LACTIC ACID, PLASMA
LACTIC ACID, VENOUS: 0.4 mmol/L — AB (ref 0.5–2.2)
Lactic Acid, Venous: 0.5 mmol/L (ref 0.5–2.2)

## 2014-07-02 LAB — APTT: APTT: 30 s (ref 24–37)

## 2014-07-02 LAB — CREATININE, SERUM
Creatinine, Ser: 0.86 mg/dL (ref 0.50–1.10)
GFR calc Af Amer: >90 mL/min (ref >90)
GFR calc non Af Amer: >90 mL/min (ref >90)

## 2014-07-02 LAB — CORTISOL: CORTISOL PLASMA: 6.6 ug/dL

## 2014-07-02 LAB — TYPE AND SCREEN
ABO/RH(D): B POS
Antibody Screen: NEGATIVE

## 2014-07-02 LAB — URINE MICROSCOPIC-ADD ON

## 2014-07-02 LAB — PROTIME-INR
INR: 1.21 (ref 0.00–1.49)
Prothrombin Time: 15.4 seconds — ABNORMAL HIGH (ref 11.6–15.2)

## 2014-07-02 LAB — ABO/RH: ABO/RH(D): B POS

## 2014-07-02 LAB — POC URINE PREG, ED: Preg Test, Ur: NEGATIVE

## 2014-07-02 LAB — T-HELPER CELLS (CD4) COUNT (NOT AT ARMC)
CD4 % Helper T Cell: 20 % — ABNORMAL LOW (ref 33–55)
CD4 T Cell Abs: 100 /uL — ABNORMAL LOW (ref 400–2700)

## 2014-07-02 LAB — FIBRINOGEN: FIBRINOGEN: 573 mg/dL — AB (ref 204–475)

## 2014-07-02 LAB — CK: Total CK: 79 U/L (ref 7–177)

## 2014-07-02 MED ORDER — SULFAMETHOXAZOLE-TRIMETHOPRIM 800-160 MG PO TABS
1.0000 | ORAL_TABLET | ORAL | Status: DC
  Administered 2014-07-02 – 2014-07-05 (×2): 1 via ORAL
  Filled 2014-07-02 (×2): qty 1

## 2014-07-02 MED ORDER — SODIUM CHLORIDE 0.9 % IV BOLUS (SEPSIS)
2000.0000 mL | Freq: Once | INTRAVENOUS | Status: AC
  Administered 2014-07-02: 2000 mL via INTRAVENOUS

## 2014-07-02 MED ORDER — ONDANSETRON HCL 4 MG PO TABS
4.0000 mg | ORAL_TABLET | Freq: Four times a day (QID) | ORAL | Status: DC | PRN
  Administered 2014-07-02 – 2014-07-05 (×2): 4 mg via ORAL
  Filled 2014-07-02 (×2): qty 1

## 2014-07-02 MED ORDER — ELVITEG-COBIC-EMTRICIT-TENOFDF 150-150-200-300 MG PO TABS
1.0000 | ORAL_TABLET | Freq: Every day | ORAL | Status: DC
  Filled 2014-07-02 (×2): qty 1

## 2014-07-02 MED ORDER — MORPHINE SULFATE 2 MG/ML IJ SOLN: 1.0000 mg | INTRAMUSCULAR | Status: DC | PRN

## 2014-07-02 MED ORDER — IOHEXOL 300 MG/ML  SOLN
100.0000 mL | Freq: Once | INTRAMUSCULAR | Status: AC | PRN
  Administered 2014-07-02: 100 mL via INTRAVENOUS

## 2014-07-02 MED ORDER — ACYCLOVIR 5 % EX CREA
TOPICAL_CREAM | Freq: Every day | CUTANEOUS | Status: DC
  Filled 2014-07-02: qty 5

## 2014-07-02 MED ORDER — DIPHENHYDRAMINE HCL 50 MG/ML IJ SOLN
25.0000 mg | Freq: Once | INTRAMUSCULAR | Status: AC
  Administered 2014-07-02: 25 mg via INTRAVENOUS
  Filled 2014-07-02: qty 1

## 2014-07-02 MED ORDER — METOCLOPRAMIDE HCL 5 MG/ML IJ SOLN
10.0000 mg | Freq: Once | INTRAMUSCULAR | Status: AC
  Administered 2014-07-02: 10 mg via INTRAVENOUS
  Filled 2014-07-02: qty 2

## 2014-07-02 MED ORDER — PIPERACILLIN-TAZOBACTAM 3.375 G IVPB
3.3750 g | Freq: Three times a day (TID) | INTRAVENOUS | Status: DC
  Administered 2014-07-02 – 2014-07-05 (×10): 3.375 g via INTRAVENOUS
  Filled 2014-07-02 (×11): qty 50

## 2014-07-02 MED ORDER — NON FORMULARY: 1.0000 | Freq: Every day | Status: DC

## 2014-07-02 MED ORDER — HEPARIN SODIUM (PORCINE) 5000 UNIT/ML IJ SOLN
5000.0000 [IU] | Freq: Three times a day (TID) | INTRAMUSCULAR | Status: DC
  Administered 2014-07-02 – 2014-07-05 (×10): 5000 [IU] via SUBCUTANEOUS
  Filled 2014-07-02 (×12): qty 1

## 2014-07-02 MED ORDER — ACYCLOVIR 5 % EX OINT
TOPICAL_OINTMENT | Freq: Every day | CUTANEOUS | Status: DC
  Administered 2014-07-02 – 2014-07-05 (×12): via TOPICAL
  Filled 2014-07-02: qty 30

## 2014-07-02 MED ORDER — VANCOMYCIN HCL 10 G IV SOLR
1250.0000 mg | Freq: Three times a day (TID) | INTRAVENOUS | Status: DC
  Administered 2014-07-02: 1250 mg via INTRAVENOUS
  Filled 2014-07-02: qty 1250

## 2014-07-02 MED ORDER — POTASSIUM CHLORIDE CRYS ER 20 MEQ PO TBCR
40.0000 meq | EXTENDED_RELEASE_TABLET | Freq: Four times a day (QID) | ORAL | Status: AC
  Administered 2014-07-02 (×2): 40 meq via ORAL
  Filled 2014-07-02 (×2): qty 2

## 2014-07-02 MED ORDER — ONDANSETRON HCL 4 MG/2ML IJ SOLN
4.0000 mg | Freq: Four times a day (QID) | INTRAMUSCULAR | Status: DC | PRN
Start: 2014-07-02 — End: 2014-07-05
  Administered 2014-07-04 – 2014-07-05 (×3): 4 mg via INTRAVENOUS
  Filled 2014-07-02 (×3): qty 2

## 2014-07-02 MED ORDER — ELVITEG-COBIC-EMTRICIT-TENOFAF 150-150-200-10 MG PO TABS
1.0000 | ORAL_TABLET | Freq: Every day | ORAL | Status: DC
  Administered 2014-07-02 – 2014-07-05 (×4): 1 via ORAL
  Filled 2014-07-02 (×6): qty 1

## 2014-07-02 MED ORDER — ACETAMINOPHEN 325 MG PO TABS
650.0000 mg | ORAL_TABLET | Freq: Four times a day (QID) | ORAL | Status: DC | PRN
Start: 2014-07-02 — End: 2014-07-05
  Administered 2014-07-03: 650 mg via ORAL
  Filled 2014-07-02: qty 2

## 2014-07-02 MED ORDER — ACETAMINOPHEN 500 MG PO TABS
1000.0000 mg | ORAL_TABLET | Freq: Once | ORAL | Status: AC
  Administered 2014-07-02: 1000 mg via ORAL
  Filled 2014-07-02: qty 2

## 2014-07-02 MED ORDER — SODIUM CHLORIDE 0.9 % IV SOLN
INTRAVENOUS | Status: DC
  Administered 2014-07-02 – 2014-07-03 (×3): via INTRAVENOUS

## 2014-07-02 MED ORDER — HYDROCODONE-ACETAMINOPHEN 5-325 MG PO TABS
1.0000 | ORAL_TABLET | ORAL | Status: DC | PRN
  Administered 2014-07-02 – 2014-07-05 (×10): 1 via ORAL
  Filled 2014-07-02 (×4): qty 1
  Filled 2014-07-02: qty 2
  Filled 2014-07-02 (×5): qty 1

## 2014-07-02 NOTE — Progress Notes (Signed)
[facility] PROGRESS NOTE   Darcella Gasmanicole B Frary ZOX:[id number] DOB: 09/03/1985 DOA: 07/01/2014 PCP: No PCP Per Patient  HPI/Subjective: Feels better overall, complains about chills.  Assessment/Plan: Principal Problem:   Sepsis Active Problems:   HIV disease   Nausea without vomiting   UTI (lower urinary tract infection)    Sepsis Patient presented to the hospital with temp of 103 and pulse of 134 in the presence of infection. Blood and urine cultures obtained. Started on vancomycin and Zosyn initially, vancomycin discontinued. Sepsis is likely secondary to UTI.  UTI Patient presents with sepsis symptoms, urinalysis consistent with UTI. Started on vancomycin and Zosyn initially, continue Zosyn only. Urine and blood cultures obtained, just antibiotics according to the culture results.  HIV disease Patient is on Genvoya, continue HAART. As a courtesy I did notify Dr. Orvan Falconerampbell, recommended to recheck her CD4 and viral loads.  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  Code Status: Full Code Family Communication: Plan discussed with the patient. Disposition Plan: Remains inpatient, transfer out of stepdown.   Consultants:  None  Procedures:  None  Antibiotics:  Zosyn.  Vancomycin D/C on 1/8   Objective: Filed Vitals:   07/02/14 0800  BP:   Pulse:   Temp: 98.9 F (37.2 C)  Resp:     Intake/Output Summary (Last 24 hours) at 07/02/14 1100 Last data filed at 07/02/14 0641  Gross per 24 hour  Intake 826.67 ml  Output    200 ml  Net 626.67 ml   Filed Weights   07/02/14 0035 07/02/14 0400  Weight: 127.007 kg (280 lb) 129.7 kg (285 lb 15 oz)    Exam: General: Alert and awake, oriented x3, not in any acute distress. HEENT: anicteric sclera, pupils reactive to light and accommodation, EOMI CVS: S1-S2 clear, no murmur rubs or gallops Chest: clear to auscultation bilaterally,  no wheezing, rales or rhonchi Abdomen: soft nontender, nondistended, normal bowel sounds, no organomegaly Extremities: no cyanosis, clubbing or edema noted bilaterally Neuro: Cranial nerves II-XII intact, no focal neurological deficits  Data Reviewed: Basic Metabolic Panel:  Recent Labs Lab 06/29/14 2331 07/01/14 2333 07/02/14 0314 07/02/14 0530  NA 140 134*  --  137  K 3.2* 3.5  --  3.2*  CL 108 103  --  108  CO2 27 22  --  22  GLUCOSE 94 143*  --  101*  BUN 15 11  --  10  CREATININE 0.72 0.99 0.86 0.82  CALCIUM 9.0 8.6  --  7.7*   Liver Function Tests:  Recent Labs Lab 06/29/14 2331 07/01/14 2333 07/02/14 0530  AST 22 25 19   ALT 18 22 18   ALKPHOS 106 89 75  BILITOT 0.8 0.7 0.8  PROT 8.4* 8.4* 6.8  ALBUMIN 4.0 3.6 2.9*    Recent Labs Lab 06/29/14 2331  LIPASE 46   No results for input(s): AMMONIA in the last 168 hours. CBC:  Recent Labs Lab 06/29/14 2331 07/01/14 2333 07/02/14 0314 07/02/14 0530  WBC 5.9 6.7 4.4 4.5  NEUTROABS 4.1 4.5  --  3.0  HGB 11.7* 11.8* 10.6* 10.2*  HCT 35.7* 37.0 32.6* 31.4*  MCV 86.7 87.9 87.2 87.5  PLT 230 219 187 174   Cardiac Enzymes:  Recent Labs Lab 07/02/14 07/02/14 0314  CKTOTAL 79  --   TROPONINI  --  <0.03   BNP (last 3 results) No results for input(s): PROBNP in the last 8760 hours. CBG: No results for  input(s): GLUCAP in the last 168 hours.  Micro No results found for this or any previous visit (from the past 240 hour(s)).   Studies: Ct Head W Wo Contrast  07/02/2014   CLINICAL DATA:  Fever, headache, nausea and vomiting after beginning new HIV medication.  EXAM: CT HEAD WITHOUT AND WITH CONTRAST  TECHNIQUE: Contiguous axial images were obtained from the base of the skull through the vertex without and with intravenous contrast  CONTRAST:  OMNIPAQUE IOHEXOL 300 MG/ML  SOLN  COMPARISON:  None.  FINDINGS: The ventricles and sulci are normal. No intraparenchymal hemorrhage, mass effect nor midline  shift. No acute large vascular territory infarcts. Cerebellar tonsils at but not below the foramen magnum.  No abnormal extra-axial fluid collections. Basal cisterns are patent.  No skull fracture. The included ocular globes and orbital contents are non-suspicious. The mastoid aircells and included paranasal sinuses are well-aerated. Fullness of the nasopharyngeal soft tissues can be seen in immunocompromised states.  IMPRESSION: No acute intracranial process; normal noncontrast CT of the head   Electronically Signed   By: Awilda Metro   On: 07/02/2014 01:59   Dg Chest Port 1 View  07/02/2014   CLINICAL DATA:  Fever.  Generalized body aches and vomiting.  EXAM: PORTABLE CHEST - 1 VIEW  COMPARISON:  06/09/2014  FINDINGS: Lung volumes are low. Cardiomediastinal contours are normal. There are prominent bronchovascular markings, may reflect bronchitis versus accentuated by low lung volumes. No confluent airspace disease. No pleural effusion or pneumothorax. No acute osseous abnormality is seen.  IMPRESSION: Hypoventilatory chest. Prominent bronchovascular markings, may reflect bronchitis versus related to low lung volumes. No consolidation to suggest pneumonia.   Electronically Signed   By: Rubye Oaks M.D.   On: 07/02/2014 00:02    Scheduled Meds: . elvitegravir-cobicistat-emtricitabine-tenofovir  1 tablet Oral Q breakfast  . heparin  5,000 Units Subcutaneous 3 times per day  . piperacillin-tazobactam (ZOSYN)  IV  3.375 g Intravenous 3 times per day  . potassium chloride  40 mEq Oral Q6H  . sulfamethoxazole-trimethoprim  1 tablet Oral Once per day on Mon Wed Fri   Continuous Infusions: . sodium chloride 100 mL/hr at 07/02/14 0425       Time spent: 35 minutes    [facility] A  [facility] Pager [phone] If 7PM-7AM, please contact night-coverage at [email or web address], password [facility] 07/02/2014, 11:00 AM  LOS: 1 day

## 2014-07-02 NOTE — Progress Notes (Signed)
Pt stable for transfer to floor. Full report given to Hewlett-PackardWendy RN of 5E.

## 2014-07-02 NOTE — ED Provider Notes (Signed)
CSN: [id number]     Arrival date & time 07/01/14  2245 History   First MD Initiated Contact with Patient 07/01/14 2337     Chief Complaint  Patient presents with  . Fever  . Generalized Body Aches     (Consider location/radiation/quality/duration/timing/severity/associated sxs/prior Treatment) HPI   Darcella Gasmanicole B Schnarr is a 29 y.o. female with past medical history of HIV, hypertension, obesity coming in with diffuse body aches, fever. This started yesterday. She also has a nonproductive cough. According to records patient's CD4 count was 191 month ago. She also began a new HIV medication which she thinks is the cause for this. Patient describes abdominal pain and increased urinary frequency. She had one episode of emesis. She is not taking any medications to make this better. She denies any diarrhea. Finally patient complains of severe headache, she denies any stiff neck.  Patient has no further complaints.  10 Systems reviewed and are negative for acute change except as noted in the HPI.    Past Medical History  Diagnosis Date  . HSV (herpes simplex virus) infection   . HIV (human immunodeficiency virus infection)   . Hypertension   . Obesity    Past Surgical History  Procedure Laterality Date  . Incision and drainage abscess      on abdomen and buttocks   Family History  Problem Relation Age of Onset  . Hypertension Maternal Grandmother   . Diabetes Maternal Grandmother   . Heart disease Maternal Grandmother   . Cancer Maternal Grandmother   . Hypertension Paternal Grandmother   . Diabetes Paternal Grandmother   . Heart disease Paternal Grandmother   . Heart disease Mother    History  Substance Use Topics  . Smoking status: Never Smoker   . Smokeless tobacco: Never Used  . Alcohol Use: Not on file     Comment: occasional   OB History    Gravida Para Term Preterm AB TAB SAB Ectopic Multiple Living   4 3 3  1 1    3      Review of Systems    Allergies   Dilaudid  Home Medications   Prior to Admission medications   Medication Sig Start Date End Date Taking? Authorizing Provider  albuterol (PROVENTIL HFA;VENTOLIN HFA) 108 (90 BASE) MCG/ACT inhaler Inhale 1-2 puffs into the lungs every 6 (six) hours as needed for wheezing or shortness of breath. 06/10/14  Yes Courtney A Forcucci, PA-C  Cetirizine HCl 10 MG CAPS Take 1 capsule (10 mg total) by mouth at bedtime as needed (congestion). 06/10/14  Yes Courtney A Forcucci, PA-C  elvitegravir-cobicistat-emtricitabine-tenofovir (STRIBILD) 150-150-200-300 MG TABS tablet Take 1 tablet by mouth daily with breakfast. 04/23/14  Yes Ginnie SmartJeffrey C Hatcher, MD  HYDROcodone-acetaminophen (NORCO/VICODIN) 5-325 MG per tablet Take 1-2 tablets by mouth every 6 (six) hours as needed for moderate pain.   Yes Historical Provider, MD  ibuprofen (ADVIL,MOTRIN) 800 MG tablet Take 1 tablet (800 mg total) by mouth 3 (three) times daily. 06/10/14  Yes Courtney A Forcucci, PA-C  promethazine (PHENERGAN) 25 MG tablet Take 1 tablet (25 mg total) by mouth every 6 (six) hours as needed for nausea or vomiting. 06/30/14  Yes Hannah Muthersbaugh, PA-C  ranitidine (ZANTAC) 150 MG tablet Take 1 tablet (150 mg total) by mouth 2 (two) times daily. 04/25/14  Yes Gilda Creasehristopher J. Pollina, MD  chlorpheniramine-HYDROcodone (TUSSIONEX PENNKINETIC ER) 10-8 MG/5ML LQCR Take 5 mLs by mouth every 12 (twelve) hours as needed for cough. Patient not taking: Reported on  06/29/2014 06/10/14   Courtney A Forcucci, PA-C  oxyCODONE-acetaminophen (PERCOCET/ROXICET) 5-325 MG per tablet Take 1-2 tablets by mouth every 8 (eight) hours as needed for moderate pain or severe pain. Patient not taking: Reported on 07/01/2014 06/30/14   Dahlia Client Muthersbaugh, PA-C  sulfamethoxazole-trimethoprim (BACTRIM DS) 800-160 MG per tablet Take 1 tablet by mouth 3 (three) times a week. Patient not taking: Reported on 06/29/2014 04/23/14   Ginnie Smart, MD   BP 132/74 mmHg  Pulse 106   Temp(Src) 99.9 F (37.7 C) (Oral)  Resp 18  Wt 280 lb (127.007 kg)  SpO2 96%  LMP 06/25/2008 Physical Exam  Constitutional: She is oriented to person, place, and time. She appears well-developed and well-nourished. She appears distressed.  Obese female  HENT:  Head: Normocephalic and atraumatic.  Nose: Nose normal.  Mouth/Throat: Oropharynx is clear and moist.  Eyes: Conjunctivae and EOM are normal. Pupils are equal, round, and reactive to light. No scleral icterus.  Neck: Normal range of motion. Neck supple. No JVD present. No tracheal deviation present. No thyromegaly present.  Cardiovascular: Regular rhythm and normal heart sounds.  Exam reveals no gallop and no friction rub.   No murmur heard. Tachycardic  Pulmonary/Chest: Effort normal and breath sounds normal. No respiratory distress. She has no wheezes. She exhibits no tenderness.  Abdominal: Soft. Bowel sounds are normal. She exhibits no distension and no mass. There is tenderness. There is no rebound and no guarding.  Suprapubic tenderness to palpation  Musculoskeletal: Normal range of motion. She exhibits no edema or tenderness.  Lymphadenopathy:    She has no cervical adenopathy.  Neurological: She is alert and oriented to person, place, and time. No cranial nerve deficit. She exhibits normal muscle tone.  Normal strength and sensation 4 extremities, normal cerebellar testing, normal gait.  Skin: Skin is warm. No rash noted. She is diaphoretic. No erythema. No pallor.  Nursing note and vitals reviewed.   ED Course  Procedures (including critical care time) Labs Review Labs Reviewed  CBC WITH DIFFERENTIAL - Abnormal; Notable for the following:    Hemoglobin 11.8 (*)    Monocytes Relative 17 (*)    Monocytes Absolute 1.1 (*)    All other components within normal limits  COMPREHENSIVE METABOLIC PANEL - Abnormal; Notable for the following:    Sodium 134 (*)    Glucose, Bld 143 (*)    Total Protein 8.4 (*)    GFR  calc non Af Amer 76 (*)    GFR calc Af Amer 88 (*)    All other components within normal limits  URINALYSIS, ROUTINE W REFLEX MICROSCOPIC - Abnormal; Notable for the following:    APPearance TURBID (*)    Hgb urine dipstick MODERATE (*)    Protein, ur 100 (*)    Leukocytes, UA LARGE (*)    All other components within normal limits  URINE MICROSCOPIC-ADD ON - Abnormal; Notable for the following:    Squamous Epithelial / LPF FEW (*)    Bacteria, UA MANY (*)    All other components within normal limits  URINE CULTURE  CULTURE, BLOOD (ROUTINE X 2)  CULTURE, BLOOD (ROUTINE X 2)  CK  I-STAT CG4 LACTIC ACID, ED  POC URINE PREG, ED  I-STAT CG4 LACTIC ACID, ED    Imaging Review Ct Head W Wo Contrast  07/02/2014   CLINICAL DATA:  Fever, headache, nausea and vomiting after beginning new HIV medication.  EXAM: CT HEAD WITHOUT AND WITH CONTRAST  TECHNIQUE: Contiguous axial images  were obtained from the base of the skull through the vertex without and with intravenous contrast  CONTRAST:  OMNIPAQUE IOHEXOL 300 MG/ML  SOLN  COMPARISON:  None.  FINDINGS: The ventricles and sulci are normal. No intraparenchymal hemorrhage, mass effect nor midline shift. No acute large vascular territory infarcts. Cerebellar tonsils at but not below the foramen magnum.  No abnormal extra-axial fluid collections. Basal cisterns are patent.  No skull fracture. The included ocular globes and orbital contents are non-suspicious. The mastoid aircells and included paranasal sinuses are well-aerated. Fullness of the nasopharyngeal soft tissues can be seen in immunocompromised states.  IMPRESSION: No acute intracranial process; normal noncontrast CT of the head   Electronically Signed   By: Awilda Metro   On: 07/02/2014 01:59   Dg Chest Port 1 View  07/02/2014   CLINICAL DATA:  Fever.  Generalized body aches and vomiting.  EXAM: PORTABLE CHEST - 1 VIEW  COMPARISON:  06/09/2014  FINDINGS: Lung volumes are low.  Cardiomediastinal contours are normal. There are prominent bronchovascular markings, may reflect bronchitis versus accentuated by low lung volumes. No confluent airspace disease. No pleural effusion or pneumothorax. No acute osseous abnormality is seen.  IMPRESSION: Hypoventilatory chest. Prominent bronchovascular markings, may reflect bronchitis versus related to low lung volumes. No consolidation to suggest pneumonia.   Electronically Signed   By: Rubye Oaks M.D.   On: 07/02/2014 00:02     EKG Interpretation None      MDM   Final diagnoses:  Fever  Headache    Patient since emergency department for diffuse body aches, fever, headache. This and the setting of having AIDS. He was initially septic with fever to 39.4, heart rate 134. She was covered broadly with vancomycin and Zosyn, given 2 L of IV fluids. She is given Tylenol and Motrin. Repeat vital signs reveal temperature 37.7, pulse of 106. CT scan of head is obtained for ring-enhancing lesions. Urinalysis reveals an infection. Nursing staff informed me that the patient was diagnosed with this a couple days ago but never filled her antibiotic prescription. This is likely the source of her sepsis. Patient will be retained in the hospital for continued management.  CT scan of head was negative for any intracranial infection.   CRITICAL CARE Performed by: Tomasita Crumble   Total critical care time: Critical care time was exclusive of separately billable procedures and treating other patients.  Critical care was necessary to treat or prevent imminent or life-threatening deterioration.  Critical care was time spent personally by me on the following activities: development of treatment plan with patient and/or surrogate as well as nursing, discussions with consultants, evaluation of patient's response to treatment, examination of patient, obtaining history from patient or surrogate, ordering and performing treatments and  interventions, ordering and review of laboratory studies, ordering and review of radiographic studies, pulse oximetry and re-evaluation of patient's condition.    Tomasita Crumble, MD 07/02/14 [phone]

## 2014-07-02 NOTE — ED Notes (Signed)
Pt aware of the need for  A urine sample.

## 2014-07-02 NOTE — ED Notes (Signed)
Hospitalist at bedside 

## 2014-07-02 NOTE — Progress Notes (Signed)
ANTIBIOTIC CONSULT NOTE - INITIAL  Pharmacy Consult for Vancomycin and Zosyn  Indication: Sepsis  Allergies  Allergen Reactions  . Dilaudid [Hydromorphone Hcl] Other (See Comments)    Makes her dizzy    Patient Measurements: Weight: 280 lb (127.007 kg) Adjusted Body Weight:   Vital Signs: Temp: 98.4 F (36.9 C) (01/08 0211) Temp Source: Oral (01/08 0211) BP: 109/71 mmHg (01/08 0211) Pulse Rate: 92 (01/08 0211) Intake/Output from previous day:   Intake/Output from this shift:    Labs:  Recent Labs  06/29/14 2331 07/01/14 2333  WBC 5.9 6.7  HGB 11.7* 11.8*  PLT 230 219  CREATININE 0.72 0.99   Estimated Creatinine Clearance: 110.7 mL/min (by C-G formula based on Cr of 0.99). No results for input(s): VANCOTROUGH, VANCOPEAK, VANCORANDOM, GENTTROUGH, GENTPEAK, GENTRANDOM, TOBRATROUGH, TOBRAPEAK, TOBRARND, AMIKACINPEAK, AMIKACINTROU, AMIKACIN in the last 72 hours.   Microbiology: No results found for this or any previous visit (from the past 720 hour(s)).  Medical History: Past Medical History  Diagnosis Date  . HSV (herpes simplex virus) infection   . HIV (human immunodeficiency virus infection)   . Hypertension   . Obesity     Medications:  Anti-infectives    Start     Dose/Rate Route Frequency Ordered Stop   07/02/14 0900  sulfamethoxazole-trimethoprim (BACTRIM DS) 800-160 MG per tablet 1 tablet     1 tablet Oral Once per day on Mon Wed Fri 07/02/14 0242     07/02/14 0800  vancomycin (VANCOCIN) 1,250 mg in sodium chloride 0.9 % 250 mL IVPB     1,250 mg166.7 mL/hr over 90 Minutes Intravenous Every 8 hours 07/02/14 0252     07/02/14 0600  piperacillin-tazobactam (ZOSYN) IVPB 3.375 g     3.375 g12.5 mL/hr over 240 Minutes Intravenous 3 times per day 07/02/14 0252     07/02/14 0000  vancomycin (VANCOCIN) 1,500 mg in sodium chloride 0.9 % 500 mL IVPB     1,500 mg250 mL/hr over 120 Minutes Intravenous  Once 07/01/14 2359 07/02/14 0235   07/02/14 0000   piperacillin-tazobactam (ZOSYN) IVPB 3.375 g     3.375 g100 mL/hr over 30 Minutes Intravenous  Once 07/01/14 2359 07/02/14 0146     Assessment:  29 y.o. female with past medical history of HIV, hypertension, obesity coming in with diffuse body aches, fever. This started yesterday. She also has a nonproductive cough.  First dose of antibiotics already given.  Goal of Therapy:  Vancomycin trough level 15-20 mcg/ml  Zosyn based on renal function   Plan:  Measure antibiotic drug levels at steady state Follow up culture results Vancomycin 1250mg  iv q8hr  Zosyn 3.375g IV Q8H infused over 4hrs.   Darlina GuysGrimsley Jr, Jacquenette ShoneJulian Crowford 07/02/2014,2:56 AM

## 2014-07-02 NOTE — H&P (Signed)
Hospitalist Admission History and Physical  Patient name: Brenda Tyler Medical record number: [id number] Date of birth: [birth year] Age: 29 y.o. Gender: female  Primary Care Provider: No PCP Per Patient  Chief Complaint: sepsis, UTI History of Present Illness:This is a 29 y.o. year old female with significant past medical history of HIV last CD4 count 190, non compliant w/ HAART tx, obesity presenting with sepsis, UTI. Pt reports systemic malaise, fevers, chills, nausea, abd pain. Pt was seen earlier in the week with sxs of abd pain. Had u/s that showed biliary sludge w/o obstruction-normal LFTs. Also w/ UTI. Pt states that she has not yet taken med for UTI. Also started taking HAART med yesterday. States that she has had medication for over a month. States that she began developing malaise later in the day from taking medication.  Presents to ER T 103, HR 90s-130s, resp 10s, BP 100s-130s, Satting 95%. WBC 6.7, hbg 11.8, K 3.5, Cr 0.99. Head CT WNL. CXR negative for PNA. Abd u/s 1/6 with Cholelithiasis with single stone and sludge in the gallbladder. No additional findings to suggest cholecystitis. Started on vanc and zosyn empirically. Pancultured.   Assessment and Plan: TAREN DYMEK is a 29 y.o. year old female presenting with sepsis, UTI   Active Problems:   Sepsis   1- Sepsis -likely secondary to urinary source -lactate WNL -pan culture -CXR WNL -reassuring exam  -noted biliary disease-mild RUQ pain on exam  -LFTs-tbili WNL  -discussed overall plan of care w/ PCCM  -vanc and zosyn  -follow   2- HIV -noncompliant w/ medication regimen  -last CD4 count 04/2014 190 -cont bactrim -cont HAART tx -ID consult in am   FEN/GI: regular diet  Prophylaxis: sub q heparin  Disposition: pending further evaluation  Code Status:Full code    Patient Active Problem List   Diagnosis Date Noted  . Sepsis 07/02/2014  . HIV disease 04/23/2014  . General counseling for prescription  of oral contraceptives 06/11/2013  . 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 Obesity 12/22/2012  . HGSIL on Pap smear of cervix 12/22/2012   Past Medical History: Past Medical History  Diagnosis Date  . HSV (herpes simplex virus) infection   . HIV (human immunodeficiency virus infection)   . Hypertension   . Obesity     Past Surgical History: Past Surgical History  Procedure Laterality Date  . Incision and drainage abscess      on abdomen and buttocks    Social History: History   Social History  . Marital Status: Single    Spouse Name: N/A    Number of Children: N/A  . Years of Education: N/A   Social History Main Topics  . Smoking status: Never Smoker   . Smokeless tobacco: Never Used  . Alcohol Use: None     Comment: occasional  . Drug Use: No     Comment: pt states no drugs  . Sexual Activity: Yes    Birth Control/ Protection: None   Other Topics Concern  . None   Social History Narrative    Family History: Family History  Problem Relation Age of Onset  . Hypertension Maternal Grandmother   . Diabetes Maternal Grandmother   . Heart disease Maternal Grandmother   . Cancer Maternal Grandmother   . Hypertension Paternal Grandmother   . Diabetes Paternal Grandmother   . Heart disease Paternal Grandmother   . Heart disease Mother     Allergies: Allergies  Allergen Reactions  . Dilaudid [Hydromorphone Hcl] Other (  See Comments)    Makes her dizzy    Current Facility-Administered Medications  Medication Dose Route Frequency Provider Last Rate Last Dose  . 0.9 %  sodium chloride infusion   Intravenous Continuous Doree AlbeeSteven Fawn Desrocher, MD      . heparin injection 5,000 Units  5,000 Units Subcutaneous 3 times per day Doree AlbeeSteven Mcdaniel Ohms, MD      . NON FORMULARY 1 tablet  1 tablet Oral Q breakfast Doree AlbeeSteven Roschelle Calandra, MD      . ondansetron [facility](ZOFRAN) tablet 4 mg  4 mg Oral Q6H PRN Doree AlbeeSteven Nazire Fruth, MD       Or  . ondansetron [facility](ZOFRAN) injection 4 mg  4 mg Intravenous Q6H PRN Doree AlbeeSteven  [place], MD      . sulfamethoxazole-trimethoprim (BACTRIM DS) 800-160 MG per tablet 1 tablet  1 tablet Oral Once per day on Mon Wed Fri Doree AlbeeSteven Mabrey Howland, MD       Current Outpatient Prescriptions  Medication Sig Dispense Refill  . albuterol (PROVENTIL HFA;VENTOLIN HFA) 108 (90 BASE) MCG/ACT inhaler Inhale 1-2 puffs into the lungs every 6 (six) hours as needed for wheezing or shortness of breath. 1 Inhaler 0  . Cetirizine HCl 10 MG CAPS Take 1 capsule (10 mg total) by mouth at bedtime as needed (congestion). 30 capsule 0  . elvitegravir-cobicistat-emtricitabine-tenofovir (STRIBILD) 150-150-200-300 MG TABS tablet Take 1 tablet by mouth daily with breakfast. 90 tablet 3  . HYDROcodone-acetaminophen (NORCO/VICODIN) 5-325 MG per tablet Take 1-2 tablets by mouth every 6 (six) hours as needed for moderate pain.    Marland Kitchen. ibuprofen (ADVIL,MOTRIN) 800 MG tablet Take 1 tablet (800 mg total) by mouth 3 (three) times daily. 21 tablet 0  . promethazine (PHENERGAN) 25 MG tablet Take 1 tablet (25 mg total) by mouth every 6 (six) hours as needed for nausea or vomiting. 12 tablet 0  . ranitidine (ZANTAC) 150 MG tablet Take 1 tablet (150 mg total) by mouth 2 (two) times daily. 60 tablet 0  . chlorpheniramine-HYDROcodone (TUSSIONEX PENNKINETIC ER) 10-8 MG/5ML LQCR Take 5 mLs by mouth every 12 (twelve) hours as needed for cough. (Patient not taking: Reported on 06/29/2014) 115 mL 0  . oxyCODONE-acetaminophen (PERCOCET/ROXICET) 5-325 MG per tablet Take 1-2 tablets by mouth every 8 (eight) hours as needed for moderate pain or severe pain. (Patient not taking: Reported on 07/01/2014) 15 tablet 0  . sulfamethoxazole-trimethoprim (BACTRIM DS) 800-160 MG per tablet Take 1 tablet by mouth 3 (three) times a week. (Patient not taking: Reported on 06/29/2014) 30 tablet 5   Review Of Systems: 12 point ROS negative except as noted above in HPI.  Physical Exam: Filed Vitals:   07/02/14 0211  BP: 109/71  Pulse: 92  Temp: 98.4 F (36.9 C)   Resp: 16    General: alert, cooperative and moderately obese HEENT: PERRLA and extra ocular movement intact Heart: S1, S2 normal, no murmur, rub or gallop, regular rate and rhythm Lungs: clear to auscultation, no wheezes or rales and unlabored breathing Abdomen: obese abdomen, + bowel sounds, mild RUQ TTP  Extremities: extremities normal, atraumatic, no cyanosis or edema Skin:no rashes, no ecchymoses Neurology: normal without focal findings  Labs and Imaging: Lab Results  Component Value Date/Time   NA 134* 07/01/2014 11:33 PM   K 3.5 07/01/2014 11:33 PM   CL 103 07/01/2014 11:33 PM   CO2 22 07/01/2014 11:33 PM   BUN 11 07/01/2014 11:33 PM   CREATININE 0.99 07/01/2014 11:33 PM   CREATININE 0.87 04/06/2014 05:24 PM   GLUCOSE 143* 07/01/2014  11:33 PM   Lab Results  Component Value Date   WBC 6.7 07/01/2014   HGB 11.8* 07/01/2014   HCT 37.0 07/01/2014   MCV 87.9 07/01/2014   PLT 219 07/01/2014    Ct Head W Wo Contrast  07/02/2014   CLINICAL DATA:  Fever, headache, nausea and vomiting after beginning new HIV medication.  EXAM: CT HEAD WITHOUT AND WITH CONTRAST  TECHNIQUE: Contiguous axial images were obtained from the base of the skull through the vertex without and with intravenous contrast  CONTRAST:  OMNIPAQUE IOHEXOL 300 MG/ML  SOLN  COMPARISON:  None.  FINDINGS: The ventricles and sulci are normal. No intraparenchymal hemorrhage, mass effect nor midline shift. No acute large vascular territory infarcts. Cerebellar tonsils at but not below the foramen magnum.  No abnormal extra-axial fluid collections. Basal cisterns are patent.  No skull fracture. The included ocular globes and orbital contents are non-suspicious. The mastoid aircells and included paranasal sinuses are well-aerated. Fullness of the nasopharyngeal soft tissues can be seen in immunocompromised states.  IMPRESSION: No acute intracranial process; normal noncontrast CT of the head   Electronically Signed   By:  Awilda Metro   On: 07/02/2014 01:59   Dg Chest Port 1 View  07/02/2014   CLINICAL DATA:  Fever.  Generalized body aches and vomiting.  EXAM: PORTABLE CHEST - 1 VIEW  COMPARISON:  06/09/2014  FINDINGS: Lung volumes are low. Cardiomediastinal contours are normal. There are prominent bronchovascular markings, may reflect bronchitis versus accentuated by low lung volumes. No confluent airspace disease. No pleural effusion or pneumothorax. No acute osseous abnormality is seen.  IMPRESSION: Hypoventilatory chest. Prominent bronchovascular markings, may reflect bronchitis versus related to low lung volumes. No consolidation to suggest pneumonia.   Electronically Signed   By: Rubye Oaks M.D.   On: 07/02/2014 00:02           Doree Albee MD  Pager: [phone]

## 2014-07-03 DIAGNOSIS — B001 Herpesviral vesicular dermatitis: Secondary | ICD-10-CM | POA: Diagnosis present

## 2014-07-03 LAB — COMPREHENSIVE METABOLIC PANEL
ALT: 20 U/L (ref 0–35)
ANION GAP: 5 (ref 5–15)
AST: 24 U/L (ref 0–37)
Albumin: 2.8 g/dL — ABNORMAL LOW (ref 3.5–5.2)
Alkaline Phosphatase: 72 U/L (ref 39–117)
BILIRUBIN TOTAL: 0.4 mg/dL (ref 0.3–1.2)
BUN: 8 mg/dL (ref 6–23)
CALCIUM: 8.3 mg/dL — AB (ref 8.4–10.5)
CHLORIDE: 106 meq/L (ref 96–112)
CO2: 25 mmol/L (ref 19–32)
CREATININE: 0.82 mg/dL (ref 0.50–1.10)
GFR calc Af Amer: >90 mL/min (ref >90)
GFR calc non Af Amer: >90 mL/min (ref >90)
GLUCOSE: 97 mg/dL (ref 70–99)
POTASSIUM: 4.2 mmol/L (ref 3.5–5.1)
SODIUM: 136 mmol/L (ref 135–145)
Total Protein: 6.8 g/dL (ref 6.0–8.3)

## 2014-07-03 LAB — CBC WITH DIFFERENTIAL/PLATELET
BASOS ABS: 0 10*3/uL (ref 0.0–0.1)
Basophils Relative: 0 % (ref 0–1)
EOS ABS: 0.1 10*3/uL (ref 0.0–0.7)
Eosinophils Relative: 4 % (ref 0–5)
HCT: 31.2 % — ABNORMAL LOW (ref 36.0–46.0)
Hemoglobin: 10.1 g/dL — ABNORMAL LOW (ref 12.0–15.0)
LYMPHS PCT: 33 % (ref 12–46)
Lymphs Abs: 1.1 10*3/uL (ref 0.7–4.0)
MCH: 28.1 pg (ref 26.0–34.0)
MCHC: 32.4 g/dL (ref 30.0–36.0)
MCV: 86.9 fL (ref 78.0–100.0)
Monocytes Absolute: 0.7 10*3/uL (ref 0.1–1.0)
Monocytes Relative: 21 % — ABNORMAL HIGH (ref 3–12)
Neutro Abs: 1.4 10*3/uL — ABNORMAL LOW (ref 1.7–7.7)
Neutrophils Relative %: 42 % — ABNORMAL LOW (ref 43–77)
PLATELETS: 191 10*3/uL (ref 150–400)
RBC: 3.59 MIL/uL — AB (ref 3.87–5.11)
RDW: 13.2 % (ref 11.5–15.5)
WBC: 3.4 10*3/uL — ABNORMAL LOW (ref 4.0–10.5)

## 2014-07-03 LAB — HIV-1 RNA QUANT-NO REFLEX-BLD
HIV 1 RNA Quant: 8595 copies/mL — ABNORMAL HIGH (ref <20)
HIV-1 RNA Quant, Log: 3.93 {Log} — ABNORMAL HIGH (ref <1.30)

## 2014-07-03 LAB — HIV-1 RNA ULTRAQUANT REFLEX TO GENTYP+
HIV 1 RNA QUANT: 6119 {copies}/mL — AB (ref <20)
HIV-1 RNA QUANT, LOG: 3.79 {Log} — AB (ref <1.30)

## 2014-07-03 MED ORDER — VALACYCLOVIR HCL 500 MG PO TABS
1000.0000 mg | ORAL_TABLET | Freq: Three times a day (TID) | ORAL | Status: DC
  Administered 2014-07-03 – 2014-07-05 (×6): 1000 mg via ORAL
  Filled 2014-07-03 (×9): qty 2

## 2014-07-03 NOTE — Progress Notes (Signed)
[facility] PROGRESS NOTE   Brenda Tyler WUJ:[id number] DOB: 07/31/1985 DOA: 07/01/2014 PCP: No PCP Per Patient  HPI/Subjective: Feels better overall, complains about chills.  Assessment/Plan: Principal Problem:   Sepsis Active Problems:   HGSIL on Pap smear of cervix   HIV disease   Nausea without vomiting   UTI (lower urinary tract infection)    Sepsis Patient presented to the hospital with temp of 103 and pulse of 134 in the presence of infection. Blood and urine cultures obtained. Started on vancomycin and Zosyn initially, vancomycin discontinued. Sepsis is likely secondary to UTI.  UTI Patient presents with sepsis symptoms, urinalysis consistent with UTI. Started on vancomycin and Zosyn initially, continue Zosyn only. Urine and blood cultures obtained, just antibiotics according to the culture results.  AIDS/HIV disease CD4 cell count is 101/8/16 Patient is on Genvoya, continue HAART. As a courtesy I did notify Dr. Orvan Falconerampbell, recommended to recheck her CD4 and viral loads.  Herpes labialis Herpetic lesions in the left angle of the mouth, no evidence of any lesions inside the mouth. Patient denies any pain with swallowing or in the back of the throat to suggest herpetic viral esophagitis. Start on Valtrex.  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  Code Status: Full Code Family Communication: Plan discussed with the patient. Disposition Plan: Remains inpatient, transfer out of stepdown.   Consultants:  None  Procedures:  None  Antibiotics:  Zosyn.  Vancomycin D/C on 1/8   Objective: Filed Vitals:   07/03/14 0628  BP: 102/69  Pulse: 85  Temp: 97.9 F (36.6 C)  Resp: 20    Intake/Output Summary (Last 24 hours) at 07/03/14 1234 Last data filed at 07/03/14 0900  Gross per 24 hour  Intake    620 ml  Output    650 ml  Net    -30 ml   Filed Weights   07/02/14  0035 07/02/14 0400  Weight: 127.007 kg (280 lb) 129.7 kg (285 lb 15 oz)    Exam: General: Alert and awake, oriented x3, not in any acute distress. HEENT: anicteric sclera, pupils reactive to light and accommodation, EOMI CVS: S1-S2 clear, no murmur rubs or gallops Chest: clear to auscultation bilaterally, no wheezing, rales or rhonchi Abdomen: soft nontender, nondistended, normal bowel sounds, no organomegaly Extremities: no cyanosis, clubbing or edema noted bilaterally Neuro: Cranial nerves II-XII intact, no focal neurological deficits  Data Reviewed: Basic Metabolic Panel:  Recent Labs Lab 06/29/14 2331 07/01/14 2333 07/02/14 0314 07/02/14 0530 07/03/14 0536  NA 140 134*  --  137 136  K 3.2* 3.5  --  3.2* 4.2  CL 108 103  --  108 106  CO2 27 22  --  22 25  GLUCOSE 94 143*  --  101* 97  BUN 15 11  --  10 8  CREATININE 0.72 0.99 0.86 0.82 0.82  CALCIUM 9.0 8.6  --  7.7* 8.3*   Liver Function Tests:  Recent Labs Lab 06/29/14 2331 07/01/14 2333 07/02/14 0530 07/03/14 0536  AST 22 25 19 24   ALT 18 22 18 20   ALKPHOS 106 89 75 72  BILITOT 0.8 0.7 0.8 0.4  PROT 8.4* 8.4* 6.8 6.8  ALBUMIN 4.0 3.6 2.9* 2.8*    Recent Labs Lab 06/29/14 2331  LIPASE 46   No results for input(s): AMMONIA in the last 168 hours. CBC:  Recent Labs Lab 06/29/14 2331 07/01/14 2333 07/02/14 [id number] 07/02/14 0530 07/03/14 [id number]  WBC 5.9 6.7 4.4 4.5 3.4*  NEUTROABS 4.1 4.5  --  3.0 1.4*  HGB 11.7* 11.8* 10.6* 10.2* 10.1*  HCT 35.7* 37.0 32.6* 31.4* 31.2*  MCV 86.7 87.9 87.2 87.5 86.9  PLT 230 219 187 174 191   Cardiac Enzymes:  Recent Labs Lab 07/02/14 07/02/14 0314  CKTOTAL 79  --   TROPONINI  --  <0.03   BNP (last 3 results) No results for input(s): PROBNP in the last 8760 hours. CBG: No results for input(s): GLUCAP in the last 168 hours.  Micro No results found for this or any previous visit (from the past 240 hour(s)).   Studies: Ct Head W Wo Contrast  07/02/2014    CLINICAL DATA:  Fever, headache, nausea and vomiting after beginning new HIV medication.  EXAM: CT HEAD WITHOUT AND WITH CONTRAST  TECHNIQUE: Contiguous axial images were obtained from the base of the skull through the vertex without and with intravenous contrast  CONTRAST:  OMNIPAQUE IOHEXOL 300 MG/ML  SOLN  COMPARISON:  None.  FINDINGS: The ventricles and sulci are normal. No intraparenchymal hemorrhage, mass effect nor midline shift. No acute large vascular territory infarcts. Cerebellar tonsils at but not below the foramen magnum.  No abnormal extra-axial fluid collections. Basal cisterns are patent.  No skull fracture. The included ocular globes and orbital contents are non-suspicious. The mastoid aircells and included paranasal sinuses are well-aerated. Fullness of the nasopharyngeal soft tissues can be seen in immunocompromised states.  IMPRESSION: No acute intracranial process; normal noncontrast CT of the head   Electronically Signed   By: Awilda Metro   On: 07/02/2014 01:59   Dg Chest Port 1 View  07/02/2014   CLINICAL DATA:  Fever.  Generalized body aches and vomiting.  EXAM: PORTABLE CHEST - 1 VIEW  COMPARISON:  06/09/2014  FINDINGS: Lung volumes are low. Cardiomediastinal contours are normal. There are prominent bronchovascular markings, may reflect bronchitis versus accentuated by low lung volumes. No confluent airspace disease. No pleural effusion or pneumothorax. No acute osseous abnormality is seen.  IMPRESSION: Hypoventilatory chest. Prominent bronchovascular markings, may reflect bronchitis versus related to low lung volumes. No consolidation to suggest pneumonia.   Electronically Signed   By: Rubye Oaks M.D.   On: 07/02/2014 00:02    Scheduled Meds: . acyclovir ointment   Topical 5 X Daily  . elvitegravir-cobicistat-emtricitabine-tenofovir  1 tablet Oral Q breakfast  . heparin  5,000 Units Subcutaneous 3 times per day  . piperacillin-tazobactam (ZOSYN)  IV  3.375 g  Intravenous 3 times per day  . sulfamethoxazole-trimethoprim  1 tablet Oral Once per day on Mon Wed Fri   Continuous Infusions: . sodium chloride 100 mL/hr at 07/02/14 1441       Time spent: 35 minutes    [facility] A  [facility] Pager [phone] If 7PM-7AM, please contact night-coverage at [email or web address], password [facility] 07/03/2014, 12:34 PM  LOS: 2 days

## 2014-07-04 DIAGNOSIS — B001 Herpesviral vesicular dermatitis: Secondary | ICD-10-CM

## 2014-07-04 LAB — URINE CULTURE: Colony Count: >=100000

## 2014-07-04 LAB — CBC WITH DIFFERENTIAL/PLATELET
BASOS ABS: 0 10*3/uL (ref 0.0–0.1)
BASOS PCT: 0 % (ref 0–1)
EOS ABS: 0.2 10*3/uL (ref 0.0–0.7)
Eosinophils Relative: 5 % (ref 0–5)
HEMATOCRIT: 31.2 % — AB (ref 36.0–46.0)
Hemoglobin: 10.1 g/dL — ABNORMAL LOW (ref 12.0–15.0)
LYMPHS ABS: 1.1 10*3/uL (ref 0.7–4.0)
Lymphocytes Relative: 35 % (ref 12–46)
MCH: 27.8 pg (ref 26.0–34.0)
MCHC: 32.4 g/dL (ref 30.0–36.0)
MCV: 86 fL (ref 78.0–100.0)
Monocytes Absolute: 0.6 10*3/uL (ref 0.1–1.0)
Monocytes Relative: 18 % — ABNORMAL HIGH (ref 3–12)
NEUTROS ABS: 1.3 10*3/uL — AB (ref 1.7–7.7)
Neutrophils Relative %: 42 % — ABNORMAL LOW (ref 43–77)
Platelets: 219 10*3/uL (ref 150–400)
RBC: 3.63 MIL/uL — ABNORMAL LOW (ref 3.87–5.11)
RDW: 13.2 % (ref 11.5–15.5)
WBC: 3.1 10*3/uL — AB (ref 4.0–10.5)

## 2014-07-04 LAB — COMPREHENSIVE METABOLIC PANEL
ALT: 19 U/L (ref 0–35)
AST: 19 U/L (ref 0–37)
Albumin: 2.9 g/dL — ABNORMAL LOW (ref 3.5–5.2)
Alkaline Phosphatase: 71 U/L (ref 39–117)
Anion gap: 6 (ref 5–15)
BUN: 6 mg/dL (ref 6–23)
CO2: 24 mmol/L (ref 19–32)
CREATININE: 0.83 mg/dL (ref 0.50–1.10)
Calcium: 8.3 mg/dL — ABNORMAL LOW (ref 8.4–10.5)
Chloride: 102 mEq/L (ref 96–112)
GFR calc non Af Amer: >90 mL/min (ref >90)
GLUCOSE: 112 mg/dL — AB (ref 70–99)
Potassium: 3.2 mmol/L — ABNORMAL LOW (ref 3.5–5.1)
Sodium: 132 mmol/L — ABNORMAL LOW (ref 135–145)
Total Bilirubin: 0.5 mg/dL (ref 0.3–1.2)
Total Protein: 7.2 g/dL (ref 6.0–8.3)

## 2014-07-04 MED ORDER — POTASSIUM CHLORIDE CRYS ER 20 MEQ PO TBCR
40.0000 meq | EXTENDED_RELEASE_TABLET | Freq: Four times a day (QID) | ORAL | Status: AC
Start: 2014-07-04 — End: 2014-07-04
  Administered 2014-07-04 (×2): 40 meq via ORAL
  Filled 2014-07-04 (×2): qty 2

## 2014-07-04 NOTE — Progress Notes (Signed)
[facility] PROGRESS NOTE   Brenda Tyler ZOX:[id number] DOB: Dec 25, 1985 DOA: 07/01/2014 PCP: No PCP Per Patient  HPI/Subjective: Denies complaints, her herpetic blisters started to crust  Assessment/Plan: Principal Problem:   Sepsis Active Problems:   HGSIL on Pap smear of cervix   HIV disease   Nausea without vomiting   UTI (lower urinary tract infection)   Herpes labialis    Sepsis Patient presented to the hospital with temp of 103 and pulse of 134 in the presence of infection. Blood and urine cultures obtained. Started on vancomycin and Zosyn initially, vancomycin discontinued. Sepsis is likely secondary to UTI.  UTI Patient presents with sepsis symptoms, urinalysis consistent with UTI. Started on vancomycin and Zosyn initially, continue Zosyn only. Escherichia coli UTI, susceptibility pending, continue Zosyn for now.  AIDS/HIV disease CD4 cell count is 101/8/16 Patient is on Genvoya, continue HAART. As a courtesy I did notify Dr. Orvan Falconer, recommended to recheck her CD4 and viral loads.  Herpes labialis Herpetic lesions in the left angle of the mouth, no evidence of any lesions inside the mouth. Patient denies any pain with swallowing or in the back of the throat to suggest herpetic viral esophagitis. Started on Valtrex.  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  Code Status: Full Code Family Communication: Plan discussed with the patient. Disposition Plan: Remains inpatient, transfer out of stepdown.   Consultants:  None  Procedures:  None  Antibiotics:  Zosyn.  Vancomycin D/C on 1/8   Objective: Filed Vitals:   07/04/14 0534  BP: 97/50  Pulse: 78  Temp: 98.5 F (36.9 C)  Resp: 18    Intake/Output Summary (Last 24 hours) at 07/04/14 1250 Last data filed at 07/03/14 1804  Gross per 24 hour  Intake   1000 ml  Output      0 ml  Net   1000 ml   Filed  Weights   07/02/14 0035 07/02/14 0400  Weight: 127.007 kg (280 lb) 129.7 kg (285 lb 15 oz)    Exam: General: Alert and awake, oriented x3, not in any acute distress. HEENT: anicteric sclera, pupils reactive to light and accommodation, EOMI CVS: S1-S2 clear, no murmur rubs or gallops Chest: clear to auscultation bilaterally, no wheezing, rales or rhonchi Abdomen: soft nontender, nondistended, normal bowel sounds, no organomegaly Extremities: no cyanosis, clubbing or edema noted bilaterally Neuro: Cranial nerves II-XII intact, no focal neurological deficits  Data Reviewed: Basic Metabolic Panel:  Recent Labs Lab 06/29/14 2331 07/01/14 2333 07/02/14 0314 07/02/14 0530 07/03/14 0536 07/04/14 0524  NA 140 134*  --  137 136 132*  K 3.2* 3.5  --  3.2* 4.2 3.2*  CL 108 103  --  108 106 102  CO2 27 22  --  GLUCOSE 94 143*  --  101* 97 112*  BUN 15 11  --  CREATININE 0.72 0.99 0.86 0.82 0.82 0.83  CALCIUM 9.0 8.6  --  7.7* 8.3* 8.3*   Liver Function Tests:  Recent Labs Lab 06/29/14 2331 07/01/14 2333 07/02/14 0530 07/03/14 0536 07/04/14 0524  AST ALT ALKPHOS 106 89 75 72 71  BILITOT 0.8 0.7 0.8 0.4 0.5  PROT 8.4* 8.4* 6.8 6.8 7.2  ALBUMIN 4.0 3.6 2.9* 2.8* 2.9*    Recent Labs Lab 06/29/14 2331  LIPASE 46   No results for input(s): AMMONIA  in the last 168 hours. CBC:  Recent Labs Lab 06/29/14 2331 07/01/14 2333 07/02/14 0314 07/02/14 0530 07/03/14 0536 07/04/14 0524  WBC 5.9 6.7 4.4 4.5 3.4* 3.1*  NEUTROABS 4.1 4.5  --  3.0 1.4* 1.3*  HGB 11.7* 11.8* 10.6* 10.2* 10.1* 10.1*  HCT 35.7* 37.0 32.6* 31.4* 31.2* 31.2*  MCV 86.7 87.9 87.2 87.5 86.9 86.0  PLT 230 219 187 174 191 219   Cardiac Enzymes:  Recent Labs Lab 07/02/14 07/02/14 0314  CKTOTAL 79  --   TROPONINI  --  <0.03   BNP (last 3 results) No results for input(s): PROBNP in the last 8760 hours. CBG: No results for input(s): GLUCAP in the  last 168 hours.  Micro Recent Results (from the past 240 hour(s))  Blood Culture (routine x 2)     Status: None (Preliminary result)   Collection Time: 07/02/14 12:35 AM  Result Value Ref Range Status   Specimen Description BLOOD RIGHT HAND  Final   Special Requests BOTTLES DRAWN AEROBIC AND ANAEROBIC 5ML  Final   Culture   Final           BLOOD CULTURE RECEIVED NO GROWTH TO DATE CULTURE WILL BE HELD FOR 5 DAYS BEFORE ISSUING A FINAL NEGATIVE REPORT [facility]    Report Status PENDING  Incomplete  Blood Culture (routine x 2)     Status: None (Preliminary result)   Collection Time: 07/02/14 12:35 AM  Result Value Ref Range Status   Specimen Description BLOOD BLOOD RIGHT FOREARM  Final   Special Requests BOTTLES DRAWN AEROBIC AND ANAEROBIC 5ML  Final   Culture   Final           BLOOD CULTURE RECEIVED NO GROWTH TO DATE CULTURE WILL BE HELD FOR 5 DAYS BEFORE ISSUING A FINAL NEGATIVE REPORT Performed at [facility]    Report Status PENDING  Incomplete  Urine culture     Status: None (Preliminary result)   Collection Time: 07/02/14  1:13 AM  Result Value Ref Range Status   Specimen Description URINE, CLEAN CATCH  Final   Special Requests NONE  Final   Colony Count   Final    >=100,000 COLONIES/ML Performed at [facility]    Culture   Final    ESCHERICHIA COLI Performed at [facility]    Report Status PENDING  Incomplete     Studies: No results found.  Scheduled Meds: . acyclovir ointment   Topical 5 X Daily  . elvitegravir-cobicistat-emtricitabine-tenofovir  1 tablet Oral Q breakfast  . heparin  5,000 Units Subcutaneous 3 times per day  . piperacillin-tazobactam (ZOSYN)  IV  3.375 g Intravenous 3 times per day  . potassium chloride  40 mEq Oral Q6H  . sulfamethoxazole-trimethoprim  1 tablet Oral Once per day on Mon Wed Fri  . valACYclovir  1,000 mg Oral TID   Continuous Infusions:       Time spent: 35  minutes    [facility] IncELMA[facility],Angell Honse A  [facility] Pager [phone] If 7PM-7AM, please contact night-coverage at [email or web address], password [email or web address] 07/04/2014, 12:50 PM  LOS: 3 days

## 2014-07-05 DIAGNOSIS — B370 Candidal stomatitis: Secondary | ICD-10-CM

## 2014-07-05 HISTORY — DX: Candidal stomatitis: B37.0

## 2014-07-05 LAB — COMPREHENSIVE METABOLIC PANEL
ALBUMIN: 3.2 g/dL — AB (ref 3.5–5.2)
ALT: 18 U/L (ref 0–35)
ANION GAP: 7 (ref 5–15)
AST: 18 U/L (ref 0–37)
Alkaline Phosphatase: 73 U/L (ref 39–117)
BUN: 6 mg/dL (ref 6–23)
CALCIUM: 9.1 mg/dL (ref 8.4–10.5)
CO2: 26 mmol/L (ref 19–32)
CREATININE: 0.87 mg/dL (ref 0.50–1.10)
Chloride: 105 mEq/L (ref 96–112)
GFR calc non Af Amer: 89 mL/min — ABNORMAL LOW (ref >90)
GLUCOSE: 105 mg/dL — AB (ref 70–99)
POTASSIUM: 3.9 mmol/L (ref 3.5–5.1)
Sodium: 138 mmol/L (ref 135–145)
Total Bilirubin: 0.3 mg/dL (ref 0.3–1.2)
Total Protein: 7.9 g/dL (ref 6.0–8.3)

## 2014-07-05 LAB — CBC WITH DIFFERENTIAL/PLATELET
Basophils Absolute: 0 10*3/uL (ref 0.0–0.1)
Basophils Relative: 0 % (ref 0–1)
EOS PCT: 5 % (ref 0–5)
Eosinophils Absolute: 0.2 10*3/uL (ref 0.0–0.7)
HCT: 32.4 % — ABNORMAL LOW (ref 36.0–46.0)
HEMOGLOBIN: 10.7 g/dL — AB (ref 12.0–15.0)
Lymphocytes Relative: 28 % (ref 12–46)
Lymphs Abs: 0.9 10*3/uL (ref 0.7–4.0)
MCH: 28.6 pg (ref 26.0–34.0)
MCHC: 33 g/dL (ref 30.0–36.0)
MCV: 86.6 fL (ref 78.0–100.0)
MONOS PCT: 15 % — AB (ref 3–12)
Monocytes Absolute: 0.5 10*3/uL (ref 0.1–1.0)
NEUTROS ABS: 1.6 10*3/uL — AB (ref 1.7–7.7)
Neutrophils Relative %: 51 % (ref 43–77)
PLATELETS: 287 10*3/uL (ref 150–400)
RBC: 3.74 MIL/uL — ABNORMAL LOW (ref 3.87–5.11)
RDW: 13.3 % (ref 11.5–15.5)
WBC: 3.2 10*3/uL — AB (ref 4.0–10.5)

## 2014-07-05 MED ORDER — FLUCONAZOLE 100 MG PO TABS: 100.0000 mg | ORAL_TABLET | Freq: Every day | ORAL | Status: DC

## 2014-07-05 MED ORDER — ACYCLOVIR 5 % EX OINT
TOPICAL_OINTMENT | Freq: Every day | CUTANEOUS | Status: DC
Start: 2014-07-05 — End: 2016-08-25

## 2014-07-05 MED ORDER — CIPROFLOXACIN HCL 500 MG PO TABS: 500.0000 mg | ORAL_TABLET | Freq: Two times a day (BID) | ORAL | Status: DC

## 2014-07-05 NOTE — Discharge Summary (Signed)
Physician Discharge Summary  DVORA BUITRON ZOX:[id number] DOB: [birth year] DOA: 07/01/2014  PCP: No PCP Per Patient  Admit date: 07/01/2014 Discharge date: 07/05/2014  Time spent: 40 minutes  Recommendations for Outpatient Follow-up:  1. Follow-up with Dr. Ninetta Lights within 2 weeks.  Discharge Diagnoses:  Principal Problem:   Sepsis Active Problems:   HGSIL on Pap smear of cervix   HIV disease   Nausea without vomiting   UTI (lower urinary tract infection)   Herpes labialis   Oropharyngeal candidiasis   Discharge Condition: Stable  Diet recommendation: Heart healthy  Filed Weights   07/02/14 0035 07/02/14 0400  Weight: 127.007 kg (280 lb) 129.7 kg (285 lb 15 oz)    History of present illness:  This is a 29 y.o. year old female with significant past medical history of HIV last CD4 count 190, non compliant w/ HAART tx, obesity presenting with sepsis, UTI. Pt reports systemic malaise, fevers, chills, nausea, abd pain. Pt was seen earlier in the week with sxs of abd pain. Had u/s that showed biliary sludge w/o obstruction-normal LFTs. Also w/ UTI. Pt states that she has not yet taken med for UTI. Also started taking HAART med yesterday. States that she has had medication for over a month. States that she began developing malaise later in the day from taking medication.  Presents to ER T 103, HR 90s-130s, resp 10s, BP 100s-130s, Satting 95%. WBC 6.7, hbg 11.8, K 3.5, Cr 0.99. Head CT WNL. CXR negative for PNA. Abd u/s 1/6 with Cholelithiasis with single stone and sludge in the gallbladder. No additional findings to suggest cholecystitis. Started on vanc and zosyn empirically. Pancultured.   Hospital Course:    Sepsis Patient presented to the hospital with temp of 103 and pulse of 134 in the presence of infection. Blood and urine cultures obtained. Started on vancomycin and Zosyn initially, vancomycin discontinued. Sepsis is likely secondary to UTI. Sepsis physiology resolved prior to  discharge.  Escherichia coli UTI Patient presents with sepsis symptoms, urinalysis consistent with UTI. Started on vancomycin and Zosyn initially, continue Zosyn only. Escherichia coli is pansensitive (except for ampicillin), patient discharged in Cipro for 5 more days.  AIDS/HIV disease CD4 cell count is 100 on 07/02/14 Patient is on Genvoya, continue HAART. As a courtesy I did notify Dr. Orvan Falconer, he recommended to recheck her CD4 and viral loads.  Herpes labialis Herpetic lesions in the left angle of the mouth, no evidence of any lesions inside the mouth. Patient denies any pain with swallowing or in the back of the throat to suggest herpetic viral esophagitis. Given Zovirax cream, received 1 day of high dose Valtrex, herpetic blisters already crusted.  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  Oropharyngeal candidiasis On day of discharge patient complained about oral thrush. Likely secondary to broad-spectrum antibiotics and her immunocompromised state. Prescription given for Diflucan for 2 weeks.   Procedures:  None  Consultations:  None  Discharge Exam: Filed Vitals:   07/05/14 0510  BP: 96/48  Pulse: 78  Temp: 98.3 F (36.8 C)  Resp: 16   General: Alert and awake, oriented x3, not in any acute distress. HEENT: anicteric sclera, pupils reactive to light and accommodation, EOMI CVS: S1-S2 clear, no murmur rubs or gallops Chest: clear to auscultation bilaterally, no wheezing, rales or rhonchi Abdomen: soft nontender, nondistended, normal bowel sounds, no organomegaly Extremities: no cyanosis, clubbing or edema noted bilaterally Neuro: Cranial nerves II-XII intact, no focal  neurological deficits  Discharge Instructions   Discharge Instructions    Diet - low sodium heart healthy    Complete by:  As directed      Increase activity slowly    Complete by:  As directed            Current Discharge Medication List    START taking these medications   Details  acyclovir ointment (ZOVIRAX) 5 % Apply topically 5 (five) times daily. Qty: 3 g, Refills: 0    ciprofloxacin (CIPRO) 500 MG tablet Take 1 tablet (500 mg total) by mouth 2 (two) times daily. Qty: 10 tablet, Refills: 0    fluconazole (DIFLUCAN) 100 MG tablet Take 1 tablet (100 mg total) by mouth daily. Qty: 14 tablet, Refills: 0      CONTINUE these medications which have NOT CHANGED   Details  albuterol (PROVENTIL HFA;VENTOLIN HFA) 108 (90 BASE) MCG/ACT inhaler Inhale 1-2 puffs into the lungs every 6 (six) hours as needed for wheezing or shortness of breath. Qty: 1 Inhaler, Refills: 0    Cetirizine HCl 10 MG CAPS Take 1 capsule (10 mg total) by mouth at bedtime as needed (congestion). Qty: 30 capsule, Refills: 0    elvitegravir-cobicistat-emtricitabine-tenofovir (STRIBILD) 150-150-200-300 MG TABS tablet Take 1 tablet by mouth daily with breakfast. Qty: 90 tablet, Refills: 3   Associated Diagnoses: HIV disease; HIV (human immunodeficiency virus infection)    HYDROcodone-acetaminophen (NORCO/VICODIN) 5-325 MG per tablet Take 1-2 tablets by mouth every 6 (six) hours as needed for moderate pain.    promethazine (PHENERGAN) 25 MG tablet Take 1 tablet (25 mg total) by mouth every 6 (six) hours as needed for nausea or vomiting. Qty: 12 tablet, Refills: 0    ranitidine (ZANTAC) 150 MG tablet Take 1 tablet (150 mg total) by mouth 2 (two) times daily. Qty: 60 tablet, Refills: 0    sulfamethoxazole-trimethoprim (BACTRIM DS) 800-160 MG per tablet Take 1 tablet by mouth 3 (three) times a week. Qty: 30 tablet, Refills: 5   Associated Diagnoses: HIV (human immunodeficiency virus infection); HIV disease      STOP taking these medications     ibuprofen (ADVIL,MOTRIN) 800 MG tablet      chlorpheniramine-HYDROcodone (TUSSIONEX PENNKINETIC ER) 10-8 MG/5ML LQCR      oxyCODONE-acetaminophen (PERCOCET/ROXICET)  5-325 MG per tablet        Allergies  Allergen Reactions  . Dilaudid [Hydromorphone Hcl] Other (See Comments)    Makes her dizzy   Follow-up Information    Follow up with Johny Sax, MD In 2 weeks.   Specialty:  Infectious Diseases   Contact information:   [address] [place] [address] [phone]        The results of significant diagnostics from this hospitalization (including imaging, microbiology, ancillary and laboratory) are listed below for reference.    Significant Diagnostic Studies: Dg Chest 2 View  06/09/2014   CLINICAL DATA:  Cough for 1 week  EXAM: CHEST  2 VIEW  COMPARISON:  12/03/2011  FINDINGS: Cardiomediastinal silhouette is stable. No acute infiltrate or pleural effusion. No pulmonary edema. Bony thorax is unremarkable.  IMPRESSION: No active cardiopulmonary disease.   Electronically Signed   By: Natasha Mead M.D.   On: 06/09/2014 22:57   Ct Head W Wo Contrast  07/02/2014   CLINICAL DATA:  Fever, headache, nausea and vomiting after beginning new HIV medication.  EXAM: CT HEAD WITHOUT AND WITH CONTRAST  TECHNIQUE: Contiguous axial images were obtained from the base of the  skull through the vertex without and with intravenous contrast  CONTRAST:  OMNIPAQUE IOHEXOL 300 MG/ML  SOLN  COMPARISON:  None.  FINDINGS: The ventricles and sulci are normal. No intraparenchymal hemorrhage, mass effect nor midline shift. No acute large vascular territory infarcts. Cerebellar tonsils at but not below the foramen magnum.  No abnormal extra-axial fluid collections. Basal cisterns are patent.  No skull fracture. The included ocular globes and orbital contents are non-suspicious. The mastoid aircells and included paranasal sinuses are well-aerated. Fullness of the nasopharyngeal soft tissues can be seen in immunocompromised states.  IMPRESSION: No acute intracranial process; normal noncontrast CT of the head   Electronically Signed   By: Awilda Metro   On:  07/02/2014 01:59   Dg Chest Port 1 View  07/02/2014   CLINICAL DATA:  Fever.  Generalized body aches and vomiting.  EXAM: PORTABLE CHEST - 1 VIEW  COMPARISON:  06/09/2014  FINDINGS: Lung volumes are low. Cardiomediastinal contours are normal. There are prominent bronchovascular markings, may reflect bronchitis versus accentuated by low lung volumes. No confluent airspace disease. No pleural effusion or pneumothorax. No acute osseous abnormality is seen.  IMPRESSION: Hypoventilatory chest. Prominent bronchovascular markings, may reflect bronchitis versus related to low lung volumes. No consolidation to suggest pneumonia.   Electronically Signed   By: Rubye Oaks M.D.   On: 07/02/2014 00:02   US Abdomen Limited Ruq  06/30/2014   CLINICAL DATA:  Right upper quadrant abdominal pain. History of cholelithiasis.  EXAM: US ABDOMEN LIMITED - RIGHT UPPER QUADRANT  COMPARISON:  None.  FINDINGS: Gallbladder:  Cholelithiasis with single stone in the dependent gallbladder measuring 8 mm diameter. Internal echoes suggests sludge. No gallbladder wall thickening or edema. Murphy's sign is negative.  Common bile duct:  Diameter: 4.6 mm, normal  Liver:  No focal lesion identified. Within normal limits in parenchymal echogenicity.  Examination is limited by patient's body habitus.  IMPRESSION: Cholelithiasis with single stone and sludge in the gallbladder. No additional findings to suggest cholecystitis.   Electronically Signed   By: Burman Nieves M.D.   On: 06/30/2014 00:18    Microbiology: Recent Results (from the past 240 hour(s))  Blood Culture (routine x 2)     Status: None (Preliminary result)   Collection Time: 07/02/14 12:35 AM  Result Value Ref Range Status   Specimen Description BLOOD RIGHT HAND  Final   Special Requests BOTTLES DRAWN AEROBIC AND ANAEROBIC  Final   Culture   Final           BLOOD CULTURE RECEIVED NO GROWTH TO DATE CULTURE WILL BE HELD FOR 5 DAYS BEFORE ISSUING A FINAL NEGATIVE  REPORT Performed at [facility]    Report Status PENDING  Incomplete  Blood Culture (routine x 2)     Status: None (Preliminary result)   Collection Time: 07/02/14 12:35 AM  Result Value Ref Range Status   Specimen Description BLOOD BLOOD RIGHT FOREARM  Final   Special Requests BOTTLES DRAWN AEROBIC AND ANAEROBIC  Final   Culture   Final           BLOOD CULTURE RECEIVED NO GROWTH TO DATE CULTURE WILL BE HELD FOR 5 DAYS BEFORE ISSUING A FINAL NEGATIVE REPORT Performed at [facility]    Report Status PENDING  Incomplete  Urine culture     Status: None   Collection Time: 07/02/14  1:13 AM  Result Value Ref Range Status   Specimen Description URINE, CLEAN CATCH  Final  Special Requests NONE  Final   Colony Count   Final    >=100,000 COLONIES/ML Performed at [facility]    Culture   Final    ESCHERICHIA COLI Performed at [facility]    Report Status 07/04/2014 FINAL  Final   Organism ID, Bacteria ESCHERICHIA COLI  Final      Susceptibility   Escherichia coli - MIC*    AMPICILLIN >=32 RESISTANT Resistant     CEFAZOLIN <=4 SENSITIVE Sensitive     CEFTRIAXONE <=1 SENSITIVE Sensitive     CIPROFLOXACIN <=0.25 SENSITIVE Sensitive     GENTAMICIN <=1 SENSITIVE Sensitive     LEVOFLOXACIN <=0.12 SENSITIVE Sensitive     NITROFURANTOIN <=16 SENSITIVE Sensitive     TOBRAMYCIN <=1 SENSITIVE Sensitive     TRIMETH/SULFA <=20 SENSITIVE Sensitive     PIP/TAZO <=4 SENSITIVE Sensitive     * ESCHERICHIA COLI     Labs: Basic Metabolic Panel:  Recent Labs Lab 07/01/14 2333 07/02/14 0314 07/02/14 0530 07/03/14 0536 07/04/14 0524 07/05/14 0510  NA 134*  --  137 136 132* 138  K 3.5  --  3.2* 4.2 3.2* 3.9  CL 103  --  108 106 102 105  CO2 22  --  22 25 24 26   GLUCOSE 143*  --  101* 97 112* 105*  BUN 11  --  10 8 6 6   CREATININE 0.99 0.86 0.82 0.82 0.83 0.87  CALCIUM 8.6  --  7.7* 8.3* 8.3* 9.1   Liver Function Tests:  Recent Labs Lab  07/01/14 2333 07/02/14 0530 07/03/14 0536 07/04/14 0524 07/05/14 0510  AST 25 19 24 19 18   ALT 22 18 20 19 18   ALKPHOS 89 75 72 71 73  BILITOT 0.7 0.8 0.4 0.5 0.3  PROT 8.4* 6.8 6.8 7.2 7.9  ALBUMIN 3.6 2.9* 2.8* 2.9* 3.2*    Recent Labs Lab 06/29/14 2331  LIPASE 46   No results for input(s): AMMONIA in the last 168 hours. CBC:  Recent Labs Lab 07/01/14 2333 07/02/14 0314 07/02/14 0530 07/03/14 0536 07/04/14 0524 07/05/14 0510  WBC 6.7 4.4 4.5 3.4* 3.1* 3.2*  NEUTROABS 4.5  --  3.0 1.4* 1.3* 1.6*  HGB 11.8* 10.6* 10.2* 10.1* 10.1* 10.7*  HCT 37.0 32.6* 31.4* 31.2* 31.2* 32.4*  MCV 87.9 87.2 87.5 86.9 86.0 86.6  PLT 219 187 174 191 219 287   Cardiac Enzymes:  Recent Labs Lab 07/02/14 07/02/14 0314  CKTOTAL 79  --   TROPONINI  --  <0.03   BNP: BNP (last 3 results) No results for input(s): PROBNP in the last 8760 hours. CBG: No results for input(s): GLUCAP in the last 168 hours.     Signed:  Dusty Raczkowski A  [facility] 07/05/2014, 11:03 AM

## 2014-07-08 ENCOUNTER — Ambulatory Visit: Payer: Medicaid Other | Admitting: Infectious Diseases

## 2014-07-08 LAB — CULTURE, BLOOD (ROUTINE X 2)
CULTURE: NO GROWTH
CULTURE: NO GROWTH

## 2014-07-12 ENCOUNTER — Ambulatory Visit: Payer: Medicaid Other | Admitting: Infectious Diseases

## 2014-07-14 LAB — HIV-1 GENOTYPR PLUS

## 2014-07-26 ENCOUNTER — Encounter: Payer: Self-pay | Admitting: Infectious Diseases

## 2014-07-26 ENCOUNTER — Ambulatory Visit (INDEPENDENT_AMBULATORY_CARE_PROVIDER_SITE_OTHER): Payer: Medicaid Other | Admitting: Infectious Diseases

## 2014-07-26 VITALS — BP 123/87 | HR 83 | Temp 97.9°F | Wt 284.0 lb

## 2014-07-26 DIAGNOSIS — R87613 High grade squamous intraepithelial lesion on cytologic smear of cervix (HGSIL): Secondary | ICD-10-CM

## 2014-07-26 DIAGNOSIS — Z79899 Other long term (current) drug therapy: Secondary | ICD-10-CM

## 2014-07-26 DIAGNOSIS — F32A Depression, unspecified: Secondary | ICD-10-CM | POA: Insufficient documentation

## 2014-07-26 DIAGNOSIS — Z113 Encounter for screening for infections with a predominantly sexual mode of transmission: Secondary | ICD-10-CM

## 2014-07-26 DIAGNOSIS — N390 Urinary tract infection, site not specified: Secondary | ICD-10-CM

## 2014-07-26 DIAGNOSIS — B20 Human immunodeficiency virus [HIV] disease: Secondary | ICD-10-CM

## 2014-07-26 DIAGNOSIS — F329 Major depressive disorder, single episode, unspecified: Secondary | ICD-10-CM | POA: Insufficient documentation

## 2014-07-26 NOTE — Assessment & Plan Note (Signed)
Will have her seen by counselor.  

## 2014-07-26 NOTE — Assessment & Plan Note (Signed)
Appears resolved despite non-adherence. Will not repeat her UCx unless symptomatic.

## 2014-07-26 NOTE — Assessment & Plan Note (Signed)
She states she is taking her ART regularly. She has multiple questions about disclosure, what stage she is at.  I attempted to aanswer these. She has gotten her flu shot.  She deferes rechecking her CD4 today until f/u.  Lastly, she lets me know that she is moving to [place] on 08-13-14.

## 2014-07-26 NOTE — Assessment & Plan Note (Signed)
She needs GYN f/u, she is not sure when this. Will make appt for her.

## 2014-07-26 NOTE — Progress Notes (Signed)
   Subjective:    Patient ID: Brenda Tyler, female    DOB: 10/18/1985, 29 y.o.   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 Previously started on bactrim and stribild.  She was in hospital 07-02-14 to 07-05-14 with temp 103 and UTI (E coli). She was d/c home with Cipro but she did not take this. States she feels better today. No dysuria or urinary d/c.  Has been feeling down, wants to see counselor.  Has been taking stribild every day.   HIV 1 RNA QUANT (copies/mL)  Date Value  07/02/2014 6119*  07/02/2014 8595*  05/10/2014 [id number]*   CD4 T CELL ABS (/uL)  Date Value  07/02/2014 100*  05/10/2014 190*  04/06/2014 190*     Review of Systems  Constitutional: Negative for fever, chills, appetite change and unexpected weight change.  Gastrointestinal: Negative for diarrhea and constipation.  Genitourinary: Negative for dysuria and difficulty urinating.  Psychiatric/Behavioral: Positive for sleep disturbance and dysphoric mood.       Objective:   Physical Exam  Constitutional: She appears well-developed and well-nourished.  HENT:  Mouth/Throat: No oropharyngeal exudate.  Eyes: EOM are normal. Pupils are equal, round, and reactive to light.  Neck: Neck supple.  Cardiovascular: Normal rate, regular rhythm and normal heart sounds.   Pulmonary/Chest: Effort normal and breath sounds normal.  Abdominal: Soft. Bowel sounds are normal. She exhibits no distension. There is no tenderness.  Lymphadenopathy:    She has no cervical adenopathy.  Psychiatric: She exhibits a depressed mood.          Assessment & Plan:

## 2014-08-27 ENCOUNTER — Telehealth: Payer: Self-pay | Admitting: *Deleted

## 2014-08-27 NOTE — Telephone Encounter (Signed)
Attempt[facility] to [facility] at 3:45 pm and her phone is not in service. Copy of appt info mailed to her home. Brenda MolaJacqueline Cockerham

## 2014-09-22 ENCOUNTER — Encounter: Payer: Self-pay | Admitting: *Deleted

## 2014-09-22 ENCOUNTER — Telehealth: Payer: Self-pay | Admitting: *Deleted

## 2014-09-22 ENCOUNTER — Ambulatory Visit: Payer: Medicaid Other | Admitting: Obstetrics & Gynecology

## 2014-09-22 NOTE — Telephone Encounter (Signed)
Attempted to contact patient concerning missed appointment, number incorrect.  Will send a letter.  Letter Sent

## 2014-10-13 ENCOUNTER — Encounter: Payer: Self-pay | Admitting: General Practice

## 2014-11-17 ENCOUNTER — Telehealth: Payer: Self-pay | Admitting: *Deleted

## 2014-11-17 NOTE — Telephone Encounter (Signed)
Pt moved to [place], needing assist with finding HIV clinic.  Pt states she has [place] Medicaid until Sept. 2016 but she is not able to receive any additional refills.  Currently out of her HIV medications.  [facility] Case Manager given the pt's information and telephone number to assist with locating HIV services.

## 2015-02-21 ENCOUNTER — Emergency Department (HOSPITAL_COMMUNITY)
Admission: EM | Admit: 2015-02-21 | Discharge: 2015-02-22 | Disposition: A | Discharge status: Home / Self Care | Payer: Medicaid Other | Attending: Emergency Medicine | Admitting: Emergency Medicine

## 2015-02-21 ENCOUNTER — Encounter (HOSPITAL_COMMUNITY): Payer: Self-pay | Admitting: Emergency Medicine

## 2015-02-21 DIAGNOSIS — R102 Pelvic and perineal pain: Secondary | ICD-10-CM

## 2015-02-21 DIAGNOSIS — Z3202 Encounter for pregnancy test, result negative: Secondary | ICD-10-CM | POA: Insufficient documentation

## 2015-02-21 DIAGNOSIS — E669 Obesity, unspecified: Secondary | ICD-10-CM | POA: Diagnosis not present

## 2015-02-21 DIAGNOSIS — N390 Urinary tract infection, site not specified: Secondary | ICD-10-CM | POA: Diagnosis not present

## 2015-02-21 DIAGNOSIS — Z79899 Other long term (current) drug therapy: Secondary | ICD-10-CM | POA: Insufficient documentation

## 2015-02-21 DIAGNOSIS — Z8619 Personal history of other infectious and parasitic diseases: Secondary | ICD-10-CM | POA: Insufficient documentation

## 2015-02-21 DIAGNOSIS — I10 Essential (primary) hypertension: Secondary | ICD-10-CM | POA: Diagnosis not present

## 2015-02-21 MED ORDER — SODIUM CHLORIDE 0.9 % IV BOLUS (SEPSIS)
1000.0000 mL | Freq: Once | INTRAVENOUS | Status: AC
  Administered 2015-02-22: 1000 mL via INTRAVENOUS

## 2015-02-21 NOTE — ED Provider Notes (Signed)
CSN: [id number]     Arrival date & time 02/21/15  2315 History   First MD Initiated Contact with Patient 02/21/15 2332     Chief Complaint  Patient presents with  . Pelvic Pain     (Consider location/radiation/quality/duration/timing/severity/associated sxs/prior Treatment) Patient is a 29 y.o. female presenting with pelvic pain. The history is provided by the patient. No language interpreter was used.  Pelvic Pain Pertinent negatives include no chest pain, fever, nausea or vomiting.  Brenda Tyler is a 29 year old female with a history of HIV, hypertension who presents for pelvic discomfort for the past week. She states she also feels a pressure in her rectum. Her last bowel movement was earlier today. She denies any fever, chills, chest pain, shortness of breath, nausea, vomiting, diarrhea, hematochezia, dysuria, hematuria, vaginal discharge, vaginal bleeding. Her last menstrual period was 5 years ago and she states she is going through early menopause. She denies taking her HIV medications in over 6 months and states she has not seen a PCP. She is unaware of her CD4 count status. She denies any treatment prior to arrival.  Past Medical History  Diagnosis Date  . HSV (herpes simplex virus) infection   . HIV (human immunodeficiency virus infection)   . Hypertension   . Obesity    Past Surgical History  Procedure Laterality Date  . Incision and drainage abscess      on abdomen and buttocks   Family History  Problem Relation Age of Onset  . Hypertension Maternal Grandmother   . Diabetes Maternal Grandmother   . Heart disease Maternal Grandmother   . Cancer Maternal Grandmother   . Hypertension Paternal Grandmother   . Diabetes Paternal Grandmother   . Heart disease Paternal Grandmother   . Heart disease Mother    Social History  Substance Use Topics  . Smoking status: Never Smoker   . Smokeless tobacco: Never Used  . Alcohol Use: 1.2 oz/week    2 Standard drinks or equivalent  per week     Comment: occasional   OB History    Gravida Para Term Preterm AB TAB SAB Ectopic Multiple Living   4 3 3  1 1    3      Review of Systems  Constitutional: Negative for fever.  Respiratory: Negative for shortness of breath.   Cardiovascular: Negative for chest pain.  Gastrointestinal: Negative for nausea, vomiting, diarrhea, constipation and blood in stool.  Genitourinary: Positive for pelvic pain. Negative for hematuria.  All other systems reviewed and are negative.     Allergies  Dilaudid  Home Medications   Prior to Admission medications   Medication Sig Start Date End Date Taking? Authorizing Provider  acyclovir ointment (ZOVIRAX) 5 % Apply topically 5 (five) times daily. Patient not taking: Reported on 07/26/2014 07/05/14   Clydia Llano, MD  albuterol (PROVENTIL HFA;VENTOLIN HFA) 108 (90 BASE) MCG/ACT inhaler Inhale 1-2 puffs into the lungs every 6 (six) hours as needed for wheezing or shortness of breath. Patient not taking: Reported on 07/26/2014 06/10/14   Terri Piedra, PA-C  Cetirizine HCl 10 MG CAPS Take 1 capsule (10 mg total) by mouth at bedtime as needed (congestion). Patient not taking: Reported on 02/21/2015 06/10/14   Terri Piedra, PA-C  elvitegravir-cobicistat-emtricitabine-tenofovir (STRIBILD) 150-150-200-300 MG TABS tablet Take 1 tablet by mouth daily with breakfast. 04/23/14   Ginnie Smart, MD  fluconazole (DIFLUCAN) 100 MG tablet Take 1 tablet (100 mg total) by mouth daily. Patient not taking: Reported on 02/21/2015 07/05/14  Clydia Llano, MD  HYDROcodone-acetaminophen (NORCO/VICODIN) 5-325 MG per tablet Take 1-2 tablets by mouth every 6 (six) hours as needed for moderate pain.    Historical Provider, MD  promethazine (PHENERGAN) 25 MG tablet Take 1 tablet (25 mg total) by mouth every 6 (six) hours as needed for nausea or vomiting. Patient not taking: Reported on 02/21/2015 06/30/14   Dahlia Client Muthersbaugh, PA-C  ranitidine (ZANTAC) 150 MG  tablet Take 1 tablet (150 mg total) by mouth 2 (two) times daily. Patient not taking: Reported on 02/21/2015 04/25/14   Gilda Crease, MD  sulfamethoxazole-trimethoprim (BACTRIM DS) 800-160 MG per tablet Take 1 tablet by mouth 3 (three) times a week. Patient not taking: Reported on 06/29/2014 04/23/14   Ginnie Smart, MD   BP 122/85 mmHg  Pulse 73  Temp(Src) 98.3 F (36.8 C) (Oral)  Resp 16  SpO2 97%  LMP 06/25/2008 Physical Exam  Constitutional: She is oriented to person, place, and time. She appears well-developed and well-nourished. No distress.  HENT:  Head: Normocephalic and atraumatic.  Eyes: Conjunctivae are normal.  Neck: Normal range of motion. Neck supple.  Cardiovascular: Normal rate, regular rhythm and normal heart sounds.   Pulmonary/Chest: Effort normal and breath sounds normal.  Abdominal: Soft. She exhibits no distension. There is tenderness. There is no rebound and no guarding.    Morbidly obese abdomen.  Genitourinary: No tenderness or bleeding in the vagina. No vaginal discharge found.  Pelvic exam: Chaperone present. No vaginal discharge or bleeding. No adnexal tenderness. Cervical os is closed. No external genitalia lesions. No pain on exam.  Musculoskeletal: Normal range of motion.  Neurological: She is alert and oriented to person, place, and time.  Skin: Skin is warm and dry.  Nursing note and vitals reviewed.   ED Course  Procedures (including critical care time) Labs Review Labs Reviewed  WET PREP, GENITAL  URINALYSIS, ROUTINE W REFLEX MICROSCOPIC (NOT AT [facility])  CBC WITH DIFFERENTIAL/PLATELET  COMPREHENSIVE METABOLIC PANEL  POC URINE PREG, ED  GC/CHLAMYDIA PROBE AMP ([place]) NOT AT [facility]    Imaging Review No results found. I have personally reviewed and evaluated these images and lab results as part of my medical decision-making.   EKG Interpretation None      MDM   Final diagnoses:  Pelvic pain in female  Patient presents  for pelvic pressure and rectal discomfort. She is well-appearing and in no acute distress. Her vitals are stable and she has no peritoneal signs. She was followed by Dr. Ninetta Lights with infectious disease 6 months ago, prior to moving to [facility] That was the last time she had her CD4 count checked and was seen by a physician. I signed this patient out to Elpidio Anis, PA-C. If labs are normal she will need follow up with her infectious disease physician and recheck of CD4.       Catha Gosselin, PA-C 02/22/15 0130  Tomasita Crumble, MD 02/22/15 [phone]

## 2015-02-21 NOTE — ED Notes (Signed)
Pt states that she has been having pelvic pain and dysuria x 1 week. Alert and oriented. Denies vaginal discharge.

## 2015-02-22 LAB — CBC WITH DIFFERENTIAL/PLATELET
Basophils Absolute: 0 10*3/uL (ref 0.0–0.1)
Basophils Relative: 0 % (ref 0–1)
EOS ABS: 0.1 10*3/uL (ref 0.0–0.7)
Eosinophils Relative: 3 % (ref 0–5)
HCT: 37.3 % (ref 36.0–46.0)
Hemoglobin: 12.1 g/dL (ref 12.0–15.0)
LYMPHS ABS: 1.7 10*3/uL (ref 0.7–4.0)
LYMPHS PCT: 39 % (ref 12–46)
MCH: 28.9 pg (ref 26.0–34.0)
MCHC: 32.4 g/dL (ref 30.0–36.0)
MCV: 89 fL (ref 78.0–100.0)
Monocytes Absolute: 0.4 10*3/uL (ref 0.1–1.0)
Monocytes Relative: 9 % (ref 3–12)
NEUTROS ABS: 2.1 10*3/uL (ref 1.7–7.7)
Neutrophils Relative %: 49 % (ref 43–77)
Platelets: 253 10*3/uL (ref 150–400)
RBC: 4.19 MIL/uL (ref 3.87–5.11)
RDW: 12.9 % (ref 11.5–15.5)
WBC: 4.3 10*3/uL (ref 4.0–10.5)

## 2015-02-22 LAB — URINALYSIS, ROUTINE W REFLEX MICROSCOPIC
Bilirubin Urine: NEGATIVE
Glucose, UA: NEGATIVE mg/dL
Hgb urine dipstick: NEGATIVE
Ketones, ur: NEGATIVE mg/dL
NITRITE: NEGATIVE
PH: 6.5 (ref 5.0–8.0)
Protein, ur: NEGATIVE mg/dL
SPECIFIC GRAVITY, URINE: 1.027 (ref 1.005–1.030)
Urobilinogen, UA: 1 mg/dL (ref 0.0–1.0)

## 2015-02-22 LAB — GC/CHLAMYDIA PROBE AMP (~~LOC~~) NOT AT ARMC
Chlamydia: NEGATIVE
NEISSERIA GONORRHEA: NEGATIVE

## 2015-02-22 LAB — URINE MICROSCOPIC-ADD ON

## 2015-02-22 LAB — POC URINE PREG, ED: Preg Test, Ur: NEGATIVE

## 2015-02-22 LAB — WET PREP, GENITAL
Trich, Wet Prep: NONE SEEN
Yeast Wet Prep HPF POC: NONE SEEN

## 2015-02-22 LAB — COMPREHENSIVE METABOLIC PANEL
ALK PHOS: 116 U/L (ref 38–126)
ALT: 13 U/L — AB (ref 14–54)
AST: 17 U/L (ref 15–41)
Albumin: 3.6 g/dL (ref 3.5–5.0)
Anion gap: 5 (ref 5–15)
BUN: 13 mg/dL (ref 6–20)
CALCIUM: 8.8 mg/dL — AB (ref 8.9–10.3)
CO2: 26 mmol/L (ref 22–32)
Chloride: 107 mmol/L (ref 101–111)
Creatinine, Ser: 0.78 mg/dL (ref 0.44–1.00)
GFR calc non Af Amer: >60 mL/min (ref >60)
Glucose, Bld: 101 mg/dL — ABNORMAL HIGH (ref 65–99)
Potassium: 3.7 mmol/L (ref 3.5–5.1)
SODIUM: 138 mmol/L (ref 135–145)
Total Bilirubin: 0.5 mg/dL (ref 0.3–1.2)
Total Protein: 8.1 g/dL (ref 6.5–8.1)

## 2015-02-22 MED ORDER — FENTANYL CITRATE (PF) 100 MCG/2ML IJ SOLN
50.0000 ug | Freq: Once | INTRAMUSCULAR | Status: AC
  Administered 2015-02-22: 50 ug via INTRAVENOUS
  Filled 2015-02-22: qty 2

## 2015-02-22 MED ORDER — CEPHALEXIN 500 MG PO CAPS: 500.0000 mg | ORAL_CAPSULE | Freq: Four times a day (QID) | ORAL | Status: DC

## 2015-02-22 MED ORDER — PHENAZOPYRIDINE HCL 200 MG PO TABS
200.0000 mg | ORAL_TABLET | Freq: Three times a day (TID) | ORAL | Status: DC
Start: 2015-02-22 — End: 2016-01-20

## 2015-02-22 MED ORDER — KETOROLAC TROMETHAMINE 30 MG/ML IJ SOLN
30.0000 mg | Freq: Once | INTRAMUSCULAR | Status: AC
  Administered 2015-02-22: 30 mg via INTRAVENOUS
  Filled 2015-02-22: qty 1

## 2015-02-22 MED ORDER — IBUPROFEN 800 MG PO TABS: 800.0000 mg | ORAL_TABLET | Freq: Three times a day (TID) | ORAL | Status: DC

## 2015-02-22 NOTE — ED Notes (Signed)
Patient unable to provide urine specimen at this time.

## 2015-02-22 NOTE — ED Provider Notes (Signed)
H/O HIV, no recent CD4 Pelvic pressure x 1 week, rectal discomfort (no bleeding or constipation)_ Pending pelvic labs - anticipate normal Plan: f/u Dr. Ninetta Lights for continued treatment HIV  Will treat for UTI, culture pending. Re-evaluation: she is complaining of pain in her ankle and foot, longstanding. Toradol ordered for comfort. Will start on Keflex and encourage follow up with Dr. Ninetta Lights for continuation of therapy for HIV as well as recheck of current infection.  Elpidio Anis, PA-C 02/22/15 1610  Tomasita Crumble, MD 02/22/15 [phone]

## 2015-02-22 NOTE — ED Notes (Signed)
Nurse at bedside, will get labs

## 2015-02-22 NOTE — Discharge Instructions (Signed)

## 2015-03-08 ENCOUNTER — Telehealth: Payer: Self-pay | Admitting: *Deleted

## 2015-03-08 NOTE — Telephone Encounter (Signed)
PA for Stribild received and processed, sent to [facility]

## 2015-04-08 ENCOUNTER — Other Ambulatory Visit: Payer: Self-pay | Admitting: *Deleted

## 2015-04-08 DIAGNOSIS — B20 Human immunodeficiency virus [HIV] disease: Secondary | ICD-10-CM

## 2015-04-08 MED ORDER — ELVITEG-COBIC-EMTRICIT-TENOFDF 150-150-200-300 MG PO TABS: 1.0000 | ORAL_TABLET | Freq: Every day | ORAL | Status: DC

## 2015-04-08 NOTE — Telephone Encounter (Signed)
[facility] rx to [facility] for [employer] - PA has not been approved - [facility]

## 2015-05-11 ENCOUNTER — Other Ambulatory Visit: Payer: Self-pay | Admitting: *Deleted

## 2015-05-11 DIAGNOSIS — Z21 Asymptomatic human immunodeficiency virus [HIV] infection status: Secondary | ICD-10-CM

## 2015-05-11 DIAGNOSIS — B20 Human immunodeficiency virus [HIV] disease: Secondary | ICD-10-CM

## 2015-05-11 MED ORDER — ELVITEG-COBIC-EMTRICIT-TENOFDF 150-150-200-300 MG PO TABS: 1.0000 | ORAL_TABLET | Freq: Every day | ORAL | Status: DC

## 2015-05-11 NOTE — Telephone Encounter (Signed)
PA approved per [facility]

## 2015-05-17 ENCOUNTER — Other Ambulatory Visit: Payer: Medicaid Other

## 2015-06-13 ENCOUNTER — Ambulatory Visit: Payer: Medicaid Other | Admitting: Infectious Diseases

## 2015-07-01 ENCOUNTER — Ambulatory Visit (INDEPENDENT_AMBULATORY_CARE_PROVIDER_SITE_OTHER): Payer: Self-pay | Admitting: Infectious Diseases

## 2015-07-01 ENCOUNTER — Encounter: Payer: Self-pay | Admitting: Infectious Diseases

## 2015-07-01 VITALS — BP 137/94 | HR 73 | Temp 97.6°F | Wt 268.5 lb

## 2015-07-01 DIAGNOSIS — Z21 Asymptomatic human immunodeficiency virus [HIV] infection status: Secondary | ICD-10-CM

## 2015-07-01 DIAGNOSIS — Z79899 Other long term (current) drug therapy: Secondary | ICD-10-CM

## 2015-07-01 DIAGNOSIS — R87613 High grade squamous intraepithelial lesion on cytologic smear of cervix (HGSIL): Secondary | ICD-10-CM

## 2015-07-01 DIAGNOSIS — B20 Human immunodeficiency virus [HIV] disease: Secondary | ICD-10-CM

## 2015-07-01 DIAGNOSIS — Z113 Encounter for screening for infections with a predominantly sexual mode of transmission: Secondary | ICD-10-CM

## 2015-07-01 MED ORDER — ELVITEG-COBIC-EMTRICIT-TENOFAF 150-150-200-10 MG PO TABS: 1.0000 | ORAL_TABLET | Freq: Every day | ORAL | Status: DC

## 2015-07-01 MED ORDER — SULFAMETHOXAZOLE-TRIMETHOPRIM 800-160 MG PO TABS: 1.0000 | ORAL_TABLET | ORAL | Status: DC

## 2015-07-01 NOTE — Assessment & Plan Note (Signed)
Will get her set up for repeat PAP, GYN referal if any abn

## 2015-07-01 NOTE — Assessment & Plan Note (Signed)
Will get her in with [facility] to get her back on meds.  She seems more bronchitic than pneumonia. Her SpO2 is 99% I have aksed her to try OTCs (alka-seltzer colds), if she is not better over weekend, consider z-pack.  Will see her back 4-6 weeks.  Will get set up for dental, pap.

## 2015-07-01 NOTE — Progress Notes (Signed)
   Subjective:    Patient ID: Brenda Tyler, female    DOB: 08/03/1985, 30 y.o.   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 Previously started on bactrim and stribild. Has been feeling poorly ~ 1 week. Has been coughing, having body aches, pleutritic pain. Has had chills, no fever. Has taken no nyquil.  [facility] powder for headaches.  Has been off ART for 3 months.   HIV 1 RNA QUANT (copies/mL)  Date Value  07/02/2014 6119*  07/02/2014 8595*  05/10/2014 [id number]*   CD4 T CELL ABS (/uL)  Date Value  07/02/2014 100*  05/10/2014 190*  04/06/2014 190*   Has not had PAP in last year.  Amenorrheic.  Has not had flu shot.   Review of Systems  Constitutional: Positive for chills. Negative for fever, appetite change and unexpected weight change.  HENT: Negative for rhinorrhea and sore throat.   Respiratory: Positive for cough.   Cardiovascular: Positive for chest pain.  Gastrointestinal: Negative for constipation and blood in stool.  Genitourinary: Positive for menstrual problem. Negative for dysuria and difficulty urinating.       Objective:   Physical Exam  Constitutional: She appears well-developed and well-nourished.  Non-toxic appearance.  HENT:  Mouth/Throat: No oropharyngeal exudate.  Eyes: EOM are normal. Pupils are equal, round, and reactive to light.  Neck: Neck supple.  Cardiovascular: Normal rate, regular rhythm and normal heart sounds.   Pulmonary/Chest: Effort normal and breath sounds normal.  Abdominal: Soft. Bowel sounds are normal. There is no tenderness. There is no rebound.  Musculoskeletal: She exhibits no edema.  Lymphadenopathy:    She has no cervical adenopathy.       Assessment & Plan:

## 2015-07-04 ENCOUNTER — Other Ambulatory Visit: Payer: Medicaid Other

## 2015-07-08 ENCOUNTER — Ambulatory Visit (INDEPENDENT_AMBULATORY_CARE_PROVIDER_SITE_OTHER): Payer: Self-pay | Admitting: *Deleted

## 2015-07-08 ENCOUNTER — Other Ambulatory Visit: Payer: Self-pay

## 2015-07-08 DIAGNOSIS — B20 Human immunodeficiency virus [HIV] disease: Secondary | ICD-10-CM

## 2015-07-08 DIAGNOSIS — Z113 Encounter for screening for infections with a predominantly sexual mode of transmission: Secondary | ICD-10-CM

## 2015-07-08 DIAGNOSIS — Z79899 Other long term (current) drug therapy: Secondary | ICD-10-CM

## 2015-07-08 DIAGNOSIS — Z124 Encounter for screening for malignant neoplasm of cervix: Secondary | ICD-10-CM

## 2015-07-08 LAB — LIPID PANEL
CHOL/HDL RATIO: 3.2 ratio (ref <=5.0)
Cholesterol: 158 mg/dL (ref 125–200)
HDL: 50 mg/dL (ref >=46)
LDL Cholesterol: 94 mg/dL (ref <130)
Triglycerides: 68 mg/dL (ref <150)
VLDL: 14 mg/dL (ref <30)

## 2015-07-08 LAB — COMPREHENSIVE METABOLIC PANEL
ALBUMIN: 3.7 g/dL (ref 3.6–5.1)
ALT: 21 U/L (ref 6–29)
AST: 20 U/L (ref 10–30)
Alkaline Phosphatase: 132 U/L — ABNORMAL HIGH (ref 33–115)
BUN: 12 mg/dL (ref 7–25)
CALCIUM: 9.2 mg/dL (ref 8.6–10.2)
CHLORIDE: 105 mmol/L (ref 98–110)
CO2: 27 mmol/L (ref 20–31)
Creat: 0.78 mg/dL (ref 0.50–1.10)
Glucose, Bld: 83 mg/dL (ref 65–99)
POTASSIUM: 4.1 mmol/L (ref 3.5–5.3)
Sodium: 139 mmol/L (ref 135–146)
TOTAL PROTEIN: 7.5 g/dL (ref 6.1–8.1)
Total Bilirubin: 0.3 mg/dL (ref 0.2–1.2)

## 2015-07-08 LAB — CBC
HEMATOCRIT: 37.5 % (ref 36.0–46.0)
Hemoglobin: 12.4 g/dL (ref 12.0–15.0)
MCH: 28.1 pg (ref 26.0–34.0)
MCHC: 33.1 g/dL (ref 30.0–36.0)
MCV: 85 fL (ref 78.0–100.0)
MPV: 10.5 fL (ref 8.6–12.4)
PLATELETS: 274 10*3/uL (ref 150–400)
RBC: 4.41 MIL/uL (ref 3.87–5.11)
RDW: 12.6 % (ref 11.5–15.5)
WBC: 3.2 10*3/uL — AB (ref 4.0–10.5)

## 2015-07-08 LAB — T-HELPER CELL (CD4) - (RCID CLINIC ONLY)
CD4 % Helper T Cell: 19 % — ABNORMAL LOW (ref 33–55)
CD4 T CELL ABS: 200 /uL — AB (ref 400–2700)

## 2015-07-08 NOTE — Progress Notes (Signed)
  Subjective:     Brenda Tyler is a 30 y.o. woman who comes in today for a  pap smear only.  Previous abnormal Pap smears: no. Contraception: condoms.  Objective:    LMP 06/25/08, pt not having periods.  Was told that she has gone through "early menopause."  Pelvic Exam:  Pap smear obtained.   Assessment:    Screening pap smear.              Plan:    Follow up in one year, or as indicated by Pap results.   Pt given educational materials re: HIV and women, self-esteem, BSE, nutrition and diet management, PAP smears and partner safety. Pt given condoms.

## 2015-07-08 NOTE — Patient Instructions (Signed)
Your results will be ready in about a week.  I will mail them to you.  Thank you for coming to the Center for your care.  Jaselle Pryer,  RN 

## 2015-07-09 LAB — RPR

## 2015-07-11 LAB — URINE CYTOLOGY ANCILLARY ONLY
Chlamydia: NEGATIVE
NEISSERIA GONORRHEA: NEGATIVE

## 2015-07-11 LAB — HIV-1 RNA QUANT-NO REFLEX-BLD
HIV 1 RNA QUANT: 11759 {copies}/mL — AB (ref <20)
HIV-1 RNA Quant, Log: 4.07 Log copies/mL — ABNORMAL HIGH (ref <1.30)

## 2015-07-11 LAB — CERVICOVAGINAL ANCILLARY ONLY
Chlamydia: NEGATIVE
Neisseria Gonorrhea: NEGATIVE

## 2015-07-13 LAB — CYTOLOGY - PAP

## 2015-07-15 ENCOUNTER — Encounter: Payer: Self-pay | Admitting: *Deleted

## 2015-08-10 ENCOUNTER — Ambulatory Visit: Payer: Medicaid Other | Admitting: Infectious Diseases

## 2015-09-05 ENCOUNTER — Telehealth: Payer: Self-pay

## 2015-09-05 ENCOUNTER — Ambulatory Visit: Payer: Self-pay | Admitting: *Deleted

## 2015-09-05 ENCOUNTER — Other Ambulatory Visit: Payer: Self-pay | Admitting: *Deleted

## 2015-09-05 ENCOUNTER — Encounter: Payer: Self-pay | Admitting: Infectious Diseases

## 2015-09-05 ENCOUNTER — Ambulatory Visit (INDEPENDENT_AMBULATORY_CARE_PROVIDER_SITE_OTHER): Payer: Self-pay | Admitting: Infectious Diseases

## 2015-09-05 VITALS — BP 132/89 | HR 71 | Temp 97.4°F | Wt 282.0 lb

## 2015-09-05 DIAGNOSIS — F32A Depression, unspecified: Secondary | ICD-10-CM

## 2015-09-05 DIAGNOSIS — R87613 High grade squamous intraepithelial lesion on cytologic smear of cervix (HGSIL): Secondary | ICD-10-CM

## 2015-09-05 DIAGNOSIS — B20 Human immunodeficiency virus [HIV] disease: Secondary | ICD-10-CM

## 2015-09-05 DIAGNOSIS — F329 Major depressive disorder, single episode, unspecified: Secondary | ICD-10-CM

## 2015-09-05 MED ORDER — ELVITEG-COBIC-EMTRICIT-TENOFAF 150-150-200-10 MG PO TABS: 1.0000 | ORAL_TABLET | Freq: Every day | ORAL | Status: DC

## 2015-09-05 NOTE — BH Specialist Note (Signed)
Counselor met with Brenda Tyler in the exam room today per request by Dr. Johnnye Sima as patient has not been seen in a year nor been taking her medications.  Patient was oriented times four with flat affect but clean dress.  Patient was alert but not very talkative. Patient appeared sad and tended to look down at a magazine while counselor was sharing with her.  Patient did not give much eye contact. Patient was soft spoken and did not give much detail about what was going on in her life.  Patient did communicate that she had moved to [facility] briefly but has come back to [place]. Patient admitted while she was gone she did not take her medications. Patient indicates that she wants to get back to her treatment and stay committed this time.  Counselor provided support and encouragement for patient.  Counselor discussed with patient that counseling services were available to her and recommended that she make an appointment.  Patient stated that she would.   [name], MA [facility]

## 2015-09-05 NOTE — Progress Notes (Signed)
   Subjective:    Patient ID: Brenda Tyler, female    DOB: 07/26/1985, 30 y.o.   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 Previously started on bactrim and stribild. Was seen Jan 2017 and was off her ART.  Still off ART. She is unaware of her drug coverage. She had prev visit with LyDonna but issue not resolved.   HIV 1 RNA QUANT (copies/mL)  Date Value  07/08/2015 11759*  07/02/2014 6119*  07/02/2014 8595*   CD4 T CELL ABS (/uL)  Date Value  07/08/2015 200*  07/02/2014 100*  05/10/2014 190*    Review of Systems  Constitutional: Negative for fever, chills, appetite change and unexpected weight change.  Gastrointestinal: Negative for diarrhea and constipation.  Genitourinary: Positive for menstrual problem. Negative for difficulty urinating.  ammenrheic.      Objective:   Physical Exam  Constitutional: She appears well-developed and well-nourished.  HENT:  Mouth/Throat: No oropharyngeal exudate.  Eyes: EOM are normal. Pupils are equal, round, and reactive to light.  Neck: Neck supple.  Cardiovascular: Normal rate, regular rhythm and normal heart sounds.   Pulmonary/Chest: Effort normal and breath sounds normal.  Abdominal: Soft. Bowel sounds are normal. There is no tenderness. There is no rebound.  Lymphadenopathy:    She has no cervical adenopathy.      Assessment & Plan:

## 2015-09-05 NOTE — Telephone Encounter (Signed)
Called pt to let her know that gynecological referral will need an orange card. Gave her information [facility] Read some of the items that are needed for application process. Told pt to call tomorrow(09/06/2015) to start process so that gynecology appt can be set up ASAP. Pt phone disconnected. Called back but call went to voice mail. No message left.

## 2015-09-05 NOTE — Assessment & Plan Note (Signed)
Will get her back in with GYN °

## 2015-09-05 NOTE — Assessment & Plan Note (Signed)
Will get her onto meds Will start genvoya.  CD4 is 200, no bactrim today Offered/refused condoms.  rtc in 2 months.  She will meet with Marcelino DusterMichelle today

## 2015-10-05 ENCOUNTER — Ambulatory Visit: Payer: Self-pay | Admitting: *Deleted

## 2015-10-08 IMAGING — CT CT HEAD WO/W CM
1 of 2 series · 13 of 30 positions shown, 17 images · IV contrast (omnipaque)
Comparison: None.

CLINICAL DATA: Fever, headache, nausea and vomiting after beginning
new HIV medication.

EXAM:
CT HEAD WITHOUT AND WITH CONTRAST
TECHNIQUE: Contiguous axial images were obtained from the base of the skull
through the vertex without and with intravenous contrast
CONTRAST:  100mL OMNIPAQUE IOHEXOL 300 MG/ML  SOLN

[Series 2: head w/o · axial · non-contrast · 0.43mm/px · z∈[-179,-59]mm · 13 of 28 slices shown, 17 images]
[im 2/28  brain]
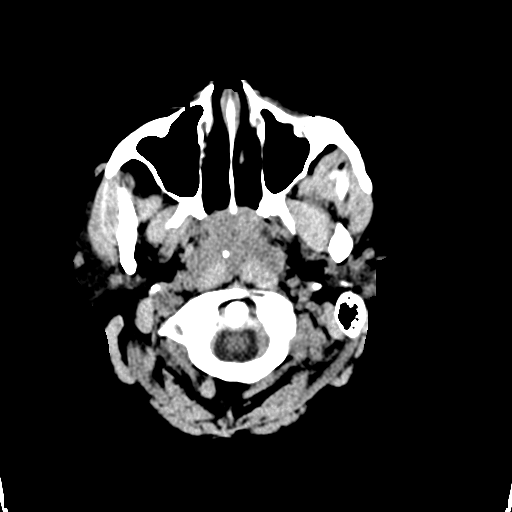
[im 2/28  bone]
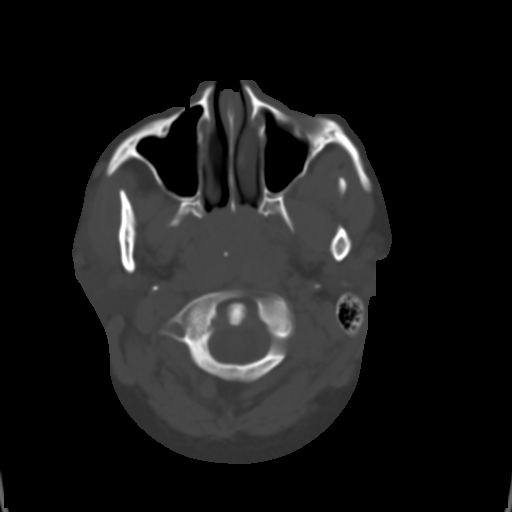
[im 4/28  brain]
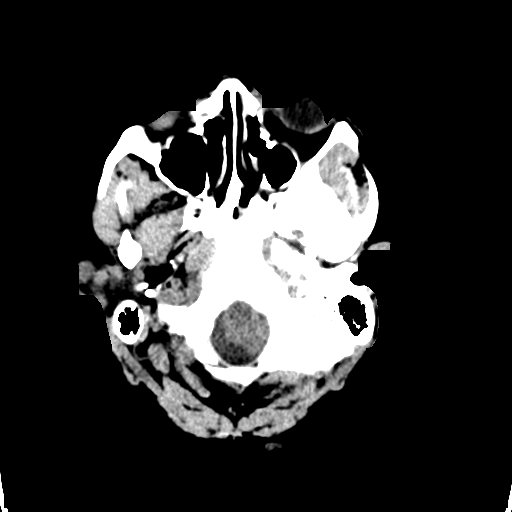
[im 6/28  brain]
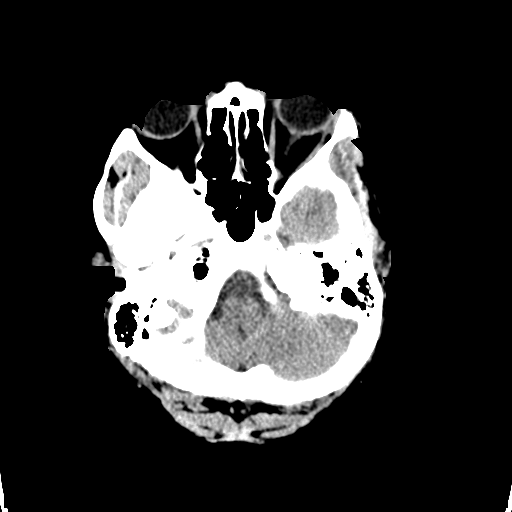
[im 8/28  brain]
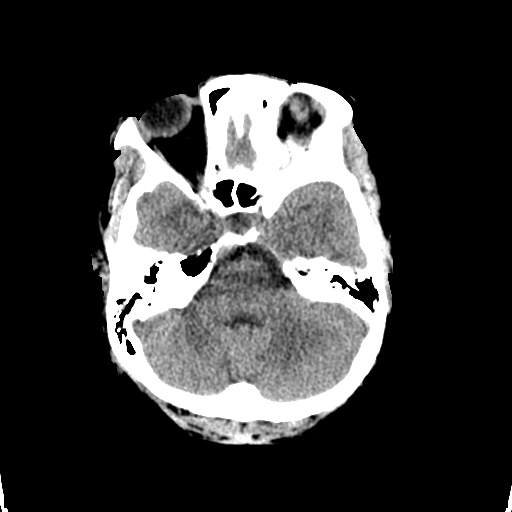
[im 10/28  brain]
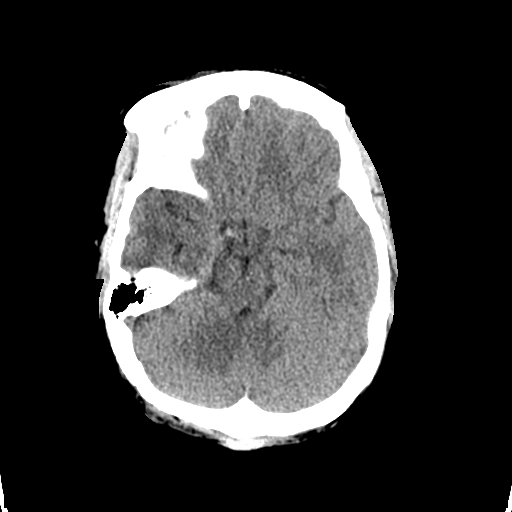
[im 10/28  bone]
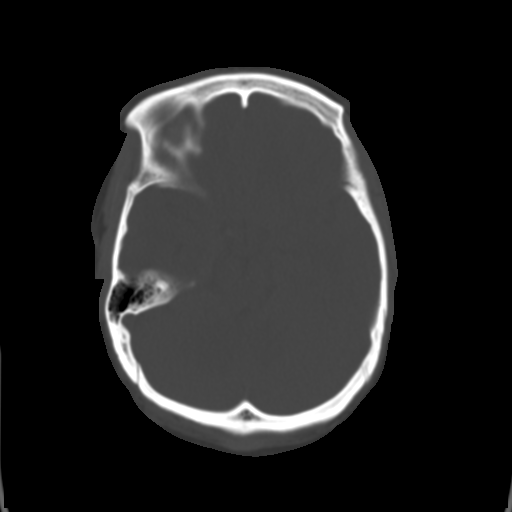
[im 12/28  brain]
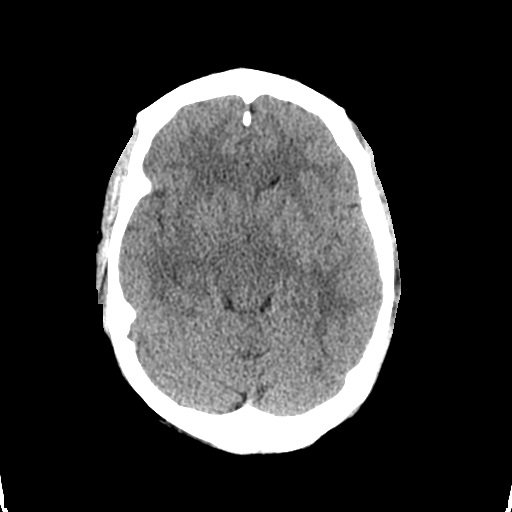
[im 14/28  brain]
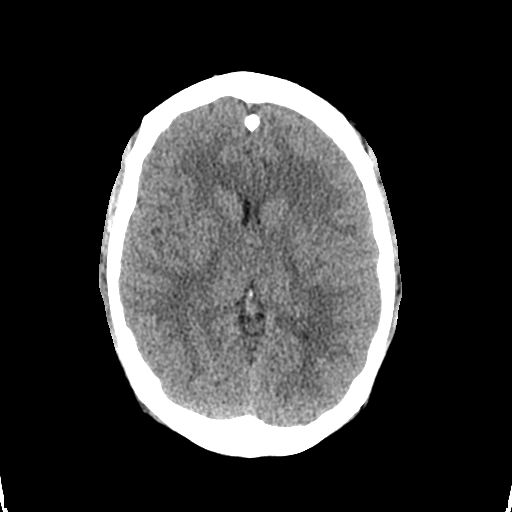
[im 16/28  brain]
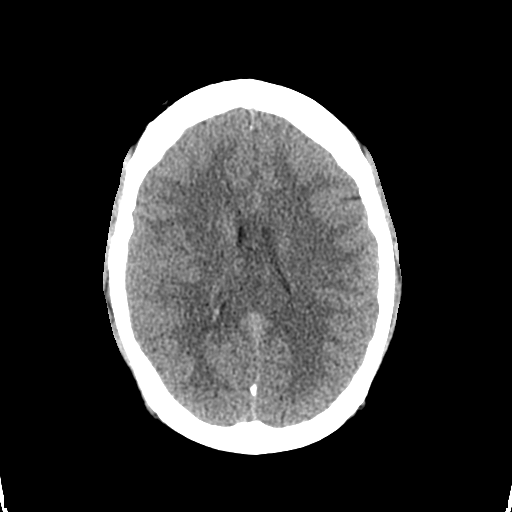
[im 18/28  brain]
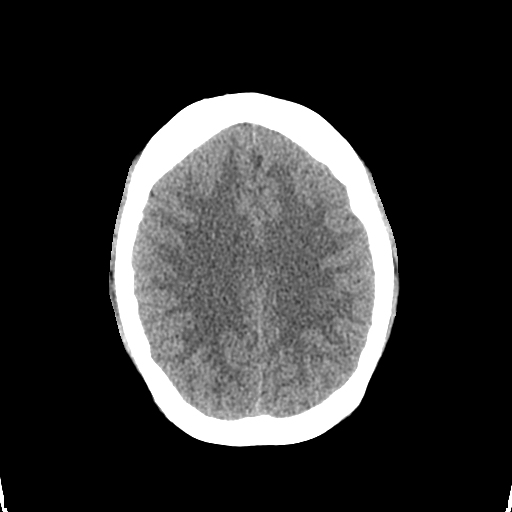
[im 18/28  bone]
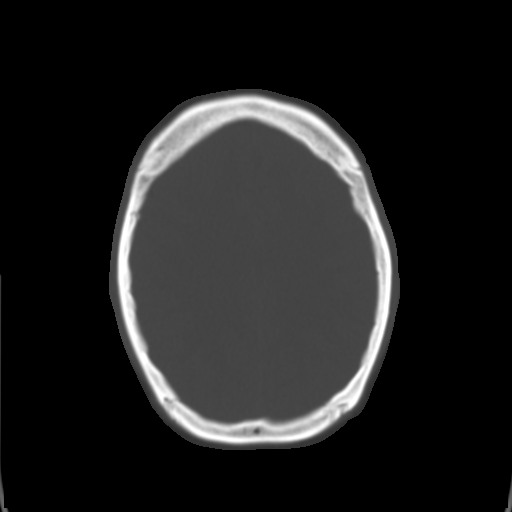
[im 20/28  brain]
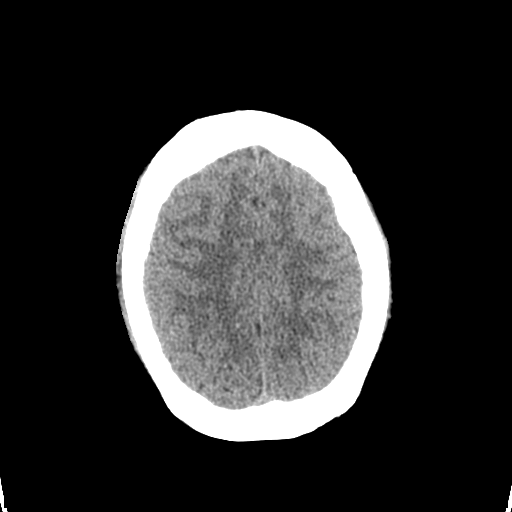
[im 22/28  brain]
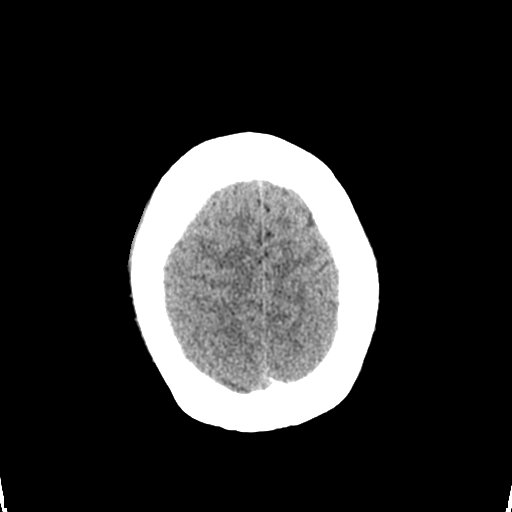
[im 24/28  brain]
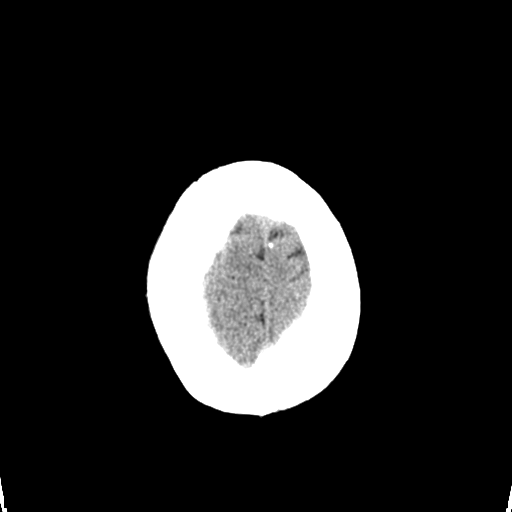
[im 26/28  brain]
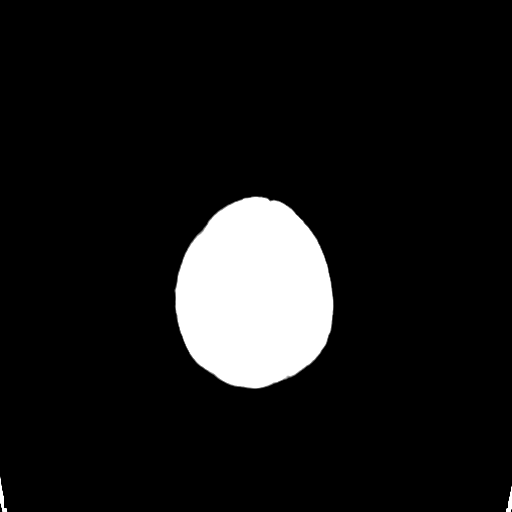
[im 26/28  bone]
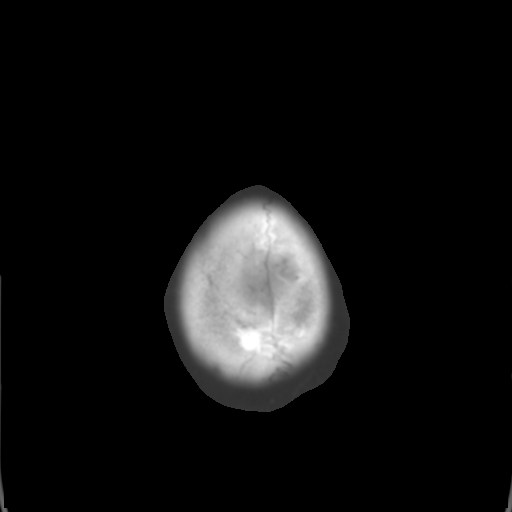

[13 of 30 positions shown; findings below may reference images not displayed]

FINDINGS: The ventricles and sulci are normal. No intraparenchymal hemorrhage,
mass effect nor midline shift. No acute large vascular territory
infarcts. Cerebellar tonsils at but not below the foramen magnum.

No abnormal extra-axial fluid collections. Basal cisterns are
patent.

No skull fracture. The included ocular globes and orbital contents
are non-suspicious. The mastoid aircells and included paranasal
sinuses are well-aerated. Fullness of the nasopharyngeal soft
tissues can be seen in immunocompromised states.
IMPRESSION: No acute intracranial process; normal noncontrast CT of the head

  By: Garcia Anne

## 2015-10-14 ENCOUNTER — Other Ambulatory Visit: Payer: Self-pay | Admitting: *Deleted

## 2015-10-14 ENCOUNTER — Telehealth: Payer: Self-pay | Admitting: *Deleted

## 2015-10-14 DIAGNOSIS — B20 Human immunodeficiency virus [HIV] disease: Secondary | ICD-10-CM

## 2015-10-14 MED ORDER — ELVITEG-COBIC-EMTRICIT-TENOFAF 150-150-200-10 MG PO TABS: 1.0000 | ORAL_TABLET | Freq: Every day | ORAL | Status: DC

## 2015-10-14 NOTE — Telephone Encounter (Signed)
Call from patient stating she will soon be out of meds and is not sure if her ADAP is approved. [facility] to [facility]. Voice mail and note given to Con MemosMichelle Evans, financial assistant to assist with this. Wendall MolaJacqueline Davion Flannery

## 2016-01-20 ENCOUNTER — Encounter (HOSPITAL_COMMUNITY): Payer: Self-pay | Admitting: Nurse Practitioner

## 2016-01-20 ENCOUNTER — Emergency Department (HOSPITAL_COMMUNITY): Payer: Self-pay

## 2016-01-20 ENCOUNTER — Inpatient Hospital Stay (HOSPITAL_COMMUNITY)
Admission: EM | Admit: 2016-01-20 | Discharge: 2016-01-23 | DRG: 562 | Disposition: A | Discharge status: Home Health | Payer: Self-pay | Attending: Orthopaedic Surgery | Admitting: Orthopaedic Surgery

## 2016-01-20 DIAGNOSIS — Z79899 Other long term (current) drug therapy: Secondary | ICD-10-CM

## 2016-01-20 DIAGNOSIS — Z833 Family history of diabetes mellitus: Secondary | ICD-10-CM

## 2016-01-20 DIAGNOSIS — W101XXA Fall (on)(from) sidewalk curb, initial encounter: Secondary | ICD-10-CM | POA: Diagnosis present

## 2016-01-20 DIAGNOSIS — S8992XA Unspecified injury of left lower leg, initial encounter: Secondary | ICD-10-CM

## 2016-01-20 DIAGNOSIS — S83115A Anterior dislocation of proximal end of tibia, left knee, initial encounter: Principal | ICD-10-CM | POA: Diagnosis present

## 2016-01-20 DIAGNOSIS — B20 Human immunodeficiency virus [HIV] disease: Secondary | ICD-10-CM | POA: Diagnosis present

## 2016-01-20 DIAGNOSIS — I10 Essential (primary) hypertension: Secondary | ICD-10-CM | POA: Diagnosis present

## 2016-01-20 DIAGNOSIS — Z8249 Family history of ischemic heart disease and other diseases of the circulatory system: Secondary | ICD-10-CM

## 2016-01-20 DIAGNOSIS — Z9114 Patient's other noncompliance with medication regimen: Secondary | ICD-10-CM

## 2016-01-20 DIAGNOSIS — E876 Hypokalemia: Secondary | ICD-10-CM | POA: Diagnosis present

## 2016-01-20 DIAGNOSIS — Z885 Allergy status to narcotic agent status: Secondary | ICD-10-CM

## 2016-01-20 DIAGNOSIS — S83512A Sprain of anterior cruciate ligament of left knee, initial encounter: Secondary | ICD-10-CM

## 2016-01-20 LAB — I-STAT BETA HCG BLOOD, ED (MC, WL, AP ONLY)

## 2016-01-20 LAB — CBC WITH DIFFERENTIAL/PLATELET
BASOS PCT: 0 %
Basophils Absolute: 0 10*3/uL (ref 0.0–0.1)
EOS ABS: 0.1 10*3/uL (ref 0.0–0.7)
EOS PCT: 1 %
HEMATOCRIT: 36.5 % (ref 36.0–46.0)
Hemoglobin: 12.1 g/dL (ref 12.0–15.0)
Lymphocytes Relative: 7 %
Lymphs Abs: 0.5 10*3/uL — ABNORMAL LOW (ref 0.7–4.0)
MCH: 28.7 pg (ref 26.0–34.0)
MCHC: 33.2 g/dL (ref 30.0–36.0)
MCV: 86.7 fL (ref 78.0–100.0)
MONO ABS: 0.6 10*3/uL (ref 0.1–1.0)
MONOS PCT: 8 %
NEUTROS ABS: 5.9 10*3/uL (ref 1.7–7.7)
Neutrophils Relative %: 84 %
PLATELETS: 229 10*3/uL (ref 150–400)
RBC: 4.21 MIL/uL (ref 3.87–5.11)
RDW: 12.9 % (ref 11.5–15.5)
WBC: 7.1 10*3/uL (ref 4.0–10.5)

## 2016-01-20 LAB — I-STAT CHEM 8, ED
BUN: 10 mg/dL (ref 6–20)
CALCIUM ION: 1.06 mmol/L — AB (ref 1.13–1.30)
Chloride: 106 mmol/L (ref 101–111)
Creatinine, Ser: 0.7 mg/dL (ref 0.44–1.00)
GLUCOSE: 110 mg/dL — AB (ref 65–99)
HCT: 42 % (ref 36.0–46.0)
HEMOGLOBIN: 14.3 g/dL (ref 12.0–15.0)
Potassium: 3.1 mmol/L — ABNORMAL LOW (ref 3.5–5.1)
Sodium: 142 mmol/L (ref 135–145)
TCO2: 23 mmol/L (ref 0–100)

## 2016-01-20 LAB — BASIC METABOLIC PANEL
ANION GAP: 7 (ref 5–15)
BUN: 11 mg/dL (ref 6–20)
CO2: 22 mmol/L (ref 22–32)
Calcium: 8.7 mg/dL — ABNORMAL LOW (ref 8.9–10.3)
Chloride: 109 mmol/L (ref 101–111)
Creatinine, Ser: 0.73 mg/dL (ref 0.44–1.00)
GLUCOSE: 110 mg/dL — AB (ref 65–99)
POTASSIUM: 3.1 mmol/L — AB (ref 3.5–5.1)
Sodium: 138 mmol/L (ref 135–145)

## 2016-01-20 MED ORDER — MORPHINE SULFATE (PF) 4 MG/ML IV SOLN
4.0000 mg | INTRAVENOUS | Status: AC | PRN
  Administered 2016-01-20 – 2016-01-21 (×3): 4 mg via INTRAVENOUS
  Filled 2016-01-20 (×3): qty 1

## 2016-01-20 MED ORDER — OXYCODONE-ACETAMINOPHEN 5-325 MG PO TABS
2.0000 | ORAL_TABLET | ORAL | Status: DC | PRN
  Administered 2016-01-20 – 2016-01-23 (×7): 2 via ORAL
  Filled 2016-01-20 (×8): qty 2

## 2016-01-20 MED ORDER — ONDANSETRON HCL 4 MG/2ML IJ SOLN
4.0000 mg | Freq: Four times a day (QID) | INTRAMUSCULAR | Status: DC | PRN
  Administered 2016-01-20 – 2016-01-22 (×5): 4 mg via INTRAVENOUS
  Filled 2016-01-20 (×5): qty 2

## 2016-01-20 MED ORDER — IOPAMIDOL (ISOVUE-370) INJECTION 76%
100.0000 mL | Freq: Once | INTRAVENOUS | Status: AC | PRN
  Administered 2016-01-20: 100 mL via INTRAVENOUS

## 2016-01-20 MED ORDER — HYDROMORPHONE HCL 1 MG/ML IJ SOLN
1.0000 mg | Freq: Once | INTRAMUSCULAR | Status: AC
  Administered 2016-01-20: 1 mg via INTRAVENOUS
  Filled 2016-01-20: qty 1

## 2016-01-20 MED ORDER — PROPOFOL 10 MG/ML IV BOLUS
200.0000 mg | Freq: Once | INTRAVENOUS | Status: AC
  Administered 2016-01-20: 200 mg via INTRAVENOUS
  Filled 2016-01-20: qty 20

## 2016-01-20 MED ORDER — HYDROMORPHONE HCL 1 MG/ML IJ SOLN
1.0000 mg | INTRAMUSCULAR | Status: DC | PRN
  Administered 2016-01-20 – 2016-01-22 (×2): 1 mg via INTRAVENOUS
  Filled 2016-01-20 (×3): qty 1

## 2016-01-20 MED ORDER — FENTANYL CITRATE (PF) 100 MCG/2ML IJ SOLN
100.0000 ug | Freq: Once | INTRAMUSCULAR | Status: AC
  Administered 2016-01-20: 100 ug via NASAL
  Filled 2016-01-20: qty 2

## 2016-01-20 MED ORDER — ONDANSETRON 8 MG PO TBDP
8.0000 mg | ORAL_TABLET | Freq: Once | ORAL | Status: AC
  Administered 2016-01-20: 8 mg via ORAL
  Filled 2016-01-20: qty 1

## 2016-01-20 NOTE — ED Notes (Signed)
MD at bedside. 

## 2016-01-20 NOTE — ED Triage Notes (Signed)
Patient arrives to [facility] via [facility] after suffering a fall while ambulating over a 6" curb. She now complains of left leg pain which she localizes to her knee and ankle. EMS applied a leg splint, placed her on a titan tarp and transported to ED. Endorses pain as a 10/10.

## 2016-01-20 NOTE — ED Provider Notes (Signed)
[facility]EMERGENCY DEPT Provider Note   CSN: [id number] Arrival date & time: 01/20/16  1742  First Provider Contact:  First MD Initiated Contact with Patient 01/20/16 1759        History   Chief Complaint Chief Complaint  Patient presents with  . Leg Injury    left leg  . Leg Pain    HPI Brenda Tyler is a 30 y.o. femalewho presents with cc of left leg pain and fall. She has a PMH of HIV and has been noncompliant with her medications. Last CD4 count 6 months ago was <200 and viral load aw >11,000. The patient states that she was walking to the store when she slipped on some mud and hyper extended her Left hip and knee. She is c/o severe pain in the knee and ankle, but is unable to specify and states " my whole leg hurts." She states that her knee went back went back ward and she felt something pop. She is unsure if it dislocated. She denies numbness or tingling. She states that her pain is severe.    HPI  Past Medical History:  Diagnosis Date  . HIV (human immunodeficiency virus infection) (HCC)   . HSV (herpes simplex virus) infection   . Hypertension   . Obesity     Patient Active Problem List   Diagnosis Date Noted  . Depression 07/26/2014  . Oropharyngeal candidiasis 07/05/2014  . 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 Nausea without vomiting 07/02/2014  . UTI (lower urinary tract infection) 07/02/2014  . HIV disease (HCC) 04/23/2014  . General counseling for prescription of oral contraceptives 06/11/2013  . 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 Obesity 12/22/2012  . HGSIL on Pap smear of cervix 12/22/2012    Past Surgical History:  Procedure Laterality Date  . INCISION AND DRAINAGE ABSCESS     on abdomen and buttocks    OB History    Gravida Para Term Preterm AB Living   SAB TAB Ectopic Multiple Live Births     1             Home Medications    Prior to Admission medications   Medication Sig Start Date End Date Taking? Authorizing Provider    acyclovir ointment (ZOVIRAX) 5 % Apply topically 5 (five) times daily. Patient not taking: Reported on 07/26/2014 07/05/14   Clydia Llano, MD  albuterol (PROVENTIL HFA;VENTOLIN HFA) 108 (90 BASE) MCG/ACT inhaler Inhale 1-2 puffs into the lungs every 6 (six) hours as needed for wheezing or shortness of breath. Patient not taking: Reported on 07/26/2014 06/10/14   Toni Amend Forcucci, PA-C  cephALEXin (KEFLEX) 500 MG capsule Take 1 capsule (500 mg total) by mouth 4 (four) times daily. Patient not taking: Reported on 09/05/2015 02/22/15   Elpidio Anis, PA-C  Cetirizine HCl 10 MG CAPS Take 1 capsule (10 mg total) by mouth at bedtime as needed (congestion). Patient not taking: Reported on 02/21/2015 06/10/14   Terri Piedra, PA-C  elvitegravir-cobicistat-emtricitabine-tenofovir (GENVOYA) 150-150-200-10 MG TABS tablet Take 1 tablet by mouth daily with breakfast. 10/14/15   Ginnie Smart, MD  fluconazole (DIFLUCAN) 100 MG tablet Take 1 tablet (100 mg total) by mouth daily. Patient not taking: Reported on 02/21/2015 07/05/14   Clydia Llano, MD  HYDROcodone-acetaminophen (NORCO/VICODIN) 5-325 MG per tablet Take 1-2 tablets by mouth every 6 (six) hours as needed for moderate pain. Reported on 09/05/2015    Historical Provider, MD  ibuprofen (ADVIL,MOTRIN) 800 MG tablet Take  1 tablet (800 mg total) by mouth 3 (three) times daily. Patient not taking: Reported on 09/05/2015 02/22/15   Elpidio Anis, PA-C  phenazopyridine (PYRIDIUM) 200 MG tablet Take 1 tablet (200 mg total) by mouth 3 (three) times daily. Patient not taking: Reported on 09/05/2015 02/22/15   Elpidio Anis, PA-C  promethazine (PHENERGAN) 25 MG tablet Take 1 tablet (25 mg total) by mouth every 6 (six) hours as needed for nausea or vomiting. Patient not taking: Reported on 02/21/2015 06/30/14   Dahlia Client Muthersbaugh, PA-C  ranitidine (ZANTAC) 150 MG tablet Take 1 tablet (150 mg total) by mouth 2 (two) times daily. Patient not taking: Reported on 02/21/2015  04/25/14   Gilda Crease, MD  sulfamethoxazole-trimethoprim (BACTRIM DS,SEPTRA DS) 800-160 MG tablet Take 1 tablet by mouth 3 (three) times a week. Patient not taking: Reported on 09/05/2015 07/01/15   Ginnie Smart, MD    Family History Family History  Problem Relation Age of Onset  . Heart disease Mother   . Hypertension Maternal Grandmother   . Diabetes Maternal Grandmother   . Heart disease Maternal Grandmother   . Cancer Maternal Grandmother   . Hypertension Paternal Grandmother   . Diabetes Paternal Grandmother   . Heart disease Paternal Grandmother     Social History Social History  Substance Use Topics  . Smoking status: Never Smoker  . Smokeless tobacco: Never Used  . Alcohol use 1.2 oz/week    2 Standard drinks or equivalent per week     Comment: occasional     Allergies   Dilaudid [hydromorphone hcl]   Review of Systems Review of Systems  Ten systems reviewed and are negative for acute change, except as noted in the HPI.    Physical Exam Updated Vital Signs BP 135/90 (BP Location: Left Wrist)   Pulse 82   Temp 98.7 F (37.1 C) (Oral)   Resp 18   LMP 06/25/2008   SpO2 100%   Physical Exam  Constitutional: She is oriented to person, place, and time. She appears well-developed and well-nourished. She appears distressed.  Patient appears in extreme pain Her teeth are chattering She is shaking and crying  HENT:  Head: Normocephalic and atraumatic.  Eyes: Conjunctivae are normal. No scleral icterus.  Neck: Normal range of motion.  Cardiovascular: Normal rate, regular rhythm and normal heart sounds.  Exam reveals no gallop and no friction rub.   No murmur heard. Pulmonary/Chest: Effort normal and breath sounds normal. No respiratory distress.  Abdominal: Soft. Bowel sounds are normal. She exhibits no distension and no mass. There is no tenderness. There is no guarding.  Musculoskeletal:  Patient examined in the splint There is mild swelling  of the left ankle. DP and PT pulses are palpable. Sensation is intact. Exam is limited by obesity. Patient has a dimple sign in the left knee. Unable to move or assess knee due to severe pain;  Neurological: She is alert and oriented to person, place, and time.  Skin: Skin is warm and dry. She is not diaphoretic.  Nursing note and vitals reviewed.    ED Treatments / Results  Labs (all labs ordered are listed, but only abnormal results are displayed) Labs Reviewed - No data to display  EKG  EKG Interpretation None       Radiology No results found.  Procedures Procedures (including critical care time)  Medications Ordered in ED Medications  fentaNYL (SUBLIMAZE) injection 100 mcg (not administered)  ondansetron (ZOFRAN-ODT) disintegrating tablet 8 mg (not administered)  Initial Impression / Assessment and Plan / ED Course  I have reviewed the triage vital signs and the nursing notes.  Pertinent labs & imaging results that were available during my care of the patient were reviewed by me and considered in my medical decision making (see chart for details).  Clinical Course  Value Comment By Time  DG Knee Complete 4 Views Left (Reviewed) Arthor Captain, PA-C 07/28 2016  DG Knee Complete 4 Views Left (Reviewed) Arthor Captain, PA-C 07/28 2021    Patient with anterior knee dislocation. Successfully relocated. (see notes by Dr. Rolland Porter on 01/20/2016) She now has a thready PT pulse which is heard on Doppler. CT angio leg is ordered for eval. Patient seen in shared visit with attending physician. Dr. Fayrene Fearing will assume care of the patient for continued    Final Clinical Impressions(s) / ED Diagnoses   Final diagnoses:  Left knee injury, initial encounter    New Prescriptions New Prescriptions   No medications on file     Arthor Captain, PA-C 01/20/16 2116    Rolland Porter, MD 01/20/16 2329

## 2016-01-20 NOTE — ED Notes (Signed)
Bed: TG62 Expected date:  Expected time:  Means of arrival:  Comments: EMS-fall/deformity

## 2016-01-21 ENCOUNTER — Encounter (HOSPITAL_COMMUNITY): Payer: Self-pay

## 2016-01-21 ENCOUNTER — Inpatient Hospital Stay (HOSPITAL_COMMUNITY): Payer: Self-pay

## 2016-01-21 DIAGNOSIS — S83115A Anterior dislocation of proximal end of tibia, left knee, initial encounter: Secondary | ICD-10-CM | POA: Diagnosis present

## 2016-01-21 LAB — MRSA PCR SCREENING: MRSA BY PCR: NEGATIVE

## 2016-01-21 MED ORDER — DIPHENHYDRAMINE HCL 12.5 MG/5ML PO ELIX: 12.5000 mg | ORAL_SOLUTION | ORAL | Status: DC | PRN

## 2016-01-21 MED ORDER — ELVITEG-COBIC-EMTRICIT-TENOFAF 150-150-200-10 MG PO TABS
1.0000 | ORAL_TABLET | Freq: Every day | ORAL | Status: DC
  Administered 2016-01-21 – 2016-01-23 (×3): 1 via ORAL
  Filled 2016-01-21 (×3): qty 1

## 2016-01-21 MED ORDER — RIVAROXABAN 10 MG PO TABS
10.0000 mg | ORAL_TABLET | Freq: Every day | ORAL | Status: DC
Start: 2016-01-21 — End: 2016-01-21
  Administered 2016-01-21: 10 mg via ORAL
  Filled 2016-01-21: qty 1

## 2016-01-21 MED ORDER — OXYCODONE HCL 5 MG PO TABS: 5.0000 mg | ORAL_TABLET | ORAL | Status: DC | PRN

## 2016-01-21 MED ORDER — METHOCARBAMOL 1000 MG/10ML IJ SOLN
500.0000 mg | Freq: Four times a day (QID) | INTRAVENOUS | Status: DC | PRN
  Filled 2016-01-21: qty 5

## 2016-01-21 MED ORDER — POTASSIUM CHLORIDE CRYS ER 20 MEQ PO TBCR
40.0000 meq | EXTENDED_RELEASE_TABLET | Freq: Two times a day (BID) | ORAL | Status: AC
  Administered 2016-01-21 (×2): 40 meq via ORAL
  Filled 2016-01-21 (×2): qty 2

## 2016-01-21 MED ORDER — PROMETHAZINE HCL 25 MG/ML IJ SOLN
12.5000 mg | Freq: Four times a day (QID) | INTRAMUSCULAR | Status: DC | PRN
  Administered 2016-01-21: 25 mg via INTRAVENOUS
  Filled 2016-01-21: qty 1

## 2016-01-21 MED ORDER — METHOCARBAMOL 500 MG PO TABS
500.0000 mg | ORAL_TABLET | Freq: Four times a day (QID) | ORAL | Status: DC | PRN
  Administered 2016-01-21 – 2016-01-22 (×4): 500 mg via ORAL
  Filled 2016-01-21 (×4): qty 1

## 2016-01-21 MED ORDER — SODIUM CHLORIDE 0.9 % IV SOLN: INTRAVENOUS | Status: DC

## 2016-01-21 MED ORDER — OXYCODONE HCL 5 MG PO TABS
5.0000 mg | ORAL_TABLET | ORAL | Status: DC | PRN
  Administered 2016-01-21: 5 mg via ORAL
  Administered 2016-01-21 – 2016-01-22 (×2): 15 mg via ORAL
  Filled 2016-01-21: qty 3
  Filled 2016-01-21: qty 1
  Filled 2016-01-21: qty 3

## 2016-01-21 MED ORDER — ACETAMINOPHEN 325 MG PO TABS
650.0000 mg | ORAL_TABLET | Freq: Four times a day (QID) | ORAL | Status: DC | PRN
  Administered 2016-01-22 – 2016-01-23 (×2): 650 mg via ORAL
  Filled 2016-01-21 (×2): qty 2

## 2016-01-21 MED ORDER — ASPIRIN EC 325 MG PO TBEC
325.0000 mg | DELAYED_RELEASE_TABLET | Freq: Two times a day (BID) | ORAL | Status: DC
  Administered 2016-01-22 – 2016-01-23 (×3): 325 mg via ORAL
  Filled 2016-01-21 (×3): qty 1

## 2016-01-21 MED ORDER — KETOROLAC TROMETHAMINE 15 MG/ML IJ SOLN
7.5000 mg | Freq: Four times a day (QID) | INTRAMUSCULAR | Status: DC | PRN
  Administered 2016-01-21 – 2016-01-22 (×4): 7.5 mg via INTRAVENOUS
  Filled 2016-01-21 (×4): qty 1

## 2016-01-21 MED ORDER — MORPHINE SULFATE (PF) 2 MG/ML IV SOLN
2.0000 mg | INTRAVENOUS | Status: DC | PRN
  Administered 2016-01-21 – 2016-01-22 (×5): 2 mg via INTRAVENOUS
  Filled 2016-01-21 (×5): qty 1

## 2016-01-21 MED ORDER — ACETAMINOPHEN 650 MG RE SUPP: 650.0000 mg | Freq: Four times a day (QID) | RECTAL | Status: DC | PRN

## 2016-01-21 NOTE — Progress Notes (Signed)
Subjective: Slow progress with therapy due to pain as well as weight bearing limitations.   Objective: Vital signs in last 24 hours: Temp:  [97.7 F (36.5 C)-98.7 F (37.1 C)] 97.7 F (36.5 C) (07/29 0601) Pulse Rate:  [63-89] 85 (07/29 0601) Resp:  [15-26] 18 (07/29 0601) BP: (109-143)/(58-105) 118/58 (07/29 0601) SpO2:  [80 %-100 %] 98 % (07/29 0601) Weight:  [127 kg (280 lb)] 127 kg (280 lb) (07/29 0140)  Intake/Output from previous day: 07/28 0701 - 07/29 0700 In: 360 [P.O.:360] Out: 175 [Urine:175] Intake/Output this shift: Total I/O In: 240 [P.O.:240] Out: 200 [Urine:200]   Recent Labs  01/20/16 2056 01/20/16 2127  HGB 12.1 14.3    Recent Labs  01/20/16 2056 01/20/16 2127  WBC 7.1  --   RBC 4.21  --   HCT 36.5 42.0  PLT 229  --     Recent Labs  01/20/16 2056 01/20/16 2127  NA 138 142  K 3.1* 3.1*  CL 109 106  CO2 22  --   BUN 11 10  CREATININE 0.73 0.70  GLUCOSE 110* 110*  CALCIUM 8.7*  --    No results for input(s): LABPT, INR in the last 72 hours.  Sensation intact distally Intact pulses distally Compartment soft  She can flex/ext toes. Pulses easily palpable and equal to her other side.  Assessment/Plan: Acute left knee dislocation post closed reduction 1)  Will need hinged knee brace locked at full extension (Bledsoe) 2)  Will obtain MRI today to assess ligaments. 3)  Give Kdur to treat hypokalemia 4)  Make inpatient admission   Kathryne Hitch 01/21/2016, 9:53 AM

## 2016-01-21 NOTE — Progress Notes (Signed)
Confirmed with Dr Magnus Ivan via phone that brace may come off for MRI once pt is settled on table & leg supported. Brenda Tyler, [employer]

## 2016-01-21 NOTE — Evaluation (Signed)
Physical Therapy Evaluation Patient Details Name: Brenda Tyler MRN: [id number] DOB: [birth year] Today's Date: 01/21/2016   History of Present Illness  30 yo female adm after fall resulting in L knee dislocation; PMHx:  HIV, obesity  Clinical Impression  Pt will benefit from Pt to address deficits below; She is limited by pain, NWB--may be able to incr WBing after MRI reviewed per MD; will continue to follow; pt may need amb transport home as she reports she plans to D/C to 2nd floor apt (may depend on WBing status as well)    Follow Up Recommendations Home health PT;Supervision for mobility/OOB    Equipment Recommendations  Rolling walker with 5" wheels (wide RW)    Recommendations for Other Services       Precautions / Restrictions Precautions Precautions: Fall Required Braces or Orthoses: Knee Immobilizer - Left;Other Brace/Splint Knee Immobilizer - Left: On at all times Other Brace/Splint: Bledsoe brace locked full extension/0* ordered Restrictions Weight Bearing Restrictions: Yes LLE Weight Bearing: Non weight bearing      Mobility  Bed Mobility Overal bed mobility: Needs Assistance Bed Mobility: Supine to Sit     Supine to sit: Min assist     General bed mobility comments: incr time, assist with LLE  Transfers Overall transfer level: Needs assistance Equipment used: Rolling walker (2 wheeled) Transfers: Sit to/from [employer] Sit to Stand: +2 safety/equipment;Mod assist;Min assist Stand pivot transfers: Min assist;Mod assist;+2 safety/equipment       General transfer comment: cues for hand placement, NWB; requires incr time  Ambulation/Gait                Stairs            Wheelchair Mobility    Modified Rankin (Stroke Patients Only)       Balance Overall balance assessment: Needs assistance;History of Falls Sitting-balance support: Bilateral upper extremity supported;Feet supported Sitting balance-Leahy Scale:  Fair       Standing balance-Leahy Scale: Poor Standing balance comment: reliant on UEs                              Pertinent Vitals/Pain Pain Assessment: 0-10 Pain Score: 10-Worst pain ever Pain Location: Left knee Pain Descriptors / Indicators: Constant Pain Intervention(s): Limited activity within patient's tolerance;Monitored during session;Premedicated before session;Repositioned;Ice applied    Home Living Family/patient expects to be discharged to:: Private residence Living Arrangements: Spouse/significant other Available Help at Discharge: Friend(s) Type of Home: Apartment Home Access: Stairs to enter   Secretary/administrator of Steps: flight Home Layout: One level Home Equipment: None Additional Comments: pt works in the [employer]    Prior Function Level of Independence: Independent               Higher education careers adviser        Extremity/Trunk Assessment   Upper Extremity Assessment: Defer to OT evaluation           Lower Extremity Assessment: LLE deficits/detail   LLE Deficits / Details: intact to light touch; ankle with mild edema, painful to palpation lateral ankle; pt unable to df-- however ? if more d/t pain than weakness     Communication   Communication: No difficulties  Cognition Arousal/Alertness: Awake/alert Behavior During Therapy: WFL for tasks assessed/performed Overall Cognitive Status: Within Functional Limits for tasks assessed  General Comments      Exercises        Assessment/Plan    PT Assessment Patient needs continued PT services  PT Diagnosis Difficulty walking;Acute pain   PT Problem List Decreased strength;Decreased mobility;Decreased activity tolerance;Decreased balance  PT Treatment Interventions DME instruction;Gait training;Functional mobility training;Stair training;Therapeutic exercise;Therapeutic activities   PT Goals (Current goals can be found in the  Care Plan section) Acute Rehab PT Goals Patient Stated Goal: back to work PT Goal Formulation: With patient Time For Goal Achievement: 01/28/16 Potential to Achieve Goals: Good    Frequency 7X/week   Barriers to discharge        Co-evaluation               End of Session Equipment Utilized During Treatment: Gait belt Activity Tolerance: Patient tolerated treatment well Patient left: in chair;with call bell/phone within reach;with chair alarm set           Time: [phone] PT Time Calculation (min) (ACUTE ONLY): 35 min   Charges:   PT Evaluation $PT Eval Low Complexity: 1 Procedure PT Treatments $Gait Training: 8-22 mins   PT G Codes:        Mancil Pfenning 01/24/16, 10:31 AM

## 2016-01-21 NOTE — H&P (Signed)
Brenda Tyler is an 30 y.o. female.   Chief Complaint:   Left knee pain; known left knee dislocation HPI:   30 yo female who fell off of a curb injuring her left knee.  She was brought to the ED with an obvious left knee deformity and was found to have an anterior dislocation of her knee joint.  The dislocation was reduced by the ED staff and she was placed in a splint.  A CTA was then obtained to r/o a vascular injury.  Past Medical History:  Diagnosis Date  . HIV (human immunodeficiency virus infection) ([place])   . HSV (herpes simplex virus) infection   . Hypertension   . Obesity     Past Surgical History:  Procedure Laterality Date  . INCISION AND DRAINAGE ABSCESS     on abdomen and buttocks    Family History  Problem Relation Age of Onset  . Heart disease Mother   . Hypertension Maternal Grandmother   . Diabetes Maternal Grandmother   . Heart disease Maternal Grandmother   . Cancer Maternal Grandmother   . Hypertension Paternal Grandmother   . Diabetes Paternal Grandmother   . Heart disease Paternal Grandmother    Social History:  reports that she has never smoked. She has never used smokeless tobacco. She reports that she drinks about 1.2 oz of alcohol per week . She reports that she does not use drugs.  Allergies:  Allergies  Allergen Reactions  . Dilaudid [Hydromorphone Hcl] Other (See Comments)    Makes her dizzy     (Not in a hospital admission)  Results for orders placed or performed during the hospital encounter of 01/20/16 (from the past 48 hour(s))  CBC with Differential/Platelet     Status: Abnormal   Collection Time: 01/20/16  8:56 PM  Result Value Ref Range   WBC 7.1 4.0 - 10.5 K/uL   RBC 4.21 3.87 - 5.11 MIL/uL   Hemoglobin 12.1 12.0 - 15.0 g/dL   HCT 36.5 36.0 - 46.0 %   MCV 86.7 78.0 - 100.0 fL   MCH 28.7 26.0 - 34.0 pg   MCHC 33.2 30.0 - 36.0 g/dL   RDW 12.9 11.5 - 15.5 %   Platelets 229 150 - 400 K/uL   Neutrophils Relative % 84 %   Neutro  Abs 5.9 1.7 - 7.7 K/uL   Lymphocytes Relative 7 %   Lymphs Abs 0.5 (L) 0.7 - 4.0 K/uL   Monocytes Relative 8 %   Monocytes Absolute 0.6 0.1 - 1.0 K/uL   Eosinophils Relative 1 %   Eosinophils Absolute 0.1 0.0 - 0.7 K/uL   Basophils Relative 0 %   Basophils Absolute 0.0 0.0 - 0.1 K/uL  Basic metabolic panel     Status: Abnormal   Collection Time: 01/20/16  8:56 PM  Result Value Ref Range   Sodium 138 135 - 145 mmol/L   Potassium 3.1 (L) 3.5 - 5.1 mmol/L   Chloride 109 101 - 111 mmol/L   CO2 22 22 - 32 mmol/L   Glucose, Bld 110 (H) 65 - 99 mg/dL   BUN 11 6 - 20 mg/dL   Creatinine, Ser 0.73 0.44 - 1.00 mg/dL   Calcium 8.7 (L) 8.9 - 10.3 mg/dL   GFR calc non Af Amer >60 >60 mL/min   GFR calc Af Amer >60 >60 mL/min    Comment: (NOTE) The eGFR has been calculated using the CKD EPI equation. This calculation has not been validated in all clinical  situations. eGFR's persistently <60 mL/min signify possible Chronic Kidney Disease.    Anion gap 7 5 - 15  I-Stat Beta hCG blood, ED ([facility], [facility], [facility] only)     Status: None   Collection Time: 01/20/16  9:04 PM  Result Value Ref Range   I-stat hCG, quantitative <5.0 <5 mIU/mL   Comment 3            Comment:   GEST. AGE      CONC.  (mIU/mL)   <=1 WEEK        5 - 50     2 WEEKS       50 - 500     3 WEEKS       100 - 10,000     4 WEEKS     1,000 - 30,000        FEMALE AND NON-PREGNANT FEMALE:     LESS THAN 5 mIU/mL   I-stat chem 8, ed     Status: Abnormal   Collection Time: 01/20/16  9:27 PM  Result Value Ref Range   Sodium 142 135 - 145 mmol/L   Potassium 3.1 (L) 3.5 - 5.1 mmol/L   Chloride 106 101 - 111 mmol/L   BUN 10 6 - 20 mg/dL   Creatinine, Ser 0.70 0.44 - 1.00 mg/dL   Glucose, Bld 110 (H) 65 - 99 mg/dL   Calcium, Ion 1.06 (L) 1.13 - 1.30 mmol/L   TCO2 23 0 - 100 mmol/L   Hemoglobin 14.3 12.0 - 15.0 g/dL   HCT 42.0 36.0 - 46.0 %   Dg Ankle Complete Left  Result Date: 01/20/2016 CLINICAL DATA:  Recent fall with left ankle pain,  initial encounter EXAM: LEFT ANKLE COMPLETE - 3+ VIEW COMPARISON:  None. FINDINGS: There is no evidence of fracture, dislocation, or joint effusion. There is no evidence of arthropathy or other focal bone abnormality. Mild soft tissue swelling is seen. IMPRESSION: Mild soft tissue swelling is noted. No acute bony abnormality is noted. Electronically Signed   By: Inez [name] M.D.   On: 01/20/2016 20:04  Ct Angio Low Extrem Left W &/or Wo Contrast  Result Date: 01/20/2016 CLINICAL DATA:  Severe left knee pain after fall. Left knee dislocation. The knee has been relocated. EXAM: CT ANGIOGRAPHY OF THE left lowerEXTREMITY TECHNIQUE: Multidetector CT imaging of the left lowerwas performed using the standard protocol during bolus administration of intravenous contrast. Multiplanar CT image reconstructions and MIPs were obtained to evaluate the vascular anatomy. CONTRAST:  100 mL Isovue 3 7 Ing COMPARISON:  Left lower extremity radiographs from the same day. FINDINGS: The aortic bifurcation is visualized and normal. The iliac arteries are within normal limits. The superficial and deep femoral arteries are intact. The popliteal artery is within normal limits. There is no evidence for injury related to the dislocation. Distal trifurcation is intact. The left knee is located. A joint effusion is present. A minimally displaced fibular head fracture is again noted. No other fractures are present. The hip and ankle joints are located and within normal limits. The visualized pelvis is unremarkable. Review of the MIP images confirms the above findings. IMPRESSION: 1. No vascular injury associated with dislocation. Specifically, the popliteal artery is intact. 2. Minimally displaced left fibular head fracture is confirmed. 3. The knee is located without other fracture. Electronically Signed   By: San Morelle M.D.   On: 01/20/2016 22:18  Dg Knee Complete 4 Views Left  Result Date: 01/20/2016 CLINICAL DATA:  Fall  while stepping over 6 inch recurred. Left knee pain. 10/10 pain. Initial encounter. EXAM: LEFT KNEE - COMPLETE 4+ VIEW COMPARISON:  None. FINDINGS: Left knee is dislocated anteriorly. There is a large joint effusion. A minimally displaced fibular head fracture is present. No other fracture is evident. The dislocation is also displaced medially. IMPRESSION: 1. Anterior knee dislocation with large intra-articular effusion. 2. Minimally displaced fracture at the left fibular head. Electronically Signed   By: San Morelle M.D.   On: 01/20/2016 20:04  Dg Knee Left Port  Result Date: 01/20/2016 CLINICAL DATA:  30 year old female- post reduction of knee dislocation. EXAM: PORTABLE LEFT KNEE - 1-2 VIEW COMPARISON:  Film earlier this day FINDINGS: Single lateral view of the left knee demonstrates no evidence of dislocation on the lateral view. Anterior soft tissue swelling is noted. IMPRESSION: Relocation of the left knee on lateral view. Electronically Signed   By: Margarette Canada M.D.   On: 01/20/2016 21:07  Dg Hip Unilat W Or Wo Pelvis 2-3 Views Left  Result Date: 01/20/2016 CLINICAL DATA:  Patient arrives to [facility] via [facility] after suffering a fall while ambulating over a 6" curb. She now complains of left leg pain which she localizes to her knee and ankle. EMS applied a leg splint, placed her on a titan tarp and transported to ED. Endorses pain as a 10/10. EXAM: DG HIP (WITH OR WITHOUT PELVIS) 2-3V LEFT COMPARISON:  CT, 04/09/2014 FINDINGS: No fracture.  No bone lesion. Hip joints and SI joints as well the symphysis pubis I normally spaced and aligned. Soft tissues are unremarkable. IMPRESSION: Negative. Electronically Signed   By: Lajean Manes M.D.   On: 01/20/2016 20:04   Review of Systems  Musculoskeletal: Positive for joint pain.  All other systems reviewed and are negative.   Blood pressure 126/83, pulse 79, temperature 98.7 F (37.1 C), temperature source Oral, resp. rate 18, last  menstrual period 06/25/2008, SpO2 95 %. Physical Exam  Constitutional: She is oriented to person, place, and time. She appears well-developed and well-nourished.  HENT:  Head: Normocephalic and atraumatic.  Eyes: EOM are normal. Pupils are equal, round, and reactive to light.  Neck: Normal range of motion. Neck supple.  Cardiovascular: Normal rate and regular rhythm.   Respiratory: Effort normal and breath sounds normal.  GI: Soft. Bowel sounds are normal.  Musculoskeletal:       Left knee: She exhibits decreased range of motion, swelling, effusion and ecchymosis. Tenderness found. Medial joint line, lateral joint line, MCL and LCL tenderness noted.  Neurological: She is alert and oriented to person, place, and time.  Skin: Skin is warm and dry.  Psychiatric: She has a normal mood and affect.    Her left calf is soft and she has palpable pulses in her foot. She has some decreased sensation over her left foot and very week attempt to dorsiflex her ankle   Assessment/Plan Left knee traumatic anterior dislocation post-mechanical fall; reduced by ED staff 1)  Her CT Angiogram showed no obvious gross vascular injury.  I remove her splint and placed her in a knee immobilizer.  She will need to be admitted for at least observation to continue to assess her neurovascular status.  She will also need PT for mobility with only no weight for now on her left leg.  She will need a left knee MRI as well as a bledsoe brace locked at full extension.  Mcarthur Rossetti, MD 01/21/2016, 12:44 AM

## 2016-01-22 LAB — CBC
HEMATOCRIT: 34.6 % — AB (ref 36.0–46.0)
HEMOGLOBIN: 11.4 g/dL — AB (ref 12.0–15.0)
MCH: 28.7 pg (ref 26.0–34.0)
MCHC: 32.9 g/dL (ref 30.0–36.0)
MCV: 87.2 fL (ref 78.0–100.0)
Platelets: 210 10*3/uL (ref 150–400)
RBC: 3.97 MIL/uL (ref 3.87–5.11)
RDW: 13.4 % (ref 11.5–15.5)
WBC: 5.2 10*3/uL (ref 4.0–10.5)

## 2016-01-22 NOTE — Progress Notes (Signed)
Pt temp 102.6. Cool cloth applied to head, Tylenol 650mg  po given per orders. Will recheck. Clydie Braun, RN 01/22/16

## 2016-01-22 NOTE — Progress Notes (Signed)
Physical Therapy Treatment Patient Details Name: Brenda Tyler MRN: [id number] DOB: Jun 05, 1986 Today's Date: 01/22/2016    History of Present Illness 30 yo female adm after fall resulting in L knee dislocation; PMHx:  HIV, obesity    PT Comments    Pt progressing slowly d/t pain; had all allowable meds during/before PT session. Answered questions and Discussed with pt  The need for eventual knee surgery and the current damage to her knee and resulting pain that is now her most limiting factor. Will attempt further mobility in pm;   **Pt is very concerned about her job situation and providing for her kids--really needs a SW Consult to provide her with more info regarding potential for disability, resources etc;**   Follow Up Recommendations  Home health PT;Supervision for mobility/OOB     Equipment Recommendations  Rolling walker with 5" wheels;3in1 (PT) (wide walker)    Recommendations for Other Services       Precautions / Restrictions Precautions Precautions: Fall Precaution Comments: NO ROM L KNEE;  Required Braces or Orthoses: Other Brace/Splint Knee Immobilizer - Left: On at all times Other Brace/Splint: Bledsoe brace locked full extension/0* Restrictions Weight Bearing Restrictions: Yes LLE Weight Bearing: Partial weight bearing LLE Partial Weight Bearing Percentage or Pounds: 50    Mobility  Bed Mobility Overal bed mobility: Needs Assistance Bed Mobility: Supine to Sit     Supine to sit: Min assist     General bed mobility comments: incr time, assist with LLE; pt instructed in sheet loop/(used gait belt) as leg lifter to assist in bringing  LLE off EOB  Transfers Overall transfer level: Needs assistance Equipment used: Rolling walker (2 wheeled) Transfers: Sit to/from Stand Sit to Stand: [employer] safety/equipment Stand pivot transfers: Min assist;Mod assist;+2 safety/equipment       General transfer comment: cues for hand placement; requires incr  time  Ambulation/Gait Ambulation/Gait assistance: Min assist;+2 safety/equipment Ambulation Distance (Feet): 3 Feet Assistive device: Rolling walker (2 wheeled) Gait Pattern/deviations: Step-to pattern;Decreased weight shift to left     General Gait Details: pt with limited ability to wt shfit on to LLE d/t pain;  able to take a few small steps pivoting to chair   Stairs            Wheelchair Mobility    Modified Rankin (Stroke Patients Only)       Balance             Standing balance-Leahy Scale: Poor Standing balance comment: reliant on UEs                    Cognition Arousal/Alertness: Awake/alert Behavior During Therapy: WFL for tasks assessed/performed Overall Cognitive Status: Within Functional Limits for tasks assessed                      Exercises      General Comments        Pertinent Vitals/Pain Pain Assessment: Faces Faces Pain Scale: Hurts worst Pain Location: Left knee Pain Descriptors / Indicators: Constant;Squeezing;Grimacing;Guarding;Crying Pain Intervention(s): Limited activity within patient's tolerance;Monitored during session;Premedicated before session;RN gave pain meds during session;Ice applied;Repositioned;Utilized relaxation techniques    Home Living                      Prior Function            PT Goals (current goals can now be found in the care plan section) Acute Rehab PT Goals Patient Stated  Goal: back to work PT Goal Formulation: With patient Time For Goal Achievement: 01/28/16 Potential to Achieve Goals: Good Progress towards PT goals: Not progressing toward goals - comment (d/t pain)    Frequency  7X/week    PT Plan Current plan remains appropriate    Co-evaluation             End of Session Equipment Utilized During Treatment: Other (comment);Gait belt (bledsoe brace) Activity Tolerance: Patient limited by pain Patient left: in chair;with call bell/phone within  reach;with chair alarm set;with family/visitor present     Time: 3154-0086 PT Time Calculation (min) (ACUTE ONLY): 23 min  Charges:  $Therapeutic Activity: 23-37 mins                    G Codes:      Kortny Lirette 01-27-16, 12:52 PM

## 2016-01-22 NOTE — Progress Notes (Signed)
Attempted to place foot pumps on patient. The manner in which they suddenly inflate causes her leg to jerk, thus results in excruciating pain. Foot pumps stopped. Patient stated she doesn't "want to refuse them because they are important and I don't want clots." This RN dicussed other options, including injections. Patient seemed agreeable.

## 2016-01-22 NOTE — Progress Notes (Signed)
   01/22/16 1400  PT Visit Information  Last PT Received On 01/22/16---Needs SW consult/multiple questions regarding disability, etc; may need non-emergent ambulance transport home depending on D/C plan; will continue to follow  Assistance Needed +2 (safety)  History of Present Illness 30 yo female adm after fall resulting in L knee dislocation; PMHx:  HIV, obesity  Subjective Data  Subjective It just hurts so much  Patient Stated Goal back to work  Precautions  Precautions Fall  Precaution Comments NO ROM L KNEE;   Required Braces or Orthoses Other Brace/Splint  Other Brace/Splint Bledsoe brace locked full extension/0*  Restrictions  LLE Weight Bearing PWB  LLE Partial Weight Bearing Percentage or Pounds 50  Pain Assessment  Pain Assessment Faces  Faces Pain Scale 2  Pain Location Left knee  Pain Descriptors / Indicators Grimacing;Guarding  Pain Intervention(s) Limited activity within patient's tolerance;Monitored during session;Ice applied  Cognition  Arousal/Alertness Awake/alert  Behavior During Therapy WFL for tasks assessed/performed  Overall Cognitive Status Within Functional Limits for tasks assessed  Bed Mobility  General bed mobility comments (in chair)  Transfers  Overall transfer level Needs assistance  Equipment used Rolling walker (2 wheeled)  Transfers Sit to/from Stand  Sit to Stand Min assist;+2 safety/equipment  General transfer comment cues for hand placement; requires incr time  Ambulation/Gait  Ambulation/Gait assistance Min assist;+2 safety/equipment  Ambulation Distance (Feet) 12 Feet  Assistive device Rolling walker (2 wheeled)  Gait Pattern/deviations Step-to pattern;Antalgic;Decreased weight shift to left;Wide base of support  General Gait Details verbal cues for sequence, RW safety, use of UEs/PWB   PT - End of Session  Equipment Utilized During Treatment Gait belt;Other (comment) (Bledsoe brace)  Activity Tolerance Patient tolerated treatment  well  Patient left in chair;with call bell/phone within reach;with chair alarm set;with family/visitor present  PT - Assessment/Plan  PT Plan Current plan remains appropriate  PT Frequency (ACUTE ONLY) 7X/week  Follow Up Recommendations Home health PT;Supervision for mobility/OOB  PT equipment Rolling walker with 5" wheels;3in1 (PT) (Wide/ ??w/c, ?leg lifter)  PT Goal Progression  Progress towards PT goals Progressing toward goals  Acute Rehab PT Goals  PT Goal Formulation With patient  Time For Goal Achievement 01/28/16  Potential to Achieve Goals Good  PT Time Calculation  PT Start Time (ACUTE ONLY) 1325  PT Stop Time (ACUTE ONLY) 1349  PT Time Calculation (min) (ACUTE ONLY) 24 min  PT General Charges  $$ ACUTE PT VISIT 1 Procedure  PT Treatments  $Gait Training 23-37 mins

## 2016-01-22 NOTE — Progress Notes (Signed)
   01/22/16 1500  PT Visit Information  Last PT Received On 01/22/16  Further discussion with pt regarding follow up, likely surgery, LOS this adm; pt continues to ask to see SW  Assistance Needed +1  History of Present Illness 30 yo female adm after fall resulting in L knee dislocation; PMHx:  HIV, obesity  Precautions  Precautions Fall  Precaution Comments NO ROM L KNEE;   Required Braces or Orthoses Other Brace/Splint  Other Brace/Splint Bledsoe brace locked full extension/0*--on at all times  Restrictions  LLE Weight Bearing PWB  LLE Partial Weight Bearing Percentage or Pounds 50  Pain Assessment  Pain Assessment Faces  Faces Pain Scale 2  Pain Location Left knee  Pain Descriptors / Indicators Grimacing;Guarding  Pain Intervention(s) Limited activity within patient's tolerance;Monitored during session;Premedicated before session;Patient requesting pain meds-RN notified;Repositioned  Cognition  Arousal/Alertness Awake/alert  Behavior During Therapy WFL for tasks assessed/performed  Overall Cognitive Status Within Functional Limits for tasks assessed  Bed Mobility  Overal bed mobility Needs Assistance  Bed Mobility Sit to Supine  Sit to supine Min assist  General bed mobility comments incr time, assist with LLE;   Transfers  Overall transfer level Needs assistance  Equipment used Rolling walker (2 wheeled)  Transfers Sit to/from [employer] Transfers  Sit to Stand Min assist  Stand pivot transfers Min assist;Min guard  General transfer comment cues for hand placement, LLE management, RW position; requires incr time  PT - End of Session  Equipment Utilized During Treatment Gait belt;Other (comment) (bledsoe brace)  Activity Tolerance Patient tolerated treatment well  Patient left in bed;with call bell/phone within reach;with bed alarm set;with nursing/sitter in room  PT - Assessment/Plan  PT Plan Current plan remains appropriate  PT Frequency (ACUTE ONLY) 7X/week   Follow Up Recommendations Home health PT;Supervision for mobility/OOB  PT equipment Rolling walker with 5" wheels;3in1 (PT) (?w/c--no ins)  PT Goal Progression  Progress towards PT goals Progressing toward goals  Acute Rehab PT Goals  PT Goal Formulation With patient  Time For Goal Achievement 01/28/16  Potential to Achieve Goals Good  PT Time Calculation  PT Start Time (ACUTE ONLY) 1508  PT Stop Time (ACUTE ONLY) 1517  PT Time Calculation (min) (ACUTE ONLY) 9 min  PT General Charges  $$ ACUTE PT VISIT 1 Procedure  PT Treatments  $Therapeutic Activity 8-22 mins

## 2016-01-22 NOTE — Progress Notes (Signed)
Temperature recheck 101.7. Cool cloth to head. IS encouraged every hour while awake. Will continue to monitor. Clydie Braun, RN 01/22/16

## 2016-01-22 NOTE — Progress Notes (Signed)
Patient ID: Brenda Tyler, female   DOB: 1985/12/23, 30 y.o.   MRN: [id number] MRI shows complex ligamentous disruption of her left knee.  It remains well-located.  Her ACL, PCL, and posterior-lateral corner structures are all torn.  She is in a Bledsoe knee brace locked at full extension.  Will be on aspirin 325 mg BID for DVT coverage.  I explained to her the extent of her injury and am not sure she fully understands.  It may be difficult to find anyone to reconstruct her left knee due to her non-compliance with her HIV meds.  For now, will increase her weight bearing to 50% on her left knee.  Will discharge when cleared by therapy and safe to go home.

## 2016-01-23 ENCOUNTER — Telehealth: Payer: Self-pay

## 2016-01-23 LAB — BASIC METABOLIC PANEL
ANION GAP: 8 (ref 5–15)
BUN: 13 mg/dL (ref 6–20)
CALCIUM: 8.7 mg/dL — AB (ref 8.9–10.3)
CO2: 22 mmol/L (ref 22–32)
Chloride: 108 mmol/L (ref 101–111)
Creatinine, Ser: 0.91 mg/dL (ref 0.44–1.00)
GFR calc Af Amer: >60 mL/min (ref >60)
GFR calc non Af Amer: >60 mL/min (ref >60)
GLUCOSE: 124 mg/dL — AB (ref 65–99)
POTASSIUM: 4.9 mmol/L (ref 3.5–5.1)
Sodium: 138 mmol/L (ref 135–145)

## 2016-01-23 MED ORDER — OXYCODONE-ACETAMINOPHEN 5-325 MG PO TABS: 1.0000 | ORAL_TABLET | ORAL | 0 refills | Status: DC | PRN

## 2016-01-23 MED ORDER — PROMETHAZINE HCL 12.5 MG PO TABS: 12.5000 mg | ORAL_TABLET | Freq: Four times a day (QID) | ORAL | 0 refills | Status: DC | PRN

## 2016-01-23 MED ORDER — TIZANIDINE HCL 4 MG PO TABS: 4.0000 mg | ORAL_TABLET | Freq: Four times a day (QID) | ORAL | 0 refills | Status: DC | PRN

## 2016-01-23 MED ORDER — ASPIRIN 325 MG PO TBEC: 325.0000 mg | DELAYED_RELEASE_TABLET | Freq: Two times a day (BID) | ORAL | 0 refills | Status: DC

## 2016-01-23 NOTE — Progress Notes (Signed)
Physical Therapy Treatment Patient Details Name: TEJASVI BRISSETT MRN: [id number] DOB: [birth year] Today's Date: 01/23/2016    History of Present Illness 30 yo female adm after fall resulting in L knee dislocation; PMHx:  HIV, obesity    PT Comments    Extensive time required to assess DME needs for DC, patient reports no  Bed or couch available. Also apt on second floor(12) steps. Patient is  Minimally ambulatory at this time. Reviewed position of the Bledsoe brace, to keep snug when mobilizing. Provided crutches if she gets to point that she can negotiate steps . Provided with "hip kit" with AE for more independence and leg lifter to self move the left leg.  Follow Up Recommendations  Home health PT;Supervision for mobility/OOB     Equipment Recommendations  Rolling walker with 5" wheels;Crutches;Hospital bed (patient has not bed  or couch )    Recommendations for Other Services       Precautions / Restrictions Precautions Precautions: Fall Precaution Comments: NO ROM L KNEE;  Required Braces or Orthoses: Other Brace/Splint Knee Immobilizer - Left: On at all times Other Brace/Splint: Bledsoe brace locked full extension/0*--on at all times    Mobility  Bed Mobility   Bed Mobility: Supine to Sit;Sit to Supine     Supine to sit: Min assist Sit to supine: Min assist   General bed mobility comments: with use of the leg lifter, instructed in it's use. Patient demonstrated back  Transfers Overall transfer level: Needs assistance Equipment used: Rolling walker (2 wheeled) Transfers: Sit to/from Stand Sit to Stand: Min assist;From elevated surface         General transfer comment: cues for hand placement, LLE management, RW position; requires incr time  Ambulation/Gait                 Stairs            Wheelchair Mobility    Modified Rankin (Stroke Patients Only)       Balance                                    Cognition  Arousal/Alertness: Awake/alert                          Exercises      General Comments        Pertinent Vitals/Pain Faces Pain Scale: Hurts whole lot Pain Location: Left knee and pretibial Pain Descriptors / Indicators: Discomfort;Grimacing;Guarding Pain Intervention(s): Limited activity within patient's tolerance;Monitored during session;Premedicated before session    Home Living                      Prior Function            PT Goals (current goals can now be found in the care plan section) Progress towards PT goals: Progressing toward goals    Frequency  7X/week    PT Plan Current plan remains appropriate    Co-evaluation             End of Session   Activity Tolerance: Patient limited by pain Patient left: in bed;with call bell/phone within reach     Time: 1442-1520 PT Time Calculation (min) (ACUTE ONLY): 38 min  Charges:  $Therapeutic Activity: 8-22 mins $Self Care/Home Management: 23-37  G Codes:      Claretha Cooper 01/23/2016, 3:23 PM Tresa Endo PT [phone]

## 2016-01-23 NOTE — Discharge Instructions (Signed)
You may put half of your weight on your left knee. Keep your brace on at all times.

## 2016-01-23 NOTE — Care Management Note (Signed)
Case Management Note  Patient Details  Name: Brenda Tyler MRN: [id number] Date of Birth: Apr 07, 1986  Subjective/Objective: 30 y/o f admitted w/L knee dislocation. S/p surgery. From home w/kids(minors). PT-recc HHPT. No health insurance-HHRN/HH social worker-can be provided. Patient chose [facility]-rep Clydie Braun accepted referral, aware of d/c today, & HHC orders.Patient also recc for-hospital bed,wide rw,wide [id number]-[facility] dme rep [name] aware of d/c & dme orders.  Patient will stay @:Boyfriend's address:[name], [place] [address]-patient's cell#[phone]. Will need [facility] for transport-form in shadow chart. Nsg will call fro [facility] once bed delivered to home. PCP appt set w/[facility]-see d/c instruction section.                  Action/Plan:d/c home w/HHC/DME/[facility] ambulance   Expected Discharge Date:                  Expected Discharge Plan:  Home w Home Health Services  In-House Referral:     Discharge planning Services  CM Consult  Post Acute Care Choice:    Choice offered to:  Patient  DME Arranged:  3-N-1, Walker wide, Hospital bed DME Agency:  [facility]  HH Arranged:  RN, Social Work [facility] Agency:     Status of Service:     If discussed at [employer], dates discussed:    Additional Comments:  Lanier Clam, RN 01/23/2016, 11:52 AM

## 2016-01-23 NOTE — Progress Notes (Signed)
[facility]  Patient Status: New  [facility] is providing the following services: RN and MSW. [name] does not qualify for PT services based on Medicaid guidelines used for [employer] patients.  If patient discharges after hours, please call [phone].   Brenda Tyler 01/23/2016, 3:43 PM

## 2016-01-23 NOTE — Progress Notes (Signed)
Patient ID: Brenda Tyler, female   DOB: January 31, 1986, 30 y.o.   MRN: [id number] No acute changes.  Left calf soft and foot well-perfused.  Bledsoe brace on and locked at full extension.  Will discharge to home today.

## 2016-01-23 NOTE — Progress Notes (Signed)
CSW consulted to assist with SSD . CSW has contacted Artist and FC will speak with pt today. CSW signing off.  Cori Razor LCSW [phone]

## 2016-01-23 NOTE — Discharge Summary (Signed)
Patient ID: Brenda Tyler MRN: [id number] DOB/AGE: 31-Sep-1987 31 y.o.  Admit date: 01/20/2016 Discharge date: 01/23/2016  Admission Diagnoses:  Principal Problem:   Dislocation of knee, anterior, left, closed Active Problems:   Closed anterior dislocation of left knee   Discharge Diagnoses:  Same  Past Medical History:  Diagnosis Date  . HIV (human immunodeficiency virus infection) (HCC)   . HSV (herpes simplex virus) infection   . Hypertension   . Obesity     Surgeries:  on    Consultants: Treatment Team:  Kathryne Hitch, MD  Discharged Condition: Improved  Hospital Course: Brenda Tyler is an 30 y.o. female who was admitted 01/20/2016 for operative treatment ofDislocation of knee, anterior, left, closed. Patient has severe unremitting pain that affects sleep, daily activities, and work/hobbies. After pre-op clearance the patient was taken to the operating room on  and underwent  .    Patient was given perioperative antibiotics: Anti-infectives    Start     Dose/Rate Route Frequency Ordered Stop   01/21/16 0800  elvitegravir-cobicistat-emtricitabine-tenofovir (GENVOYA) 150-150-200-10 MG tablet 1 tablet     1 tablet Oral Daily with breakfast 01/21/16 0148         Patient was given sequential compression devices, early ambulation, and chemoprophylaxis to prevent DVT.  Patient benefited maximally from hospital stay and there were no complications.    Recent vital signs: Patient Vitals for the past 24 hrs:  BP Temp Temp src Pulse Resp SpO2  01/23/16 0624 121/82 99.6 F (37.6 C) Oral 84 16 98 %  01/22/16 2052 114/77 97.9 F (36.6 C) Oral 67 16 100 %  01/22/16 1500 96/65 98.7 F (37.1 C) Oral 89 16 99 %  01/22/16 1313 120/80 98.3 F (36.8 C) Oral 98 20 -     Recent laboratory studies:  Recent Labs  01/20/16 2056 01/20/16 2127 01/22/16 0441  WBC 7.1  --  5.2  HGB 12.1 14.3 11.4*  HCT 36.5 42.0 34.6*  PLT 229  --  210  NA 138 142 138  K 3.1* 3.1*  4.9  CL 109 106 108  CO2 22  --  22  BUN 11 10 13   CREATININE 0.73 0.70 0.91  GLUCOSE 110* 110* 124*  CALCIUM 8.7*  --  8.7*     Discharge Medications:     Medication List    TAKE these medications   acyclovir ointment 5 % Commonly known as:  ZOVIRAX Apply topically 5 (five) times daily.   albuterol 108 (90 Base) MCG/ACT inhaler Commonly known as:  PROVENTIL HFA;VENTOLIN HFA Inhale 1-2 puffs into the lungs every 6 (six) hours as needed for wheezing or shortness of breath.   aspirin 325 MG EC tablet Take 1 tablet (325 mg total) by mouth 2 (two) times daily.   Cetirizine HCl 10 MG Caps Take 1 capsule (10 mg total) by mouth at bedtime as needed (congestion).   elvitegravir-cobicistat-emtricitabine-tenofovir 150-150-200-10 MG Tabs tablet Commonly known as:  GENVOYA Take 1 tablet by mouth daily with breakfast.   oxyCODONE-acetaminophen 5-325 MG tablet Commonly known as:  PERCOCET/ROXICET Take 1-2 tablets by mouth every 4 (four) hours as needed for moderate pain.   promethazine 12.5 MG tablet Commonly known as:  PHENERGAN Take 1 tablet (12.5 mg total) by mouth every 6 (six) hours as needed for nausea or vomiting.   tiZANidine 4 MG tablet Commonly known as:  ZANAFLEX Take 1 tablet (4 mg total) by mouth every 6 (six) hours as needed for muscle spasms.  Diagnostic Studies: Dg Ankle Complete Left  Result Date: 01/20/2016 CLINICAL DATA:  Recent fall with left ankle pain, initial encounter EXAM: LEFT ANKLE COMPLETE - 3+ VIEW COMPARISON:  None. FINDINGS: There is no evidence of fracture, dislocation, or joint effusion. There is no evidence of arthropathy or other focal bone abnormality. Mild soft tissue swelling is seen. IMPRESSION: Mild soft tissue swelling is noted. No acute bony abnormality is noted. Electronically Signed   By: Alcide Clever M.D.   On: 01/20/2016 20:04  Ct Angio Low Extrem Left W &/or Wo Contrast  Result Date: 01/20/2016 CLINICAL DATA:  Severe left  knee pain after fall. Left knee dislocation. The knee has been relocated. EXAM: CT ANGIOGRAPHY OF THE left lowerEXTREMITY TECHNIQUE: Multidetector CT imaging of the left lowerwas performed using the standard protocol during bolus administration of intravenous contrast. Multiplanar CT image reconstructions and MIPs were obtained to evaluate the vascular anatomy. CONTRAST:  100 mL Isovue 3 7 Ing COMPARISON:  Left lower extremity radiographs from the same day. FINDINGS: The aortic bifurcation is visualized and normal. The iliac arteries are within normal limits. The superficial and deep femoral arteries are intact. The popliteal artery is within normal limits. There is no evidence for injury related to the dislocation. Distal trifurcation is intact. The left knee is located. A joint effusion is present. A minimally displaced fibular head fracture is again noted. No other fractures are present. The hip and ankle joints are located and within normal limits. The visualized pelvis is unremarkable. Review of the MIP images confirms the above findings. IMPRESSION: 1. No vascular injury associated with dislocation. Specifically, the popliteal artery is intact. 2. Minimally displaced left fibular head fracture is confirmed. 3. The knee is located without other fracture. Electronically Signed   By: Marin Roberts M.D.   On: 01/20/2016 22:18  Mr Knee Left  Wo Contrast  Result Date: 01/21/2016 CLINICAL DATA:  Larey Seat off of a curb yesterday and injured knee. Anterior dislocation. EXAM: MRI OF THE LEFT KNEE WITHOUT CONTRAST TECHNIQUE: Multiplanar, multisequence MR imaging of the knee was performed. No intravenous contrast was administered. COMPARISON:  CT scan 01/20/2016 and radiographs same date. FINDINGS: Examination is somewhat limited by body habitus. We are unable to use the dedicated knee coil and is also motion artifact. MENISCI Medial meniscus:  Intact.  Discoid morphology. Lateral meniscus:  Longitudinal tear  involving the posterior horn. LIGAMENTS Cruciates:  The ACL and PCL are ruptured. Collaterals: Medial collateral ligament sprain mainly anteriorly and superiorly. The fibular collateral ligament is completely torn. The iliotibial band is intact. The biceps femoris tendon is partially torn near its insertion site on the fibular head but does not completely ruptured. The popliteus tendon appears torn and I suspect the arcuate ligament is also torn. CARTILAGE Patellofemoral:  Intact. Medial:  Intact. Lateral:  Intact. Joint:  Large joint effusion. Popliteal Fossa: No Baker's cyst. There are muscle injuries involving the popliteus and gastrocs muscles due to the dislocation. Extensor Mechanism: The patella retinacular structures are intact and the quadriceps and patellar tendons are intact. Patellar plica are noted. Bones: Medial femoral bone contusion. No discrete fracture. No tibia or fibular fracture. Other: None IMPRESSION: 1. MR findings consistent with a posterolateral corner injury. There are tears of the lateral meniscus, fibular collateral ligament, popliteus tendon, arcuate ligament and partially torn biceps femoris tendon. 2. Completely torn ACL and PCL.  MCL sprain. 3. Discoid medial meniscus. 4. Posterior muscle injuries. Electronically Signed   By: Orlene Plum.D.  On: 01/21/2016 17:29  Dg Knee Complete 4 Views Left  Result Date: 01/20/2016 CLINICAL DATA:  Fall while stepping over 6 inch recurred. Left knee pain. 10/10 pain. Initial encounter. EXAM: LEFT KNEE - COMPLETE 4+ VIEW COMPARISON:  None. FINDINGS: Left knee is dislocated anteriorly. There is a large joint effusion. A minimally displaced fibular head fracture is present. No other fracture is evident. The dislocation is also displaced medially. IMPRESSION: 1. Anterior knee dislocation with large intra-articular effusion. 2. Minimally displaced fracture at the left fibular head. Electronically Signed   By: Marin Roberts M.D.   On:  01/20/2016 20:04  Dg Knee Left Port  Result Date: 01/20/2016 CLINICAL DATA:  30 year old female- post reduction of knee dislocation. EXAM: PORTABLE LEFT KNEE - 1-2 VIEW COMPARISON:  Film earlier this day FINDINGS: Single lateral view of the left knee demonstrates no evidence of dislocation on the lateral view. Anterior soft tissue swelling is noted. IMPRESSION: Relocation of the left knee on lateral view. Electronically Signed   By: Harmon Pier M.D.   On: 01/20/2016 21:07  Dg Hip Unilat W Or Wo Pelvis 2-3 Views Left  Result Date: 01/20/2016 CLINICAL DATA:  Patient arrives to [facility] via [facility] after suffering a fall while ambulating over a 6" curb. She now complains of left leg pain which she localizes to her knee and ankle. EMS applied a leg splint, placed her on a titan tarp and transported to ED. Endorses pain as a 10/10. EXAM: DG HIP (WITH OR WITHOUT PELVIS) 2-3V LEFT COMPARISON:  CT, 04/09/2014 FINDINGS: No fracture.  No bone lesion. Hip joints and SI joints as well the symphysis pubis I normally spaced and aligned. Soft tissues are unremarkable. IMPRESSION: Negative. Electronically Signed   By: Amie Portland M.D.   On: 01/20/2016 20:04   Disposition: 01-Home or Self Care  Discharge Instructions    Call MD / Call 911    Complete by:  As directed   If you experience chest pain or shortness of breath, CALL 911 and be transported to the hospital emergency room.  If you develope a fever above 101 F, pus (white drainage) or increased drainage or redness at the wound, or calf pain, call your surgeon's office.   Constipation Prevention    Complete by:  As directed   Drink plenty of fluids.  Prune juice may be helpful.  You may use a stool softener, such as Colace (over the counter) 100 mg twice a day.  Use MiraLax (over the counter) for constipation as needed.   Diet - low sodium heart healthy    Complete by:  As directed   Discharge patient    Complete by:  As directed   Increase activity  slowly as tolerated    Complete by:  As directed      Follow-up Information    Kathryne Hitch, MD. Schedule an appointment as soon as possible for a visit in 2 week(s).   Specialty:  Orthopedic Surgery Contact information: [address] [place] [address] [phone]        Kathryne Hitch, MD .   Specialty:  Orthopedic Surgery Contact information: [address] [place] [address] [phone]            Signed: Kathryne Hitch 01/23/2016, 7:33 AM

## 2016-01-23 NOTE — Care Management Note (Signed)
Case Management Note  Patient Details  Name: Brenda Tyler MRN: [id number] Date of Birth: [birth year]  Subjective/Objective:  MD has placed orders. [facility] DME rep [name] working on dme to be delivered to home, then patient can be d/c to home via [facility]-Nsg aware.                  Action/Plan:d/c home w/HHC/DME/PTAR   Expected Discharge Date:                  Expected Discharge Plan:  Home w Home Health Services  In-House Referral:     Discharge planning Services  CM Consult  Post Acute Care Choice:    Choice offered to:  Patient  DME Arranged:  3-N-1, Walker wide, Hospital bed DME Agency:  [facility]  HH Arranged:  RN, Social Work, PT [facility] Agency:     Status of Service:     If discussed at [employer], dates discussed:    Additional Comments:  Lanier Clam, RN 01/23/2016, 12:17 PM

## 2016-01-23 NOTE — Telephone Encounter (Signed)
Message received from Lanier Clam, RN CM requesting a hospital follow up appointment for the patient at [facility].  There are not any appointments available at this time; but the patient can call the clinic at any time to check for cancellations.  Update provided to K. Mahabir, RN CM.

## 2016-01-23 NOTE — Care Management Note (Signed)
Case Management Note  Patient Details  Name: Brenda Tyler MRN: [id number] Date of Birth: 1985/11/03  Subjective/Objective: Awaiting HHC/DME orders from MD-I have called tel#[phone]-office closed from 11:30-12:30p-will try again when office opened.Nsg also aware.                   Action/Plan:d/c home w/HHC/DME/PTAR   Expected Discharge Date:                  Expected Discharge Plan:  Home w Home Health Services  In-House Referral:     Discharge planning Services  CM Consult  Post Acute Care Choice:    Choice offered to:  Patient  DME Arranged:  3-N-1, Walker wide, Hospital bed DME Agency:  [facility]  HH Arranged:  RN, Social Work [facility] Agency:     Status of Service:     If discussed at [employer], dates discussed:    Additional Comments:  Lanier Clam, RN 01/23/2016, 11:58 AM

## 2016-03-01 ENCOUNTER — Ambulatory Visit: Payer: Self-pay | Admitting: Family Medicine

## 2016-04-04 ENCOUNTER — Ambulatory Visit (INDEPENDENT_AMBULATORY_CARE_PROVIDER_SITE_OTHER): Payer: Self-pay | Admitting: Orthopaedic Surgery

## 2016-04-24 ENCOUNTER — Encounter (INDEPENDENT_AMBULATORY_CARE_PROVIDER_SITE_OTHER): Payer: Self-pay | Admitting: Orthopaedic Surgery

## 2016-04-24 ENCOUNTER — Ambulatory Visit (INDEPENDENT_AMBULATORY_CARE_PROVIDER_SITE_OTHER): Payer: Self-pay

## 2016-04-24 ENCOUNTER — Ambulatory Visit (INDEPENDENT_AMBULATORY_CARE_PROVIDER_SITE_OTHER): Payer: Self-pay | Admitting: Orthopaedic Surgery

## 2016-04-24 DIAGNOSIS — M25562 Pain in left knee: Secondary | ICD-10-CM

## 2016-04-24 DIAGNOSIS — Z87828 Personal history of other (healed) physical injury and trauma: Secondary | ICD-10-CM

## 2016-04-24 DIAGNOSIS — Z8739 Personal history of other diseases of the musculoskeletal system and connective tissue: Secondary | ICD-10-CM

## 2016-04-24 DIAGNOSIS — G8929 Other chronic pain: Secondary | ICD-10-CM

## 2016-04-24 MED ORDER — METHYLPREDNISOLONE ACETATE 40 MG/ML IJ SUSP
40.0000 mg | INTRAMUSCULAR | Status: AC | PRN
  Administered 2016-04-24: 40 mg via INTRA_ARTICULAR

## 2016-04-24 MED ORDER — LIDOCAINE HCL 1 % IJ SOLN
1.0000 mL | INTRAMUSCULAR | Status: AC | PRN
  Administered 2016-04-24: 1 mL

## 2016-04-24 NOTE — Progress Notes (Signed)
Office Visit Note   Patient: Brenda Tyler           Date of Birth: 04/03/1986           MRN: [id number] Visit Date: 04/24/2016              Requested by: No referring provider defined for this encounter. PCP: No PCP Per Patient   Assessment & Plan: Visit Diagnoses:  1. Chronic pain of left knee   2. History of dislocation of knee     Plan: At this point I was able to successfully place a steroid injection in her left knee. I demonstrated knee bending and quad strengthening exercises that she has to try. We'll stop the Bledsoe knee brace at this point and try a smaller hinged knee brace that'll allow her to bend her knee. Once she is able to obtain any type of health insurance we can aggressively get her into physical therapy. We will see her back in 4 weeks to see how she is doing overall  Follow-Up Instructions: Return in about 4 weeks (around 05/22/2016).   Orders:  Orders Placed This Encounter  Procedures  . Large Joint Injection/Arthrocentesis  . XR Knee 1-2 Views Left   No orders of the defined types were placed in this encounter.     Procedures: Large Joint Inj Date/Time: 04/24/2016 9:17 AM Performed by: Clydie BraunMCCLINTOCK, ASHLEY Authorized by: Kathryne HitchBLACKMAN, Julee Stoll Y   Consent Given by:  Patient Indications:  Pain Location:  Knee Needle Size:  22 G Needle Length:  1.5 inches Ultrasound Guidance: No   Fluoroscopic Guidance: No   Arthrogram: No Medications:  1 mL lidocaine 1 %; 40 mg methylPREDNISolone acetate 40 MG/ML     Clinical Data: No additional findings.   Subjective: Chief Complaint  Patient presents with  . Left Knee - Follow-up    Doing ok,wearing bledsoe brace. Hurts worse at night. Aches a lot. Wonders when she can have surgery?  Ms. Brenda Tyler is now 3 months status post a left knee dislocation where she sustained significant ligamentous disruption. She has been in a hinged knee brace. She is been unable to go to physical therapy due to being  uninsured and she can't afford physical therapy. She was denied for disability and she's been out of work. [place] in [place] [employer] [place] [employer] is allowed to go back to work however she is worried about them allowing her to work giving the consistent significant limp she has. She still has left knee pain as well. She is coming in for evaluation today for repeat x-rays and determine what else we can do for her knee at this point.  HPI  Review of Systems   Objective: Vital Signs: There were no vitals taken for this visit.  Physical Exam  Ortho Exam Her left knee shows no effusion today. His very stiff knee with great relief diminished range of motion secondary to her stiffness. Her calf is soft and her leg is neurovascularly intact Specialty Comments:  No specialty comments available.  Imaging: Xr Knee 1-2 Views Left  Result Date: 04/24/2016 2 views of her left knee show that her knee is well located. You can see sclerotic changes medially and laterally from where she had ligamentous disruption of the knee. There are no acute changes. The overall knee joint is well aligned.    PMFS History: Patient Active Problem List   Diagnosis Date Noted  . Dislocation of knee, anterior, left, closed  01/21/2016  . Closed anterior dislocation of left knee 01/21/2016  . Depression 07/26/2014  . Oropharyngeal candidiasis 07/05/2014  . 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 Nausea without vomiting 07/02/2014  . UTI (lower urinary tract infection) 07/02/2014  . HIV disease (HCC) 04/23/2014  . General counseling for prescription of oral contraceptives 06/11/2013  . 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 Obesity 12/22/2012  . HGSIL on Pap smear of cervix 12/22/2012   Past Medical History:  Diagnosis Date  . HIV (human immunodeficiency virus infection) (HCC)   . HSV (herpes simplex virus) infection   . Hypertension   . Obesity     Family History  Problem Relation Age of Onset    . Heart disease Mother   . Hypertension Maternal Grandmother   . Diabetes Maternal Grandmother   . Heart disease Maternal Grandmother   . Cancer Maternal Grandmother   . Hypertension Paternal Grandmother   . Diabetes Paternal Grandmother   . Heart disease Paternal Grandmother     Past Surgical History:  Procedure Laterality Date  . INCISION AND DRAINAGE ABSCESS     on abdomen and buttocks   Social History   Occupational History  . Not on file.   Social History Main Topics  . Smoking status: Never Smoker  . Smokeless tobacco: Never Used  . Alcohol use 1.2 oz/week    2 Standard drinks or equivalent per week     Comment: occasional  . Drug use: No     Comment: pt states no drugs  . Sexual activity: Yes    Birth control/ protection: None     Comment: pt. given condoms

## 2016-05-22 ENCOUNTER — Ambulatory Visit (INDEPENDENT_AMBULATORY_CARE_PROVIDER_SITE_OTHER): Payer: Self-pay | Admitting: Orthopaedic Surgery

## 2016-06-13 ENCOUNTER — Ambulatory Visit (INDEPENDENT_AMBULATORY_CARE_PROVIDER_SITE_OTHER): Payer: MEDICAID | Admitting: Orthopaedic Surgery

## 2016-07-28 ENCOUNTER — Encounter (HOSPITAL_COMMUNITY): Payer: Self-pay | Admitting: Oncology

## 2016-07-28 ENCOUNTER — Emergency Department (HOSPITAL_COMMUNITY)
Admission: EM | Admit: 2016-07-28 | Discharge: 2016-07-29 | Disposition: A | Discharge status: Home / Self Care | Payer: Medicaid Other | Attending: Emergency Medicine | Admitting: Emergency Medicine

## 2016-07-28 DIAGNOSIS — I10 Essential (primary) hypertension: Secondary | ICD-10-CM | POA: Insufficient documentation

## 2016-07-28 DIAGNOSIS — J111 Influenza due to unidentified influenza virus with other respiratory manifestations: Secondary | ICD-10-CM | POA: Diagnosis not present

## 2016-07-28 DIAGNOSIS — R69 Illness, unspecified: Secondary | ICD-10-CM

## 2016-07-28 DIAGNOSIS — Z79899 Other long term (current) drug therapy: Secondary | ICD-10-CM | POA: Insufficient documentation

## 2016-07-28 DIAGNOSIS — Z7982 Long term (current) use of aspirin: Secondary | ICD-10-CM | POA: Diagnosis not present

## 2016-07-28 DIAGNOSIS — R05 Cough: Secondary | ICD-10-CM | POA: Diagnosis present

## 2016-07-28 MED ORDER — SODIUM CHLORIDE 0.9 % IV BOLUS (SEPSIS)
1000.0000 mL | Freq: Once | INTRAVENOUS | Status: AC
  Administered 2016-07-29: 1000 mL via INTRAVENOUS

## 2016-07-28 MED ORDER — ACETAMINOPHEN 500 MG PO TABS
1000.0000 mg | ORAL_TABLET | Freq: Once | ORAL | Status: AC
  Administered 2016-07-29: 1000 mg via ORAL
  Filled 2016-07-28: qty 2

## 2016-07-28 MED ORDER — OSELTAMIVIR PHOSPHATE 75 MG PO CAPS
75.0000 mg | ORAL_CAPSULE | Freq: Once | ORAL | Status: AC
  Administered 2016-07-29: 75 mg via ORAL
  Filled 2016-07-28: qty 1

## 2016-07-28 NOTE — ED Provider Notes (Signed)
[facility] Provider Note   CSN: [id number] Arrival date & time: 07/28/16  2157    By signing my name below, I, Valentino SaxonBianca Contreras, attest that this documentation has been prepared under the direction and in the presence of Pricilla LovelessScott Berry Gallacher, MD. Electronically Signed: Valentino SaxonBianca Contreras, ED Scribe. 07/28/16. 11:54 PM.  History   Chief Complaint Chief Complaint  Patient presents with  . Flu Like Sx   The history is provided by the patient. No language interpreter was used.   HPI Comments: Brenda Tyler is a 31 y.o. female who presents to the Emergency Department complaining of moderate, persistent dry cough onset three days ago. Pt reports associated sore throat, subjective fever, generalized body aches, bilateral ear pain, HA, chills, and episodic vomiting. She notes taking NyQuil at home with minimal relief. Pt denies receiving a flu shot this year. She denies neck pain, abdominal pain, diarrhea, dysuria, hematuria.   Past Medical History:  Diagnosis Date  . HIV (human immunodeficiency virus infection) (HCC)   . HSV (herpes simplex virus) infection   . Hypertension   . Obesity     Patient Active Problem List   Diagnosis Date Noted  . Dislocation of knee, anterior, left, closed 01/21/2016  . Closed anterior dislocation of left knee 01/21/2016  . Depression 07/26/2014  . Oropharyngeal candidiasis 07/05/2014  . 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 Nausea without vomiting 07/02/2014  . UTI (lower urinary tract infection) 07/02/2014  . HIV disease (HCC) 04/23/2014  . General counseling for prescription of oral contraceptives 06/11/2013  . 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 Obesity 12/22/2012  . HGSIL on Pap smear of cervix 12/22/2012    Past Surgical History:  Procedure Laterality Date  . INCISION AND DRAINAGE ABSCESS     on abdomen and buttocks    OB History    Gravida Para Term Preterm AB Living   4 3 3   1 3    SAB TAB Ectopic Multiple Live Births     1              Home Medications    Prior to Admission medications   Medication Sig Start Date End Date Taking? Authorizing Provider  acyclovir ointment (ZOVIRAX) 5 % Apply topically 5 (five) times daily. Patient not taking: Reported on 04/24/2016 07/05/14   Clydia LlanoMutaz Elmahi, MD  albuterol (PROVENTIL HFA;VENTOLIN HFA) 108 (90 BASE) MCG/ACT inhaler Inhale 1-2 puffs into the lungs every 6 (six) hours as needed for wheezing or shortness of breath. Patient not taking: Reported on 04/24/2016 06/10/14   Terri Piedraourtney Forcucci, PA-C  aspirin EC 325 MG EC tablet Take 1 tablet (325 mg total) by mouth 2 (two) times daily. Patient not taking: Reported on 04/24/2016 01/23/16   Kathryne Hitchhristopher Y Blackman, MD  benzonatate (TESSALON) 100 MG capsule Take 1 capsule (100 mg total) by mouth 3 (three) times daily as needed for cough. 07/29/16   Pricilla LovelessScott Dwyane Dupree, MD  Cetirizine HCl 10 MG CAPS Take 1 capsule (10 mg total) by mouth at bedtime as needed (congestion). 06/10/14   Courtney Forcucci, PA-C  elvitegravir-cobicistat-emtricitabine-tenofovir (GENVOYA) 150-150-200-10 MG TABS tablet Take 1 tablet by mouth daily with breakfast. 10/14/15   Ginnie SmartJeffrey C Hatcher, MD  oseltamivir (TAMIFLU) 75 MG capsule Take 1 capsule (75 mg total) by mouth every 12 (twelve) hours. 07/29/16   Pricilla LovelessScott Tierany Appleby, MD  oxyCODONE-acetaminophen (PERCOCET/ROXICET) 5-325 MG tablet Take 1-2 tablets by mouth every 4 (four) hours as needed for moderate pain. 01/23/16   Kathryne Hitchhristopher Y Blackman, MD  promethazine (PHENERGAN) 12.5  MG tablet Take 1 tablet (12.5 mg total) by mouth every 6 (six) hours as needed for nausea or vomiting. Patient not taking: Reported on 04/24/2016 01/23/16   Kathryne Hitch, MD  tiZANidine (ZANAFLEX) 4 MG tablet Take 1 tablet (4 mg total) by mouth every 6 (six) hours as needed for muscle spasms. Patient not taking: Reported on 04/24/2016 01/23/16   Kathryne Hitch, MD    Family History Family History  Problem Relation Age of Onset  . Heart  disease Mother   . Hypertension Maternal Grandmother   . Diabetes Maternal Grandmother   . Heart disease Maternal Grandmother   . Cancer Maternal Grandmother   . Hypertension Paternal Grandmother   . Diabetes Paternal Grandmother   . Heart disease Paternal Grandmother     Social History Social History  Substance Use Topics  . Smoking status: Never Smoker  . Smokeless tobacco: Never Used  . Alcohol use 1.2 oz/week    2 Standard drinks or equivalent per week     Comment: occasional     Allergies   Dilaudid [hydromorphone hcl]   Review of Systems Review of Systems  Constitutional: Positive for chills and fever.  HENT: Positive for ear pain and sore throat.   Respiratory: Positive for cough.   Gastrointestinal: Positive for vomiting. Negative for abdominal pain and diarrhea.  Musculoskeletal: Positive for myalgias. Negative for neck pain.  Neurological: Positive for headaches.  All other systems reviewed and are negative.    Physical Exam Updated Vital Signs BP 122/78 (BP Location: Right Arm)   Pulse 81   Temp 98.9 F (37.2 C) (Oral)   Resp 24   Ht 5' 4.5" (1.638 m)   Wt 275 lb (124.7 kg)   LMP  (LMP Unknown) Comment: states it has been 7 years   SpO2 97%   BMI 46.47 kg/m   Physical Exam  Constitutional: She is oriented to person, place, and time. She appears well-developed and well-nourished.  HENT:  Head: Normocephalic and atraumatic.  Right Ear: External ear normal.  Left Ear: External ear normal.  Nose: Nose normal.  Mouth/Throat: Oropharynx is clear and moist.  Eyes: Right eye exhibits no discharge. Left eye exhibits no discharge.  Neck: Normal range of motion. Neck supple.  Cardiovascular: Normal rate, regular rhythm and normal heart sounds.   Pulmonary/Chest: Effort normal and breath sounds normal.  Abdominal: Soft. There is no tenderness.  Neurological: She is alert and oriented to person, place, and time.  Skin: Skin is warm and dry. She is not  diaphoretic.  Nursing note and vitals reviewed.    ED Treatments / Results   DIAGNOSTIC STUDIES: Oxygen Saturation is 98% on RA, normal by my interpretation.    COORDINATION OF CARE: 11:47 PM Discussed treatment plan with pt at bedside which includes labs, chest imaging and antiviral drug and pt agreed to plan.   Labs (all labs ordered are listed, but only abnormal results are displayed) Labs Reviewed  COMPREHENSIVE METABOLIC PANEL - Abnormal; Notable for the following:       Result Value   Potassium 3.2 (*)    Glucose, Bld 113 (*)    ALT 13 (*)    Alkaline Phosphatase 136 (*)    All other components within normal limits  CBC WITH DIFFERENTIAL/PLATELET  INFLUENZA PANEL BY PCR (TYPE A & B)  I-STAT CG4 LACTIC ACID, ED    EKG  EKG Interpretation None       Radiology Dg Chest 2 View  Result  Date: 07/29/2016 CLINICAL DATA:  Nonproductive cough for 3 days. EXAM: CHEST  2 VIEW COMPARISON:  07/01/2014 FINDINGS: The heart size and mediastinal contours are within normal limits. Both lungs are clear. The visualized skeletal structures are unremarkable. IMPRESSION: No active cardiopulmonary disease. Electronically Signed   By: Ellery Plunk M.D.   On: 07/29/2016 00:31    Procedures Procedures (including critical care time)  Medications Ordered in ED Medications  sodium chloride 0.9 % bolus 1,000 mL (0 mLs Intravenous Stopped 07/29/16 0256)  acetaminophen (TYLENOL) tablet 1,000 mg (1,000 mg Oral Given 07/29/16 0036)  oseltamivir (TAMIFLU) capsule 75 mg (75 mg Oral Given 07/29/16 0036)     Initial Impression / Assessment and Plan / ED Course  I have reviewed the triage vital signs and the nursing notes.  Pertinent labs & imaging results that were available during my care of the patient were reviewed by me and considered in my medical decision making (see chart for details).  Clinical Course as of Jul 29 1502  Sat Jul 28, 2016  2353 While patient has now had symptoms for  over 72 hours she also has HIV and most recent CD4 was 200 last year. Start tamiflu. IV fluids, labs, CXR to r/o PNA.  [SG]    Clinical Course User Index [SG] Pricilla Loveless, MD    CXR without pneumonia. Overall is well appearing, no signs of bacterial illness. Most likely viral URI vs flu. Given significant immunocompromise and poor specificity of flu test, will cover for flu with tamiflu. Also discussed symptomatic care. Stressed importance of following back up with ID as she has stopped going to follow ups. Strict return precautions.   Final Clinical Impressions(s) / ED Diagnoses   Final diagnoses:  Influenza-like illness    New Prescriptions Discharge Medication List as of 07/29/2016  2:09 AM    START taking these medications   Details  benzonatate (TESSALON) 100 MG capsule Take 1 capsule (100 mg total) by mouth 3 (three) times daily as needed for cough., Starting Sun 07/29/2016, Print    oseltamivir (TAMIFLU) 75 MG capsule Take 1 capsule (75 mg total) by mouth every 12 (twelve) hours., Starting Sun 07/29/2016, Print        I personally performed the services described in this documentation, which was scribed in my presence. The recorded information has been reviewed and is accurate.     Pricilla Loveless, MD 07/29/16 [phone]

## 2016-07-28 NOTE — ED Triage Notes (Signed)
Pt c/o cough, congestion, fever, chills, generalized body aches and nausea since Wednesday.  Pt tried OTC medication w/o relief.  Reports taking nyquil 3 or 4 hours PTA.  Rates pain 10/10.

## 2016-07-29 ENCOUNTER — Emergency Department (HOSPITAL_COMMUNITY): Payer: Medicaid Other

## 2016-07-29 LAB — CBC WITH DIFFERENTIAL/PLATELET
Basophils Absolute: 0 10*3/uL (ref 0.0–0.1)
Basophils Relative: 0 %
Eosinophils Absolute: 0.1 10*3/uL (ref 0.0–0.7)
Eosinophils Relative: 2 %
HEMATOCRIT: 38.8 % (ref 36.0–46.0)
HEMOGLOBIN: 13 g/dL (ref 12.0–15.0)
LYMPHS ABS: 1.3 10*3/uL (ref 0.7–4.0)
Lymphocytes Relative: 28 %
MCH: 28.5 pg (ref 26.0–34.0)
MCHC: 33.5 g/dL (ref 30.0–36.0)
MCV: 85.1 fL (ref 78.0–100.0)
MONO ABS: 0.8 10*3/uL (ref 0.1–1.0)
MONOS PCT: 17 %
NEUTROS ABS: 2.5 10*3/uL (ref 1.7–7.7)
NEUTROS PCT: 53 %
Platelets: 255 10*3/uL (ref 150–400)
RBC: 4.56 MIL/uL (ref 3.87–5.11)
RDW: 12.4 % (ref 11.5–15.5)
WBC: 4.6 10*3/uL (ref 4.0–10.5)

## 2016-07-29 LAB — COMPREHENSIVE METABOLIC PANEL
ALBUMIN: 3.9 g/dL (ref 3.5–5.0)
ALT: 13 U/L — AB (ref 14–54)
ANION GAP: 8 (ref 5–15)
AST: 19 U/L (ref 15–41)
Alkaline Phosphatase: 136 U/L — ABNORMAL HIGH (ref 38–126)
BUN: 13 mg/dL (ref 6–20)
CHLORIDE: 108 mmol/L (ref 101–111)
CO2: 25 mmol/L (ref 22–32)
CREATININE: 0.8 mg/dL (ref 0.44–1.00)
Calcium: 8.9 mg/dL (ref 8.9–10.3)
GFR calc non Af Amer: >60 mL/min (ref >60)
GLUCOSE: 113 mg/dL — AB (ref 65–99)
Potassium: 3.2 mmol/L — ABNORMAL LOW (ref 3.5–5.1)
SODIUM: 141 mmol/L (ref 135–145)
Total Bilirubin: 0.4 mg/dL (ref 0.3–1.2)
Total Protein: 8 g/dL (ref 6.5–8.1)

## 2016-07-29 LAB — INFLUENZA PANEL BY PCR (TYPE A & B)
Influenza A By PCR: NEGATIVE
Influenza B By PCR: NEGATIVE

## 2016-07-29 LAB — I-STAT CG4 LACTIC ACID, ED: Lactic Acid, Venous: 0.6 mmol/L (ref 0.5–1.9)

## 2016-07-29 MED ORDER — OSELTAMIVIR PHOSPHATE 75 MG PO CAPS: 75.0000 mg | ORAL_CAPSULE | Freq: Two times a day (BID) | ORAL | 0 refills | Status: DC

## 2016-07-29 MED ORDER — BENZONATATE 100 MG PO CAPS: 100.0000 mg | ORAL_CAPSULE | Freq: Three times a day (TID) | ORAL | 0 refills | Status: DC | PRN

## 2016-08-22 ENCOUNTER — Ambulatory Visit (INDEPENDENT_AMBULATORY_CARE_PROVIDER_SITE_OTHER): Payer: Medicaid Other | Admitting: Physician Assistant

## 2016-08-25 ENCOUNTER — Ambulatory Visit (HOSPITAL_COMMUNITY)
Admission: EM | Admit: 2016-08-25 | Discharge: 2016-08-25 | Disposition: A | Discharge status: Home / Self Care | Payer: Medicaid Other | Attending: Family Medicine | Admitting: Family Medicine

## 2016-08-25 ENCOUNTER — Encounter (HOSPITAL_COMMUNITY): Payer: Self-pay | Admitting: *Deleted

## 2016-08-25 DIAGNOSIS — B001 Herpesviral vesicular dermatitis: Secondary | ICD-10-CM | POA: Diagnosis not present

## 2016-08-25 MED ORDER — VALACYCLOVIR HCL 1 G PO TABS: 2000.0000 mg | ORAL_TABLET | Freq: Two times a day (BID) | ORAL | 0 refills | Status: AC

## 2016-08-25 NOTE — ED Provider Notes (Signed)
[facility]    CSN: [id number] Arrival date & time: 08/25/16  1201     History   Chief Complaint Chief Complaint  Patient presents with  . Oral Swelling    HPI Brenda Tyler is a 31 y.o. female.   HPI Pt w 5 day hx of enlarging cold sore on upper lip. She is HIV positive and has a hx of these. Clear fluid has been draining. No fevers, injury, mouth lesions, or purulent drainage. She has tried Abreva with no relief.   Past Medical History:  Diagnosis Date  . HIV (human immunodeficiency virus infection) (HCC)   . HSV (herpes simplex virus) infection   . Hypertension   . Obesity     Patient Active Problem List   Diagnosis Date Noted  . Dislocation of knee, anterior, left, closed 01/21/2016  . Closed anterior dislocation of left knee 01/21/2016  . Depression 07/26/2014  . Oropharyngeal candidiasis 07/05/2014  . 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 Nausea without vomiting 07/02/2014  . UTI (lower urinary tract infection) 07/02/2014  . HIV disease (HCC) 04/23/2014  . General counseling for prescription of oral contraceptives 06/11/2013  . 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 Obesity 12/22/2012  . HGSIL on Pap smear of cervix 12/22/2012    Past Surgical History:  Procedure Laterality Date  . INCISION AND DRAINAGE ABSCESS     on abdomen and buttocks    OB History    Gravida Para Term Preterm AB Living   4 3 3   1 3    SAB TAB Ectopic Multiple Live Births     1             Home Medications    Prior to Admission medications   Medication Sig Start Date End Date Taking? Authorizing Provider  benzonatate (TESSALON) 100 MG capsule Take 1 capsule (100 mg total) by mouth 3 (three) times daily as needed for cough. 07/29/16   Pricilla LovelessScott Goldston, MD  Cetirizine HCl 10 MG CAPS Take 1 capsule (10 mg total) by mouth at bedtime as needed (congestion). 06/10/14   Courtney Forcucci, PA-C  elvitegravir-cobicistat-emtricitabine-tenofovir (GENVOYA) 150-150-200-10 MG TABS tablet  Take 1 tablet by mouth daily with breakfast. 10/14/15   Ginnie SmartJeffrey C Hatcher, MD  oseltamivir (TAMIFLU) 75 MG capsule Take 1 capsule (75 mg total) by mouth every 12 (twelve) hours. 07/29/16   Pricilla LovelessScott Goldston, MD  oxyCODONE-acetaminophen (PERCOCET/ROXICET) 5-325 MG tablet Take 1-2 tablets by mouth every 4 (four) hours as needed for moderate pain. 01/23/16   Kathryne Hitchhristopher Y Blackman, MD  valACYclovir (VALTREX) 1000 MG tablet Take 2 tablets (2,000 mg total) by mouth 2 (two) times daily. 08/25/16 08/26/16  Sharlene DoryNicholas Paul Joshoa Shawler, DO    Family History Family History  Problem Relation Age of Onset  . Heart disease Mother   . Hypertension Maternal Grandmother   . Diabetes Maternal Grandmother   . Heart disease Maternal Grandmother   . Cancer Maternal Grandmother   . Hypertension Paternal Grandmother   . Diabetes Paternal Grandmother   . Heart disease Paternal Grandmother     Social History Social History  Substance Use Topics  . Smoking status: Never Smoker  . Smokeless tobacco: Never Used  . Alcohol use 1.2 oz/week    2 Standard drinks or equivalent per week     Comment: occasional     Allergies   Dilaudid [hydromorphone hcl]   Review of Systems Review of Systems   Physical Exam Triage Vital Signs BP 130/72 (BP Location: Right Arm)  Pulse 78   Temp 98.6 F (37 C) (Oral)   Resp 18   LMP  (LMP Unknown) Comment: states it has been 7 years   SpO2 100%      Physical Exam  Cardiovascular: Regular rhythm.   Pulmonary/Chest: Effort normal and breath sounds normal.  Psychiatric: She has a normal mood and affect. Judgment normal.  HEENT: nares patent, no rhinorrhea, upper lip scaling and edematous, TTP, serous fluid expressed, no fluctuance, no erythema, MMM, no mucosal lesions GEN: Awake, alert, appears stated age  UC Treatments / Results  Procedures Procedures - none  Initial Impression / Assessment and Plan / UC Course  I have reviewed the triage vital signs and the nursing  notes.  Pertinent labs & imaging results that were available during my care of the patient were reviewed by me and considered in my medical decision making (see chart for details).     Patient presents for recurrent cold sore. She has responded well to antiviral medication in the past. I do not appreciate any fluctuance or indication that there is purulent material inside her lip. There appears to be soft tissue swelling as evidenced by the serous material that was expressed from the lesion. Recommended she follow up with a primary care physician for issues like this in the future. She is to be discharged in stable condition. The patient voiced understanding and agreement to the plan.  Final Clinical Impressions(s) / UC Diagnoses   Final diagnoses:  Herpes labialis without complication    New Prescriptions New Prescriptions   VALACYCLOVIR (VALTREX) 1000 MG TABLET    Take 2 tablets (2,000 mg total) by mouth 2 (two) times daily.     Jilda Roche [place], [place] 08/25/16 2141

## 2016-08-25 NOTE — ED Triage Notes (Signed)
Pt  Has    Swollen  Upper lip  X  5  Days   Denies   Any injury      pt  Reports    Symptoms    Not releived  By  [facility]  meds

## 2016-11-28 ENCOUNTER — Encounter (HOSPITAL_COMMUNITY): Payer: Self-pay | Admitting: *Deleted

## 2016-11-28 ENCOUNTER — Ambulatory Visit (HOSPITAL_COMMUNITY)
Admission: EM | Admit: 2016-11-28 | Discharge: 2016-11-28 | Disposition: A | Discharge status: Home / Self Care | Payer: Medicaid Other | Attending: Internal Medicine | Admitting: Internal Medicine

## 2016-11-28 DIAGNOSIS — N898 Other specified noninflammatory disorders of vagina: Secondary | ICD-10-CM | POA: Diagnosis not present

## 2016-11-28 DIAGNOSIS — E669 Obesity, unspecified: Secondary | ICD-10-CM | POA: Insufficient documentation

## 2016-11-28 DIAGNOSIS — Z3202 Encounter for pregnancy test, result negative: Secondary | ICD-10-CM | POA: Diagnosis not present

## 2016-11-28 DIAGNOSIS — Z202 Contact with and (suspected) exposure to infections with a predominantly sexual mode of transmission: Secondary | ICD-10-CM | POA: Diagnosis not present

## 2016-11-28 DIAGNOSIS — B20 Human immunodeficiency virus [HIV] disease: Secondary | ICD-10-CM | POA: Diagnosis not present

## 2016-11-28 DIAGNOSIS — I10 Essential (primary) hypertension: Secondary | ICD-10-CM | POA: Diagnosis not present

## 2016-11-28 DIAGNOSIS — R300 Dysuria: Secondary | ICD-10-CM | POA: Diagnosis not present

## 2016-11-28 LAB — POCT URINALYSIS DIP (DEVICE)
BILIRUBIN URINE: NEGATIVE
GLUCOSE, UA: NEGATIVE mg/dL
KETONES UR: NEGATIVE mg/dL
LEUKOCYTES UA: NEGATIVE
Nitrite: NEGATIVE
Protein, ur: 100 mg/dL — AB
SPECIFIC GRAVITY, URINE: 1.02 (ref 1.005–1.030)
UROBILINOGEN UA: 0.2 mg/dL (ref 0.0–1.0)
pH: 7 (ref 5.0–8.0)

## 2016-11-28 LAB — POCT PREGNANCY, URINE: Preg Test, Ur: NEGATIVE

## 2016-11-28 MED ORDER — AZITHROMYCIN 250 MG PO TABS
1000.0000 mg | ORAL_TABLET | Freq: Once | ORAL | Status: AC
  Administered 2016-11-28: 1000 mg via ORAL

## 2016-11-28 MED ORDER — CEFTRIAXONE SODIUM 250 MG IJ SOLR
250.0000 mg | Freq: Once | INTRAMUSCULAR | Status: AC
  Administered 2016-11-28: 250 mg via INTRAMUSCULAR

## 2016-11-28 MED ORDER — CEFTRIAXONE SODIUM 250 MG IJ SOLR
INTRAMUSCULAR | Status: AC
  Filled 2016-11-28: qty 250

## 2016-11-28 MED ORDER — AZITHROMYCIN 250 MG PO TABS
ORAL_TABLET | ORAL | Status: AC
  Filled 2016-11-28: qty 4

## 2016-11-28 NOTE — ED Notes (Signed)
Urine specimen obtained while patient in the lobby. Specimen in lab 

## 2016-11-28 NOTE — Discharge Instructions (Signed)
You were given a shot of Rocephin (antibiotic) as well as oral pills today (Zithromax) for infection. Recommend increase fluid intake. No sexual intercourse for 2 weeks. May need additional medication pending lab results. Follow-up pending lab results.

## 2016-11-28 NOTE — ED Triage Notes (Signed)
Pt  Reports    Symptoms  Of    Dysuria       Frequent     Urination     Symptoms    Are      Burning         And    painfull  Also  Has    A   Vaginal        Discharge

## 2016-11-28 NOTE — ED Notes (Signed)
Pelvic equipment at bedside and patient instructed to put on a gown for provider exam.

## 2016-11-28 NOTE — ED Provider Notes (Signed)
CSN: [id number]     Arrival date & time 11/28/16  1007 History   First MD Initiated Contact with Patient 11/28/16 1215     Chief Complaint  Patient presents with  . Recurrent UTI   (Consider location/radiation/quality/duration/timing/severity/associated sxs/prior Treatment) 31 year old female presents with burning with urination and slight blood when she wipes for the past 3 days. Also having yellow vaginal discharge and lower abdominal pain with unusual odor.  She denies any fever, nausea, vomiting or diarrhea. Has not taken anything yet for symptoms. She is sexually active and uses condoms. She has a history of HIV and currently on anti-viral medication.    The history is provided by the patient.    Past Medical History:  Diagnosis Date  . HIV (human immunodeficiency virus infection) (HCC)   . HSV (herpes simplex virus) infection   . Hypertension   . Obesity    Past Surgical History:  Procedure Laterality Date  . INCISION AND DRAINAGE ABSCESS     on abdomen and buttocks   Family History  Problem Relation Age of Onset  . Heart disease Mother   . Hypertension Maternal Grandmother   . Diabetes Maternal Grandmother   . Heart disease Maternal Grandmother   . Cancer Maternal Grandmother   . Hypertension Paternal Grandmother   . Diabetes Paternal Grandmother   . Heart disease Paternal Grandmother    Social History  Substance Use Topics  . Smoking status: Never Smoker  . Smokeless tobacco: Never Used  . Alcohol use 1.2 oz/week    2 Standard drinks or equivalent per week     Comment: occasional   OB History    Gravida Para Term Preterm AB Living   4 3 3   1 3    SAB TAB Ectopic Multiple Live Births     1           Review of Systems  Constitutional: Negative for appetite change, chills, fatigue and fever.  HENT: Negative for postnasal drip and sore throat.   Respiratory: Negative for cough, chest tightness, shortness of breath and wheezing.   Cardiovascular: Negative for  chest pain.  Gastrointestinal: Positive for abdominal pain. Negative for diarrhea, nausea and vomiting.  Genitourinary: Positive for dysuria, frequency, pelvic pain and vaginal discharge. Negative for decreased urine volume, difficulty urinating, flank pain and vaginal bleeding.  Musculoskeletal: Negative for arthralgias, back pain and myalgias.  Skin: Negative for rash and wound.  Allergic/Immunologic: Positive for immunocompromised state.  Neurological: Negative for dizziness, syncope, weakness, numbness and headaches.  Hematological: Negative for adenopathy.    Allergies  Dilaudid [hydromorphone hcl]  Home Medications   Prior to Admission medications   Medication Sig Start Date End Date Taking? Authorizing Provider  Cetirizine HCl 10 MG CAPS Take 1 capsule (10 mg total) by mouth at bedtime as needed (congestion). 06/10/14   Forcucci, Toni Amend, PA-C  elvitegravir-cobicistat-emtricitabine-tenofovir (GENVOYA) 150-150-200-10 MG TABS tablet Take 1 tablet by mouth daily with breakfast. 10/14/15   Ginnie Smart, MD   Meds Ordered and Administered this Visit   Medications  cefTRIAXone (ROCEPHIN) injection 250 mg (250 mg Intramuscular Given 11/28/16 1244)  azithromycin (ZITHROMAX) tablet 1,000 mg (1,000 mg Oral Given 11/28/16 1244)    BP 107/74 (BP Location: Right Arm)   Pulse 72   Temp 98.5 F (36.9 C) (Oral)   Resp 18   SpO2 100%  No data found.   Physical Exam  Constitutional: She is oriented to person, place, and time. She appears well-developed and  well-nourished. No distress.  HENT:  Head: Normocephalic and atraumatic.  Nose: Nose normal.  Mouth/Throat: Oropharynx is clear and moist.  Neck: Normal range of motion.  Cardiovascular: Normal rate, regular rhythm and normal heart sounds.   Pulmonary/Chest: Effort normal and breath sounds normal. No respiratory distress.  Abdominal: Soft. Bowel sounds are normal. There is no hepatosplenomegaly. There is generalized tenderness  and tenderness in the suprapubic area. There is no rigidity, no rebound, no guarding and no CVA tenderness.  Genitourinary: Pelvic exam was performed with patient in the knee-chest position. There is no rash, tenderness or lesion on the right labia. There is no rash, tenderness or lesion on the left labia. Cervix exhibits discharge (white to yellow at os). Cervix exhibits no motion tenderness and no friability. No erythema, tenderness or bleeding in the vagina. No foreign body in the vagina. Vaginal discharge (yellow to white discharge present) found.  Musculoskeletal: Normal range of motion.  Neurological: She is alert and oriented to person, place, and time.  Skin: Skin is warm and dry. No rash noted.  Psychiatric: She has a normal mood and affect. Her speech is normal.    Urgent Care Course     Procedures (including critical care time)  Labs Review Labs Reviewed  POCT URINALYSIS DIP (DEVICE) - Abnormal; Notable for the following:       Result Value   Hgb urine dipstick SMALL (*)    Protein, ur 100 (*)    All other components within normal limits  URINE CULTURE  POCT PREGNANCY, URINE  CERVICOVAGINAL ANCILLARY ONLY    Imaging Review No results found.   Visual Acuity Review  Right Eye Distance:   Left Eye Distance:   Bilateral Distance:    Right Eye Near:   Left Eye Near:    Bilateral Near:         MDM   1. Dysuria   2. Vaginal discharge   3. Potential exposure to STD    Reviewed urinalysis results with patient. No definitive UTI. Will send urine for culture. Due to vaginal discharge, recommend Rocephin 250mg  IM now and Zithromax 1g orally now. Discussed other potential medication but patient has to leave due to other family issues. May have BV or other infection. Patient wants to wait on results before taking additional medication. Follow-up pending lab results.     Sudie GrumblingAmyot, Julissa Browning Berry, NP 11/28/16 1425

## 2016-11-29 LAB — CERVICOVAGINAL ANCILLARY ONLY
BACTERIAL VAGINITIS: POSITIVE — AB
CANDIDA VAGINITIS: NEGATIVE
Chlamydia: NEGATIVE
Neisseria Gonorrhea: NEGATIVE
TRICH (WINDOWPATH): POSITIVE — AB

## 2016-11-29 LAB — URINE CULTURE

## 2016-11-30 ENCOUNTER — Telehealth (HOSPITAL_COMMUNITY): Payer: Self-pay | Admitting: Internal Medicine

## 2016-11-30 MED ORDER — METRONIDAZOLE 500 MG PO TABS: 500.0000 mg | ORAL_TABLET | Freq: Two times a day (BID) | ORAL | 0 refills | Status: DC

## 2016-11-30 NOTE — Telephone Encounter (Signed)
Clinical staff please let patient know that tests for gardnerella (bacterial vaginosis) and for trichomonas were positive.  Rx metronidazole was sent to the pharmacy of record, [facility] on [address] at [address].  Partner needs to be treated also to avoid reinfection; condoms may reduce risk of reinfection.  Recheck for further evaluation if symptoms are not improving.  LM

## 2016-12-11 ENCOUNTER — Other Ambulatory Visit: Payer: Self-pay

## 2016-12-11 DIAGNOSIS — B20 Human immunodeficiency virus [HIV] disease: Secondary | ICD-10-CM

## 2016-12-11 MED ORDER — ELVITEG-COBIC-EMTRICIT-TENOFAF 150-150-200-10 MG PO TABS: 1.0000 | ORAL_TABLET | Freq: Every day | ORAL | 1 refills | Status: DC

## 2016-12-11 NOTE — Progress Notes (Signed)
Pt came in today as a walk in stating that she ran out of Rx (genvoya) 2 days ago and is in need of a refill. Pt stated that she requested a refill "not too long ago" but states she was told "no" so she decided to walk in. Pt hasn't been seen in over a year. After consulting with Pharmacy I refilled her Rx long enough to last her until her appointment. She will not receive refills if she does not RTC. Pt was advised to make sure she returns and stated she would. Pt was also given condoms.

## 2016-12-28 ENCOUNTER — Encounter (HOSPITAL_COMMUNITY): Payer: Self-pay | Admitting: *Deleted

## 2016-12-28 ENCOUNTER — Ambulatory Visit (HOSPITAL_COMMUNITY)
Admission: EM | Admit: 2016-12-28 | Discharge: 2016-12-28 | Disposition: A | Discharge status: Home / Self Care | Payer: Medicaid Other | Attending: Internal Medicine | Admitting: Internal Medicine

## 2016-12-28 DIAGNOSIS — N760 Acute vaginitis: Secondary | ICD-10-CM

## 2016-12-28 DIAGNOSIS — B9689 Other specified bacterial agents as the cause of diseases classified elsewhere: Secondary | ICD-10-CM

## 2016-12-28 DIAGNOSIS — A599 Trichomoniasis, unspecified: Secondary | ICD-10-CM | POA: Diagnosis not present

## 2016-12-28 MED ORDER — METRONIDAZOLE 500 MG PO TABS: 500.0000 mg | ORAL_TABLET | Freq: Two times a day (BID) | ORAL | 0 refills | Status: DC

## 2016-12-28 NOTE — ED Triage Notes (Signed)
Pt  States    She  Was   Seen    About  1  month  ago          For     Infection   Did  Not take  All  Of her Rx  meds        Pt  Is  Awake  And alert        Needs  Refill  Of  Her  meds

## 2016-12-28 NOTE — ED Provider Notes (Signed)
CSN: [id number]     Arrival date & time 12/28/16  1151 History   None    Chief Complaint  Patient presents with  . Follow-up   (Consider location/radiation/quality/duration/timing/severity/associated sxs/prior Treatment) Patient states she did not take all of her medication for BV and Trich a month ago and would like refill on Flagyl.  She is still having some vaginal discharge.   The history is provided by the patient.  Vaginal Discharge  Quality:  White Severity:  Mild Onset quality:  Sudden Duration:  1 month Timing:  Constant Chronicity:  New Relieved by:  Nothing Worsened by:  Nothing   Past Medical History:  Diagnosis Date  . HIV (human immunodeficiency virus infection) (HCC)   . HSV (herpes simplex virus) infection   . Hypertension   . Obesity    Past Surgical History:  Procedure Laterality Date  . INCISION AND DRAINAGE ABSCESS     on abdomen and buttocks   Family History  Problem Relation Age of Onset  . Heart disease Mother   . Hypertension Maternal Grandmother   . Diabetes Maternal Grandmother   . Heart disease Maternal Grandmother   . Cancer Maternal Grandmother   . Hypertension Paternal Grandmother   . Diabetes Paternal Grandmother   . Heart disease Paternal Grandmother    Social History  Substance Use Topics  . Smoking status: Never Smoker  . Smokeless tobacco: Never Used  . Alcohol use 1.2 oz/week    2 Standard drinks or equivalent per week     Comment: occasional   OB History    Gravida Para Term Preterm AB Living   4 3 3   1 3    SAB TAB Ectopic Multiple Live Births     1           Review of Systems  Constitutional: Negative.   HENT: Negative.   Eyes: Negative.   Respiratory: Negative.   Cardiovascular: Negative.   Gastrointestinal: Negative.   Endocrine: Negative.   Genitourinary: Positive for vaginal discharge.  Musculoskeletal: Negative.   Hematological: Negative.   Psychiatric/Behavioral: Negative.     Allergies  Dilaudid  [hydromorphone hcl]  Home Medications   Prior to Admission medications   Medication Sig Start Date End Date Taking? Authorizing Provider  Cetirizine HCl 10 MG CAPS Take 1 capsule (10 mg total) by mouth at bedtime as needed (congestion). 06/10/14   Forcucci, Courtney, PA-C  elvitegravir-cobicistat-emtricitabine-tenofovir (GENVOYA) 150-150-200-10 MG TABS tablet Take 1 tablet by mouth daily with breakfast. 12/11/16   Ginnie SmartHatcher, Jeffrey C, MD  metroNIDAZOLE (FLAGYL) 500 MG tablet Take 1 tablet (500 mg total) by mouth 2 (two) times daily. 12/28/16   Deatra Canterxford, William J, FNP   Meds Ordered and Administered this Visit  Medications - No data to display  BP 132/70 (BP Location: Right Arm)   Pulse 78   Temp 98.6 F (37 C) (Oral)   Resp 18   SpO2 100%  No data found.   Physical Exam  Constitutional: She appears well-developed and well-nourished.  HENT:  Head: Normocephalic and atraumatic.  Eyes: Conjunctivae and EOM are normal. Pupils are equal, round, and reactive to light.  Neck: Normal range of motion. Neck supple.  Cardiovascular: Normal rate, regular rhythm and normal heart sounds.   Pulmonary/Chest: Effort normal and breath sounds normal.  Nursing note and vitals reviewed.   Urgent Care Course     Procedures (including critical care time)  Labs Review Labs Reviewed - No data to display  Imaging Review No  results found.   Visual Acuity Review  Right Eye Distance:   Left Eye Distance:   Bilateral Distance:    Right Eye Near:   Left Eye Near:    Bilateral Near:         MDM   1. Trichimoniasis   2. BV (bacterial vaginosis)    Flagyl 500mg  one po bid x 7 days #14      Deatra Canter, FNP 12/28/16 1340

## 2017-01-01 ENCOUNTER — Inpatient Hospital Stay (HOSPITAL_COMMUNITY)
Admission: AD | Admit: 2017-01-01 | Discharge: 2017-01-01 | Disposition: A | Discharge status: Home / Self Care | Payer: Medicaid Other | Source: Ambulatory Visit | Attending: Family Medicine | Admitting: Family Medicine

## 2017-01-01 ENCOUNTER — Encounter (HOSPITAL_COMMUNITY): Payer: Self-pay | Admitting: *Deleted

## 2017-01-01 DIAGNOSIS — I10 Essential (primary) hypertension: Secondary | ICD-10-CM | POA: Insufficient documentation

## 2017-01-01 DIAGNOSIS — N938 Other specified abnormal uterine and vaginal bleeding: Secondary | ICD-10-CM

## 2017-01-01 DIAGNOSIS — E669 Obesity, unspecified: Secondary | ICD-10-CM | POA: Diagnosis not present

## 2017-01-01 DIAGNOSIS — B20 Human immunodeficiency virus [HIV] disease: Secondary | ICD-10-CM | POA: Insufficient documentation

## 2017-01-01 LAB — CBC
HCT: 39.7 % (ref 36.0–46.0)
Hemoglobin: 13.1 g/dL (ref 12.0–15.0)
MCH: 28.9 pg (ref 26.0–34.0)
MCHC: 33 g/dL (ref 30.0–36.0)
MCV: 87.4 fL (ref 78.0–100.0)
PLATELETS: 247 10*3/uL (ref 150–400)
RBC: 4.54 MIL/uL (ref 3.87–5.11)
RDW: 14.2 % (ref 11.5–15.5)
WBC: 3 10*3/uL — AB (ref 4.0–10.5)

## 2017-01-01 NOTE — Discharge Instructions (Signed)
Dysfunctional Uterine Bleeding °Dysfunctional uterine bleeding is abnormal bleeding from the uterus. Dysfunctional uterine bleeding includes: °· A period that comes earlier or later than usual. °· A period that is lighter, heavier, or has blood clots. °· Bleeding between periods. °· Skipping one or more periods. °· Bleeding after sexual intercourse. °· Bleeding after menopause. ° °Follow these instructions at home: °Pay attention to any changes in your symptoms. Follow these instructions to help with your condition: °Eating and drinking °· Eat well-balanced meals. Include foods that are high in iron, such as liver, meat, shellfish, green leafy vegetables, and eggs. °· If you become constipated: °? Drink plenty of water. °? Eat fruits and vegetables that are high in water and fiber, such as spinach, carrots, raspberries, apples, and mango. °Medicines °· Take over-the-counter and prescription medicines only as told by your health care provider. °· Do not change medicines without talking with your health care provider. °· Aspirin or medicines that contain aspirin may make the bleeding worse. Do not take those medicines: °? During the week before your period. °? During your period. °· If you were prescribed iron pills, take them as told by your health care provider. Iron pills help to replace iron that your body loses because of this condition. °Activity °· If you need to change your sanitary pad or tampon more than one time every 2 hours: °? Lie in bed with your feet raised (elevated). °? Place a cold pack on your lower abdomen. °? Rest as much as possible until the bleeding stops or slows down. °· Do not try to lose weight until the bleeding has stopped and your blood iron level is back to normal. °Other Instructions °· For two months, write down: °? When your period starts. °? When your period ends. °? When any abnormal bleeding occurs. °? What problems you notice. °· Keep all follow up visits as told by your health  care provider. This is important. °Contact a health care provider if: °· You get light-headed or weak. °· You have nausea and vomiting. °· You cannot eat or drink without vomiting. °· You feel dizzy or have diarrhea while you are taking medicines. °· You are taking birth control pills or hormones, and you want to change them or stop taking them. °Get help right away if: °· You develop a fever or chills. °· You need to change your sanitary pad or tampon more than one time per hour. °· Your bleeding becomes heavier, or your flow contains clots more often. °· You develop pain in your abdomen. °· You lose consciousness. °· You develop a rash. °This information is not intended to replace advice given to you by your health care provider. Make sure you discuss any questions you have with your health care provider. °Document Released: 06/08/2000 Document Revised: 11/17/2015 Document Reviewed: 09/06/2014 °Elsevier Interactive Patient Education © 2018 [employer] Inc[employer] ° °

## 2017-01-01 NOTE — MAU Provider Note (Signed)
Patient  Brenda Tyler is a 31 y.o. [phone] at Unknown non-pregnant female here with complaints of vaginal bleeding that started on Saturday. She denies dizziness, shortness of breath, passing clots or other gu-gyn symptoms.   Patient is here because she has not had a period in 8 years and was told she had early menopause. She is concerned about her bleeding and so she came to get checked out.   Patient's history is significant for HIV positive and recent treatment of BV and trichomoniasis.  History     CSN: [id number]  Arrival date and time: 01/01/17 1016   First Provider Initiated Contact with Patient 01/01/17 1323      Chief Complaint  Patient presents with  . Vaginal Bleeding   HPI  OB History    Gravida Para Term Preterm AB Living   4 3 3   1 3    SAB TAB Ectopic Multiple Live Births     1            Past Medical History:  Diagnosis Date  . HIV (human immunodeficiency virus infection) (HCC)   . HSV (herpes simplex virus) infection   . Hypertension   . Obesity     Past Surgical History:  Procedure Laterality Date  . INCISION AND DRAINAGE ABSCESS     on abdomen and buttocks    Family History  Problem Relation Age of Onset  . Heart disease Mother   . Hypertension Maternal Grandmother   . Diabetes Maternal Grandmother   . Heart disease Maternal Grandmother   . Cancer Maternal Grandmother   . Hypertension Paternal Grandmother   . Diabetes Paternal Grandmother   . Heart disease Paternal Grandmother     Social History  Substance Use Topics  . Smoking status: Never Smoker  . Smokeless tobacco: Never Used  . Alcohol use 1.2 oz/week    2 Standard drinks or equivalent per week     Comment: occasional    Allergies:  Allergies  Allergen Reactions  . Dilaudid [Hydromorphone Hcl] Other (See Comments)    Makes her dizzy    Prescriptions Prior to Admission  Medication Sig Dispense Refill Last Dose  . Cetirizine HCl 10 MG CAPS Take 1 capsule (10 mg total) by  mouth at bedtime as needed (congestion). 30 capsule 0 Taking  . elvitegravir-cobicistat-emtricitabine-tenofovir (GENVOYA) 150-150-200-10 MG TABS tablet Take 1 tablet by mouth daily with breakfast. 30 tablet 1   . metroNIDAZOLE (FLAGYL) 500 MG tablet Take 1 tablet (500 mg total) by mouth 2 (two) times daily. 14 tablet 0     Review of Systems  Respiratory: Negative.   Cardiovascular: Negative.   Gastrointestinal: Negative.   Genitourinary: Positive for vaginal bleeding.  Musculoskeletal: Negative.   Neurological: Negative.   Psychiatric/Behavioral: Negative.    Physical Exam   Blood pressure 126/90, pulse 67, temperature 98 F (36.7 C), temperature source Oral, resp. rate 18, weight 284 lb 4 oz (128.9 kg), last menstrual period 12/29/2016, SpO2 100 %.  Physical Exam  Constitutional: She is oriented to person, place, and time. She appears well-developed and well-nourished.  HENT:  Head: Normocephalic.  Neck: Normal range of motion.  Respiratory: Effort normal.  GI: Soft. She exhibits no distension and no mass. There is no tenderness. There is no rebound and no guarding.  Genitourinary:  Genitourinary Comments: NEFG; blood extruding from the cervix. No lesions on vaginal walls or cervix; no suprapubic, CMT or adnexal tenderness.   Musculoskeletal: Normal range of motion.  Neurological: She  is alert and oriented to person, place, and time. She has normal reflexes.  Skin: Skin is warm and dry.  Psychiatric: She has a normal mood and affect.    MAU Course  Procedures  MDM  -Vaginal exam benign; vitals stable and patient appears in no distress.  -Patient has many questions about whether or not this means that she can conceive and if she is still in early menopause Assessment and Plan   1. Dysfunctional uterine bleeding    2. Patient stable for discharge; reviewed bleeding precautions and when to return to the MAU 3. Message sent to Admin staff to schedule patient for follow-up  with an MD to discuss dysfunctional uterine bleeding and results of US.    Charlesetta GaribaldiKathryn Lorraine Ashtan Laton 01/01/2017, 1:36 PM

## 2017-01-01 NOTE — MAU Note (Signed)
Having a lot of bleeding. Had no period for like 8 yrs, was told ? Early menopause.  Started bleeding over weekend, wondering where it is coming from .

## 2017-01-02 ENCOUNTER — Telehealth: Payer: Self-pay | Admitting: *Deleted

## 2017-01-02 NOTE — Telephone Encounter (Signed)
Pt left message stating that she received a call about an appt. Please call back.

## 2017-01-11 ENCOUNTER — Ambulatory Visit (HOSPITAL_COMMUNITY)
Admission: RE | Admit: 2017-01-11 | Discharge: 2017-01-11 | Disposition: A | Discharge status: Home / Self Care | Payer: Medicaid Other | Source: Ambulatory Visit | Attending: Student | Admitting: Student

## 2017-01-11 DIAGNOSIS — R188 Other ascites: Secondary | ICD-10-CM | POA: Diagnosis not present

## 2017-01-11 DIAGNOSIS — N938 Other specified abnormal uterine and vaginal bleeding: Secondary | ICD-10-CM | POA: Insufficient documentation

## 2017-01-15 ENCOUNTER — Other Ambulatory Visit: Payer: Medicaid Other

## 2017-01-15 DIAGNOSIS — B20 Human immunodeficiency virus [HIV] disease: Secondary | ICD-10-CM

## 2017-01-15 DIAGNOSIS — Z113 Encounter for screening for infections with a predominantly sexual mode of transmission: Secondary | ICD-10-CM

## 2017-01-15 DIAGNOSIS — Z79899 Other long term (current) drug therapy: Secondary | ICD-10-CM

## 2017-01-16 LAB — LIPID PANEL
CHOL/HDL RATIO: 3.1 ratio (ref <5.0)
Cholesterol: 162 mg/dL (ref <200)
HDL: 53 mg/dL (ref >50)
LDL Cholesterol: 93 mg/dL (ref <100)
Triglycerides: 78 mg/dL (ref <150)
VLDL: 16 mg/dL (ref <30)

## 2017-01-16 LAB — COMPREHENSIVE METABOLIC PANEL
ALK PHOS: 110 U/L (ref 33–115)
ALT: 10 U/L (ref 6–29)
AST: 13 U/L (ref 10–30)
Albumin: 3.8 g/dL (ref 3.6–5.1)
BILIRUBIN TOTAL: 0.4 mg/dL (ref 0.2–1.2)
BUN: 10 mg/dL (ref 7–25)
CO2: 24 mmol/L (ref 20–31)
Calcium: 9.1 mg/dL (ref 8.6–10.2)
Chloride: 107 mmol/L (ref 98–110)
Creat: 0.82 mg/dL (ref 0.50–1.10)
GLUCOSE: 104 mg/dL — AB (ref 65–99)
Potassium: 3.8 mmol/L (ref 3.5–5.3)
SODIUM: 140 mmol/L (ref 135–146)
Total Protein: 7.4 g/dL (ref 6.1–8.1)

## 2017-01-16 LAB — T-HELPER CELL (CD4) - (RCID CLINIC ONLY)
CD4 % Helper T Cell: 25 % — ABNORMAL LOW (ref 33–55)
CD4 T Cell Abs: 350 /uL — ABNORMAL LOW (ref 400–2700)

## 2017-01-16 LAB — RPR

## 2017-01-18 ENCOUNTER — Encounter: Payer: Medicaid Other | Admitting: Obstetrics & Gynecology

## 2017-01-18 LAB — HIV-1 RNA QUANT-NO REFLEX-BLD
HIV 1 RNA Quant: 38 copies/mL — ABNORMAL HIGH
HIV-1 RNA Quant, Log: 1.58 Log copies/mL — ABNORMAL HIGH

## 2017-01-18 NOTE — Telephone Encounter (Signed)
Joni Reiningicole left a message with registar for us to call her and may leave a message. I called and talked with Joni ReiningNicole and she wanted results of US and cbc done in [place].  I gave her results and instructed her she still needs to keep her scheduled appointment to discuss her bleeding issues. I reviewed her appt with her. She voices understanding.

## 2017-01-30 ENCOUNTER — Ambulatory Visit: Payer: Medicaid Other | Admitting: Infectious Diseases

## 2017-01-30 ENCOUNTER — Ambulatory Visit (INDEPENDENT_AMBULATORY_CARE_PROVIDER_SITE_OTHER): Payer: Medicaid Other | Admitting: Obstetrics & Gynecology

## 2017-01-30 ENCOUNTER — Other Ambulatory Visit (HOSPITAL_COMMUNITY)
Admission: RE | Admit: 2017-01-30 | Discharge: 2017-01-30 | Disposition: A | Discharge status: Home / Self Care | Payer: Medicaid Other | Source: Ambulatory Visit | Attending: Obstetrics & Gynecology | Admitting: Obstetrics & Gynecology

## 2017-01-30 ENCOUNTER — Encounter: Payer: Self-pay | Admitting: Obstetrics & Gynecology

## 2017-01-30 VITALS — BP 110/78 | HR 72 | Ht 64.0 in | Wt 285.2 lb

## 2017-01-30 DIAGNOSIS — R87613 High grade squamous intraepithelial lesion on cytologic smear of cervix (HGSIL): Secondary | ICD-10-CM | POA: Diagnosis not present

## 2017-01-30 DIAGNOSIS — Z124 Encounter for screening for malignant neoplasm of cervix: Secondary | ICD-10-CM | POA: Diagnosis not present

## 2017-01-30 DIAGNOSIS — Z1151 Encounter for screening for human papillomavirus (HPV): Secondary | ICD-10-CM | POA: Diagnosis not present

## 2017-01-30 DIAGNOSIS — Z113 Encounter for screening for infections with a predominantly sexual mode of transmission: Secondary | ICD-10-CM

## 2017-01-30 DIAGNOSIS — R87810 Cervical high risk human papillomavirus (HPV) DNA test positive: Secondary | ICD-10-CM | POA: Insufficient documentation

## 2017-01-30 DIAGNOSIS — N911 Secondary amenorrhea: Secondary | ICD-10-CM | POA: Diagnosis not present

## 2017-01-30 MED ORDER — MEDROXYPROGESTERONE ACETATE 10 MG PO TABS: 10.0000 mg | ORAL_TABLET | Freq: Every day | ORAL | 6 refills | Status: DC

## 2017-01-30 MED ORDER — MEDROXYPROGESTERONE ACETATE 10 MG PO TABS: 10.0000 mg | ORAL_TABLET | Freq: Every day | ORAL | 2 refills | Status: DC

## 2017-01-30 NOTE — Patient Instructions (Signed)
Return to clinic for any scheduled appointments or obstetric concerns, or go to MAU for evaluation  

## 2017-01-30 NOTE — Progress Notes (Signed)
GYNECOLOGY OFFICE VISIT NOTE  History:  31 y.o. [id number] here today for follow up of AUB for which she was seen in MAU recently. Had eight years of amenorrhea, then bleeding last week. No further bleeding. 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  She denies any abnormal vaginal discharge, bleeding, pelvic pain or other concerns.   Past Medical History:  Diagnosis Date  . HIV (human immunodeficiency virus infection) (HCC)   . HSV (herpes simplex virus) infection   . Hypertension   . Obesity     Past Surgical History:  Procedure Laterality Date  . INCISION AND DRAINAGE ABSCESS     on abdomen and buttocks    The following portions of the patient's history were reviewed and updated as appropriate: allergies, current medications, past family history, past medical history, past social history, past surgical history and problem list.    Review of Systems:  Pertinent items noted in HPI and remainder of comprehensive ROS otherwise negative.   Objective:  Physical Exam BP 110/78   Pulse 72   Ht 5\' 4"  (1.626 m)   Wt 285 lb 3.2 oz (129.4 kg)   BMI 48.95 kg/m  CONSTITUTIONAL: Well-developed, well-nourished female in no acute distress.  HENT:  Normocephalic, atraumatic. External right and left ear normal. Oropharynx is clear and moist EYES: Conjunctivae and EOM are normal. Pupils are equal, round, and reactive to light. No scleral icterus.  NECK: Normal range of motion, supple, no masses SKIN: Skin is warm and dry. No rash noted. Not diaphoretic. No erythema. No pallor. NEUROLOGIC: Alert and oriented to person, place, and time. Normal reflexes, muscle tone coordination. No cranial nerve deficit noted. PSYCHIATRIC: Normal mood and affect. Normal behavior. Normal judgment and thought content. CARDIOVASCULAR: Normal heart rate noted RESPIRATORY: Effort and breath sounds normal, no problems with respiration noted ABDOMEN: Soft, obese, no distention noted.    PELVIC: Normal appearing external genitalia; normal appearing vaginal mucosa and cervix.  No abnormal discharge noted.  Pap done.  Normal uterine size, no other palpable masses, no uterine or adnexal tenderness. MUSCULOSKELETAL: Normal range of motion. No edema noted.  Labs and Imaging US Transvaginal Non-ob  Result Date: 01/11/2017 CLINICAL DATA:  Bleeding EXAM: TRANSABDOMINAL AND TRANSVAGINAL ULTRASOUND OF PELVIS TECHNIQUE: Both transabdominal and transvaginal ultrasound examinations of the pelvis were performed. Transabdominal technique was performed for global imaging of the pelvis including uterus, ovaries, adnexal regions, and pelvic cul-de-sac. It was necessary to proceed with endovaginal exam following the transabdominal exam to visualize the endometrium. COMPARISON:  None FINDINGS: Uterus Measurements: 5.5 x 3.0 x 4.6 cm. Retroverted. No fibroids or other mass visualized. Endometrium Thickness: 4 mm.  No focal abnormality visualized. Right ovary Measurements: 2.1 x 1.4 x 2.0 cm. Normal appearance/no adnexal mass. Left ovary Measurements: 2.1 x 1.3 x 1.6 cm. Normal appearance/no adnexal mass. Other findings Small volume pelvic ascites. IMPRESSION: Negative pelvic ultrasound. Electronically Signed   By: Charline Bills M.D.   On: 01/11/2017 15:40   US Pelvis Complete  Result Date: 01/11/2017 CLINICAL DATA:  Bleeding EXAM: TRANSABDOMINAL AND TRANSVAGINAL ULTRASOUND OF PELVIS TECHNIQUE: Both transabdominal and transvaginal ultrasound examinations of the pelvis were performed. Transabdominal technique was performed for global imaging of the pelvis including uterus, ovaries, adnexal regions, and pelvic cul-de-sac. It was necessary to proceed with endovaginal exam following the transabdominal exam to visualize the endometrium. COMPARISON:  None FINDINGS: Uterus Measurements: 5.5 x 3.0 x 4.6 cm. Retroverted. No fibroids or other mass  visualized. Endometrium Thickness: 4 mm.  No focal abnormality  visualized. Right ovary Measurements: 2.1 x 1.4 x 2.0 cm. Normal appearance/no adnexal mass. Left ovary Measurements: 2.1 x 1.3 x 1.6 cm. Normal appearance/no adnexal mass. Other findings Small volume pelvic ascites. IMPRESSION: Negative pelvic ultrasound. Electronically Signed   By: Charline BillsSriyesh  Krishnan M.D.   On: 01/11/2017 15:40    Assessment & Plan:  1. Secondary Amenorrhea Ultrasound results discussed with patient.  She feels she has been told she is menopausal, labs ordered to evaluate this. Provera given for inducing bleeding q3-4 months given prolonged period of amenorrhea.  - Follicle stimulating hormone - Anti mullerian hormone - medroxyPROGESTERone (PROVERA) 10 MG tablet; Take 1 tablet (10 mg total) by mouth daily. Use for ten days  Dispense: 10 tablet; Refill: 6  2. HGSIL on Pap smear of cervix - Cytology - PAP done today.  Routine preventative health maintenance measures emphasized. Please refer to After Visit Summary for other counseling recommendations.   Return if symptoms worsen or fail to improve.   Total face-to-face time with patient: 25 minutes. Over 50% of encounter was spent on counseling and coordination of care.   Jaynie CollinsUGONNA  Talor Cheema, MD, FACOG Attending Obstetrician & Gynecologist, [facility], [facility]

## 2017-01-30 NOTE — Progress Notes (Signed)
States that she is not bleeding at all.

## 2017-02-03 LAB — FOLLICLE STIMULATING HORMONE: FSH: 47.6 m[IU]/mL

## 2017-02-03 LAB — ANTI MULLERIAN HORMONE

## 2017-02-03 NOTE — Progress Notes (Signed)
Results are consistent with menopause and very low ovarian function.  No need to use Provera to induce bleeding episodes. If bleeding recurs, she will need further evaluation for postmenopausal bleeding. Results were released to MyChart and patient was given recommendations as indicated.

## 2017-02-04 LAB — CYTOLOGY - PAP
BACTERIAL VAGINITIS: NEGATIVE
CANDIDA VAGINITIS: NEGATIVE
Chlamydia: NEGATIVE
DIAGNOSIS: NEGATIVE
HPV (WINDOPATH): DETECTED — AB
HPV 16/18/45 GENOTYPING: NEGATIVE
Neisseria Gonorrhea: NEGATIVE
Trichomonas: NEGATIVE

## 2017-03-27 ENCOUNTER — Telehealth: Payer: Self-pay | Admitting: *Deleted

## 2017-03-27 NOTE — Telephone Encounter (Signed)
Patient left voicemail message on nurse line at 1311.  Patient is requesting test results.  States she tried to see them in My Chart.  Phone call to patient.  Results given from 01/30/17 visit.  Patient asks what she should do if she wants to have more children.  Encouraged patient to make appointment with Dr. April Manson.  Office address and phone number given.  Patient states she has Medicaid and knows they won't pay for it.  I suggested patient contact Dr. Lyndal Rainbow office if she wanted to look into having another child.  I told her they would expect her to pay something before being seen.  I explained it would be better for her to have that conversation with their office.  Encouraged to call us back if she decides to see Dr. April Manson and we can send her records to him.  Patient states understanding.

## 2017-04-13 ENCOUNTER — Other Ambulatory Visit: Payer: Self-pay | Admitting: Infectious Diseases

## 2017-04-13 DIAGNOSIS — B20 Human immunodeficiency virus [HIV] disease: Secondary | ICD-10-CM

## 2017-04-20 ENCOUNTER — Other Ambulatory Visit: Payer: Self-pay | Admitting: Infectious Diseases

## 2017-04-20 DIAGNOSIS — B20 Human immunodeficiency virus [HIV] disease: Secondary | ICD-10-CM

## 2017-05-08 ENCOUNTER — Ambulatory Visit: Payer: Self-pay | Admitting: Infectious Diseases

## 2017-08-28 ENCOUNTER — Other Ambulatory Visit (HOSPITAL_COMMUNITY)
Admission: RE | Admit: 2017-08-28 | Discharge: 2017-08-28 | Disposition: A | Discharge status: Home / Self Care | Payer: BC Managed Care – PPO | Source: Ambulatory Visit | Attending: Infectious Diseases | Admitting: Infectious Diseases

## 2017-08-28 ENCOUNTER — Ambulatory Visit (INDEPENDENT_AMBULATORY_CARE_PROVIDER_SITE_OTHER): Payer: BC Managed Care – PPO | Admitting: Pharmacist Clinician (PhC)/ Clinical Pharmacy Specialist

## 2017-08-28 VITALS — Wt 290.0 lb

## 2017-08-28 DIAGNOSIS — Z23 Encounter for immunization: Secondary | ICD-10-CM | POA: Diagnosis not present

## 2017-08-28 DIAGNOSIS — B20 Human immunodeficiency virus [HIV] disease: Secondary | ICD-10-CM | POA: Insufficient documentation

## 2017-08-28 MED ORDER — DARUN-COBIC-EMTRICIT-TENOFAF 800-150-200-10 MG PO TABS: 1.0000 | ORAL_TABLET | Freq: Every day | ORAL | 2 refills | Status: DC

## 2017-08-28 MED FILL — SYMTUZA 800-150-200-10 MG T: 800-150-200 | 30 days supply | Qty: 30 | Fill #0

## 2017-08-28 NOTE — Patient Instructions (Signed)
[facility] [address], [place], [place] [address] Phone: [phone]  Dr. Deatra JamesVYVYAN SUN, MD

## 2017-08-28 NOTE — Progress Notes (Signed)
HPI: FIZA NATION is a 32 y.o. female who is here to see pharmacy after no being here since 3/17.  Allergies: Allergies  Allergen Reactions  . Dilaudid [Hydromorphone Hcl] Other (See Comments)    Makes her dizzy    Vitals:    Past Medical History: Past Medical History:  Diagnosis Date  . HIV (human immunodeficiency virus infection) (HCC)   . HSV (herpes simplex virus) infection   . Hypertension   . Obesity     Social History: Social History   Socioeconomic History  . Marital status: Single    Spouse name: Not on file  . Number of children: Not on file  . Years of education: Not on file  . Highest education level: Not on file  Social Needs  . Financial resource strain: Not on file  . Food insecurity - worry: Not on file  . Food insecurity - inability: Not on file  . Transportation needs - medical: Not on file  . Transportation needs - non-medical: Not on file  Occupational History  . Not on file  Tobacco Use  . Smoking status: Never Smoker  . Smokeless tobacco: Never Used  Substance and Sexual Activity  . Alcohol use: Yes    Alcohol/week: 1.2 oz    Types: 2 Standard drinks or equivalent per week    Comment: occasional  . Drug use: No    Comment: pt states no drugs  . Sexual activity: Yes    Birth control/protection: None    Comment: pt. given condoms  Other Topics Concern  . Not on file  Social History Narrative  . Not on file    Previous Regimen: Stribild>>Genvoya  Current Regimen: None  Labs: HIV 1 RNA Quant (copies/mL)  Date Value  01/15/2017 38 (H)  07/08/2015 11,759 (H)  07/02/2014 6,119 (H)   CD4 T Cell Abs (/uL)  Date Value  01/15/2017 350 (L)  07/08/2015 200 (L)  07/02/2014 100 (L)   Hep B S Ab (no units)  Date Value  04/06/2014 POS (A)   Hepatitis B Surface Ag (no units)  Date Value  04/06/2014 NEGATIVE   HCV Ab (no units)  Date Value  04/06/2014 NEGATIVE    CrCl: CrCl cannot be calculated (Patient's most recent lab  result is older than the maximum 21 days allowed.).  Lipids:    Component Value Date/Time   CHOL 162 01/15/2017 1457   TRIG 78 01/15/2017 1457   HDL 53 01/15/2017 1457   CHOLHDL 3.1 01/15/2017 1457   VLDL 16 01/15/2017 1457   LDLCALC 93 01/15/2017 1457    Assessment: Lesley has not seen here since 2017. She has a hx of multiple no shows. She was dx sometime in 2015. Her adherence hx has been terrible. She said that she has been off meds for a while. Multiple issues have attributed to this poor adherence (depression, stress, ect). Spent a good amount of time going over why untreated HIV can lead to significant risks including OIs, nephropathy, CA, ect. She believed that she was infected by the father of her kids. She is not longer with him. She became a little emotional and stating that she is here today so that she can get her health better. We tried to get her to meet with one of our counselors but she is not here today. Shontell also said that she rather not because she doesn't like those sessions.   She complained of some side effect issues with her Genvoya. It was mainly  due to GI related. 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 She would like to restart therapy today. Due to her poor adherence hx, we will restart her on Symtuza while waiting for labs.   She stated that she has Medicaid but recently signed up for insurance through [employer] [facility] [employer]. It's the state teacher's health plan. Symtuza requires a PA for it, however, her Medicaid will cover without issue. We will continue to go through Medicaid until they kick her off.   She is hep A ab neg so we will start the series today. She agreed to the flu and Menveo shots today. She will stop by the pharmacy to pick up her Symtuza today. I will continue to see her for the next few months until she stabilizes with therapy. She will then f/u with Dr. Ninetta LightsHatcher.   Recommendations:  HIV VL reflex,  CD4, STDs today Hep A #1, Menveo #1, flu today Start Symtuza 1 PO qday F/u back with pharmacy in 1 month  Ulyses SouthwardMinh Ovadia Lopp, PharmD, BCPS, AAHIVP, CPP Clinical Infectious Disease Pharmacist [facility] 08/28/2017, 6:51 PM

## 2017-08-29 LAB — BASIC METABOLIC PANEL
BUN: 12 mg/dL (ref 7–25)
CALCIUM: 9.6 mg/dL (ref 8.6–10.2)
CHLORIDE: 104 mmol/L (ref 98–110)
CO2: 27 mmol/L (ref 20–32)
Creat: 0.85 mg/dL (ref 0.50–1.10)
Glucose, Bld: 88 mg/dL (ref 65–99)
POTASSIUM: 4 mmol/L (ref 3.5–5.3)
SODIUM: 139 mmol/L (ref 135–146)

## 2017-08-29 LAB — RPR: RPR: NONREACTIVE

## 2017-08-29 LAB — CBC

## 2017-08-29 LAB — CYTOLOGY, (ORAL, ANAL, URETHRAL) ANCILLARY ONLY
Chlamydia: NEGATIVE
NEISSERIA GONORRHEA: NEGATIVE

## 2017-08-30 LAB — T-HELPER CELL (CD4) - (RCID CLINIC ONLY)
CD4 % Helper T Cell: 20 % — ABNORMAL LOW (ref 33–55)
CD4 T Cell Abs: 240 /uL — ABNORMAL LOW (ref 400–2700)

## 2017-08-31 LAB — URINE CYTOLOGY ANCILLARY ONLY
Chlamydia: NEGATIVE
Neisseria Gonorrhea: NEGATIVE

## 2017-09-13 LAB — HIV-1 INTEGRASE GENOTYPE

## 2017-09-13 LAB — HIV-1 GENOTYPE

## 2017-09-13 LAB — HIV RNA, RTPCR W/R GT (RTI, PI,INT)
HIV 1 RNA Quant: 10800 copies/mL — ABNORMAL HIGH
HIV-1 RNA QUANT, LOG: 4.03 {Log_copies}/mL — AB

## 2017-09-20 LAB — HIV-1 INTEGRASE GENOTYPE

## 2017-09-25 ENCOUNTER — Telehealth: Payer: Self-pay | Admitting: Pharmacist Clinician (PhC)/ Clinical Pharmacy Specialist

## 2017-09-25 NOTE — Telephone Encounter (Signed)
She got new insurance now and Darrell JewelSymtuza is not covered. Will do a PA to keep her on Symtuza.

## 2017-09-30 ENCOUNTER — Telehealth: Payer: Self-pay | Admitting: General Practice

## 2017-09-30 NOTE — Telephone Encounter (Signed)
Patient called and left message on nurse line stating she has a question about her pap smear. Called patient and she states she needs her pap smear results sent to her infertility doctor. Told patient she will need to sign a ROI then we can send that to them. Patient verbalized understanding & states she will come by tomorrow. Patient had no other questions

## 2017-10-03 ENCOUNTER — Ambulatory Visit (INDEPENDENT_AMBULATORY_CARE_PROVIDER_SITE_OTHER): Payer: BC Managed Care – PPO | Admitting: Pharmacist Clinician (PhC)/ Clinical Pharmacy Specialist

## 2017-10-03 DIAGNOSIS — B20 Human immunodeficiency virus [HIV] disease: Secondary | ICD-10-CM | POA: Diagnosis not present

## 2017-10-03 NOTE — Progress Notes (Signed)
HPI: Brenda Tyler is a 32 y.o. female who is here to see pharmacy for her HIV adherence.   Allergies: Allergies  Allergen Reactions  . Dilaudid [Hydromorphone Hcl] Other (See Comments)    Makes her dizzy    Vitals:    Past Medical History: Past Medical History:  Diagnosis Date  . HIV (human immunodeficiency virus infection) (HCC)   . HSV (herpes simplex virus) infection   . Hypertension   . Obesity     Social History: Social History   Socioeconomic History  . Marital status: Single    Spouse name: Not on file  . Number of children: Not on file  . Years of education: Not on file  . Highest education level: Not on file  Occupational History  . Not on file  Social Needs  . Financial resource strain: Not on file  . Food insecurity:    Worry: Not on file    Inability: Not on file  . Transportation needs:    Medical: Not on file    Non-medical: Not on file  Tobacco Use  . Smoking status: Never Smoker  . Smokeless tobacco: Never Used  Substance and Sexual Activity  . Alcohol use: Yes    Alcohol/week: 1.2 oz    Types: 2 Standard drinks or equivalent per week    Comment: occasional  . Drug use: No    Frequency: 2.0 times per week    Types: Marijuana    Comment: pt states no drugs  . Sexual activity: Yes    Birth control/protection: None    Comment: pt. given condoms  Lifestyle  . Physical activity:    Days per week: Not on file    Minutes per session: Not on file  . Stress: Not on file  Relationships  . Social connections:    Talks on phone: Not on file    Gets together: Not on file    Attends religious service: Not on file    Active member of club or organization: Not on file    Attends meetings of clubs or organizations: Not on file    Relationship status: Not on file  Other Topics Concern  . Not on file  Social History Narrative  . Not on file    Previous Regimen: Stribild>>Genvoya  Current Regimen: Symtuza  Labs: HIV 1 RNA Quant  (copies/mL)  Date Value  08/28/2017 10,800 (H)  01/15/2017 38 (H)  07/08/2015 11,759 (H)   CD4 T Cell Abs (/uL)  Date Value  08/28/2017 240 (L)  01/15/2017 350 (L)  07/08/2015 200 (L)   Hep B S Ab (no units)  Date Value  04/06/2014 POS (A)   Hepatitis B Surface Ag (no units)  Date Value  04/06/2014 NEGATIVE   HCV Ab (no units)  Date Value  04/06/2014 NEGATIVE    CrCl: CrCl cannot be calculated (Patient's most recent lab result is older than the maximum 21 days allowed.).  Lipids:    Component Value Date/Time   CHOL 162 01/15/2017 1457   TRIG 78 01/15/2017 1457   HDL 53 01/15/2017 1457   CHOLHDL 3.1 01/15/2017 1457   VLDL 16 01/15/2017 1457   LDLCALC 93 01/15/2017 1457    Assessment: Brenda Tyler saw [place] a month ago to restart her antiretrovirals. We put her on Symtuza due to her poor adherence history. She has done well on the med so far but she did missed 2 doses due to stress in her life with kids, depression, ect. She takes  symtuza around 5-6 every night. Some days she forgot due to the meal issue. Advised her to take it on those days and not skipping dose. Gave her a key chain pill box today. She said that her boyfriend is thinking about having a kid together. Counseled her on the importance of being suppressed. It's not a concrete plan right now. Apparently, he is HIV negative. We will bring him in for PrEP. He does have Medicaid. He will be scheduled to come back soon. Pharmacy will see her until she is suppressed on therapy.   Recommendations:  Continue Symtuza 1 PO qday HIV VL and CD4 F/u next month  Ulyses SouthwardMinh Annamaria Salah, PharmD, BCPS, AAHIVP, CPP Clinical Infectious Disease Pharmacist [facility] 10/03/2017, 4:44 PM

## 2017-10-04 LAB — T-HELPER CELL (CD4) - (RCID CLINIC ONLY)
CD4 % Helper T Cell: 22 % — ABNORMAL LOW (ref 33–55)
CD4 T Cell Abs: 340 /uL — ABNORMAL LOW (ref 400–2700)

## 2017-10-07 LAB — HIV-1 RNA QUANT-NO REFLEX-BLD
HIV 1 RNA QUANT: 187 {copies}/mL — AB
HIV-1 RNA Quant, Log: 2.27 Log copies/mL — ABNORMAL HIGH

## 2017-10-09 ENCOUNTER — Other Ambulatory Visit: Payer: Self-pay | Admitting: Pharmacist

## 2017-10-09 MED FILL — SYMTUZA 800-150-200-10 MG T: 800-150-200 | 30 days supply | Qty: 30 | Fill #1

## 2017-10-31 ENCOUNTER — Ambulatory Visit (INDEPENDENT_AMBULATORY_CARE_PROVIDER_SITE_OTHER): Payer: BC Managed Care – PPO | Admitting: Pharmacist Clinician (PhC)/ Clinical Pharmacy Specialist

## 2017-10-31 DIAGNOSIS — B20 Human immunodeficiency virus [HIV] disease: Secondary | ICD-10-CM

## 2017-10-31 DIAGNOSIS — Z23 Encounter for immunization: Secondary | ICD-10-CM | POA: Diagnosis not present

## 2017-10-31 MED ORDER — DARUN-COBIC-EMTRICIT-TENOFAF 800-150-200-10 MG PO TABS: 1.0000 | ORAL_TABLET | Freq: Every day | ORAL | 5 refills | Status: DC

## 2017-10-31 NOTE — Progress Notes (Signed)
HPI: Brenda Tyler is a 32 y.o. female who is here to see pharmacy for her adherence   Allergies: Allergies  Allergen Reactions  . Dilaudid [Hydromorphone Hcl] Other (See Comments)    Makes her dizzy    Vitals:    Past Medical History: Past Medical History:  Diagnosis Date  . HIV (human immunodeficiency virus infection) (HCC)   . HSV (herpes simplex virus) infection   . Hypertension   . Obesity     Social History: Social History   Socioeconomic History  . Marital status: Single    Spouse name: Not on file  . Number of children: Not on file  . Years of education: Not on file  . Highest education level: Not on file  Occupational History  . Not on file  Social Needs  . Financial resource strain: Not on file  . Food insecurity:    Worry: Not on file    Inability: Not on file  . Transportation needs:    Medical: Not on file    Non-medical: Not on file  Tobacco Use  . Smoking status: Never Smoker  . Smokeless tobacco: Never Used  Substance and Sexual Activity  . Alcohol use: Yes    Alcohol/week: 1.2 oz    Types: 2 Standard drinks or equivalent per week    Comment: occasional  . Drug use: No    Frequency: 2.0 times per week    Types: Marijuana    Comment: pt states no drugs  . Sexual activity: Yes    Birth control/protection: None    Comment: pt. given condoms  Lifestyle  . Physical activity:    Days per week: Not on file    Minutes per session: Not on file  . Stress: Not on file  Relationships  . Social connections:    Talks on phone: Not on file    Gets together: Not on file    Attends religious service: Not on file    Active member of club or organization: Not on file    Attends meetings of clubs or organizations: Not on file    Relationship status: Not on file  Other Topics Concern  . Not on file  Social History Narrative  . Not on file    Previous Regimen: Stribild>>Genvoya  Current Regimen: Symtuza  Labs: HIV 1 RNA Quant (copies/mL)   Date Value  10/03/2017 187 (H)  08/28/2017 10,800 (H)  01/15/2017 38 (H)   CD4 T Cell Abs (/uL)  Date Value  10/03/2017 340 (L)  08/28/2017 240 (L)  01/15/2017 350 (L)   Hep B S Ab (no units)  Date Value  04/06/2014 POS (A)   Hepatitis B Surface Ag (no units)  Date Value  04/06/2014 NEGATIVE   HCV Ab (no units)  Date Value  04/06/2014 NEGATIVE    CrCl: CrCl cannot be calculated (Patient's most recent lab result is older than the maximum 21 days allowed.).  Lipids:    Component Value Date/Time   CHOL 162 01/15/2017 1457   TRIG 78 01/15/2017 1457   HDL 53 01/15/2017 1457   CHOLHDL 3.1 01/15/2017 1457   VLDL 16 01/15/2017 1457   LDLCALC 93 01/15/2017 1457    Assessment: Chen is doing very well on symtuza. She is almost suppressed now. Could do the integrase test in March but not the regular genotype for some reason. She is doing fine on Symtuza other than some PRN headaches. She was upset with me for not calling her about her  VL. I apologized and promised to call her in a couple of days with the results. She would be suppressed by now since she denied any missing dose. Set her up with an appt with Dr. Ninetta Lights in June. We will give her the second Menveo today.   Her boyfriend missed his initial PrEP visit. He is insured and she will reschedule with Claris Che today before leaving today.   Recommendations:  Continue Symtuza 1 daily HIV labs today Menveo #2 today F/u with Dr. Ninetta Lights in June  Ulyses Southward, PharmD, BCPS, AAHIVP, CPP Clinical Infectious Disease Pharmacist [facility] 10/31/2017, 4:24 PM

## 2017-11-01 LAB — T-HELPER CELL (CD4) - (RCID CLINIC ONLY)
CD4 T CELL ABS: 330 /uL — AB (ref 400–2700)
CD4 T CELL HELPER: 21 % — AB (ref 33–55)

## 2017-11-03 LAB — HIV-1 RNA QUANT-NO REFLEX-BLD
HIV 1 RNA QUANT: 69 {copies}/mL — AB
HIV-1 RNA QUANT, LOG: 1.84 {Log_copies}/mL — AB

## 2017-11-05 ENCOUNTER — Telehealth: Payer: Self-pay | Admitting: Pharmacist Clinician (PhC)/ Clinical Pharmacy Specialist

## 2017-11-05 NOTE — Telephone Encounter (Signed)
Brenda Tyler wanted [place] to call her back to give her the results of her VL. It's now down to 69, Left her a generic VM the improved number and to call back if she wants the results.

## 2017-11-06 ENCOUNTER — Other Ambulatory Visit: Payer: Self-pay | Admitting: Pharmacist

## 2017-11-06 DIAGNOSIS — B20 Human immunodeficiency virus [HIV] disease: Secondary | ICD-10-CM

## 2017-11-06 MED ORDER — DARUN-COBIC-EMTRICIT-TENOFAF 800-150-200-10 MG PO TABS
1.0000 | ORAL_TABLET | Freq: Every day | ORAL | 5 refills | Status: DC
Start: 2017-11-06 — End: 2017-11-06

## 2017-11-06 MED ORDER — DARUN-COBIC-EMTRICIT-TENOFAF 800-150-200-10 MG PO TABS: 1.0000 | ORAL_TABLET | Freq: Every day | ORAL | 5 refills | Status: DC

## 2017-11-06 NOTE — Progress Notes (Signed)
[facility] unable to fill. Will send to [facility] in [place] to see if they can fill.

## 2017-11-06 NOTE — Progress Notes (Signed)
[facility] cannot fill either. Patient must go to [facility]. Will send to the one in [place], [place].

## 2017-12-03 ENCOUNTER — Ambulatory Visit: Payer: BC Managed Care – PPO | Admitting: Infectious Diseases

## 2017-12-24 ENCOUNTER — Ambulatory Visit: Payer: BC Managed Care – PPO | Admitting: Infectious Disease

## 2017-12-25 ENCOUNTER — Ambulatory Visit: Payer: BC Managed Care – PPO | Admitting: Infectious Diseases

## 2018-01-10 ENCOUNTER — Ambulatory Visit (INDEPENDENT_AMBULATORY_CARE_PROVIDER_SITE_OTHER): Payer: BC Managed Care – PPO | Admitting: Infectious Diseases

## 2018-01-10 VITALS — BP 126/85 | HR 69 | Temp 97.8°F | Wt 302.0 lb

## 2018-01-10 DIAGNOSIS — R87613 High grade squamous intraepithelial lesion on cytologic smear of cervix (HGSIL): Secondary | ICD-10-CM | POA: Diagnosis not present

## 2018-01-10 DIAGNOSIS — Z79899 Other long term (current) drug therapy: Secondary | ICD-10-CM

## 2018-01-10 DIAGNOSIS — Z6841 Body Mass Index (BMI) 40.0 and over, adult: Secondary | ICD-10-CM

## 2018-01-10 DIAGNOSIS — Z23 Encounter for immunization: Secondary | ICD-10-CM | POA: Diagnosis not present

## 2018-01-10 DIAGNOSIS — Z113 Encounter for screening for infections with a predominantly sexual mode of transmission: Secondary | ICD-10-CM

## 2018-01-10 DIAGNOSIS — B20 Human immunodeficiency virus [HIV] disease: Secondary | ICD-10-CM | POA: Diagnosis not present

## 2018-01-10 NOTE — Assessment & Plan Note (Signed)
Doing well Will recheck her labs today Will give her PCV Send her for mammo for breast pain (prob from wt gain).  Will get her PAP (has hx of HPV and abn pap).  Offered/refused condoms.  rtc in 6 months.

## 2018-01-10 NOTE — Addendum Note (Signed)
Addended by: Gildardo GriffesHILL, Javin Nong M on: 01/10/2018 12:48 PM   Modules accepted: Orders

## 2018-01-10 NOTE — Progress Notes (Signed)
   Subjective:    Patient ID: Brenda Tyler, female    DOB: 01/27/1986, 32 y.o.   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 Previously started on bactrim and stribild ---> symtuza. She has had periods of intermittent adherence. Missed her dose yesterday.  She has had several pharm visits this year.  Has been having breast aching. Knots under breast.  Also having cramping in her hands. Works for [employer], always on feet and working with her hands.  Worried about wt gain. Has been eating more. Has been exercising.  Has not had pap yet this year.   HIV 1 RNA Quant (copies/mL)  Date Value  10/31/2017 69 (H)  10/03/2017 187 (H)  08/28/2017 10,800 (H)   CD4 T Cell Abs (/uL)  Date Value  10/31/2017 330 (L)  10/03/2017 340 (L)  08/28/2017 240 (L)    Review of Systems  Constitutional: Positive for unexpected weight change. Negative for appetite change.  Gastrointestinal: Negative for constipation and diarrhea.  Endocrine: Positive for polydipsia and polyuria.  Genitourinary: Negative for difficulty urinating.  Please see HPI. All other systems reviewed and negative.      Objective:   Physical Exam  Constitutional: She is oriented to person, place, and time. She appears well-developed and well-nourished.  HENT:  Mouth/Throat: No oropharyngeal exudate.  Eyes: Pupils are equal, round, and reactive to light. EOM are normal.  Neck: Normal range of motion. Neck supple.  Cardiovascular: Normal rate, regular rhythm and normal heart sounds.  Pulmonary/Chest: Effort normal and breath sounds normal.  Abdominal: Soft. Bowel sounds are normal. There is no tenderness. There is no guarding.  Musculoskeletal: She exhibits edema.  Lymphadenopathy:    She has no cervical adenopathy.  Neurological: She is alert and oriented to person, place, and time.          Assessment & Plan:

## 2018-01-10 NOTE — Assessment & Plan Note (Signed)
Encouraged her to diet and exercise.  Will check her A1C due to urine sx.

## 2018-01-10 NOTE — Assessment & Plan Note (Signed)
Will repeat her PAP

## 2018-01-11 LAB — CBC
HEMATOCRIT: 35.4 % (ref 35.0–45.0)
HEMOGLOBIN: 12.2 g/dL (ref 11.7–15.5)
MCH: 29.5 pg (ref 27.0–33.0)
MCHC: 34.5 g/dL (ref 32.0–36.0)
MCV: 85.5 fL (ref 80.0–100.0)
MPV: 10.7 fL (ref 7.5–12.5)
Platelets: 297 10*3/uL (ref 140–400)
RBC: 4.14 10*6/uL (ref 3.80–5.10)
RDW: 12.8 % (ref 11.0–15.0)
WBC: 3.6 10*3/uL — AB (ref 3.8–10.8)

## 2018-01-11 LAB — COMPREHENSIVE METABOLIC PANEL
AG RATIO: 1.1 (calc) (ref 1.0–2.5)
ALBUMIN MSPROF: 3.8 g/dL (ref 3.6–5.1)
ALKALINE PHOSPHATASE (APISO): 108 U/L (ref 33–115)
ALT: 9 U/L (ref 6–29)
AST: 12 U/L (ref 10–30)
BILIRUBIN TOTAL: 0.2 mg/dL (ref 0.2–1.2)
BUN: 13 mg/dL (ref 7–25)
CALCIUM: 9.1 mg/dL (ref 8.6–10.2)
CHLORIDE: 108 mmol/L (ref 98–110)
CO2: 27 mmol/L (ref 20–32)
Creat: 0.84 mg/dL (ref 0.50–1.10)
Globulin: 3.5 g/dL (calc) (ref 1.9–3.7)
Glucose, Bld: 93 mg/dL (ref 65–99)
POTASSIUM: 4.3 mmol/L (ref 3.5–5.3)
SODIUM: 140 mmol/L (ref 135–146)
TOTAL PROTEIN: 7.3 g/dL (ref 6.1–8.1)

## 2018-01-11 LAB — HEMOGLOBIN A1C
HEMOGLOBIN A1C: 5.2 %{Hb} (ref <5.7)
MEAN PLASMA GLUCOSE: 103 (calc)
eAG (mmol/L): 5.7 (calc)

## 2018-01-15 LAB — HIV-1 RNA ULTRAQUANT REFLEX TO GENTYP+
HIV 1 RNA Quant: <20 copies/mL
HIV-1 RNA QUANT, LOG: DETECTED {Log_copies}/mL

## 2018-02-25 ENCOUNTER — Ambulatory Visit (INDEPENDENT_AMBULATORY_CARE_PROVIDER_SITE_OTHER): Payer: BC Managed Care – PPO

## 2018-02-25 ENCOUNTER — Ambulatory Visit: Payer: BC Managed Care – PPO

## 2018-02-25 VITALS — Wt 298.4 lb

## 2018-02-25 DIAGNOSIS — B20 Human immunodeficiency virus [HIV] disease: Secondary | ICD-10-CM | POA: Diagnosis not present

## 2018-02-25 DIAGNOSIS — Z23 Encounter for immunization: Secondary | ICD-10-CM | POA: Diagnosis not present

## 2018-02-25 NOTE — Progress Notes (Signed)
Received 2of 2 Hep A vaccination. Tolerated well. No question or concerns.

## 2018-03-02 ENCOUNTER — Other Ambulatory Visit: Payer: Self-pay

## 2018-03-02 ENCOUNTER — Emergency Department (HOSPITAL_COMMUNITY)
Admission: EM | Admit: 2018-03-02 | Discharge: 2018-03-03 | Disposition: A | Discharge status: Home / Self Care | Payer: BC Managed Care – PPO | Attending: Emergency Medicine | Admitting: Emergency Medicine

## 2018-03-02 DIAGNOSIS — J069 Acute upper respiratory infection, unspecified: Secondary | ICD-10-CM | POA: Diagnosis not present

## 2018-03-02 DIAGNOSIS — Z79899 Other long term (current) drug therapy: Secondary | ICD-10-CM | POA: Diagnosis not present

## 2018-03-02 DIAGNOSIS — I10 Essential (primary) hypertension: Secondary | ICD-10-CM | POA: Insufficient documentation

## 2018-03-02 DIAGNOSIS — B9789 Other viral agents as the cause of diseases classified elsewhere: Secondary | ICD-10-CM

## 2018-03-02 DIAGNOSIS — J988 Other specified respiratory disorders: Secondary | ICD-10-CM

## 2018-03-02 DIAGNOSIS — R0602 Shortness of breath: Secondary | ICD-10-CM | POA: Diagnosis present

## 2018-03-02 MED ORDER — ACETAMINOPHEN 325 MG PO TABS
650.0000 mg | ORAL_TABLET | Freq: Once | ORAL | Status: AC | PRN
  Administered 2018-03-02: 650 mg via ORAL
  Filled 2018-03-02: qty 2

## 2018-03-02 NOTE — ED Triage Notes (Signed)
Patient c/o CP, SOB, and hip/butt aches since yesterday accompanied by chills.

## 2018-03-03 ENCOUNTER — Emergency Department (HOSPITAL_COMMUNITY): Payer: BC Managed Care – PPO

## 2018-03-03 LAB — BASIC METABOLIC PANEL
Anion gap: 9 (ref 5–15)
BUN: 8 mg/dL (ref 6–20)
CALCIUM: 9.3 mg/dL (ref 8.9–10.3)
CO2: 23 mmol/L (ref 22–32)
Chloride: 105 mmol/L (ref 98–111)
Creatinine, Ser: 1.07 mg/dL — ABNORMAL HIGH (ref 0.44–1.00)
GFR calc non Af Amer: >60 mL/min (ref >60)
GLUCOSE: 124 mg/dL — AB (ref 70–99)
Potassium: 3.6 mmol/L (ref 3.5–5.1)
Sodium: 137 mmol/L (ref 135–145)

## 2018-03-03 LAB — I-STAT TROPONIN, ED: TROPONIN I, POC: 0 ng/mL (ref 0.00–0.08)

## 2018-03-03 LAB — CBC
HCT: 42.6 % (ref 36.0–46.0)
Hemoglobin: 13.5 g/dL (ref 12.0–15.0)
MCH: 29.2 pg (ref 26.0–34.0)
MCHC: 31.7 g/dL (ref 30.0–36.0)
MCV: 92 fL (ref 78.0–100.0)
PLATELETS: 271 10*3/uL (ref 150–400)
RBC: 4.63 MIL/uL (ref 3.87–5.11)
RDW: 12.8 % (ref 11.5–15.5)
WBC: 5.9 10*3/uL (ref 4.0–10.5)

## 2018-03-03 NOTE — ED Provider Notes (Signed)
[facility] EMERGENCY DEPARTMENT Provider Note  CSN: [id number] Arrival date & time: 03/02/18 2331  Chief Complaint(s) Shortness of Breath  HPI Brenda Tyler is a 32 y.o. female with H/O HIV on Syntuza with recent CD4 330, quant not detectable in May here with 2 days of fever, chills, frontal headache, sinus pain, nasal congestion, sore throat and cough.  Patient reports sick contacts at home stating that her youngest son has had URI symptoms.  Patient has been taking NyQuil with minimal relief.  Denies any nausea or vomiting.  Still taking oral hydration.  Denies any chest pain.  Endorses shortness of breath with coughing.  No abdominal pain.  No urinary symptoms.  No diarrhea.   HPI  Past Medical History Past Medical History:  Diagnosis Date  . HIV (human immunodeficiency virus infection) (HCC)   . HSV (herpes simplex virus) infection   . Hypertension   . Obesity    Patient Active Problem List   Diagnosis Date Noted  . Dislocation of knee, anterior, left, closed 01/21/2016  . Closed anterior dislocation of left knee 01/21/2016  . Depression 07/26/2014  . Oropharyngeal candidiasis 07/05/2014  . 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 Nausea without vomiting 07/02/2014  . UTI (lower urinary tract infection) 07/02/2014  . HIV disease (HCC) 04/23/2014  . General counseling for prescription of oral contraceptives 06/11/2013  . 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 Obesity 12/22/2012  . HGSIL on Pap smear of cervix 12/22/2012   Home Medication(s) Prior to Admission medications   Medication Sig Start Date End Date Taking? Authorizing Provider  Darunavir-Cobicisctat-Emtricitabine-Tenofovir Alafenamide (SYMTUZA) 800-150-200-10 MG TABS Take 1 tablet by mouth daily with breakfast. 11/06/17  Yes Kuppelweiser, Cassie L, RPH-CPP                                                                                                                                    Past Surgical  History Past Surgical History:  Procedure Laterality Date  . INCISION AND DRAINAGE ABSCESS     on abdomen and buttocks   Family History Family History  Problem Relation Age of Onset  . Heart disease Mother   . Hypertension Maternal Grandmother   . Diabetes Maternal Grandmother   . Heart disease Maternal Grandmother   . Cancer Maternal Grandmother   . Hypertension Paternal Grandmother   . Diabetes Paternal Grandmother   . Heart disease Paternal Grandmother     Social History Social History   Tobacco Use  . Smoking status: Never Smoker  . Smokeless tobacco: Never Used  Substance Use Topics  . Alcohol use: Yes    Alcohol/week: 2.0 standard drinks    Types: 2 Standard drinks or equivalent per week    Comment: occasional  . Drug use: No    Frequency: 2.0 times per week    Types: Marijuana    Comment: pt states no drugs   Allergies Dilaudid [hydromorphone hcl]  Review  of Systems Review of Systems All other systems are reviewed and are negative for acute change except as noted in the HPI  Physical Exam Vital Signs  I have reviewed the triage vital signs BP 126/74 (BP Location: Right Arm)   Pulse (!) 108   Temp (!) 100.4 F (38 C) (Oral)   Resp 16   Ht 5' 5.5" (1.664 m)   Wt 133.8 kg   SpO2 97%   BMI 48.34 kg/m   Physical Exam  Constitutional: She is oriented to person, place, and time. She appears well-developed and well-nourished. No distress.  HENT:  Head: Normocephalic and atraumatic.  Nose: Mucosal edema and rhinorrhea present. Right sinus exhibits maxillary sinus tenderness and frontal sinus tenderness. Left sinus exhibits maxillary sinus tenderness and frontal sinus tenderness.  Mouth/Throat: No oropharyngeal exudate, posterior oropharyngeal edema, posterior oropharyngeal erythema or tonsillar abscesses. No tonsillar exudate.  Post nasal drip   Eyes: Pupils are equal, round, and reactive to light. Conjunctivae and EOM are normal. Right eye exhibits no  discharge. Left eye exhibits no discharge. No scleral icterus.  Neck: Normal range of motion. Neck supple.  Cardiovascular: Normal rate and regular rhythm. Exam reveals no gallop and no friction rub.  No murmur heard. Pulmonary/Chest: Effort normal and breath sounds normal. No stridor. No respiratory distress. She has no rales.  Abdominal: Soft. She exhibits no distension. There is no tenderness.  Musculoskeletal: She exhibits no edema or tenderness.  Neurological: She is alert and oriented to person, place, and time.  Skin: Skin is warm and dry. No rash noted. She is not diaphoretic. No erythema.  Psychiatric: She has a normal mood and affect.  Vitals reviewed.   ED Results and Treatments Labs (all labs ordered are listed, but only abnormal results are displayed) Labs Reviewed  BASIC METABOLIC PANEL - Abnormal; Notable for the following components:      Result Value   Glucose, Bld 124 (*)    Creatinine, Ser 1.07 (*)    All other components within normal limits  CBC  I-STAT TROPONIN, ED                                                                                                                         EKG  EKG Interpretation  Date/Time:  Sunday March 02 2018 23:55:09 EDT Ventricular Rate:  106 PR Interval:  154 QRS Duration: 82 QT Interval:  306 QTC Calculation: 406 R Axis:   35 Text Interpretation:  Sinus tachycardia Possible Anterior infarct , age undetermined Abnormal ECG NO STEMI Confirmed by Drema Pry [phone]) on 03/03/2018 12:54:33 AM      Radiology Dg Chest 2 View  Result Date: 03/03/2018 CLINICAL DATA:  Shortness of breath.  Chest pain and fever. EXAM: CHEST - 2 VIEW COMPARISON:  Radiograph 07/29/2016 FINDINGS: Low lung volumes.The cardiomediastinal contours are normal. Pulmonary vasculature is normal. No consolidation, pleural effusion, or pneumothorax. No acute osseous abnormalities are seen. EKG leads project over the chest. IMPRESSION: Low  lung volumes  without acute chest finding. Electronically Signed   By: Narda Rutherford M.D.   On: 03/03/2018 00:31   Pertinent labs & imaging results that were available during my care of the patient were reviewed by me and considered in my medical decision making (see chart for details).  Medications Ordered in ED Medications  acetaminophen (TYLENOL) tablet 650 mg (650 mg Oral Given 03/02/18 2353)                                                                                                                                    Procedures Procedures  (including critical care time)  Medical Decision Making / ED Course I have reviewed the nursing notes for this encounter and the patient's prior records (if available in EHR or on provided paperwork).    32 y.o. female presents with URI symptoms for 2 days.  History of HIV on antiretroviral medication with undetectable quant and CD4 greater than 300 in May.  Positive sick contacts.  Adequate oral hydration. Rest of history as above.  Patient appears sick. No signs of toxicity, patient is interactive and playful. No hypoxia, tachypnea or other signs of respiratory distress. No sign of clinical dehydration. Lung exam clear. Rest of exam as above.  No evidence suggestive of pharyngitis, AOM, PNA, or meningitis.  Labs without leukocytosis or anemia.  No significant electrolyte derangements. mild renal insufficiency.  Chest x-ray negative for pneumonia.  Most consistent with viral upper respiratory infection.   Discussed symptomatic treatment with the patient and they will follow closely with their PCP.    Final Clinical Impression(s) / ED Diagnoses Final diagnoses:  Viral respiratory illness   Disposition: Discharge  Condition: Good  I have discussed the results, Dx and Tx plan with the patient who expressed understanding and agree(s) with the plan. Discharge instructions discussed at great length. The patient was given strict return precautions who  verbalized understanding of the instructions. No further questions at time of discharge.    ED Discharge Orders    None       Follow Up: Primary care provider  Schedule an appointment as soon as possible for a visit  in 5-7 days, If symptoms do not improve or  worsen      This chart was dictated using voice recognition software.  Despite best efforts to proofread,  errors can occur which can change the documentation meaning.   Nira Conn, MD 03/03/18 [phone]

## 2018-03-03 NOTE — Discharge Instructions (Addendum)
You may take over-the-counter medicine for symptomatic relief, such as Tylenol, Motrin, TheraFlu, Alka seltzer , black elderberry, etc. Please limit acetaminophen (Tylenol) to 4000 mg and Ibuprofen (Motrin, Advil, etc.) to 2400 mg for a 24hr period. Please note that other over-the-counter medicine may contain acetaminophen or ibuprofen as a component of their ingredients.   

## 2018-04-23 ENCOUNTER — Ambulatory Visit: Payer: BC Managed Care – PPO | Admitting: Obstetrics & Gynecology

## 2018-04-23 ENCOUNTER — Encounter: Payer: Self-pay | Admitting: *Deleted

## 2018-04-23 NOTE — Progress Notes (Signed)
Brenda Tyler did not keep her scheduled appointment for annual  Per discussion with Dr.Anyanwu does not need to be called, may reschedule if she calls.

## 2018-05-19 ENCOUNTER — Encounter: Payer: Self-pay | Admitting: Obstetrics & Gynecology

## 2018-05-19 ENCOUNTER — Telehealth: Payer: Self-pay | Admitting: *Deleted

## 2018-05-19 ENCOUNTER — Ambulatory Visit (INDEPENDENT_AMBULATORY_CARE_PROVIDER_SITE_OTHER): Payer: BC Managed Care – PPO | Admitting: Obstetrics & Gynecology

## 2018-05-19 ENCOUNTER — Other Ambulatory Visit (HOSPITAL_COMMUNITY)
Admission: RE | Admit: 2018-05-19 | Discharge: 2018-05-19 | Disposition: A | Discharge status: Home / Self Care | Payer: BC Managed Care – PPO | Source: Ambulatory Visit | Attending: Obstetrics & Gynecology | Admitting: Obstetrics & Gynecology

## 2018-05-19 VITALS — BP 120/84 | HR 66 | Wt 303.5 lb

## 2018-05-19 DIAGNOSIS — Z01419 Encounter for gynecological examination (general) (routine) without abnormal findings: Secondary | ICD-10-CM | POA: Diagnosis present

## 2018-05-19 DIAGNOSIS — N644 Mastodynia: Secondary | ICD-10-CM | POA: Diagnosis not present

## 2018-05-19 DIAGNOSIS — Z87410 Personal history of cervical dysplasia: Secondary | ICD-10-CM | POA: Diagnosis not present

## 2018-05-19 DIAGNOSIS — N760 Acute vaginitis: Secondary | ICD-10-CM | POA: Diagnosis not present

## 2018-05-19 DIAGNOSIS — Z0000 Encounter for general adult medical examination without abnormal findings: Secondary | ICD-10-CM | POA: Diagnosis not present

## 2018-05-19 DIAGNOSIS — B9689 Other specified bacterial agents as the cause of diseases classified elsewhere: Secondary | ICD-10-CM

## 2018-05-19 NOTE — Progress Notes (Signed)
GYNECOLOGY ANNUAL PREVENTATIVE CARE ENCOUNTER NOTE  Subjective:   Brenda Tyler is a 32 y.o. [phone] female here for a routine annual gynecologic exam.  Current complaints: none.   Denies abnormal vaginal bleeding, discharge, pelvic pain, problems with intercourse or other gynecologic concerns.    Gynecologic History No LMP recorded. (Menstrual status: Other). 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 Contraception: none Last Pap: 01/2017. Results were: normal with positive HPV    Obstetric History OB History  Gravida Para Term Preterm AB Living  4 3 3   1 3   SAB TAB Ectopic Multiple Live Births    1          # Outcome Date GA Lbr Len/2nd Weight Sex Delivery Anes PTL Lv  4 TAB           3 Term           2 Term           1 Term             Past Medical History:  Diagnosis Date  . HIV (human immunodeficiency virus infection) (HCC)   . HSV (herpes simplex virus) infection   . Hypertension   . Obesity     Past Surgical History:  Procedure Laterality Date  . INCISION AND DRAINAGE ABSCESS     on abdomen and buttocks    Current Outpatient Medications on File Prior to Visit  Medication Sig Dispense Refill  . Darunavir-Cobicisctat-Emtricitabine-Tenofovir Alafenamide (SYMTUZA) 800-150-200-10 MG TABS Take 1 tablet by mouth daily with breakfast. 30 tablet 5   No current facility-administered medications on file prior to visit.     Allergies  Allergen Reactions  . Dilaudid [Hydromorphone Hcl] Other (See Comments)    Makes her dizzy    Social History:  reports that she has never smoked. She has never used smokeless tobacco. She reports that she drinks about 2.0 standard drinks of alcohol per week. She reports that she does not use drugs.  Family History  Problem Relation Age of Onset  . Heart disease Mother   . Hypertension Maternal Grandmother   . Diabetes Maternal Grandmother   . Heart disease Maternal Grandmother   .  Cancer Maternal Grandmother   . Hypertension Paternal Grandmother   . Diabetes Paternal Grandmother   . Heart disease Paternal Grandmother     The following portions of the patient's history were reviewed and updated as appropriate: allergies, current medications, past family history, past medical history, past social history, past surgical history and problem list.  Review of Systems Pertinent items noted in HPI and remainder of comprehensive ROS otherwise negative.   Objective:  BP 120/84   Pulse 66   Wt (!) 303 lb 8 oz (137.7 kg)   BMI 49.74 kg/m  CONSTITUTIONAL: Well-developed, well-nourished female in no acute distress.  HENT:  Normocephalic, atraumatic, External right and left ear normal. Oropharynx is clear and moist EYES: Conjunctivae and EOM are normal. Pupils are equal, round, and reactive to light. No scleral icterus.  NECK: Normal range of motion, supple, no masses.  Normal thyroid.  SKIN: Skin is warm and dry. No rash noted. Not diaphoretic. No erythema. No pallor. NEUROLOGIC: Alert and oriented to person, place, and time. Normal reflexes, muscle tone coordination. No cranial nerve deficit noted. PSYCHIATRIC: Normal mood and affect. Normal behavior. Normal judgment and thought content. CARDIOVASCULAR: Normal heart rate noted, regular rhythm RESPIRATORY: Clear to auscultation bilaterally. Effort and breath sounds  normal, no problems with respiration noted. BREASTS: Symmetric in size. Breast pain on palpation in upper inner quadrant of both breasts. No masses, skin changes, nipple drainage, or lymphadenopathy. ABDOMEN: Soft, obese, normal bowel sounds, no distention appreciated.  No tenderness, rebound or guarding.  PELVIC: Normal appearing external genitalia; normal appearing vaginal mucosa and cervix.  Normal appearing discharge.  Pap smear obtained.  Unable to palpate uterus or adnexa secondary to habitus.  MUSCULOSKELETAL: Normal range of motion. No tenderness.  No  cyanosis, clubbing, or edema.  2+ distal pulses.   Assessment and Plan:  1. Pain of both breasts Patient reports chronic breast pain in upper inner quadrant of both breasts for several months, not related to anything, not alleviated by anything. Unsure of etiology of bilateral breast pain.  Imaging studies ordered. - MM DIAG BREAST TOMO BILATERAL; Future - US BREAST LTD UNI LEFT INC AXILLA; Future - US BREAST LTD UNI RIGHT INC AXILLA; Future  2. History of cervical dysplasia 3. Encounter for gynecological examination with Papanicolaou smear of cervix - Cytology - PAP Will follow up results of pap smear and manage accordingly. Routine preventative health maintenance measures emphasized. Please refer to After Visit Summary for other counseling recommendations.    Jaynie Collins, MD, FACOG Obstetrician & Gynecologist, [facility] for [employer], [facility]

## 2018-05-19 NOTE — Patient Instructions (Signed)
Preventive Care 18-39 Years, Female Preventive care refers to lifestyle choices and visits with your health care provider that can promote health and wellness. What does preventive care include?  A yearly physical exam. This is also called an annual well check.  Dental exams once or twice a year.  Routine eye exams. Ask your health care provider how often you should have your eyes checked.  Personal lifestyle choices, including: ? Daily care of your teeth and gums. ? Regular physical activity. ? Eating a healthy diet. ? Avoiding tobacco and drug use. ? Limiting alcohol use. ? Practicing safe sex. ? Taking vitamin and mineral supplements as recommended by your health care provider. What happens during an annual well check? The services and screenings done by your health care provider during your annual well check will depend on your age, overall health, lifestyle risk factors, and family history of disease. Counseling Your health care provider may ask you questions about your:  Alcohol use.  Tobacco use.  Drug use.  Emotional well-being.  Home and relationship well-being.  Sexual activity.  Eating habits.  Work and work Statistician.  Method of birth control.  Menstrual cycle.  Pregnancy history.  Screening You may have the following tests or measurements:  Height, weight, and BMI.  Diabetes screening. This is done by checking your blood sugar (glucose) after you have not eaten for a while (fasting).  Blood pressure.  Lipid and cholesterol levels. These may be checked every 5 years starting at age 66.  Skin check.  Hepatitis C blood test.  Hepatitis B blood test.  Sexually transmitted disease (STD) testing.  BRCA-related cancer screening. This may be done if you have a family history of breast, ovarian, tubal, or peritoneal cancers.  Pelvic exam and Pap test. This may be done every 3 years starting at age 40. Starting at age 59, this may be done every 5  years if you have a Pap test in combination with an HPV test.  Discuss your test results, treatment options, and if necessary, the need for more tests with your health care provider. Vaccines Your health care provider may recommend certain vaccines, such as:  Influenza vaccine. This is recommended every year.  Tetanus, diphtheria, and acellular pertussis (Tdap, Td) vaccine. You may need a Td booster every 10 years.  Varicella vaccine. You may need this if you have not been vaccinated.  HPV vaccine. If you are 69 or younger, you may need three doses over 6 months.  Measles, mumps, and rubella (MMR) vaccine. You may need at least one dose of MMR. You may also need a second dose.  Pneumococcal 13-valent conjugate (PCV13) vaccine. You may need this if you have certain conditions and were not previously vaccinated.  Pneumococcal polysaccharide (PPSV23) vaccine. You may need one or two doses if you smoke cigarettes or if you have certain conditions.  Meningococcal vaccine. One dose is recommended if you are age 27-21 years and a first-year college student living in a residence hall, or if you have one of several medical conditions. You may also need additional booster doses.  Hepatitis A vaccine. You may need this if you have certain conditions or if you travel or work in places where you may be exposed to hepatitis A.  Hepatitis B vaccine. You may need this if you have certain conditions or if you travel or work in places where you may be exposed to hepatitis B.  Haemophilus influenzae type b (Hib) vaccine. You may need this if  you have certain risk factors.  Talk to your health care provider about which screenings and vaccines you need and how often you need them. This information is not intended to replace advice given to you by your health care provider. Make sure you discuss any questions you have with your health care provider. Document Released: 08/07/2001 Document Revised: 02/29/2016  Document Reviewed: 04/12/2015 Elsevier Interactive Patient Education  [employer].

## 2018-05-19 NOTE — Telephone Encounter (Signed)
Pt called and informed of appointment for mammogram and breast ultrasound.  Appointment is on 05/26/18 @ 0850.  Pt instructed to arrive at 0830 with photo ID and insurance cards and avoid wearing deodorant, perfumes, or powders. Pt verbalized understanding.

## 2018-05-19 NOTE — Progress Notes (Signed)
Appointment scheduled for diagnostic mammogram and breast ultrasounds for 05/26/18 @ 0850.   Pt instructed to arrive at 0830 with photo ID and insurance cards.  Pt instructed not to wear deodorant, perfumes or powders.

## 2018-05-21 LAB — CYTOLOGY - PAP
ADEQUACY: ABSENT
Bacterial vaginitis: POSITIVE — AB
CHLAMYDIA, DNA PROBE: NEGATIVE
Candida vaginitis: NEGATIVE
Diagnosis: NEGATIVE
HPV: NOT DETECTED
Neisseria Gonorrhea: NEGATIVE
TRICH (WINDOWPATH): NEGATIVE

## 2018-05-21 MED ORDER — METRONIDAZOLE 500 MG PO TABS: 500.0000 mg | ORAL_TABLET | Freq: Two times a day (BID) | ORAL | 0 refills | Status: DC

## 2018-05-21 NOTE — Addendum Note (Signed)
Addended by: Jaynie CollinsANYANWU, Ibrahima Holberg A on: 05/21/2018 04:14 PM   Modules accepted: Orders

## 2018-05-26 ENCOUNTER — Other Ambulatory Visit: Payer: BC Managed Care – PPO

## 2018-06-30 ENCOUNTER — Other Ambulatory Visit (HOSPITAL_COMMUNITY)
Admission: RE | Admit: 2018-06-30 | Discharge: 2018-06-30 | Disposition: A | Discharge status: Home / Self Care | Payer: BC Managed Care – PPO | Source: Ambulatory Visit | Attending: Infectious Diseases | Admitting: Infectious Diseases

## 2018-06-30 ENCOUNTER — Other Ambulatory Visit: Payer: BC Managed Care – PPO

## 2018-06-30 DIAGNOSIS — Z79899 Other long term (current) drug therapy: Secondary | ICD-10-CM

## 2018-06-30 DIAGNOSIS — Z113 Encounter for screening for infections with a predominantly sexual mode of transmission: Secondary | ICD-10-CM | POA: Diagnosis not present

## 2018-06-30 DIAGNOSIS — B20 Human immunodeficiency virus [HIV] disease: Secondary | ICD-10-CM | POA: Diagnosis not present

## 2018-07-01 LAB — URINE CYTOLOGY ANCILLARY ONLY
Chlamydia: NEGATIVE
NEISSERIA GONORRHEA: NEGATIVE

## 2018-07-01 LAB — T-HELPER CELL (CD4) - (RCID CLINIC ONLY)
CD4 T CELL ABS: 400 /uL (ref 400–2700)
CD4 T CELL HELPER: 26 % — AB (ref 33–55)

## 2018-07-02 LAB — CBC
HEMATOCRIT: 36.1 % (ref 35.0–45.0)
Hemoglobin: 12.2 g/dL (ref 11.7–15.5)
MCH: 29.6 pg (ref 27.0–33.0)
MCHC: 33.8 g/dL (ref 32.0–36.0)
MCV: 87.6 fL (ref 80.0–100.0)
MPV: 11.1 fL (ref 7.5–12.5)
Platelets: 284 10*3/uL (ref 140–400)
RBC: 4.12 10*6/uL (ref 3.80–5.10)
RDW: 13.1 % (ref 11.0–15.0)
WBC: 4.3 10*3/uL (ref 3.8–10.8)

## 2018-07-02 LAB — RPR: RPR: NONREACTIVE

## 2018-07-02 LAB — COMPREHENSIVE METABOLIC PANEL
AG Ratio: 1.2 (calc) (ref 1.0–2.5)
ALKALINE PHOSPHATASE (APISO): 104 U/L (ref 33–115)
ALT: 9 U/L (ref 6–29)
AST: 11 U/L (ref 10–30)
Albumin: 3.8 g/dL (ref 3.6–5.1)
BUN: 12 mg/dL (ref 7–25)
CHLORIDE: 105 mmol/L (ref 98–110)
CO2: 30 mmol/L (ref 20–32)
Calcium: 9 mg/dL (ref 8.6–10.2)
Creat: 0.74 mg/dL (ref 0.50–1.10)
GLOBULIN: 3.3 g/dL (ref 1.9–3.7)
GLUCOSE: 85 mg/dL (ref 65–99)
Potassium: 3.9 mmol/L (ref 3.5–5.3)
Sodium: 140 mmol/L (ref 135–146)
Total Bilirubin: 0.3 mg/dL (ref 0.2–1.2)
Total Protein: 7.1 g/dL (ref 6.1–8.1)

## 2018-07-02 LAB — LIPID PANEL
Cholesterol: 167 mg/dL (ref <200)
HDL: 54 mg/dL (ref >50)
LDL Cholesterol (Calc): 97 mg/dL (calc)
Non-HDL Cholesterol (Calc): 113 mg/dL (calc) (ref <130)
TRIGLYCERIDES: 75 mg/dL (ref <150)
Total CHOL/HDL Ratio: 3.1 (calc) (ref <5.0)

## 2018-07-02 LAB — HIV-1 RNA QUANT-NO REFLEX-BLD
HIV 1 RNA QUANT: 79 {copies}/mL — AB
HIV-1 RNA Quant, Log: 1.9 Log copies/mL — ABNORMAL HIGH

## 2018-07-14 ENCOUNTER — Encounter: Payer: BC Managed Care – PPO | Admitting: Infectious Diseases

## 2018-07-15 ENCOUNTER — Ambulatory Visit (INDEPENDENT_AMBULATORY_CARE_PROVIDER_SITE_OTHER): Payer: BC Managed Care – PPO | Admitting: Infectious Diseases

## 2018-07-15 ENCOUNTER — Encounter: Payer: Self-pay | Admitting: Infectious Diseases

## 2018-07-15 VITALS — BP 114/77 | HR 98 | Temp 97.9°F | Wt 299.0 lb

## 2018-07-15 DIAGNOSIS — F329 Major depressive disorder, single episode, unspecified: Secondary | ICD-10-CM

## 2018-07-15 DIAGNOSIS — Z113 Encounter for screening for infections with a predominantly sexual mode of transmission: Secondary | ICD-10-CM

## 2018-07-15 DIAGNOSIS — B20 Human immunodeficiency virus [HIV] disease: Secondary | ICD-10-CM

## 2018-07-15 DIAGNOSIS — Z79899 Other long term (current) drug therapy: Secondary | ICD-10-CM

## 2018-07-15 DIAGNOSIS — R87613 High grade squamous intraepithelial lesion on cytologic smear of cervix (HGSIL): Secondary | ICD-10-CM | POA: Diagnosis not present

## 2018-07-15 DIAGNOSIS — F32A Depression, unspecified: Secondary | ICD-10-CM

## 2018-07-15 DIAGNOSIS — Z6841 Body Mass Index (BMI) 40.0 and over, adult: Secondary | ICD-10-CM

## 2018-07-15 NOTE — Progress Notes (Signed)
   Subjective:    Patient ID: Brenda Tyler, female    DOB: 26-Aug-1985, 33 y.o.   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 Previously started on bactrim and stribild ---> symtuza. She has had periods of intermittent adherence. She denies recent problems but has missed 2 doses (when she has to work over her shift). Does not take at same time everyday.  At prev visit was c/o breast pain. She was set up to be seen at breast center but did not f/u. She still has discomfort intermittently.  Her last PAP was 04-2018 (normal).   HIV 1 RNA Quant (copies/mL)  Date Value  06/30/2018 79 (H)  01/10/2018 <20 DETECTED  10/31/2017 69 (H)   CD4 T Cell Abs (/uL)  Date Value  06/30/2018 400  10/31/2017 330 (L)  10/03/2017 340 (L)    Review of Systems  Constitutional: Negative for appetite change and unexpected weight change.  Gastrointestinal: Negative for blood in stool, constipation and diarrhea.  Genitourinary: Negative for difficulty urinating.  Psychiatric/Behavioral: Positive for dysphoric mood and sleep disturbance. The patient is nervous/anxious.   would like to lose wt but needs a plan.  Hard to get to sleep. Has occas days where she feels poorly, does not want to get out of bed, does not want to go to work. Has limited support.     Please see HPI. All other systems reviewed and negative.  Objective:   Physical Exam Constitutional:      Appearance: Normal appearance. She is obese.  HENT:     Mouth/Throat:     Mouth: Mucous membranes are moist.     Pharynx: No oropharyngeal exudate.  Eyes:     Extraocular Movements: Extraocular movements intact.     Pupils: Pupils are equal, round, and reactive to light.  Cardiovascular:     Rate and Rhythm: Normal rate and regular rhythm.  Pulmonary:     Effort: Pulmonary effort is normal.     Breath sounds: Normal breath sounds.  Abdominal:     General: Bowel sounds are normal.     Palpations:  Abdomen is soft.  Musculoskeletal:        General: No swelling.  Neurological:     Mental Status: She is alert.  Psychiatric:        Mood and Affect: Mood is anxious and depressed.       Assessment & Plan:

## 2018-07-15 NOTE — Assessment & Plan Note (Signed)
Encouraged her to f/u with counselor here. She defers.

## 2018-07-15 NOTE — Assessment & Plan Note (Addendum)
Encouraged adherence.  She is otherwise doing well.  Has gotten PCV.  Defers flu shot.  Offered/refused condoms.  Asked her to f/u with breast center about her prev appt and mammo Dental referral placed today for [facility]. Information to schedule appointment completed today.

## 2018-07-15 NOTE — Assessment & Plan Note (Signed)
Last PAP normal (2091) Appreciate PCP/GYN f/u.

## 2018-07-15 NOTE — Assessment & Plan Note (Signed)
She is going to start exercising.  Is going to drink more water and eat less.

## 2018-07-29 ENCOUNTER — Emergency Department (HOSPITAL_COMMUNITY)
Admission: EM | Admit: 2018-07-29 | Discharge: 2018-07-29 | Disposition: A | Discharge status: Home / Self Care | Payer: BC Managed Care – PPO | Attending: Emergency Medicine | Admitting: Emergency Medicine

## 2018-07-29 ENCOUNTER — Encounter (HOSPITAL_COMMUNITY): Payer: Self-pay

## 2018-07-29 DIAGNOSIS — J029 Acute pharyngitis, unspecified: Secondary | ICD-10-CM | POA: Insufficient documentation

## 2018-07-29 DIAGNOSIS — Z5321 Procedure and treatment not carried out due to patient leaving prior to being seen by health care provider: Secondary | ICD-10-CM | POA: Insufficient documentation

## 2018-07-29 LAB — GROUP A STREP BY PCR: GROUP A STREP BY PCR: NOT DETECTED

## 2018-07-29 MED ORDER — ACETAMINOPHEN 325 MG PO TABS
650.0000 mg | ORAL_TABLET | Freq: Once | ORAL | Status: AC | PRN
  Administered 2018-07-29: 650 mg via ORAL
  Filled 2018-07-29: qty 2

## 2018-07-29 NOTE — ED Notes (Signed)
Called for patient 2 times, No answer.

## 2018-07-29 NOTE — ED Notes (Signed)
Patient was called 3rd time, No answer.

## 2018-07-29 NOTE — ED Triage Notes (Signed)
Pt here for generalized body aches, fever, chills, and sore throat since last night.  Fever of 102.1 in triage.  Treated with tylenol.  A&Ox4, ambulatory to triage.

## 2018-07-30 ENCOUNTER — Emergency Department (HOSPITAL_COMMUNITY): Payer: Self-pay

## 2018-07-30 ENCOUNTER — Encounter (HOSPITAL_COMMUNITY): Payer: Self-pay | Admitting: Emergency Medicine

## 2018-07-30 ENCOUNTER — Emergency Department (HOSPITAL_COMMUNITY)
Admission: EM | Admit: 2018-07-30 | Discharge: 2018-07-30 | Disposition: A | Discharge status: Home / Self Care | Payer: Self-pay | Attending: Emergency Medicine | Admitting: Emergency Medicine

## 2018-07-30 DIAGNOSIS — I10 Essential (primary) hypertension: Secondary | ICD-10-CM | POA: Insufficient documentation

## 2018-07-30 DIAGNOSIS — Z79899 Other long term (current) drug therapy: Secondary | ICD-10-CM | POA: Insufficient documentation

## 2018-07-30 DIAGNOSIS — J101 Influenza due to other identified influenza virus with other respiratory manifestations: Secondary | ICD-10-CM | POA: Insufficient documentation

## 2018-07-30 DIAGNOSIS — B20 Human immunodeficiency virus [HIV] disease: Secondary | ICD-10-CM | POA: Insufficient documentation

## 2018-07-30 LAB — CBC WITH DIFFERENTIAL/PLATELET
ABS IMMATURE GRANULOCYTES: 0.01 10*3/uL (ref 0.00–0.07)
Basophils Absolute: 0 10*3/uL (ref 0.0–0.1)
Basophils Relative: 0 %
EOS PCT: 0 %
Eosinophils Absolute: 0 10*3/uL (ref 0.0–0.5)
HCT: 43.7 % (ref 36.0–46.0)
HEMOGLOBIN: 13.8 g/dL (ref 12.0–15.0)
IMMATURE GRANULOCYTES: 0 %
LYMPHS PCT: 13 %
Lymphs Abs: 1 10*3/uL (ref 0.7–4.0)
MCH: 28.8 pg (ref 26.0–34.0)
MCHC: 31.6 g/dL (ref 30.0–36.0)
MCV: 91.2 fL (ref 80.0–100.0)
MONO ABS: 1.1 10*3/uL — AB (ref 0.1–1.0)
MONOS PCT: 14 %
NEUTROS ABS: 5.6 10*3/uL (ref 1.7–7.7)
Neutrophils Relative %: 73 %
PLATELETS: 224 10*3/uL (ref 150–400)
RBC: 4.79 MIL/uL (ref 3.87–5.11)
RDW: 13.3 % (ref 11.5–15.5)
WBC: 7.8 10*3/uL (ref 4.0–10.5)
nRBC: 0 % (ref 0.0–0.2)

## 2018-07-30 LAB — COMPREHENSIVE METABOLIC PANEL
ALT: 18 U/L (ref 0–44)
AST: 20 U/L (ref 15–41)
Albumin: 3.8 g/dL (ref 3.5–5.0)
Alkaline Phosphatase: 98 U/L (ref 38–126)
Anion gap: 10 (ref 5–15)
BILIRUBIN TOTAL: 0.4 mg/dL (ref 0.3–1.2)
BUN: 7 mg/dL (ref 6–20)
CHLORIDE: 104 mmol/L (ref 98–111)
CO2: 23 mmol/L (ref 22–32)
Calcium: 9.1 mg/dL (ref 8.9–10.3)
Creatinine, Ser: 1.01 mg/dL — ABNORMAL HIGH (ref 0.44–1.00)
GFR calc Af Amer: >60 mL/min (ref >60)
GLUCOSE: 131 mg/dL — AB (ref 70–99)
Potassium: 3.5 mmol/L (ref 3.5–5.1)
Sodium: 137 mmol/L (ref 135–145)
TOTAL PROTEIN: 8.8 g/dL — AB (ref 6.5–8.1)

## 2018-07-30 LAB — INFLUENZA PANEL BY PCR (TYPE A & B)
INFLAPCR: POSITIVE — AB
Influenza B By PCR: NEGATIVE

## 2018-07-30 LAB — I-STAT BETA HCG BLOOD, ED (MC, WL, AP ONLY)

## 2018-07-30 LAB — LACTIC ACID, PLASMA
Lactic Acid, Venous: 1.1 mmol/L (ref 0.5–1.9)
Lactic Acid, Venous: 0.7 mmol/L (ref 0.5–1.9)

## 2018-07-30 MED ORDER — SODIUM CHLORIDE 0.9 % IV BOLUS
1000.0000 mL | Freq: Once | INTRAVENOUS | Status: AC
  Administered 2018-07-30: 1000 mL via INTRAVENOUS

## 2018-07-30 MED ORDER — ONDANSETRON 4 MG PO TBDP: 4.0000 mg | ORAL_TABLET | Freq: Three times a day (TID) | ORAL | 0 refills | Status: DC | PRN

## 2018-07-30 MED ORDER — ALBUTEROL SULFATE HFA 108 (90 BASE) MCG/ACT IN AERS: 2.0000 | INHALATION_SPRAY | RESPIRATORY_TRACT | 0 refills | Status: DC | PRN

## 2018-07-30 MED ORDER — BENZONATATE 100 MG PO CAPS: 100.0000 mg | ORAL_CAPSULE | Freq: Three times a day (TID) | ORAL | 0 refills | Status: DC

## 2018-07-30 MED ORDER — OSELTAMIVIR PHOSPHATE 75 MG PO CAPS: 75.0000 mg | ORAL_CAPSULE | Freq: Two times a day (BID) | ORAL | 0 refills | Status: DC

## 2018-07-30 MED ORDER — ACETAMINOPHEN 325 MG PO TABS
650.0000 mg | ORAL_TABLET | Freq: Once | ORAL | Status: DC | PRN
  Filled 2018-07-30 (×2): qty 2

## 2018-07-30 MED ORDER — KETOROLAC TROMETHAMINE 30 MG/ML IJ SOLN
15.0000 mg | Freq: Once | INTRAMUSCULAR | Status: AC
  Administered 2018-07-30: 15 mg via INTRAVENOUS
  Filled 2018-07-30: qty 1

## 2018-07-30 MED ORDER — ONDANSETRON HCL 4 MG/2ML IJ SOLN
4.0000 mg | Freq: Once | INTRAMUSCULAR | Status: AC
  Administered 2018-07-30: 4 mg via INTRAVENOUS
  Filled 2018-07-30: qty 2

## 2018-07-30 NOTE — ED Notes (Signed)
Pt being taken to Xray

## 2018-07-30 NOTE — ED Triage Notes (Signed)
Pt c/o cough, fever, chills, h/a and body aches x2 days, reports she came in yesterday but left before being seen due to the wait time. Hx of hiv.

## 2018-07-30 NOTE — Discharge Instructions (Addendum)
You tested positive for influenza A, which is a virus.  Viruses do not require or respond to antibiotics. Treatment is symptomatic care and it is important to note that these symptoms may last for 7-14 days.   Hand washing: Wash your hands throughout the day, but especially before and after touching the face, using the restroom, sneezing, coughing, or touching surfaces that have been coughed or sneezed upon. Hydration: Symptoms will be intensified and complicated by dehydration. Dehydration can also extend the duration of symptoms. Drink plenty of fluids and get plenty of rest. You should be drinking at least half a liter of water an hour to stay hydrated. Electrolyte drinks (ex. Gatorade, Powerade, Pedialyte) are also encouraged. You should be drinking enough fluids to make your urine light yellow, almost clear. If this is not the case, you are not drinking enough water. Please note that some of the treatments indicated below will not be effective if you are not adequately hydrated. Diet: Please concentrate on hydration, however, you may introduce food slowly.  Start with a clear liquid diet, progressed to a full liquid diet, and then bland solids as you are able. Pain or fever: Ibuprofen, Naproxen, or acetaminophen (generic for Tylenol) for pain or fever.  Antiinflammatory medications: Take 600 mg of ibuprofen every 6 hours or 440 mg (over the counter dose) to 500 mg (prescription dose) of naproxen every 12 hours for the next 3 days. After this time, these medications may be used as needed for pain. Take these medications with food to avoid upset stomach. Choose only one of these medications, do not take them together. Acetaminophen (generic for Tylenol): Should you continue to have additional pain while taking the ibuprofen or naproxen, you may add in acetaminophen as needed. Your daily total maximum amount of acetaminophen from all sources should be limited to 4000mg /day for persons without liver  problems, or 2000mg /day for those with liver problems. Nausea/vomiting: Use the ondansetron (generic for Zofran) for nausea or vomiting.  Diarrhea: May use medications such as loperamide (Imodium) or Bismuth subsalicylate (Pepto-Bismol). Cough: Use the benzonatate (generic for Tessalon) for cough.  Albuterol: May use the albuterol as needed for instances of shortness of breath. Prednisone: Take the prednisone, as directed, in its entirety. Zyrtec or Claritin: May add these medication daily to control underlying symptoms of congestion, sneezing, and other signs of allergies.  These medications are available over-the-counter. Generics: Cetirizine (generic for Zyrtec) and loratadine (generic for Claritin). Congestion: Plain guaifenesin (generic for plain Mucinex) may help relieve congestion. Saline sinus rinses and saline nasal sprays may also help relieve congestion.  Sore throat: Warm liquids or Chloraseptic spray may help soothe a sore throat. Gargle twice a day with a salt water solution made from a half teaspoon of salt in a cup of warm water.  Follow up: Follow up with a primary care provider within the next two weeks should symptoms fail to resolve. Return: Return to the ED for significantly worsening symptoms, shortness of breath, persistent vomiting, large amounts of blood in stool, or any other major concerns.  For prescription assistance, may try using prescription discount sites or apps, such as goodrx.com

## 2018-07-30 NOTE — ED Provider Notes (Addendum)
[facility] EMERGENCY [facility] Provider Note   CSN: [id number] Arrival date & time: 07/30/18  [id number]     History   Chief Complaint Chief Complaint  Patient presents with  . Fever  . Cough    HPI Brenda Tyler is a 33 y.o. female.  HPI   Brenda Tyler is a 33 y.o. female, with a history of HIV, HSV, HTN, and obesity, presenting to the ED with body aches for the last 2 days.  Accompanied by bilateral ear pressure, nonproductive cough, nausea, and fever. She has been taking NyQuil and Tylenol without relief.  Denies chest pain, shortness of breath, vomiting, diarrhea, abdominal pain, urinary symptoms, or any other complaints.  Last CD4 count and HIV quant were performed June 30, 2018 and were noted to be 400 and 79, respectively.  Past Medical History:  Diagnosis Date  . HIV (human immunodeficiency virus infection) (HCC)   . HSV (herpes simplex virus) infection   . Hypertension   . Obesity     Patient Active Problem List   Diagnosis Date Noted  . Dislocation of knee, anterior, left, closed 01/21/2016  . Closed anterior dislocation of left knee 01/21/2016  . Depression 07/26/2014  . Oropharyngeal candidiasis 07/05/2014  . 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 Nausea without vomiting 07/02/2014  . HIV disease (HCC) 04/23/2014  . General counseling for prescription of oral contraceptives 06/11/2013  . 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 Obesity 12/22/2012  . HGSIL on Pap smear of cervix 12/22/2012    Past Surgical History:  Procedure Laterality Date  . INCISION AND DRAINAGE ABSCESS     on abdomen and buttocks     OB History    Gravida  4   Para  3   Term  3   Preterm      AB  1   Living  3     SAB      TAB  1   Ectopic      Multiple      Live Births               Home Medications    Prior to Admission medications   Medication Sig Start Date End Date Taking? Authorizing Provider  albuterol (PROVENTIL HFA;VENTOLIN HFA)  108 (90 Base) MCG/ACT inhaler Inhale 2 puffs into the lungs every 4 (four) hours as needed for wheezing or shortness of breath. 07/30/18   ,  C, PA-C  benzonatate (TESSALON) 100 MG capsule Take 1 capsule (100 mg total) by mouth every 8 (eight) hours. 07/30/18   , Hillard Danker C, PA-C  Darunavir-Cobicisctat-Emtricitabine-Tenofovir Alafenamide (SYMTUZA) 800-150-200-10 MG TABS Take 1 tablet by mouth daily with breakfast. 11/06/17   Kuppelweiser, Cassie L, RPH-CPP  ondansetron (ZOFRAN ODT) 4 MG disintegrating tablet Take 1 tablet (4 mg total) by mouth every 8 (eight) hours as needed for nausea or vomiting. 07/30/18   ,  C, PA-C  oseltamivir (TAMIFLU) 75 MG capsule Take 1 capsule (75 mg total) by mouth every 12 (twelve) hours. 07/30/18   Anselm Pancoast,  C, PA-C    Family History Family History  Problem Relation Age of Onset  . Heart disease Mother   . Hypertension Maternal Grandmother   . Diabetes Maternal Grandmother   . Heart disease Maternal Grandmother   . Cancer Maternal Grandmother   . Hypertension Paternal Grandmother   . Diabetes Paternal Grandmother   . Heart disease Paternal Grandmother     Social History Social History   Tobacco Use  .  Smoking status: Never Smoker  . Smokeless tobacco: Never Used  Substance Use Topics  . Alcohol use: Yes    Alcohol/week: 2.0 standard drinks    Types: 2 Standard drinks or equivalent per week    Comment: occasional  . Drug use: No    Frequency: 2.0 times per week    Types: Marijuana    Comment: pt states no drugs     Allergies   Dilaudid [hydromorphone hcl]   Review of Systems Review of Systems  Constitutional: Positive for chills and fever. Negative for diaphoresis.  HENT: Positive for congestion and ear pain. Negative for ear discharge, sinus pain, trouble swallowing and voice change.   Respiratory: Positive for cough. Negative for shortness of breath.   Cardiovascular: Negative for chest pain and leg swelling.  Gastrointestinal:  Positive for nausea. Negative for abdominal pain and diarrhea.  Musculoskeletal: Positive for myalgias. Negative for neck pain and neck stiffness.  Skin: Negative for rash.  Neurological: Negative for syncope and weakness.  All other systems reviewed and are negative.    Physical Exam Updated Vital Signs BP 113/68   Pulse (!) 127   Temp (!) 100.4 F (38 C) (Oral)   Resp 20   SpO2 94%   Physical Exam Vitals signs and nursing note reviewed.  Constitutional:      General: She is not in acute distress.    Appearance: She is well-developed. She is not diaphoretic.  HENT:     Head: Normocephalic and atraumatic.     Right Ear: Tympanic membrane, ear canal and external ear normal.     Left Ear: Tympanic membrane, ear canal and external ear normal.     Nose: Mucosal edema and congestion present.     Right Sinus: No maxillary sinus tenderness or frontal sinus tenderness.     Left Sinus: No maxillary sinus tenderness or frontal sinus tenderness.     Mouth/Throat:     Mouth: Mucous membranes are moist.     Pharynx: Oropharynx is clear.  Eyes:     Conjunctiva/sclera: Conjunctivae normal.  Neck:     Musculoskeletal: Neck supple.  Cardiovascular:     Rate and Rhythm: Normal rate and regular rhythm.     Pulses: Normal pulses.     Heart sounds: Normal heart sounds.     Comments: Heart rate at 100 upon my exam. Pulmonary:     Effort: Pulmonary effort is normal. No respiratory distress.     Breath sounds: Normal breath sounds.     Comments: No increased work of breathing.  Speaks in full sentences without difficulty. Abdominal:     Palpations: Abdomen is soft.     Tenderness: There is no abdominal tenderness. There is no guarding.  Musculoskeletal:     Right lower leg: No edema.     Left lower leg: No edema.  Lymphadenopathy:     Cervical: No cervical adenopathy.  Skin:    General: Skin is warm and dry.  Neurological:     Mental Status: She is alert.  Psychiatric:        Mood  and Affect: Mood and affect normal.        Speech: Speech normal.        Behavior: Behavior normal.      ED Treatments / Results  Labs (all labs ordered are listed, but only abnormal results are displayed) Labs Reviewed  COMPREHENSIVE METABOLIC PANEL - Abnormal; Notable for the following components:      Result Value  Glucose, Bld 131 (*)    Creatinine, Ser 1.01 (*)    Total Protein 8.8 (*)    All other components within normal limits  CBC WITH DIFFERENTIAL/PLATELET - Abnormal; Notable for the following components:   Monocytes Absolute 1.1 (*)    All other components within normal limits  INFLUENZA PANEL BY PCR (TYPE A & B) - Abnormal; Notable for the following components:   Influenza A By PCR POSITIVE (*)    All other components within normal limits  CULTURE, BLOOD (ROUTINE X 2)  CULTURE, BLOOD (ROUTINE X 2)  LACTIC ACID, PLASMA  LACTIC ACID, PLASMA  I-STAT BETA HCG BLOOD, ED ([facility], [facility], [facility] ONLY)    EKG None  ED ECG REPORT   Date: 07/30/2018  Rate: 75  Rhythm: normal sinus rhythm  QRS Axis: normal  Intervals: normal  ST/T Wave abnormalities: nonspecific T wave changes  Conduction Disutrbances:none  Narrative Interpretation:   Old EKG Reviewed: unchanged  I have personally reviewed the EKG tracing and agree with the computerized printout as noted.          Radiology Dg Chest 2 View  Result Date: 07/30/2018 CLINICAL DATA:  Cough and fever EXAM: CHEST - 2 VIEW COMPARISON:  03/03/2018 FINDINGS: Normal heart size and mediastinal contours. No acute infiltrate or edema. No effusion or pneumothorax. No acute osseous findings. IMPRESSION: Negative chest. Electronically Signed   By: Marnee Spring M.D.   On: 07/30/2018 11:14    Procedures Procedures (including critical care time)  Medications Ordered in ED Medications  acetaminophen (TYLENOL) tablet 650 mg (has no administration in time range)  sodium chloride 0.9 % bolus 1,000 mL (0 mLs Intravenous Stopped  07/30/18 1206)    Followed by  sodium chloride 0.9 % bolus 1,000 mL (0 mLs Intravenous Stopped 07/30/18 1206)  ketorolac (TORADOL) 30 MG/ML injection 15 mg (15 mg Intravenous Given 07/30/18 1019)  ondansetron (ZOFRAN) injection 4 mg (4 mg Intravenous Given 07/30/18 1020)     Initial Impression / Assessment and Plan / ED Course  I have reviewed the triage vital signs and the nursing notes.  Pertinent labs & imaging results that were available during my care of the patient were reviewed by me and considered in my medical decision making (see chart for details).     Patient presents with symptoms consistent with viral syndrome.  Positive for influenza A.  Initially febrile and tachycardic, but nontoxic-appearing.  No leukocytosis or lactic acidosis.  She did have an episode of hypotension, however, this quickly resolved with IV fluids and did not recur.  Small increase in creatinine, suspect dehydration. Patient stated she felt much better during her ED course.  HIV labs with acceptable range when they were last checked January 2020. The patient was given instructions for home care as well as return precautions. Patient voices understanding of these instructions, accepts the plan, and is comfortable with discharge.  Findings and plan of care discussed with Alvira Monday, MD.   Vitals:   07/30/18 0940 07/30/18 0944 07/30/18 1040 07/30/18 1242  BP: (!) 80/42 90/60 103/69 109/76  Pulse:   90 86  Resp:   16   Temp:    97.8 F (36.6 C)  TempSrc:    Oral  SpO2:   98% 98%     Final Clinical Impressions(s) / ED Diagnoses   Final diagnoses:  Influenza A    ED Discharge Orders         Ordered    oseltamivir (TAMIFLU) 75 MG  capsule  Every 12 hours     07/30/18 0934    benzonatate (TESSALON) 100 MG capsule  Every 8 hours     07/30/18 0934    ondansetron (ZOFRAN ODT) 4 MG disintegrating tablet  Every 8 hours PRN     07/30/18 0934    albuterol (PROVENTIL HFA;VENTOLIN HFA) 108 (90 Base)  MCG/ACT inhaler  Every 4 hours PRN     07/30/18 0934           Anselm Pancoast, PA-C 07/30/18 1419    Anselm Pancoast, PA-C 07/30/18 1419    Alvira Monday, MD 08/01/18 2152

## 2018-08-04 LAB — CULTURE, BLOOD (ROUTINE X 2)
Culture: NO GROWTH
Special Requests: ADEQUATE

## 2018-08-14 ENCOUNTER — Other Ambulatory Visit: Payer: Self-pay

## 2018-08-14 DIAGNOSIS — B20 Human immunodeficiency virus [HIV] disease: Secondary | ICD-10-CM

## 2018-08-14 MED ORDER — DARUN-COBIC-EMTRICIT-TENOFAF 800-150-200-10 MG PO TABS: 1.0000 | ORAL_TABLET | Freq: Every day | ORAL | 5 refills | Status: DC

## 2018-08-15 ENCOUNTER — Ambulatory Visit: Payer: Medicaid Other

## 2018-08-18 ENCOUNTER — Other Ambulatory Visit: Payer: Self-pay

## 2018-08-18 DIAGNOSIS — B20 Human immunodeficiency virus [HIV] disease: Secondary | ICD-10-CM

## 2018-08-18 MED ORDER — DARUN-COBIC-EMTRICIT-TENOFAF 800-150-200-10 MG PO TABS: 1.0000 | ORAL_TABLET | Freq: Every day | ORAL | 5 refills | Status: DC

## 2018-08-21 ENCOUNTER — Encounter: Payer: Self-pay | Admitting: Infectious Diseases

## 2018-09-16 ENCOUNTER — Telehealth: Payer: Self-pay

## 2018-09-16 NOTE — Telephone Encounter (Signed)
Attempted to initiate PA for patient's Symtuza. PA rep states that patient only has family planning and that the insurance would not cover medication. Spoke with Artist, Shea Evans who states that patient has ADAP and that medication is covered.  Lorenso Courier, [place]

## 2018-10-15 ENCOUNTER — Telehealth: Payer: Self-pay | Admitting: *Deleted

## 2018-10-15 NOTE — Telephone Encounter (Signed)
Patient unable to pick up Symtuza at [facility], as they were still processing the prescription under Medicaid Family Planning. RN asked them to switch to her ADAP coverage. Medication ready at $0.  Patient notified. Andree Coss, RN

## 2019-01-21 ENCOUNTER — Telehealth: Payer: Self-pay | Admitting: Pharmacy Technician

## 2019-01-21 NOTE — Telephone Encounter (Signed)
Patient called this morning concerned because [facility] will not release her medication of Symtuza. I provided her with the [employer] information and what to tell [facility] for processing. If she has any further trouble, she knows to contact the office later today.

## 2019-04-01 ENCOUNTER — Other Ambulatory Visit: Payer: BC Managed Care – PPO

## 2019-04-03 ENCOUNTER — Encounter: Payer: Self-pay | Admitting: Infectious Diseases

## 2019-04-03 ENCOUNTER — Telehealth: Payer: Self-pay | Admitting: Pharmacy Technician

## 2019-04-03 ENCOUNTER — Other Ambulatory Visit: Payer: Self-pay | Admitting: Infectious Diseases

## 2019-04-03 ENCOUNTER — Other Ambulatory Visit: Payer: Self-pay

## 2019-04-03 DIAGNOSIS — Z113 Encounter for screening for infections with a predominantly sexual mode of transmission: Secondary | ICD-10-CM

## 2019-04-03 DIAGNOSIS — B20 Human immunodeficiency virus [HIV] disease: Secondary | ICD-10-CM

## 2019-04-03 DIAGNOSIS — Z79899 Other long term (current) drug therapy: Secondary | ICD-10-CM

## 2019-04-03 LAB — T-HELPER CELL (CD4) - (RCID CLINIC ONLY)
CD4 % Helper T Cell: 32 % — ABNORMAL LOW (ref 33–65)
CD4 T Cell Abs: 437 /uL (ref 400–1790)

## 2019-04-03 NOTE — Telephone Encounter (Addendum)
[facility] Patient Advocate Encounter  Completed and sent [place] application for [employer] for this patient who is uninsured.    She is approved for up to one year of assistance until 04/06/2020.  She has been given the pharmacy information below.  This will cover the entire cost.  ID: [id number] BIN: [id number] PCN:  [id number] GRP: [id number]  Brenda Tyler E. Nadara Mustard [place] Patient [facility] Phone: [phone] Fax:  [phone]

## 2019-04-03 NOTE — Addendum Note (Signed)
Addended by: Dolan Amen D on: 04/03/2019 10:46 AM   Modules accepted: Orders

## 2019-04-08 LAB — COMPREHENSIVE METABOLIC PANEL
AG Ratio: 1.2 (calc) (ref 1.0–2.5)
ALT: 11 U/L (ref 6–29)
AST: 13 U/L (ref 10–30)
Albumin: 3.8 g/dL (ref 3.6–5.1)
Alkaline phosphatase (APISO): 114 U/L (ref 31–125)
BUN: 10 mg/dL (ref 7–25)
CO2: 29 mmol/L (ref 20–32)
Calcium: 9.1 mg/dL (ref 8.6–10.2)
Chloride: 106 mmol/L (ref 98–110)
Creat: 0.85 mg/dL (ref 0.50–1.10)
Globulin: 3.2 g/dL (calc) (ref 1.9–3.7)
Glucose, Bld: 96 mg/dL (ref 65–99)
Potassium: 4.1 mmol/L (ref 3.5–5.3)
Sodium: 140 mmol/L (ref 135–146)
Total Bilirubin: 0.5 mg/dL (ref 0.2–1.2)
Total Protein: 7 g/dL (ref 6.1–8.1)

## 2019-04-08 LAB — CBC
HCT: 38.7 % (ref 35.0–45.0)
Hemoglobin: 12.9 g/dL (ref 11.7–15.5)
MCH: 28.5 pg (ref 27.0–33.0)
MCHC: 33.3 g/dL (ref 32.0–36.0)
MCV: 85.6 fL (ref 80.0–100.0)
MPV: 10.4 fL (ref 7.5–12.5)
Platelets: 342 10*3/uL (ref 140–400)
RBC: 4.52 10*6/uL (ref 3.80–5.10)
RDW: 12.6 % (ref 11.0–15.0)
WBC: 3.5 10*3/uL — ABNORMAL LOW (ref 3.8–10.8)

## 2019-04-08 LAB — LIPID PANEL
Cholesterol: 166 mg/dL (ref <200)
HDL: 54 mg/dL (ref >=50)
LDL Cholesterol (Calc): 95 mg/dL (calc)
Non-HDL Cholesterol (Calc): 112 mg/dL (calc) (ref <130)
Total CHOL/HDL Ratio: 3.1 (calc) (ref <5.0)
Triglycerides: 77 mg/dL (ref <150)

## 2019-04-08 LAB — HIV-1 RNA QUANT-NO REFLEX-BLD
HIV 1 RNA Quant: 31 copies/mL — ABNORMAL HIGH
HIV-1 RNA Quant, Log: 1.49 Log copies/mL — ABNORMAL HIGH

## 2019-04-08 LAB — RPR: RPR Ser Ql: NONREACTIVE

## 2019-04-10 ENCOUNTER — Other Ambulatory Visit: Payer: Self-pay | Admitting: *Deleted

## 2019-04-10 DIAGNOSIS — B20 Human immunodeficiency virus [HIV] disease: Secondary | ICD-10-CM

## 2019-04-10 MED ORDER — SYMTUZA 800-150-200-10 MG PO TABS: 1.0000 | ORAL_TABLET | Freq: Every day | ORAL | 0 refills | Status: DC

## 2019-04-15 ENCOUNTER — Encounter: Payer: BC Managed Care – PPO | Admitting: Infectious Diseases

## 2019-04-20 ENCOUNTER — Other Ambulatory Visit: Payer: Self-pay

## 2019-04-20 ENCOUNTER — Encounter: Payer: Self-pay | Admitting: Infectious Diseases

## 2019-04-20 ENCOUNTER — Ambulatory Visit (INDEPENDENT_AMBULATORY_CARE_PROVIDER_SITE_OTHER): Payer: Self-pay | Admitting: Infectious Diseases

## 2019-04-20 VITALS — Wt 318.0 lb

## 2019-04-20 DIAGNOSIS — Z23 Encounter for immunization: Secondary | ICD-10-CM

## 2019-04-20 DIAGNOSIS — B20 Human immunodeficiency virus [HIV] disease: Secondary | ICD-10-CM

## 2019-04-20 DIAGNOSIS — Z113 Encounter for screening for infections with a predominantly sexual mode of transmission: Secondary | ICD-10-CM

## 2019-04-20 DIAGNOSIS — F329 Major depressive disorder, single episode, unspecified: Secondary | ICD-10-CM

## 2019-04-20 DIAGNOSIS — F32A Depression, unspecified: Secondary | ICD-10-CM

## 2019-04-20 DIAGNOSIS — Z79899 Other long term (current) drug therapy: Secondary | ICD-10-CM

## 2019-04-20 DIAGNOSIS — Z6841 Body Mass Index (BMI) 40.0 and over, adult: Secondary | ICD-10-CM

## 2019-04-20 DIAGNOSIS — N911 Secondary amenorrhea: Secondary | ICD-10-CM

## 2019-04-20 DIAGNOSIS — R87613 High grade squamous intraepithelial lesion on cytologic smear of cervix (HGSIL): Secondary | ICD-10-CM

## 2019-04-20 MED ORDER — FLUOXETINE HCL 10 MG PO CAPS: 10.0000 mg | ORAL_CAPSULE | Freq: Every day | ORAL | 1 refills | Status: DC

## 2019-04-20 NOTE — Assessment & Plan Note (Signed)
Encourage diet and exercise. 

## 2019-04-20 NOTE — Addendum Note (Signed)
Addended by: Aundria Rud on: 04/20/2019 12:18 PM   Modules accepted: Orders

## 2019-04-20 NOTE — Assessment & Plan Note (Signed)
Doing ok on symtuza.  Would consider for cabo-rilpivirine when availlabe.  Offered/refused condoms.  rtc in 9 months Flu and PCV 23 today HPV as well.  rtc in 1 month to check on efficacy of prozac.

## 2019-04-20 NOTE — Assessment & Plan Note (Signed)
Will start her on prozac ( more activating).

## 2019-04-20 NOTE — Assessment & Plan Note (Signed)
Will have her seen by GYN.  ? If she needs HPV vax Needs PAP.

## 2019-04-20 NOTE — Assessment & Plan Note (Signed)
Will send to GYN for f/u.

## 2019-04-20 NOTE — Progress Notes (Signed)
   Subjective:    Patient ID: Brenda Tyler, female    DOB: 08/06/1985, 33 y.o.   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 Previously started on bactrim and stribild---> symtuza. She has had periods of intermittent adherence.  Her last PAP was 04-2018 (normal).  No problems with her ART- occas missed meds (once a month). We spoke about injectable art.  She states the pill depresses her when she takes it.  Today she complains of anxiety- generalized, can't think of particular stressor. Feels depressed, acts out, crying. "Something's wrong with me".    achiness- in her knees and her bones. Not weather related. Does not take rx for it.  Takes tylenol prn for headaches. Lays down, eat something. No change in her vision or sense of smell. No flashing lights.    Amenorrheic- "they said I have early menopause".   HIV 1 RNA Quant (copies/mL)  Date Value  04/03/2019 31 (H)  06/30/2018 79 (H)  01/10/2018 <20 DETECTED   CD4 T Cell Abs (/uL)  Date Value  04/03/2019 437  06/30/2018 400  10/31/2017 330 (L)     Review of Systems  Constitutional: Positive for fatigue. Negative for appetite change and unexpected weight change.  Respiratory: Negative for cough and choking.   Gastrointestinal: Negative for constipation and diarrhea.  Genitourinary: Negative for difficulty urinating and menstrual problem.  Musculoskeletal: Positive for arthralgias.  Psychiatric/Behavioral: Positive for dysphoric mood.  Please see HPI. All other systems reviewed and negative.      Objective:   Physical Exam Constitutional:      Appearance: Normal appearance. She is obese.  HENT:     Mouth/Throat:     Mouth: Mucous membranes are moist.  Eyes:     Extraocular Movements: Extraocular movements intact.     Pupils: Pupils are equal, round, and reactive to light.  Neck:     Musculoskeletal: Normal range of motion and neck supple.  Cardiovascular:     Rate and  Rhythm: Normal rate and regular rhythm.  Pulmonary:     Effort: Pulmonary effort is normal.     Breath sounds: Normal breath sounds.  Abdominal:     General: Bowel sounds are normal. There is no distension.     Palpations: Abdomen is soft.     Tenderness: There is no abdominal tenderness.  Musculoskeletal: Normal range of motion.     Right lower leg: No edema.     Left lower leg: No edema.  Neurological:     General: No focal deficit present.     Mental Status: She is alert.            Assessment & Plan:

## 2019-05-05 ENCOUNTER — Other Ambulatory Visit: Payer: Self-pay | Admitting: Infectious Diseases

## 2019-05-05 DIAGNOSIS — B20 Human immunodeficiency virus [HIV] disease: Secondary | ICD-10-CM

## 2019-05-19 ENCOUNTER — Ambulatory Visit: Payer: Medicaid Other | Admitting: Infectious Diseases

## 2019-05-28 ENCOUNTER — Telehealth: Payer: Self-pay | Admitting: Obstetrics & Gynecology

## 2019-05-28 NOTE — Telephone Encounter (Signed)
Attempted to contact patient w/ her appointment change due to a change in the provider's schedule. No answer number stated that the call could not be completed at this time. Reminder sent as well as a Therapist, music.

## 2019-06-04 ENCOUNTER — Telehealth: Payer: Self-pay

## 2019-06-04 ENCOUNTER — Ambulatory Visit: Payer: Medicaid Other | Admitting: Family Medicine

## 2019-06-04 ENCOUNTER — Ambulatory Visit: Payer: Medicaid Other | Admitting: Infectious Diseases

## 2019-06-04 NOTE — Telephone Encounter (Signed)
Attempted to call patient to reschedule appointment from today. Left voicemail  Requesting patient call office back to offer new appointment. [place]

## 2019-06-30 ENCOUNTER — Telehealth: Payer: Self-pay | Admitting: Family Medicine

## 2019-06-30 NOTE — Telephone Encounter (Signed)
Pt was explained that her ID provider recommended the colposcopy so to please come to the appt and the provider can explain to her in more details if it needed.  I explained to what the colposcopy is for and how the procedure entails.  Pt verbalized understanding.   Addison Naegeli, RN 06/30/19

## 2019-06-30 NOTE — Telephone Encounter (Signed)
Spoke with patient about her appointment change, and she stated nobody called her to explain what the appointment was for. She is requesting a call from the nurse to explain what this visit is for.

## 2019-07-08 ENCOUNTER — Ambulatory Visit: Payer: Medicaid Other | Admitting: Certified Nurse Midwife

## 2019-07-30 ENCOUNTER — Ambulatory Visit (INDEPENDENT_AMBULATORY_CARE_PROVIDER_SITE_OTHER): Payer: Medicaid Other | Admitting: Obstetrics and Gynecology

## 2019-07-30 ENCOUNTER — Other Ambulatory Visit (HOSPITAL_COMMUNITY)
Admission: RE | Admit: 2019-07-30 | Discharge: 2019-07-30 | Disposition: A | Discharge status: Home / Self Care | Payer: Medicaid Other | Source: Ambulatory Visit | Attending: Obstetrics and Gynecology | Admitting: Obstetrics and Gynecology

## 2019-07-30 ENCOUNTER — Other Ambulatory Visit: Payer: Self-pay

## 2019-07-30 ENCOUNTER — Encounter: Payer: Self-pay | Admitting: Obstetrics and Gynecology

## 2019-07-30 VITALS — BP 122/93 | HR 69 | Ht 65.0 in | Wt 315.4 lb

## 2019-07-30 DIAGNOSIS — Z124 Encounter for screening for malignant neoplasm of cervix: Secondary | ICD-10-CM | POA: Diagnosis not present

## 2019-07-30 DIAGNOSIS — Z319 Encounter for procreative management, unspecified: Secondary | ICD-10-CM

## 2019-07-30 DIAGNOSIS — Z0000 Encounter for general adult medical examination without abnormal findings: Secondary | ICD-10-CM | POA: Diagnosis not present

## 2019-07-30 DIAGNOSIS — Z01419 Encounter for gynecological examination (general) (routine) without abnormal findings: Secondary | ICD-10-CM

## 2019-07-30 DIAGNOSIS — Z1331 Encounter for screening for depression: Secondary | ICD-10-CM

## 2019-07-30 HISTORY — DX: Encounter for procreative management, unspecified: Z31.9

## 2019-07-30 LAB — POCT PREGNANCY, URINE: Preg Test, Ur: NEGATIVE

## 2019-07-30 NOTE — Progress Notes (Signed)
Knots in Breast & sharpe pains in breast also.Also cramps come from no where. No period for 8 years.

## 2019-07-30 NOTE — Progress Notes (Signed)
Patient ID: Brenda Tyler, female   DOB: 08/20/1985, 34 y.o.   MRN: [id number]  SELITA STAIGER is a 34 y.o. [phone] female here for a routine annual gynecologic exam.  Current complaints: desire for pregnancy.   Has talked to Dr April Manson    H/O abnormal pap smears in the past. Several colposcopies, ? Cryo  HIV, followed by ID  H/O of amenorrhea for several yrs.   Gynecologic History No LMP recorded. (Menstrual status: Other). Contraception: none Last Pap: 2019. Results were: normal   Obstetric History OB History  Gravida Para Term Preterm AB Living  4 3 3   1 3   SAB TAB Ectopic Multiple Live Births    1          # Outcome Date GA Lbr Len/2nd Weight Sex Delivery Anes PTL Lv  4 TAB           3 Term           2 Term           1 Term             Past Medical History:  Diagnosis Date  . 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 HIV (human immunodeficiency virus infection) (HCC)   . HSV (herpes simplex virus) infection   . Hypertension   . Obesity     Past Surgical History:  Procedure Laterality Date  . INCISION AND DRAINAGE ABSCESS     on abdomen and buttocks    Current Outpatient Medications on File Prior to Visit  Medication Sig Dispense Refill  . SYMTUZA [id number] MG TABS TAKE 1 TABLET BY MOUTH DAILY WITH BREAKFAST 30 tablet 5  . albuterol (PROVENTIL HFA;VENTOLIN HFA) 108 (90 Base) MCG/ACT inhaler Inhale 2 puffs into the lungs every 4 (four) hours as needed for wheezing or shortness of breath. (Patient not taking: Reported on 04/20/2019) 1 Inhaler 0  . benzonatate (TESSALON) 100 MG capsule Take 1 capsule (100 mg total) by mouth every 8 (eight) hours. (Patient not taking: Reported on 04/20/2019) 21 capsule 0  . FLUoxetine (PROZAC) 10 MG capsule Take 1 capsule (10 mg total) by mouth daily. (Patient not taking: Reported on 07/30/2019) 30 capsule 1  . ondansetron (ZOFRAN ODT) 4  MG disintegrating tablet Take 1 tablet (4 mg total) by mouth every 8 (eight) hours as needed for nausea or vomiting. (Patient not taking: Reported on 04/20/2019) 20 tablet 0   No current facility-administered medications on file prior to visit.    Allergies  Allergen Reactions  . Dilaudid [Hydromorphone Hcl] Other (See Comments)    Makes her dizzy    Social History   Socioeconomic History  . Marital status: Single    Spouse name: Not on file  . Number of children: Not on file  . Years of education: Not on file  . Highest education level: Not on file  Occupational History  . Not on file  Tobacco Use  . Smoking status: Never Smoker  . Smokeless tobacco: Never Used  Substance and Sexual Activity  . Alcohol use: Yes    Alcohol/week: 2.0 standard drinks    Types: 2 Standard drinks or equivalent per week    Comment: occasional  . Drug use: No    Frequency: 2.0 times per week    Types: Marijuana    Comment: pt states no  drugs  . Sexual activity: Yes    Birth control/protection: None    Comment: pt. given condoms  Other Topics Concern  . Not on file  Social History Narrative  . Not on file   Social Determinants of Health   Financial Resource Strain:   . Difficulty of Paying Living Expenses: Not on file  Food Insecurity:   . Worried About Charity fundraiser in the Last Year: Not on file  . Ran Out of Food in the Last Year: Not on file  Transportation Needs:   . Lack of Transportation (Medical): Not on file  . Lack of Transportation (Non-Medical): Not on file  Physical Activity:   . Days of Exercise per Week: Not on file  . Minutes of Exercise per Session: Not on file  Stress:   . Feeling of Stress : Not on file  Social Connections:   . Frequency of Communication with Friends and Family: Not on file  . Frequency of Social Gatherings with Friends and Family: Not on file  . Attends Religious Services: Not on file  . Active Member of Clubs or Organizations: Not on file   . Attends Archivist Meetings: Not on file  . Marital Status: Not on file  Intimate Partner Violence:   . Fear of Current or Ex-Partner: Not on file  . Emotionally Abused: Not on file  . Physically Abused: Not on file  . Sexually Abused: Not on file    Family History  Problem Relation Age of Onset  . Heart disease Mother   . Hypertension Maternal Grandmother   . Diabetes Maternal Grandmother   . Heart disease Maternal Grandmother   . Cancer Maternal Grandmother   . Hypertension Paternal Grandmother   . Diabetes Paternal Grandmother   . Heart disease Paternal Grandmother     The following portions of the patient's history were reviewed and updated as appropriate: allergies, current medications, past family history, past medical history, past social history, past surgical history and problem list.  Review of Systems Pertinent items noted in HPI and remainder of comprehensive ROS otherwise negative.   Objective:  BP (!) 122/93   Pulse 69   Ht 5\' 5"  (1.651 m)   Wt (!) 315 lb 6.4 oz (143.1 kg)   BMI 52.49 kg/m  CONSTITUTIONAL: Well-developed, well-nourished female in no acute distress.  HENT:  Normocephalic, atraumatic, External right and left ear normal. Oropharynx is clear and moist EYES: Conjunctivae and EOM are normal. Pupils are equal, round, and reactive to light. No scleral icterus.  NECK: Normal range of motion, supple, no masses.  Normal thyroid.  SKIN: Skin is warm and dry. No rash noted. Not diaphoretic. No erythema. No pallor. [place]: Alert and oriented to person, place, and time. Normal reflexes, muscle tone coordination. No cranial nerve deficit noted. PSYCHIATRIC: Normal mood and affect. Normal behavior. Normal judgment and thought content. CARDIOVASCULAR: Normal heart rate noted, regular rhythm RESPIRATORY: Clear to auscultation bilaterally. Effort and breath sounds normal, no problems with respiration noted. BREASTS: declined by pt ABDOMEN: Soft,  normal bowel sounds, no distention noted.  No tenderness, rebound or guarding.  PELVIC: Normal appearing external genitalia; normal appearing vaginal mucosa and cervix.  No abnormal discharge noted.  Pap smear obtained.  Normal uterine size, no other palpable masses, no uterine or adnexal tenderness. MUSCULOSKELETAL: Normal range of motion. No tenderness.  No cyanosis, clubbing, or edema.  2+ distal pulses.   Assessment:  Annual gynecologic examination with pap smear  Desire  for pregnancy Ammenorrhea Plan:  Will follow up results of pap smear and manage accordingly. Recommend to continue follow up with Dr April Manson for pregnancy desire and ammenorrhea Routine preventative health maintenance measures emphasized. Please refer to After Visit Summary for other counseling recommendations.    Hermina Staggers, MD, FACOG Attending Obstetrician & Gynecologist Center for [facility], [facility]

## 2019-07-30 NOTE — Addendum Note (Signed)
Addended by: Hermina Staggers on: 07/30/2019 11:08 AM   Modules accepted: Level of Service

## 2019-07-31 NOTE — Addendum Note (Signed)
Addended by: Hulda Marin C on: 07/31/2019 11:51 AM   Modules accepted: Orders

## 2019-08-03 LAB — CYTOLOGY - PAP
Comment: NEGATIVE
Diagnosis: NEGATIVE
High risk HPV: NEGATIVE

## 2019-08-10 ENCOUNTER — Ambulatory Visit (INDEPENDENT_AMBULATORY_CARE_PROVIDER_SITE_OTHER): Payer: PRIVATE HEALTH INSURANCE | Admitting: Clinical

## 2019-08-10 DIAGNOSIS — F332 Major depressive disorder, recurrent severe without psychotic features: Secondary | ICD-10-CM

## 2019-08-10 NOTE — BH Specialist Note (Signed)
Integrated Behavioral Health via Telemedicine Video Visit  08/10/2019 Brenda Tyler [id number]  Number of Integrated Behavioral Health visits: 1 Session Start time: 3:21  Session End time: 4:22 Total time: 37  Referring Provider: Nettie Elm, MD Type of Visit: Video Patient/Family location: [facility] Provider location: [facility] All persons participating in visit: Patient Brenda Tyler and Brenda Tyler    Confirmed patient's address: Yes  Confirmed patient's phone number: Yes  Any changes to demographics: No   Confirmed patient's insurance: Yes  Any changes to patient's insurance: No   Discussed confidentiality: Yes   I connected with Brenda Tyler  by a video enabled telemedicine application and verified that I am speaking with the correct person using two identifiers.     I discussed the limitations of evaluation and management by telemedicine and the availability of in person appointments.  I discussed that the purpose of this visit is to provide behavioral health care while limiting exposure to the novel coronavirus.   Discussed there is a possibility of technology failure and discussed alternative modes of communication if that failure occurs.  I discussed that engaging in this video visit, they consent to the provision of behavioral healthcare and the services will be billed under their insurance.  Patient and/or legal guardian expressed understanding and consented to video visit: Yes   PRESENTING CONCERNS: Patient and/or family reports the following symptoms/concerns: Pt states her primary symptoms are feeling "tired, sad, no interest in doing things and thinking the worst"; used to enjoy cooking and going out, and traveling. Pt prefers no medication at this time and denies current SI.  Duration of problem: Began in 2015 with HIV diagnosis, then 2016 loss of her mother; Severity of problem: severe  STRENGTHS (Protective Factors/Coping Skills): Supportive  family  GOALS ADDRESSED: Patient will: 1.  Reduce symptoms of: anxiety, depression and stress  2.  Increase knowledge and/or ability of: healthy habits and stress reduction  3.  Demonstrate ability to: Increase healthy adjustment to current life circumstances and Increase motivation to adhere to plan of care  INTERVENTIONS: Interventions utilized:  Behavioral Activation and Psychoeducation and/or Health Education Standardized Assessments completed: PHQ9/GAD7 taken in last two weeks  ASSESSMENT: Patient currently experiencing Major depressive disorder, recurrent, severe, without psychotic features.   Patient may benefit from psychoeducation and brief therapeutic interventions regarding coping with symptoms of depression and anxiety .  PLAN: 1. Follow up with behavioral health clinician on : Two weeks 2. Behavioral recommendations:  -Follow Safety Plan (see crisis numbers in After Visit Summary, if SI returns) -Cook at least one meal with children weekly, starting this week -Plan one fun outing weekly (other than work) within the next two weeks -Find out process of obtaining passport (what documents do you need, cost, and is appointment needed) -Continue looking at houses for sale at least weekly -Consider apps (on After Visit Summary) for distraction from negative thoughts 3. Referral(s): Integrated [employer] (In Clinic)  I discussed the assessment and treatment plan with the patient and/or parent/guardian. They were provided an opportunity to ask questions and all were answered. They agreed with the plan and demonstrated an understanding of the instructions.   They were advised to call back or seek an in-person evaluation if the symptoms worsen or if the condition fails to improve as anticipated.  Brenda Tyler  Depression screen [facility] 2/9 07/31/2019 05/19/2018 01/10/2018 01/30/2017 09/05/2015  Decreased Interest 2 2 0 2 -  Down, Depressed, Hopeless 2 2 0 0  0  PHQ -  2 Score 4 4 0 2 0  Altered sleeping 2 2 - 2 -  Tired, decreased energy 2 2 - 2 -  Change in appetite 2 0 - 0 -  Feeling bad or failure about yourself  3 0 - 0 -  Trouble concentrating 3 0 - 0 -  Moving slowly or fidgety/restless 3 0 - 0 -  Suicidal thoughts 3 0 - 0 -  PHQ-9 Score 22 8 - 6 -   GAD 7 : Generalized Anxiety Score 07/31/2019 05/19/2018 01/30/2017  Nervous, Anxious, on Edge 3 0 0  Control/stop worrying 3 2 0  Worry too much - different things 3 2 0  Trouble relaxing 3 0 0  Restless 3 0 0  Easily annoyed or irritable 2 0 0  Afraid - awful might happen 3 0 0  Total GAD 7 Score 20 4 0

## 2019-08-10 NOTE — Patient Instructions (Signed)
Behavioral Health Resources:   What if I or someone I know is in crisis?  . If you are thinking about harming yourself or having thoughts of suicide, or if you know someone who is, seek help right away.  . Call your doctor or mental health care provider.  . Call 911 or go to a hospital emergency room to get immediate help, or ask a friend or family member to help you do these things.  . Call the [place] National Suicide Prevention Lifeline's toll-free, 24-hour hotline at 1-800-273-TALK (1-800-273-8255) or TTY: 1-800-799-4 TTY (1-800-799-4889) to talk to a trained counselor.  . If you are in crisis, make sure you are not left alone.   . If someone else is in crisis, make sure he or she is not left alone   24 Hour :   [place] National Suicide Hotline: 1-800-273-8255  Therapeutic Alternative Mobile Crisis: 1-877-626-1772   [facility]  [address], [place], [place] [address]  800-711-2635 or 336-832-9700  [facility] Crisis Line (Domestic Violence, Rape & Victim Assistance)  336-273-7273  [facility]  [address] [place], [place]  [address]   1-855-788-8787 or 336-676-6840   [facility] Services: 336-899-1505 (8am-4pm) or 1-866- 261-5769 (after hours)           [facility], [address], [place], [place]  1-800-711-2635 Fax: 336-832-9701 [email or web address]  *Interpreters available *Accepts Medicaid, Medicare, uninsured  [facility]   Mon-Fri: 8am-5pm [address], [place], [place] [phone](phone); 336-272-9885(fax) www.[email or web address]  *Accepts Medicare  [facility] Mon, Tues, Thurs, Fri: 8am-4pm [address], [place], [place]  [phone] (phone); 336-256-0121 (fax) www.[email or web address]  *Accepts Medicare  [facility] Mon-Fri: 9am-5pm  [address], [place], [place] [phone] (phone); 336-540-9454  www.[email or web address]  *Accepts Medicaid  [facility] [address], [place], [place]  [phone]  [email or web address]   [facility] Mon-Fri, 8:30am-12pm/1pm-2:30pm [address], [place], [place] [phone] (phone); 336-387-9167 (fax) www.fspcares.org  *Accepts Medicaid, sliding-scale*Bilingual services available  [facility] Mon-Fri, 8am-7pm [address], [place], [place]  [phone](phone); 336-899-8811(fax) www.[email or web address]  *Accepts Medicaid *Bilingual services available  [facility] Mon-Fri: 8am-5pm, Saturday by appointment only [address], [place], [place] [phone] (phone); [phone] (fax) www.[email or web address]   [facility] [address], [place], [place] [phone] www.[email or web address]  *Free & reduced services for uninsured and underinsured individuals *Bilingual services for Spanish-speaking clients 21 and under  [facility], [address], [place], [place] 336-676-6409(phone); 336-676-6409(fax) www.[facility].org  *Bring your own interpreter at first visit *Accepts Medicare and Medicaid  [facility] Mon-Fri: 9am-5:30pm [address], [place], [place] [phone] (phone), [phone] (fax) After hours crisis line: 336-763-1165 www.[email or web address]  *Accepts Medicare and Medicaid  [facility] Mon-Thurs, 8am-6pm [address], [place], [place]  [phone] (phone); [phone] (fax) http://[email or web address]  *Subsidized costs available  [facility] Mon-Fri: 8am-4pm [address], [place],  [place] [phone](phone); 336-834-9698(fax) www.p[email or web address]  *Accepts Medicaid  [facility] Same day access hours: Mon-Fri, 8:30-3pm Crisis hours: Mon-Fri, 8am-5pm [address], [place], [place]  [facility] Same day access hours: Mon-Fri, 8:30-3pm Crisis hours: Mon-Fri, 8am-8pm [address], [place], [place] [phone] (phone); 336-899-1513 (fax) www.[facility].org  *Accepts Medicaid and Medicare  [facility] Mon, Wed, Fri: 9am-9pm Tues, Thurs: 9am-6pm [address],   [place], [place]  [phone] (phone); [phone] (fax) https://[email or web address]  *(Accepts Medicare and Medicaid; payment plans available)*Bilingual services available  [facility] [address], [place], [place] 336-272-1182 (phone); 336-272-1182 (fax) www.[email or web address]   [facility] [address], [place], [place]  [phone]  www.[email or web address]  *Bilingual services available  [facility] Mon-Thurs: 8am-8pm [address], [place], [place] [phone] (phone); [phone] (fax) [email or web address]://[email or web address]  *Accepts Medicaid*Bilingual services available  [facility] [address], [place], [place] [phone] (phone) [email or web address]://[email or web address]  *Accepts Medicaid *Bilingual services available  [facility] Mon-Fri, 9am-4:45pm [address], [place], [place] [phone] (phone); [phone] (fax) http://[email or web address]  *Accepts Medicare  [facility] Mon-Thurs: 8:30-8pm, Fri: 8:30am-7pm [address], [place], [place] (3rd floor) [phone] (phone); [phone] (fax) [email or web address]  *Accepts Medicaid; income-based reduced rates available  [facility] Mon-Fri: 8am-5pm [address], [place],  [place] [phone] (phone); [phone] (fax) [email or web address]://[email or web address]  *Accepts Medicaid*Bilingual services available  [facility] [address], [place], [place]  [phone] (phone); [phone] (fax) www.[facility].org  *Free emergency housing and clinical services for youth in crisis  [facility] [place])  [address], [place] 336-373-1402 www.mhag.org  *Provides direct services to individuals in recovery from mental illness, including support groups, recovery skills classes, and one on one peer support  NAMI (National Alliance on Mental Illness) [place] NAMI helpline: 336-370-4264  https://namiguilford.org  *A community hub for information relating to local resources and services for the friends and families of individuals living alongside a mental health condition, as well as the individuals themselves. Classes and support groups also provided   /Emotional Wellbeing Apps and Websites Here are a few free apps meant to help you to help yourself.  To find, try searching on the internet to see if the app is offered on Apple/Android devices. If your first choice doesn't come up on your device, the good news is that there are many choices! Play around with different apps to see which ones are helpful to you.    Calm This is an app meant to help increase calm feelings. Includes info, strategies, and tools for tracking your feelings.      Calm Harm  This app is meant to help with self-harm. Provides many 5-minute or 15-min coping strategies for doing instead of hurting yourself.       Healthy Minds Health Minds is a problem-solving tool to help deal with emotions and cope with stress you encounter wherever you are.      MindShift This app can help people cope with anxiety. Rather than trying to avoid anxiety, you can make an important shift and face it.      MY3  MY3 features a support system, safety plan and  resources with the goal of offering a tool to use in a time of need.       My Life My Voice  This mood journal offers a simple solution for tracking your thoughts, feelings and moods. Animated emoticons can help identify your mood.       Relax Melodies Designed to help with sleep, on this app you can mix sounds and meditations for relaxation.      Smiling Mind Smiling Mind is meditation made easy: it's a simple tool that helps put a smile on your mind.        Stop, Breathe & Think  A friendly, simple guide for people through meditations for mindfulness and compassion.  Stop,   Breathe and Think Kids Enter your current feelings and choose a "mission" to help you cope. Offers videos for certain moods instead of just sound recordings.       Team Orange The goal of this tool is to help teens change how they think, act, and react. This app helps you focus on your own good feelings and experiences.      The Virtual Hope Box The Virtual Hope Box (VHB) contains simple tools to help patients with coping, relaxation, distraction, and positive thinking.      

## 2019-08-24 ENCOUNTER — Ambulatory Visit: Payer: Medicaid Other | Admitting: Clinical

## 2019-08-24 ENCOUNTER — Other Ambulatory Visit: Payer: Self-pay

## 2019-08-24 DIAGNOSIS — Z91199 Patient's noncompliance with other medical treatment and regimen due to unspecified reason: Secondary | ICD-10-CM

## 2019-08-24 DIAGNOSIS — Z5329 Procedure and treatment not carried out because of patient's decision for other reasons: Secondary | ICD-10-CM

## 2019-08-24 NOTE — BH Specialist Note (Signed)
Pt did not arrive to video visit and did not answer the phone ; Left HIPPA-compliant message to call back [name] from [facility] at [phone].  ; left MyChart message for patient.    [facility] via Telemedicine Video Visit  08/24/2019 Brenda Tyler [id number]   [name]

## 2019-09-01 ENCOUNTER — Other Ambulatory Visit: Payer: Self-pay | Admitting: Infectious Diseases

## 2019-09-01 DIAGNOSIS — B20 Human immunodeficiency virus [HIV] disease: Secondary | ICD-10-CM

## 2019-10-04 ENCOUNTER — Emergency Department (HOSPITAL_COMMUNITY): Payer: Medicaid Other

## 2019-10-04 ENCOUNTER — Emergency Department (HOSPITAL_COMMUNITY)
Admission: EM | Admit: 2019-10-04 | Discharge: 2019-10-04 | Disposition: A | Discharge status: Home / Self Care | Payer: Medicaid Other | Attending: Emergency Medicine | Admitting: Emergency Medicine

## 2019-10-04 ENCOUNTER — Encounter (HOSPITAL_COMMUNITY): Payer: Self-pay

## 2019-10-04 DIAGNOSIS — R1032 Left lower quadrant pain: Secondary | ICD-10-CM | POA: Insufficient documentation

## 2019-10-04 DIAGNOSIS — R109 Unspecified abdominal pain: Secondary | ICD-10-CM | POA: Diagnosis not present

## 2019-10-04 DIAGNOSIS — R1010 Upper abdominal pain, unspecified: Secondary | ICD-10-CM

## 2019-10-04 DIAGNOSIS — Z79899 Other long term (current) drug therapy: Secondary | ICD-10-CM | POA: Diagnosis not present

## 2019-10-04 DIAGNOSIS — Z21 Asymptomatic human immunodeficiency virus [HIV] infection status: Secondary | ICD-10-CM | POA: Diagnosis not present

## 2019-10-04 DIAGNOSIS — R1084 Generalized abdominal pain: Secondary | ICD-10-CM | POA: Diagnosis not present

## 2019-10-04 LAB — URINALYSIS, ROUTINE W REFLEX MICROSCOPIC
Bacteria, UA: NONE SEEN
Bilirubin Urine: NEGATIVE
Glucose, UA: NEGATIVE mg/dL
Hgb urine dipstick: NEGATIVE
Ketones, ur: NEGATIVE mg/dL
Nitrite: NEGATIVE
Protein, ur: NEGATIVE mg/dL
Specific Gravity, Urine: 1.017 (ref 1.005–1.030)
pH: 7 (ref 5.0–8.0)

## 2019-10-04 LAB — CBC
HCT: 38.5 % (ref 36.0–46.0)
Hemoglobin: 12.3 g/dL (ref 12.0–15.0)
MCH: 29.5 pg (ref 26.0–34.0)
MCHC: 31.9 g/dL (ref 30.0–36.0)
MCV: 92.3 fL (ref 80.0–100.0)
Platelets: 297 10*3/uL (ref 150–400)
RBC: 4.17 MIL/uL (ref 3.87–5.11)
RDW: 13.4 % (ref 11.5–15.5)
WBC: 5.3 10*3/uL (ref 4.0–10.5)
nRBC: 0 % (ref 0.0–0.2)

## 2019-10-04 LAB — DIFFERENTIAL
Abs Immature Granulocytes: 0.01 10*3/uL (ref 0.00–0.07)
Basophils Absolute: 0 10*3/uL (ref 0.0–0.1)
Basophils Relative: 0 %
Eosinophils Absolute: 0.2 10*3/uL (ref 0.0–0.5)
Eosinophils Relative: 3 %
Immature Granulocytes: 0 %
Lymphocytes Relative: 33 %
Lymphs Abs: 1.7 10*3/uL (ref 0.7–4.0)
Monocytes Absolute: 0.5 10*3/uL (ref 0.1–1.0)
Monocytes Relative: 10 %
Neutro Abs: 2.7 10*3/uL (ref 1.7–7.7)
Neutrophils Relative %: 54 %

## 2019-10-04 LAB — COMPREHENSIVE METABOLIC PANEL
ALT: 15 U/L (ref 0–44)
AST: 17 U/L (ref 15–41)
Albumin: 3.4 g/dL — ABNORMAL LOW (ref 3.5–5.0)
Alkaline Phosphatase: 115 U/L (ref 38–126)
Anion gap: 6 (ref 5–15)
BUN: 14 mg/dL (ref 6–20)
CO2: 27 mmol/L (ref 22–32)
Calcium: 8.6 mg/dL — ABNORMAL LOW (ref 8.9–10.3)
Chloride: 106 mmol/L (ref 98–111)
Creatinine, Ser: 0.82 mg/dL (ref 0.44–1.00)
GFR calc Af Amer: >60 mL/min (ref >60)
GFR calc non Af Amer: >60 mL/min (ref >60)
Glucose, Bld: 97 mg/dL (ref 70–99)
Potassium: 4.4 mmol/L (ref 3.5–5.1)
Sodium: 139 mmol/L (ref 135–145)
Total Bilirubin: 0.3 mg/dL (ref 0.3–1.2)
Total Protein: 7.4 g/dL (ref 6.5–8.1)

## 2019-10-04 LAB — LIPASE, BLOOD: Lipase: 50 U/L (ref 11–51)

## 2019-10-04 LAB — I-STAT BETA HCG BLOOD, ED (MC, WL, AP ONLY): I-stat hCG, quantitative: <5 m[IU]/mL (ref <5)

## 2019-10-04 MED ORDER — SODIUM CHLORIDE 0.9 % IV BOLUS (SEPSIS)
1000.0000 mL | Freq: Once | INTRAVENOUS | Status: AC
  Administered 2019-10-04: 11:00:00 1000 mL via INTRAVENOUS

## 2019-10-04 MED ORDER — HYDROCODONE-ACETAMINOPHEN 5-325 MG PO TABS: 1.0000 | ORAL_TABLET | Freq: Four times a day (QID) | ORAL | 0 refills | Status: DC | PRN

## 2019-10-04 MED ORDER — SODIUM CHLORIDE 0.9% FLUSH
3.0000 mL | Freq: Once | INTRAVENOUS | Status: AC
  Administered 2019-10-04: 3 mL via INTRAVENOUS

## 2019-10-04 MED ORDER — ONDANSETRON HCL 4 MG/2ML IJ SOLN
4.0000 mg | Freq: Once | INTRAMUSCULAR | Status: AC
  Administered 2019-10-04: 11:00:00 4 mg via INTRAVENOUS
  Filled 2019-10-04: qty 2

## 2019-10-04 MED ORDER — SODIUM CHLORIDE (PF) 0.9 % IJ SOLN
INTRAMUSCULAR | Status: AC
  Filled 2019-10-04: qty 50

## 2019-10-04 MED ORDER — MORPHINE SULFATE (PF) 4 MG/ML IV SOLN
4.0000 mg | Freq: Once | INTRAVENOUS | Status: AC
  Administered 2019-10-04: 11:00:00 4 mg via INTRAVENOUS
  Filled 2019-10-04: qty 1

## 2019-10-04 MED ORDER — PANTOPRAZOLE SODIUM 40 MG IV SOLR
40.0000 mg | Freq: Once | INTRAVENOUS | Status: AC
  Administered 2019-10-04: 13:00:00 40 mg via INTRAVENOUS
  Filled 2019-10-04: qty 40

## 2019-10-04 MED ORDER — IOHEXOL 300 MG/ML  SOLN
100.0000 mL | Freq: Once | INTRAMUSCULAR | Status: AC | PRN
  Administered 2019-10-04: 100 mL via INTRAVENOUS

## 2019-10-04 MED ORDER — PANTOPRAZOLE SODIUM 20 MG PO TBEC: 20.0000 mg | DELAYED_RELEASE_TABLET | Freq: Every day | ORAL | 0 refills | Status: DC

## 2019-10-04 NOTE — Discharge Instructions (Addendum)
Follow-up with your doctor in 1 to 2 weeks for recheck 

## 2019-10-04 NOTE — ED Triage Notes (Signed)
Pt presents with c/o abdominal pain for one week. Pt reports the pain is in the left lower quadrant, denies any N/V/D.

## 2019-10-05 NOTE — ED Provider Notes (Signed)
[facility] DEPT Provider Note   CSN: [id number] Arrival date & time: 10/04/19  4580     History Chief Complaint  Patient presents with  . Abdominal Pain    Brenda Tyler is a 34 y.o. female.  Patient complains of left lower quadrant abdominal pain  The history is provided by the patient. No language interpreter was used.  Abdominal Pain Pain location:  Generalized Pain quality: aching   Pain radiates to:  Does not radiate Pain severity:  Moderate Onset quality:  Sudden Timing:  Intermittent Progression:  Unchanged Chronicity:  New Context: not alcohol use   Associated symptoms: no chest pain, no cough, no diarrhea, no fatigue and no hematuria        Past Medical History:  Diagnosis Date  . 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 HIV (human immunodeficiency virus infection) ([place])   . HSV (herpes simplex virus) infection   . Hypertension   . Obesity     Patient Active Problem List   Diagnosis Date Noted  . Visit for routine gyn exam 07/30/2019  . Desire for pregnancy 07/30/2019  . Dislocation of knee, anterior, left, closed 01/21/2016  . Closed anterior dislocation of left knee 01/21/2016  . Depression 07/26/2014  . Oropharyngeal candidiasis 07/05/2014  . 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 Nausea without vomiting 07/02/2014  . HIV disease ([place]) 04/23/2014  . General counseling for prescription of oral contraceptives 06/11/2013  . 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 Obesity 12/22/2012    Past Surgical History:  Procedure Laterality Date  . INCISION AND DRAINAGE ABSCESS     on abdomen and buttocks     OB History    Gravida  4   Para  3   Term  3   Preterm      AB  1   Living  3     SAB      TAB  1   Ectopic      Multiple      Live Births              Family History  Problem Relation Age of Onset  . Heart  disease Mother   . Hypertension Maternal Grandmother   . Diabetes Maternal Grandmother   . Heart disease Maternal Grandmother   . Cancer Maternal Grandmother   . Hypertension Paternal Grandmother   . Diabetes Paternal Grandmother   . Heart disease Paternal Grandmother     Social History   Tobacco Use  . Smoking status: Never Smoker  . Smokeless tobacco: Never Used  Substance Use Topics  . Alcohol use: Yes    Alcohol/week: 2.0 standard drinks    Types: 2 Standard drinks or equivalent per week    Comment: occasional  . Drug use: No    Frequency: 2.0 times per week    Types: Marijuana    Comment: pt states no drugs    Home Medications Prior to Admission medications   Medication Sig Start Date End Date Taking? Authorizing Provider  SYMTUZA [id number] MG TABS TAKE 1 TABLET BY MOUTH DAILY WITH BREAKFAST 09/01/19  Yes Campbell Riches, MD  albuterol (PROVENTIL HFA;VENTOLIN HFA) 108 (90 Base) MCG/ACT inhaler Inhale 2 puffs into the lungs every 4 (four) hours as needed for wheezing or shortness of breath. Patient not taking: Reported on 04/20/2019 07/30/18   Joy,  Shawn C, PA-C  benzonatate (TESSALON) 100 MG capsule Take 1 capsule (100 mg total) by mouth every 8 (eight) hours. Patient not taking: Reported on 04/20/2019 07/30/18   Anselm Pancoast, PA-C  FLUoxetine (PROZAC) 10 MG capsule Take 1 capsule (10 mg total) by mouth daily. Patient not taking: Reported on 07/30/2019 04/20/19   Ginnie Smart, MD  HYDROcodone-acetaminophen (NORCO/VICODIN) 5-325 MG tablet Take 1 tablet by mouth every 6 (six) hours as needed for moderate pain. 10/04/19   Bethann Berkshire, MD  ondansetron (ZOFRAN ODT) 4 MG disintegrating tablet Take 1 tablet (4 mg total) by mouth every 8 (eight) hours as needed for nausea or vomiting. Patient not taking: Reported on 04/20/2019 07/30/18   Joy, Ines Bloomer C, PA-C  pantoprazole (PROTONIX) 20 MG tablet Take 1 tablet (20 mg total) by mouth daily. 10/04/19   Bethann Berkshire, MD     Allergies    Dilaudid [hydromorphone hcl]  Review of Systems   Review of Systems  Constitutional: Negative for appetite change and fatigue.  HENT: Negative for congestion, ear discharge and sinus pressure.   Eyes: Negative for discharge.  Respiratory: Negative for cough.   Cardiovascular: Negative for chest pain.  Gastrointestinal: Positive for abdominal pain. Negative for diarrhea.  Genitourinary: Negative for frequency and hematuria.  Musculoskeletal: Negative for back pain.  Skin: Negative for rash.  Neurological: Negative for seizures and headaches.  Psychiatric/Behavioral: Negative for hallucinations.    Physical Exam Updated Vital Signs BP 121/86   Pulse 60   Temp 97.9 F (36.6 C) (Oral)   Resp 16   SpO2 99%   Physical Exam Vitals and nursing note reviewed.  Constitutional:      Appearance: She is well-developed.  HENT:     Head: Normocephalic.     Mouth/Throat:     Mouth: Mucous membranes are moist.  Eyes:     General: No scleral icterus.    Conjunctiva/sclera: Conjunctivae normal.  Neck:     Thyroid: No thyromegaly.  Cardiovascular:     Rate and Rhythm: Normal rate and regular rhythm.     Heart sounds: No murmur. No friction rub. No gallop.   Pulmonary:     Breath sounds: No stridor. No wheezing or rales.  Chest:     Chest wall: No tenderness.  Abdominal:     General: There is no distension.     Tenderness: There is abdominal tenderness. There is no rebound.  Musculoskeletal:        General: Normal range of motion.     Cervical back: Neck supple.  Lymphadenopathy:     Cervical: No cervical adenopathy.  Skin:    Findings: No erythema or rash.  Neurological:     Mental Status: She is alert and oriented to person, place, and time.     Motor: No abnormal muscle tone.     Coordination: Coordination normal.  Psychiatric:        Behavior: Behavior normal.     ED Results / Procedures / Treatments   Labs (all labs ordered are listed, but only  abnormal results are displayed) Labs Reviewed  COMPREHENSIVE METABOLIC PANEL - Abnormal; Notable for the following components:      Result Value   Calcium 8.6 (*)    Albumin 3.4 (*)    All other components within normal limits  URINALYSIS, ROUTINE W REFLEX MICROSCOPIC - Abnormal; Notable for the following components:   APPearance HAZY (*)    Leukocytes,Ua TRACE (*)    All other components within  normal limits  LIPASE, BLOOD  CBC  DIFFERENTIAL  I-STAT BETA HCG BLOOD, ED ([facility], [facility], [facility] ONLY)    EKG None  Radiology CT ABDOMEN PELVIS W CONTRAST  Result Date: 10/04/2019 CLINICAL DATA:  Left lower quadrant abdominal pain for 1 week EXAM: CT ABDOMEN AND PELVIS WITH CONTRAST TECHNIQUE: Multidetector CT imaging of the abdomen and pelvis was performed using the standard protocol following bolus administration of intravenous contrast. CONTRAST:  OMNIPAQUE IOHEXOL 300 MG/ML  SOLN COMPARISON:  04/09/2014 FINDINGS: Lower chest: No acute abnormality. Hepatobiliary: Single 4 mm gallstone is noted dependently within the gallbladder lumen. No CT evidence of pericholecystic inflammation. Liver is unremarkable. No focal liver lesion. No biliary dilatation. Pancreas: Unremarkable. No pancreatic ductal dilatation or surrounding inflammatory changes. Spleen: Normal in size without focal abnormality. Adrenals/Urinary Tract: Adrenal glands are unremarkable. Kidneys are normal, without renal calculi, focal lesion, or hydronephrosis. Bladder is unremarkable. Stomach/Bowel: Stomach is within normal limits. Appendix appears normal (series 2, image 74). No evidence of bowel wall thickening, distention, or inflammatory changes. Vascular/Lymphatic: No significant vascular findings. Previously seen right lower quadrant mesenteric adenopathy has largely resolved. No abdominopelvic lymphadenopathy. Reproductive: Uterus and bilateral adnexa are unremarkable. Other: No abdominal wall hernia or abnormality. No abdominopelvic  ascites. Musculoskeletal: No acute or significant osseous findings. IMPRESSION: 1. No acute abdominopelvic findings.  Normal appendix. 2. A single 4 mm gallstone is again noted within the gallbladder lumen. No evidence of cholecystitis by CT. 3. Previously seen right lower quadrant mesenteric adenopathy has largely resolved. Electronically Signed   By: Duanne Guess D.O.   On: 10/04/2019 13:21    Procedures Procedures (including critical care time)  Medications Ordered in ED Medications  sodium chloride flush (NS) 0.9 % injection 3 mL (3 mLs Intravenous Given 10/04/19 1033)  sodium chloride 0.9 % bolus 1,000 mL (0 mLs Intravenous Stopped 10/04/19 1323)  ondansetron (ZOFRAN) injection 4 mg (4 mg Intravenous Given 10/04/19 1033)  morphine 4 MG/ML injection 4 mg (4 mg Intravenous Given 10/04/19 1033)  pantoprazole (PROTONIX) injection 40 mg (40 mg Intravenous Given 10/04/19 1323)  iohexol (OMNIPAQUE) 300 MG/ML solution 100 mL (100 mLs Intravenous Contrast Given 10/04/19 1240)    ED Course  I have reviewed the triage vital signs and the nursing notes.  Pertinent labs & imaging results that were available during my care of the patient were reviewed by me and considered in my medical decision making (see chart for details).    MDM Rules/Calculators/A&P                      Patient with abdominal pain.  Suspect peptic ulcer problems she will be placed on Protonix and follow-up with her doctor.    This patient presents to the ED for concern of abdominal pain, this involves an extensive number of treatment options, and is a complaint that carries with it a high risk of complications and morbidity.  The differential diagnosis includes peptic ulcer disease, gastric tumor kidney stone   Lab Tests:   I Ordered, reviewed, and interpreted labs, which included CBC chemistries pregnancy test liver studies.  All were unremarkable.  Medicines ordered:   I ordered medication protonic IV fluids  morphine.  Patient improved with treatment  Imaging Studies ordered:   I ordered imaging studies which included CT abdomen and  I independently visualized and interpreted imaging which showed one small gallstone but no cholecystitis otherwise negative  Additional history obtained:   Additional history obtained from old records  Previous records obtained and reviewed     Reevaluation:  After the interventions stated above, I reevaluated the patient and found patient improved with pain and nausea medicine along with Protonix.  She will be sent home on Protonix follow-up her PCP  Critical Interventions:  .   Final Clinical Impression(s) / ED Diagnoses Final diagnoses:  Pain of upper abdomen    Rx / DC Orders ED Discharge Orders         Ordered    pantoprazole (PROTONIX) 20 MG tablet  Daily     10/04/19 1441    HYDROcodone-acetaminophen (NORCO/VICODIN) 5-325 MG tablet  Every 6 hours PRN     10/04/19 1441           Bethann Berkshire, MD 10/05/19 1046

## 2019-11-18 ENCOUNTER — Other Ambulatory Visit: Payer: Medicaid Other

## 2019-11-18 ENCOUNTER — Ambulatory Visit: Payer: Medicaid Other

## 2019-11-19 ENCOUNTER — Ambulatory Visit: Payer: Medicaid Other

## 2019-11-19 ENCOUNTER — Other Ambulatory Visit: Payer: Self-pay

## 2019-11-19 ENCOUNTER — Other Ambulatory Visit: Payer: Medicaid Other

## 2019-11-19 DIAGNOSIS — Z113 Encounter for screening for infections with a predominantly sexual mode of transmission: Secondary | ICD-10-CM

## 2019-11-19 DIAGNOSIS — B20 Human immunodeficiency virus [HIV] disease: Secondary | ICD-10-CM

## 2019-11-19 DIAGNOSIS — Z79899 Other long term (current) drug therapy: Secondary | ICD-10-CM | POA: Diagnosis not present

## 2019-11-19 MED ORDER — SYMTUZA 800-150-200-10 MG PO TABS: 1.0000 | ORAL_TABLET | Freq: Every day | ORAL | 0 refills | Status: DC

## 2019-11-20 ENCOUNTER — Telehealth: Payer: Self-pay | Admitting: *Deleted

## 2019-11-20 LAB — T-HELPER CELL (CD4) - (RCID CLINIC ONLY)
CD4 % Helper T Cell: 33 % (ref 33–65)
CD4 T Cell Abs: 410 /uL (ref 400–1790)

## 2019-11-20 NOTE — Telephone Encounter (Signed)
Patient called [facility], stating [facility] did not have her medication.  This was sent electronically 5/27 by triage, with confirmation of receipt. RN called [facility], confirmed they DO have the script, offered to connect patient with a conference call so they could set up delivery as she needs it.  Connected patient and pharmacy in 3-way call, was placed on 15+ minute hold with [facility] and patient needed to disconnect. Andree Coss, RN

## 2019-11-21 LAB — COMPREHENSIVE METABOLIC PANEL
AG Ratio: 1.1 (calc) (ref 1.0–2.5)
ALT: 11 U/L (ref 6–29)
AST: 13 U/L (ref 10–30)
Albumin: 3.8 g/dL (ref 3.6–5.1)
Alkaline phosphatase (APISO): 120 U/L (ref 31–125)
BUN: 11 mg/dL (ref 7–25)
CO2: 25 mmol/L (ref 20–32)
Calcium: 9.2 mg/dL (ref 8.6–10.2)
Chloride: 106 mmol/L (ref 98–110)
Creat: 0.8 mg/dL (ref 0.50–1.10)
Globulin: 3.4 g/dL (calc) (ref 1.9–3.7)
Glucose, Bld: 86 mg/dL (ref 65–99)
Potassium: 4.2 mmol/L (ref 3.5–5.3)
Sodium: 142 mmol/L (ref 135–146)
Total Bilirubin: 0.3 mg/dL (ref 0.2–1.2)
Total Protein: 7.2 g/dL (ref 6.1–8.1)

## 2019-11-21 LAB — LIPID PANEL
Cholesterol: 174 mg/dL (ref <200)
HDL: 49 mg/dL — ABNORMAL LOW (ref >=50)
LDL Cholesterol (Calc): 106 mg/dL (calc) — ABNORMAL HIGH
Non-HDL Cholesterol (Calc): 125 mg/dL (calc) (ref <130)
Total CHOL/HDL Ratio: 3.6 (calc) (ref <5.0)
Triglycerides: 97 mg/dL (ref <150)

## 2019-11-21 LAB — CBC
HCT: 35.7 % (ref 35.0–45.0)
Hemoglobin: 11.9 g/dL (ref 11.7–15.5)
MCH: 29.2 pg (ref 27.0–33.0)
MCHC: 33.3 g/dL (ref 32.0–36.0)
MCV: 87.5 fL (ref 80.0–100.0)
MPV: 11.1 fL (ref 7.5–12.5)
Platelets: 315 10*3/uL (ref 140–400)
RBC: 4.08 10*6/uL (ref 3.80–5.10)
RDW: 12.1 % (ref 11.0–15.0)
WBC: 5.1 10*3/uL (ref 3.8–10.8)

## 2019-11-21 LAB — HIV-1 RNA QUANT-NO REFLEX-BLD
HIV 1 RNA Quant: 22 copies/mL — ABNORMAL HIGH
HIV-1 RNA Quant, Log: 1.34 Log copies/mL — ABNORMAL HIGH

## 2019-11-21 LAB — RPR: RPR Ser Ql: NONREACTIVE

## 2019-11-30 ENCOUNTER — Other Ambulatory Visit: Payer: Self-pay | Admitting: Infectious Diseases

## 2019-11-30 DIAGNOSIS — B20 Human immunodeficiency virus [HIV] disease: Secondary | ICD-10-CM

## 2019-12-03 ENCOUNTER — Ambulatory Visit: Payer: Medicaid Other | Admitting: Infectious Diseases

## 2019-12-10 ENCOUNTER — Ambulatory Visit (INDEPENDENT_AMBULATORY_CARE_PROVIDER_SITE_OTHER): Payer: Medicaid Other | Admitting: Infectious Diseases

## 2019-12-10 ENCOUNTER — Other Ambulatory Visit: Payer: Self-pay

## 2019-12-10 ENCOUNTER — Encounter: Payer: Self-pay | Admitting: Infectious Diseases

## 2019-12-10 VITALS — Wt 324.0 lb

## 2019-12-10 DIAGNOSIS — B20 Human immunodeficiency virus [HIV] disease: Secondary | ICD-10-CM | POA: Diagnosis not present

## 2019-12-10 DIAGNOSIS — Z6841 Body Mass Index (BMI) 40.0 and over, adult: Secondary | ICD-10-CM | POA: Diagnosis not present

## 2019-12-10 DIAGNOSIS — F329 Major depressive disorder, single episode, unspecified: Secondary | ICD-10-CM

## 2019-12-10 DIAGNOSIS — M85642 Other cyst of bone, left hand: Secondary | ICD-10-CM | POA: Insufficient documentation

## 2019-12-10 DIAGNOSIS — Z79899 Other long term (current) drug therapy: Secondary | ICD-10-CM

## 2019-12-10 DIAGNOSIS — Z113 Encounter for screening for infections with a predominantly sexual mode of transmission: Secondary | ICD-10-CM | POA: Diagnosis not present

## 2019-12-10 DIAGNOSIS — N644 Mastodynia: Secondary | ICD-10-CM | POA: Diagnosis not present

## 2019-12-10 DIAGNOSIS — Z23 Encounter for immunization: Secondary | ICD-10-CM | POA: Diagnosis not present

## 2019-12-10 DIAGNOSIS — F32A Depression, unspecified: Secondary | ICD-10-CM

## 2019-12-10 NOTE — Assessment & Plan Note (Signed)
Will refer her to obesity, wt loss mgmt clinic

## 2019-12-10 NOTE — Addendum Note (Signed)
Addended by: Valarie Cones on: 12/10/2019 03:34 PM   Modules accepted: Orders

## 2019-12-10 NOTE — Assessment & Plan Note (Signed)
She denies this but has some sx.  Offered counseling.

## 2019-12-10 NOTE — Addendum Note (Signed)
Addended by: Tressa Busman T on: 12/10/2019 03:26 PM   Modules accepted: Orders

## 2019-12-10 NOTE — Progress Notes (Signed)
   Subjective:    Patient ID: Brenda Tyler, female    DOB: 06-Oct-1985, 34 y.o.   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 Previously started on bactrim and stribild---> symtuza. She has had periods of intermittent adherence.Mostly misses on weekends.  Her last PAP was 04-2018 (normal). C/o fatigue, lethargy today. Has gained 50# since 2018. Occas exercise, walks in the park. "Have been eating a lot".  Worried about how to lose wt.  Has anhedonia. Denies depression, tearfullness.  Bone aches.  Works every day.  Feet sweating.  Amenorrhea for several years until May 7th then she had regular menses. None since.  C/o breast pain.  Nl PAP 07-30-19  HIV 1 RNA Quant (copies/mL)  Date Value  11/19/2019 22 (H)  04/03/2019 31 (H)  06/30/2018 79 (H)   CD4 T Cell Abs (/uL)  Date Value  11/19/2019 410  04/03/2019 437  06/30/2018 400    Review of Systems  Constitutional: Positive for appetite change, fatigue and unexpected weight change.  Gastrointestinal: Negative for constipation and diarrhea.  Genitourinary: Positive for menstrual problem and vaginal bleeding. Negative for difficulty urinating.  Musculoskeletal: Positive for arthralgias.  Please see HPI. All other systems reviewed and negative.     Objective:   Physical Exam Vitals reviewed.  Constitutional:      Appearance: Normal appearance. She is obese.  HENT:     Mouth/Throat:     Mouth: Mucous membranes are moist.     Pharynx: No oropharyngeal exudate.  Eyes:     Extraocular Movements: Extraocular movements intact.     Pupils: Pupils are equal, round, and reactive to light.  Cardiovascular:     Rate and Rhythm: Normal rate and regular rhythm.  Pulmonary:     Effort: Pulmonary effort is normal.     Breath sounds: Normal breath sounds.  Abdominal:     General: Bowel sounds are normal. There is no distension.     Palpations: Abdomen is soft.     Tenderness: There is no  abdominal tenderness.  Musculoskeletal:       Arms:     Cervical back: Normal range of motion and neck supple.  Neurological:     Mental Status: She is alert.           Assessment & Plan:

## 2019-12-10 NOTE — Assessment & Plan Note (Signed)
Has partner who is aware. He does not want to wear condoms. Offer prep for partner.   Needs HPV #2 She has not had COVID vax. encouraged Will see her back in 9 months.  Labs prior.

## 2019-12-10 NOTE — Assessment & Plan Note (Signed)
Have her eval by hand surgery.

## 2019-12-10 NOTE — Addendum Note (Signed)
Addended by: Tressa Busman T on: 12/10/2019 03:25 PM   Modules accepted: Orders

## 2019-12-23 ENCOUNTER — Ambulatory Visit (INDEPENDENT_AMBULATORY_CARE_PROVIDER_SITE_OTHER): Payer: Self-pay | Admitting: Bariatrics

## 2020-01-05 ENCOUNTER — Encounter (HOSPITAL_COMMUNITY): Payer: Self-pay

## 2020-01-05 ENCOUNTER — Emergency Department (HOSPITAL_COMMUNITY): Payer: BC Managed Care – PPO

## 2020-01-05 ENCOUNTER — Other Ambulatory Visit: Payer: Self-pay

## 2020-01-05 ENCOUNTER — Emergency Department (HOSPITAL_COMMUNITY)
Admission: EM | Admit: 2020-01-05 | Discharge: 2020-01-05 | Disposition: A | Discharge status: Home / Self Care | Payer: BC Managed Care – PPO | Attending: Emergency Medicine | Admitting: Emergency Medicine

## 2020-01-05 DIAGNOSIS — J069 Acute upper respiratory infection, unspecified: Secondary | ICD-10-CM | POA: Diagnosis not present

## 2020-01-05 DIAGNOSIS — R05 Cough: Secondary | ICD-10-CM | POA: Diagnosis not present

## 2020-01-05 DIAGNOSIS — B9789 Other viral agents as the cause of diseases classified elsewhere: Secondary | ICD-10-CM | POA: Diagnosis not present

## 2020-01-05 DIAGNOSIS — I10 Essential (primary) hypertension: Secondary | ICD-10-CM | POA: Diagnosis not present

## 2020-01-05 DIAGNOSIS — Z20822 Contact with and (suspected) exposure to covid-19: Secondary | ICD-10-CM | POA: Diagnosis not present

## 2020-01-05 LAB — COMPREHENSIVE METABOLIC PANEL
ALT: 19 U/L (ref 0–44)
AST: 26 U/L (ref 15–41)
Albumin: 3.7 g/dL (ref 3.5–5.0)
Alkaline Phosphatase: 95 U/L (ref 38–126)
Anion gap: 7 (ref 5–15)
BUN: 8 mg/dL (ref 6–20)
CO2: 25 mmol/L (ref 22–32)
Calcium: 8.5 mg/dL — ABNORMAL LOW (ref 8.9–10.3)
Chloride: 103 mmol/L (ref 98–111)
Creatinine, Ser: 0.65 mg/dL (ref 0.44–1.00)
GFR calc Af Amer: >60 mL/min (ref >60)
GFR calc non Af Amer: >60 mL/min (ref >60)
Glucose, Bld: 97 mg/dL (ref 70–99)
Potassium: 3.7 mmol/L (ref 3.5–5.1)
Sodium: 135 mmol/L (ref 135–145)
Total Bilirubin: 0.8 mg/dL (ref 0.3–1.2)
Total Protein: 8 g/dL (ref 6.5–8.1)

## 2020-01-05 LAB — CBC WITH DIFFERENTIAL/PLATELET
Abs Immature Granulocytes: 0.01 10*3/uL (ref 0.00–0.07)
Basophils Absolute: 0 10*3/uL (ref 0.0–0.1)
Basophils Relative: 1 %
Eosinophils Absolute: 0.1 10*3/uL (ref 0.0–0.5)
Eosinophils Relative: 3 %
HCT: 39.7 % (ref 36.0–46.0)
Hemoglobin: 13.1 g/dL (ref 12.0–15.0)
Immature Granulocytes: 0 %
Lymphocytes Relative: 34 %
Lymphs Abs: 1.4 10*3/uL (ref 0.7–4.0)
MCH: 29.8 pg (ref 26.0–34.0)
MCHC: 33 g/dL (ref 30.0–36.0)
MCV: 90.4 fL (ref 80.0–100.0)
Monocytes Absolute: 0.5 10*3/uL (ref 0.1–1.0)
Monocytes Relative: 11 %
Neutro Abs: 2 10*3/uL (ref 1.7–7.7)
Neutrophils Relative %: 51 %
Platelets: 290 10*3/uL (ref 150–400)
RBC: 4.39 MIL/uL (ref 3.87–5.11)
RDW: 13 % (ref 11.5–15.5)
WBC: 4 10*3/uL (ref 4.0–10.5)
nRBC: 0 % (ref 0.0–0.2)

## 2020-01-05 LAB — SARS CORONAVIRUS 2 BY RT PCR (HOSPITAL ORDER, PERFORMED IN ~~LOC~~ HOSPITAL LAB): SARS Coronavirus 2: NEGATIVE

## 2020-01-05 MED ORDER — BENZONATATE 100 MG PO CAPS
100.0000 mg | ORAL_CAPSULE | Freq: Three times a day (TID) | ORAL | 0 refills | Status: DC
Start: 2020-01-05 — End: 2020-03-09

## 2020-01-05 NOTE — ED Provider Notes (Signed)
Given that her episodes of [facility]-EMERGENCY DEPT Provider Note   CSN: [id number] Arrival date & time: 01/05/20  0957     History Chief Complaint  Patient presents with  . Cough  . Fatigue  . Emesis    SHAKALA MARLATT is a 34 y.o. female past medical history of HIV, HSV, and HLD presents to the ED with a 10-day history of cough and fatigue.  Patient states that she has tried all the conservative remedies including warm tea with honey in addition to over-the-counter medications such as Mucinex and NyQuil.  She denies any fevers or chills, but states that she has been coughing so vigorously has vomited on multiple occasions and does not feel as though she has kept much food down during duration of illness.  She denies any associated nausea symptoms.  She also denies any headache or dizziness, sore throat, chest pain, pleuritic symptoms, unilateral extremity swelling, abdominal pain, hematemesis, urinary symptoms, pelvic pain/discharge, or changes in bowel habits.  Patient has not been vaccinated for COVID-19 despite encouragement from her infectious disease doctor.  She reports that her son is also now beginning to exhibit similar symptoms.     HPI     Past Medical History:  Diagnosis Date  . 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 HIV (human immunodeficiency virus infection) ([facility])   . HSV (herpes simplex virus) infection   . Hypertension   . Obesity     Patient Active Problem List   Diagnosis Date Noted  . Cyst of bone of left hand 12/10/2019  . Visit for routine gyn exam 07/30/2019  . Desire for pregnancy 07/30/2019  . Dislocation of knee, anterior, left, closed 01/21/2016  . Closed anterior dislocation of left knee 01/21/2016  . Depression 07/26/2014  . Oropharyngeal candidiasis 07/05/2014  . 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 Nausea without vomiting 07/02/2014  .  HIV disease ([facility]) 04/23/2014  . General counseling for prescription of oral contraceptives 06/11/2013  . 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 Obesity 12/22/2012    Past Surgical History:  Procedure Laterality Date  . INCISION AND DRAINAGE ABSCESS     on abdomen and buttocks     OB History    Gravida  4   Para  3   Term  3   Preterm      AB  1   Living  3     SAB      TAB  1   Ectopic      Multiple      Live Births              Family History  Problem Relation Age of Onset  . Heart disease Mother   . Hypertension Maternal Grandmother   . Diabetes Maternal Grandmother   . Heart disease Maternal Grandmother   . Cancer Maternal Grandmother   . Hypertension Paternal Grandmother   . Diabetes Paternal Grandmother   . Heart disease Paternal Grandmother     Social History   Tobacco Use  . Smoking status: Never Smoker  . Smokeless tobacco: Never Used  Vaping Use  . Vaping Use: Never used  Substance Use Topics  . Alcohol use: Yes    Alcohol/week: 2.0 standard drinks    Types: 2 Standard drinks or equivalent per week    Comment: occasional  . Drug use:  Not Currently    Home Medications Prior to Admission medications   Medication Sig Start Date End Date Taking? Authorizing Provider  albuterol (PROVENTIL HFA;VENTOLIN HFA) 108 (90 Base) MCG/ACT inhaler Inhale 2 puffs into the lungs every 4 (four) hours as needed for wheezing or shortness of breath. 07/30/18   Joy, Shawn C, PA-C  benzonatate (TESSALON) 100 MG capsule Take 1 capsule (100 mg total) by mouth every 8 (eight) hours. 01/05/20   Lorelee New, PA-C  Darunavir-Cobicisctat-Emtricitabine-Tenofovir Alafenamide (SYMTUZA) 800-150-200-10 MG TABS Take 1 tablet by mouth daily with breakfast. 11/19/19   Ginnie Smart, MD  FLUoxetine (PROZAC) 10 MG capsule Take 1 capsule (10 mg total) by mouth daily. 04/20/19   Ginnie Smart, MD  HYDROcodone-acetaminophen (NORCO/VICODIN) 5-325 MG tablet Take 1 tablet  by mouth every 6 (six) hours as needed for moderate pain. 10/04/19   Bethann Berkshire, MD  ondansetron (ZOFRAN ODT) 4 MG disintegrating tablet Take 1 tablet (4 mg total) by mouth every 8 (eight) hours as needed for nausea or vomiting. 07/30/18   Joy, Shawn C, PA-C  pantoprazole (PROTONIX) 20 MG tablet Take 1 tablet (20 mg total) by mouth daily. 10/04/19   Bethann Berkshire, MD    Allergies    Dilaudid [hydromorphone hcl]  Review of Systems   Review of Systems  All other systems reviewed and are negative.   Physical Exam Updated Vital Signs BP 125/71 (BP Location: Right Arm)   Pulse 81   Temp 97.9 F (36.6 C) (Oral)   Resp 16   Ht 5\' 5"  (1.651 m)   Wt (!) 145.2 kg   SpO2 97%   BMI 53.25 kg/m   Physical Exam Vitals and nursing note reviewed. Exam conducted with a chaperone present.  Constitutional:      Appearance: She is ill-appearing.     Comments: Significant nonproductive coughing.  HENT:     Head: Normocephalic and atraumatic.     Nose: Congestion present.     Mouth/Throat:     Pharynx: Oropharynx is clear. No oropharyngeal exudate or posterior oropharyngeal erythema.  Eyes:     General: No scleral icterus.    Conjunctiva/sclera: Conjunctivae normal.  Cardiovascular:     Rate and Rhythm: Normal rate and regular rhythm.     Pulses: Normal pulses.     Heart sounds: Normal heart sounds.  Pulmonary:     Comments: No increased work of breathing.  Breath sounds intact bilaterally.  No obvious abnormal breath sounds.  No accessory muscle use or respiratory distress. Abdominal:     General: Abdomen is flat. There is no distension.     Palpations: Abdomen is soft.     Tenderness: There is no abdominal tenderness.  Musculoskeletal:     Right lower leg: No edema.     Left lower leg: No edema.  Skin:    General: Skin is dry.     Capillary Refill: Capillary refill takes less than 2 seconds.  Neurological:     Mental Status: She is alert and oriented to person, place, and time.      GCS: GCS eye subscore is 4. GCS verbal subscore is 5. GCS motor subscore is 6.  Psychiatric:        Mood and Affect: Mood normal.        Behavior: Behavior normal.        Thought Content: Thought content normal.     ED Results / Procedures / Treatments   Labs (all labs ordered are listed, but  only abnormal results are displayed) Labs Reviewed  COMPREHENSIVE METABOLIC PANEL - Abnormal; Notable for the following components:      Result Value   Calcium 8.5 (*)    All other components within normal limits  SARS CORONAVIRUS 2 BY RT PCR (HOSPITAL ORDER, PERFORMED IN [facility])  CBC WITH DIFFERENTIAL/PLATELET    EKG None  Radiology DG Chest Portable 1 View  Result Date: 01/05/2020 CLINICAL DATA:  Cough for 1 and half weeks. EXAM: PORTABLE CHEST 1 VIEW COMPARISON:  07/30/2018 FINDINGS: The cardiac silhouette, mediastinal and hilar contours are within normal limits given the AP projection and portable technique. The lungs are clear. No pleural effusions or pulmonary lesions. The bony thorax is intact. IMPRESSION: No acute cardiopulmonary findings. Electronically Signed   By: Rudie Meyer M.D.   On: 01/05/2020 10:44    Procedures Procedures (including critical care time)  Medications Ordered in ED Medications - No data to display  ED Course  I have reviewed the triage vital signs and the nursing notes.  Pertinent labs & imaging results that were available during my care of the patient were reviewed by me and considered in my medical decision making (see chart for details).    MDM Rules/Calculators/A&P                          Patient's history and physical exam is consistent with upper respiratory infection, likely viral etiology x 10 days.  I reviewed patient's medical record and most recent CD4 count and % Helper T Cell in labs obtained 11/19/2019 were within normal limits.    Patient's CXR is negative for acute infiltrate.  Symptoms are likely of viral  etiology.  Discussed that antibiotics are not indicated for viral infections.  Patient will be discharged with symptomatic treatment.  Patient's laboratory work-up is entirely unremarkable.  She has not had any episodes of emesis here in the ED.  I emphasized the importance of rest, continued oral hydration, and antipyretics as needed for fever control.  I will prescribe Tessalon Perles antitussives and encourage her to maintain isolation precautions while COVID-19 test is pending.  Given that her episodes of emesis are subsequent to vigorous coughing and not related to nausea, do not feel as though Zofran ODT would provide any relief.  They were provided opportunity to ask any additional questions and have none at this time.  Prior to discharge patient is feeling well, agreeable with plan for discharge home.  They have expressed understanding of verbal discharge instructions as well as return precautions and are agreeable to the plan.    Final Clinical Impression(s) / ED Diagnoses Final diagnoses:  Viral upper respiratory tract infection    Rx / DC Orders ED Discharge Orders         Ordered    benzonatate (TESSALON) 100 MG capsule  Every 8 hours     Discontinue  Reprint     01/05/20 1145           Elvera Maria 01/05/20 1146    Gwyneth Sprout, MD 01/05/20 1616

## 2020-01-05 NOTE — Discharge Instructions (Signed)
Please take the [facility], as directed.  This will help with your vigorous cough symptoms.  Continue to drink plenty of fluids and eat small meals.  Continue to check your temperature regularly to assess for fevers.  Use antipyretics such as Tylenol or ibuprofen as needed for fever control.  Please continue to maintain isolation precautions pending results of your COVID-19 testing.  Your laboratory work-up and imaging today here was entirely reassuring.  Please follow-up with your primary care provider regarding today's encounter.  Return to the ED or seek immediate medical attention should you experience any new or worsening symptoms.

## 2020-01-05 NOTE — ED Triage Notes (Addendum)
Patient states she has been coughing, and fatigue x 1 1/2 weeks. Patient states she has been taking OTC meds like Nyquil, Mucinex, etc. Cough is worse at night and coughs so hard and frequent which causes her to vomit.

## 2020-01-06 ENCOUNTER — Ambulatory Visit (INDEPENDENT_AMBULATORY_CARE_PROVIDER_SITE_OTHER): Payer: Self-pay | Admitting: Bariatrics

## 2020-01-13 ENCOUNTER — Other Ambulatory Visit: Payer: Self-pay | Admitting: Infectious Diseases

## 2020-01-13 DIAGNOSIS — B20 Human immunodeficiency virus [HIV] disease: Secondary | ICD-10-CM

## 2020-02-10 DIAGNOSIS — R2232 Localized swelling, mass and lump, left upper limb: Secondary | ICD-10-CM | POA: Diagnosis not present

## 2020-02-23 ENCOUNTER — Other Ambulatory Visit: Payer: Self-pay

## 2020-02-23 ENCOUNTER — Telehealth: Payer: BC Managed Care – PPO | Admitting: Physician Assistant

## 2020-02-23 DIAGNOSIS — Z20822 Contact with and (suspected) exposure to covid-19: Secondary | ICD-10-CM | POA: Diagnosis not present

## 2020-02-23 DIAGNOSIS — R42 Dizziness and giddiness: Secondary | ICD-10-CM | POA: Diagnosis not present

## 2020-02-23 DIAGNOSIS — B20 Human immunodeficiency virus [HIV] disease: Secondary | ICD-10-CM | POA: Diagnosis not present

## 2020-02-23 DIAGNOSIS — R519 Headache, unspecified: Secondary | ICD-10-CM

## 2020-02-23 LAB — POC COVID19 BINAXNOW: SARS Coronavirus 2 Ag: NEGATIVE

## 2020-02-23 NOTE — Progress Notes (Signed)
New Patient Office Visit  Subjective:  Patient ID: Brenda Tyler, female    DOB: 05/16/1986  Age: 34 y.o. MRN: [id number]  CC:  Chief Complaint  Patient presents with  . Covid Exposure     Virtual Visit via Telephone Note  I connected with [name] on 02/23/20 at  5:05 PM EDT by telephone and verified that I am speaking with the correct person using two identifiers.  Location: Patient: Home Provider: [facility] Medicine Unit    I discussed the limitations, risks, security and privacy concerns of performing an evaluation and management service by telephone and the availability of in person appointments. I also discussed with the patient that there may be a patient responsible charge related to this service. The patient expressed understanding and agreed to proceed.   History of Present Illness: States that she has been feeling dizzy, heart racing, headaches for the past week.   Has not tried anything for relief  Has not had Covid vaccine, is HIV positive  States that her roommate tested positive for Covid last week, was sent to hospital today with Covid pneumonia.  All members of her family who live in the home have similar symptoms.   Observations/Objective:    Medical history and current medications reviewed, no physical exam completed          Past Medical History:  Diagnosis Date  . 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 HIV (human immunodeficiency virus infection) (HCC)   . HSV (herpes simplex virus) infection   . Hypertension   . Obesity     Past Surgical History:  Procedure Laterality Date  . INCISION AND DRAINAGE ABSCESS     on abdomen and buttocks    Family History  Problem Relation Age of Onset  . Heart disease Mother   . Hypertension Maternal Grandmother   . Diabetes Maternal Grandmother   . Heart disease Maternal Grandmother   .  Cancer Maternal Grandmother   . Hypertension Paternal Grandmother   . Diabetes Paternal Grandmother   . Heart disease Paternal Grandmother     Social History   Socioeconomic History  . Marital status: Single    Spouse name: Not on file  . Number of children: Not on file  . Years of education: Not on file  . Highest education level: Not on file  Occupational History  . Not on file  Tobacco Use  . Smoking status: Never Smoker  . Smokeless tobacco: Never Used  Vaping Use  . Vaping Use: Never used  Substance and Sexual Activity  . Alcohol use: Yes    Alcohol/week: 2.0 standard drinks    Types: 2 Standard drinks or equivalent per week    Comment: occasional  . Drug use: Not Currently  . Sexual activity: Yes    Birth control/protection: None    Comment: pt. given condoms  Other Topics Concern  . Not on file  Social History Narrative  . Not on file   Social Determinants of Health   Financial Resource Strain:   . Difficulty of Paying Living Expenses: Not on file  Food Insecurity:   . Worried About Programme researcher, broadcasting/film/video in the Last Year: Not on file  . Ran Out of Food in the Last Year: Not on file  Transportation Needs:   . Lack of Transportation (Medical):  Not on file  . Lack of Transportation (Non-Medical): Not on file  Physical Activity:   . Days of Exercise per Week: Not on file  . Minutes of Exercise per Session: Not on file  Stress:   . Feeling of Stress : Not on file  Social Connections:   . Frequency of Communication with Friends and Family: Not on file  . Frequency of Social Gatherings with Friends and Family: Not on file  . Attends Religious Services: Not on file  . Active Member of Clubs or Organizations: Not on file  . Attends Banker Meetings: Not on file  . Marital Status: Not on file  Intimate Partner Violence:   . Fear of Current or Ex-Partner: Not on file  . Emotionally Abused: Not on file  . Physically Abused: Not on file  . Sexually  Abused: Not on file    ROS Review of Systems  Constitutional: Negative for chills and fever.  HENT: Negative.   Respiratory: Negative for cough, shortness of breath and wheezing.   Cardiovascular: Positive for palpitations.  Gastrointestinal: Negative for nausea and vomiting.  Endocrine: Negative.   Genitourinary: Negative.   Musculoskeletal: Negative.   Skin: Negative.   Allergic/Immunologic: Positive for immunocompromised state.  Neurological: Positive for dizziness and headaches.  Hematological: Negative.   Psychiatric/Behavioral: Negative.     Objective:   Today's Vitals: There were no vitals taken for this visit.    Assessment & Plan:   Problem List Items Addressed This Visit      Other   HIV disease (HCC) (Chronic)   Acute intractable headache   Dizziness and giddiness    Other Visit Diagnoses    Encounter by telehealth for suspected COVID-19    -  Primary   Relevant Orders   Novel Coronavirus, NAA ([facility])   POC COVID-19 (Completed)   Close exposure to COVID-19 virus          Outpatient Encounter Medications as of 02/23/2020  Medication Sig  . albuterol (PROVENTIL HFA;VENTOLIN HFA) 108 (90 Base) MCG/ACT inhaler Inhale 2 puffs into the lungs every 4 (four) hours as needed for wheezing or shortness of breath.  . benzonatate (TESSALON) 100 MG capsule Take 1 capsule (100 mg total) by mouth every 8 (eight) hours.  Marland Kitchen FLUoxetine (PROZAC) 10 MG capsule Take 1 capsule (10 mg total) by mouth daily.  Marland Kitchen HYDROcodone-acetaminophen (NORCO/VICODIN) 5-325 MG tablet Take 1 tablet by mouth every 6 (six) hours as needed for moderate pain.  Marland Kitchen ondansetron (ZOFRAN ODT) 4 MG disintegrating tablet Take 1 tablet (4 mg total) by mouth every 8 (eight) hours as needed for nausea or vomiting.  . pantoprazole (PROTONIX) 20 MG tablet Take 1 tablet (20 mg total) by mouth daily.  . SYMTUZA 800-150-200-10 MG TABS TAKE 1 TABLET BY MOUTH DAILY WITH BREAKFAST.   No facility-administered  encounter medications on file as of 02/23/2020.   Assessment and Plan: 1. Encounter by telehealth for suspected COVID-19 Patient evaluated, tested and sent home with instructions for home care and Quarantine. Instructed to seek further care if symptoms worsen.    - Novel Coronavirus, NAA ([facility]) - POC COVID-19  2. Close exposure to COVID-19 virus   3. HIV disease [facility]) Patient education given on importance of Covid vaccine  4. Acute intractable headache, unspecified headache type   5. Dizziness and giddiness    Follow Up Instructions:    I discussed the assessment and treatment plan with the patient. The patient was provided an opportunity to  ask questions and all were answered. The patient agreed with the plan and demonstrated an understanding of the instructions.   The patient was advised to call back or seek an in-person evaluation if the symptoms worsen or if the condition fails to improve as anticipated.  I provided 21 minutes of non-face-to-face time during this encounter.   Classie Weng S Mayers, PA-C   Follow-up: Return if symptoms worsen or fail to improve.   Kasandra Knudsen Mayers, PA-C

## 2020-02-23 NOTE — Patient Instructions (Addendum)
I encourage you to continue with rest, proper hydration and nutrition, over-the-counter Tylenol or ibuprofen to help with headaches.  Due to your close exposure to COVID-19, it is recommended that you quarantine for 14 days from the last date of exposure.  We will call you with the results of your send off Covid testing.  if your test results are negative, please feel free to return to the mobile unit next week if your symptoms do not improve.  I hope that you feel better soon  Roney Jaffe, PA-C Physician Assistant [facility] Medicine [email or web address]  COVID-19 Vaccine Information can be found at: [email or web address] For questions related to vaccine distribution or appointments, please email [email or web address] or call [phone].    COVID-19: Quarantine vs. Isolation QUARANTINE keeps someone who was in close contact with someone who has COVID-19 away from others. If you had close contact with a person who has COVID-19  Stay home until 14 days after your last contact.  Check your temperature twice a day and watch for symptoms of COVID-19.  If possible, stay away from people who are at higher-risk for getting very sick from COVID-19. ISOLATION keeps someone who is sick or tested positive for COVID-19 without symptoms away from others, even in their own home. If you are sick and think or know you have COVID-19  Stay home until after ? At least 10 days since symptoms first appeared and ? At least 24 hours with no fever without fever-reducing medication and ? Symptoms have improved If you tested positive for COVID-19 but do not have symptoms  Stay home until after ? 10 days have passed since your positive test If you live with others, stay in a specific "sick room" or area and away from other people or animals, including pets. Use a separate bathroom, if  available. [employer]Flowers.co.uk 01/12/2019 This information is not intended to replace advice given to you by your health care provider. Make sure you discuss any questions you have with your health care provider. Document Revised: 05/28/2019 Document Reviewed: 05/28/2019 Elsevier Patient Education  2020 Elsevier Inc.   Infection Prevention in the Home If you have an infection, may have been exposed to an infection, or are taking care of someone who has an infection, it is important to know how to keep the infection from spreading. Follow your health care provider's instructions and use these guidelines to help stop the spread of infection. How infections are spread In order for an infection to spread, the following must be present:  A germ. This may be a virus, bacteria, fungus, or parasite.  A place for the germ to live. This may be: ? On or in a person, animal, plant, or food. ? In soil or water. ? On surfaces, such as a door handle.  A person or animal who can develop a disease if the germ enters the body (host). The host does not have resistance to the germ.  A way for the germ to enter the host. This may occur by: ? Direct contact with an infected person or animal. This can happen through shaking hands or hugging. Some germs can also travel through the air and spread to others. This can happen when an infected person coughs or sneezes on or near other people. ? Indirect contact. This occurs when the germ enters the host through contact with an infected object. Examples include:  Eating or drinking food or water that has the germ (is contaminated).  Touching a contaminated surface with  your hands, and then touching your face, eyes, nose, or mouth. Supplies needed:  Soap.  Alcohol-based hand sanitizer.  Standard cleaning products.  Disinfectants, such as bleach.  Reusable cleaning cloths, sponges, or paper towels.  Disposable or reusable utility gloves. How to prevent  infection from spreading There are several things that you can do to help prevent infection from spreading. Take these general actions Everyone should take the following actions to prevent the spread of infection:  Wash your hands often with soap and water for at least 20 seconds. If soap and water are not available, use alcohol-based hand sanitizer.  Avoid touching your face, mouth, nose, or eyes.  Cough or sneeze into a tissue, sleeve, or elbow instead of into your hand or into the air. ? If you cough or sneeze into a tissue, throw it away immediately and wash your hands.  Keep your bathroom clean  Provide soap.  Change towels and washcloths frequently.  Change toothbrushes often and store them separately in a clean, dry place.  Clean and disinfect all surfaces, including the toilet, floor, tub, shower, and sink.  Do not share personal items, such as razors, toothbrushes, deodorant, combs, brushes, towels, and washcloths. Maintain hygiene in the [facility] your hands before and after preparing food and before you eat.  Clean the inside of your refrigerator each week.  Keep your refrigerator set at 63F (4C) or less, and set your freezer at 88F (-18C) or less.  Keep work surfaces clean. Disinfect them regularly.  Wash your dishes in hot, soapy water. Air-dry your dishes or use a dishwasher.  Do not share dishes or eating utensils. Handle food safely  Store food carefully.  Refrigerate leftovers promptly in covered containers.  Throw out stale or spoiled food.  Thaw foods in the refrigerator or microwave, not at room temperature.  Serve foods at the proper temperature. Do not eat raw meat. Make sure it is cooked to the appropriate temperature. Cook eggs until they are firm.  Wash fruits and vegetables under running water.  Use separate cutting boards, plates, and utensils for raw foods and cooked foods.  Use a clean spoon each time you sample food while  cooking. Do laundry the right way  Wear gloves if laundry is visibly soiled.  Do not shake soiled laundry. Doing that may send germs into the air.  Wash laundry in hot water.  If you cannot wash the laundry right away, place it in a plastic bag and wash it as soon as possible. Be careful around animals and pets  Wash your hands before and after touching animals.  If you have a pet, ensure that your pet stays clean. Do not let people with weak immune systems touch bird droppings, fish tank water, or a litter box. ? If you have a pet cage or litter box, be sure to clean it every day.  If you are sick, stay away from animals and have someone else care for them if possible. How to clean and disinfect objects and surfaces Precautions  Some disinfectants work for certain germs and not others. Read the manufacturer's instructions or read online resources to determine if the product you are using will work for the germ you are trying to remove.  If you choose to use bleach, use it safely. Never mix it with other cleaning products, especially those that contain ammonia. This mixture can create a dangerous gas that may be deadly.  Keep proper movement of fresh air in  your home (ventilation).  Pour used mop water down the utility sink or toilet. Do not pour this water down the kitchen sink. Objects and surfaces   If surfaces are visibly soiled, clean them first with soap and water before disinfecting.  Disinfect surfaces that are frequently touched every day. This may include: ? Counters. ? Tables. ? Doorknobs. ? Sinks and faucets. ? Electronics, such as:  Engineer, technical sales.  Remote controls.  Keyboards.  Computers and tablets. Cleaning supplies Some cleaning supplies can breed germs. Take good care of them to prevent germs from spreading. To do this:  Soak toilet brushes, mops, and sponges in bleach and water for 5 minutes after each use, or according to manufacturer's  instructions.  Wash reusable cleaning cloths and sanitize sponges after each use.  Throw away disposable gloves after one use.  Replace reusable utility gloves if they are cracked or torn or if they start to peel. Additional actions if you are sick If you live with other people:   Avoid close contact with those around you. Stay at least 3 ft (1 m) away from others, if possible.  Use a separate bathroom, if possible.  If possible, sleep in a separate bedroom or in a separate bed to prevent infecting other household members. ? Change bedroom linens each week or whenever they are soiled.  Have everyone in the household wash hands often with soap and water. If soap and water are not available, use alcohol-based hand sanitizer. In general:  Stay home except to get medical care. Call ahead before visiting your health care provider.  Ask others to get groceries and household supplies and to refill prescriptions for you.  Avoid public areas. Try not to take public transportation.  If you can, wear a mask if you need to go out of the house, or if you are in close contact with someone who is not sick.  Avoid visitors until you have completely recovered, or until you have no signs and symptoms of infection.  Avoid preparing food or providing care for others. If you must prepare food or provide care for others, wear a mask and wash your hands before and after doing these things. Where to find more information  Centers for Disease Control and Prevention: WorkDashboard.es  World Health Organization [facility]): BeverageBargains.co.za  Association for Professionals in Infection Control and Epidemiology: professionals.site.DropOption.si Summary  It is important to know how to keep the infection from spreading.  Make sure everyone in your household washes their hands often with soap and  water.  Disinfect surfaces that are frequently touched every day.  If you are sick, stay home except to get medical care. This information is not intended to replace advice given to you by your health care provider. Make sure you discuss any questions you have with your health care provider. Document Released: 03/20/2008 Document Revised: 10/07/2018 Document Reviewed: 09/05/2018 Elsevier Patient Education  2020 [employer].

## 2020-02-25 ENCOUNTER — Telehealth: Payer: Self-pay | Admitting: *Deleted

## 2020-02-25 ENCOUNTER — Telehealth: Payer: Self-pay

## 2020-02-25 LAB — NOVEL CORONAVIRUS, NAA: SARS-CoV-2, NAA: NOT DETECTED

## 2020-02-25 NOTE — Telephone Encounter (Signed)
Spoke with patient about COVID vaccines and answered questions. Patient expressed significant concerns about safety with vaccine and whether or not she was healthy enough to get it. Reviewed labs with patient and explained the risks/benefits of being vaccinated. Patient scheduled for first dose of Pfizer on Tuesday (9/7) due to work schedule and not being able to get an appointment in the morning.   Charliegh Vasudevan Loyola Mast, RN

## 2020-02-25 NOTE — Telephone Encounter (Signed)
-----   Message from Roney Jaffe, [place] sent at 02/25/2020  8:47 AM EDT ----- PCR Covid testing was negative, due to her close contact, she should continue quarantine at this time

## 2020-02-25 NOTE — Telephone Encounter (Signed)
Patient verified DOB Patient is aware of PCR being negative but needing to adhere to precautions due to being symptomatic.

## 2020-03-01 ENCOUNTER — Ambulatory Visit: Payer: Medicaid Other

## 2020-03-02 ENCOUNTER — Ambulatory Visit: Payer: Medicaid Other

## 2020-03-09 ENCOUNTER — Encounter: Payer: BC Managed Care – PPO | Admitting: *Deleted

## 2020-03-09 ENCOUNTER — Other Ambulatory Visit: Payer: Self-pay

## 2020-03-09 NOTE — Progress Notes (Signed)
Patient has eaten today. Patient has not taken medication today. Patient still has intermittent HA No N/V/D  Patient denies runny nose, cough or congestion. Patient still has taste and smell.  Patient shared she was "busy" in the middle of the visit and disconnected. Provider attempted patient x3 with no response. Patient called back at 3pm while MA was on with other patients. MA contacted patient back at 3:20 with no answer x2.    This encounter was created in error - please disregard.

## 2020-03-14 NOTE — Progress Notes (Signed)
Patient never answered or returned call to complete visit.

## 2020-03-23 DIAGNOSIS — M25532 Pain in left wrist: Secondary | ICD-10-CM | POA: Diagnosis not present

## 2020-03-29 DIAGNOSIS — M67432 Ganglion, left wrist: Secondary | ICD-10-CM | POA: Diagnosis not present

## 2020-04-08 ENCOUNTER — Encounter (HOSPITAL_COMMUNITY): Payer: Self-pay | Admitting: Orthopaedic Surgery

## 2020-04-08 ENCOUNTER — Other Ambulatory Visit (HOSPITAL_COMMUNITY)
Admission: RE | Admit: 2020-04-08 | Discharge: 2020-04-08 | Disposition: A | Discharge status: Home / Self Care | Payer: BC Managed Care – PPO | Source: Ambulatory Visit | Attending: Orthopaedic Surgery | Admitting: Orthopaedic Surgery

## 2020-04-08 DIAGNOSIS — Z01812 Encounter for preprocedural laboratory examination: Secondary | ICD-10-CM | POA: Diagnosis not present

## 2020-04-08 DIAGNOSIS — U071 COVID-19: Secondary | ICD-10-CM | POA: Insufficient documentation

## 2020-04-08 LAB — SARS CORONAVIRUS 2 (TAT 6-24 HRS): SARS Coronavirus 2: POSITIVE — AB

## 2020-04-08 MED ORDER — DEXTROSE 5 % IV SOLN
3.0000 g | INTRAVENOUS | Status: AC
  Filled 2020-04-08: qty 3000

## 2020-04-09 ENCOUNTER — Other Ambulatory Visit: Payer: Self-pay | Admitting: Primary Care

## 2020-04-09 ENCOUNTER — Encounter (HOSPITAL_COMMUNITY): Payer: Self-pay | Admitting: Emergency Medicine

## 2020-04-09 ENCOUNTER — Other Ambulatory Visit: Payer: Self-pay

## 2020-04-09 ENCOUNTER — Emergency Department (HOSPITAL_COMMUNITY)
Admission: EM | Admit: 2020-04-09 | Discharge: 2020-04-09 | Disposition: A | Discharge status: Home / Self Care | Payer: BC Managed Care – PPO | Attending: Emergency Medicine | Admitting: Emergency Medicine

## 2020-04-09 DIAGNOSIS — U071 COVID-19: Secondary | ICD-10-CM

## 2020-04-09 DIAGNOSIS — R0789 Other chest pain: Secondary | ICD-10-CM | POA: Diagnosis not present

## 2020-04-09 DIAGNOSIS — J1282 Pneumonia due to coronavirus disease 2019: Secondary | ICD-10-CM | POA: Insufficient documentation

## 2020-04-09 DIAGNOSIS — I2510 Atherosclerotic heart disease of native coronary artery without angina pectoris: Secondary | ICD-10-CM | POA: Diagnosis not present

## 2020-04-09 DIAGNOSIS — Z01812 Encounter for preprocedural laboratory examination: Secondary | ICD-10-CM | POA: Diagnosis not present

## 2020-04-09 DIAGNOSIS — M791 Myalgia, unspecified site: Secondary | ICD-10-CM | POA: Diagnosis present

## 2020-04-09 MED ORDER — ACETAMINOPHEN 500 MG PO TABS
1000.0000 mg | ORAL_TABLET | Freq: Four times a day (QID) | ORAL | 2 refills | Status: DC | PRN
Start: 2020-04-09 — End: 2020-10-04

## 2020-04-09 MED ORDER — ACETAMINOPHEN 500 MG PO TABS
1000.0000 mg | ORAL_TABLET | Freq: Four times a day (QID) | ORAL | Status: DC | PRN
  Administered 2020-04-09: 1000 mg via ORAL
  Filled 2020-04-09: qty 2

## 2020-04-09 MED ORDER — IBUPROFEN 200 MG PO TABS: 600.0000 mg | ORAL_TABLET | Freq: Once | ORAL | Status: DC

## 2020-04-09 NOTE — Progress Notes (Signed)
Pt with positive COVID test result. Dr. Roney Mans notified. Will let pt know. No new orders received.

## 2020-04-09 NOTE — Progress Notes (Signed)
I connected by phone with Brenda Tyler on 04/09/2020 at 10:50 AM to discuss the potential use of a new treatment for mild to moderate COVID-19 viral infection in non-hospitalized patients.  This patient is a 34 y.o. female that meets the FDA criteria for Emergency Use Authorization of COVID monoclonal antibody casirivimab/imdevimab or bamlanivimab/eteseviamb.  Has a (+) direct SARS-CoV-2 viral test result  Has mild or moderate COVID-19   Is NOT hospitalized due to COVID-19  Is within 10 days of symptom onset  Has at least one of the high risk factor(s) for progression to severe COVID-19 and/or hospitalization as defined in EUA.  Specific high risk criteria : BMI > 25 and Immunosuppressive Disease or Treatment   I have spoken and communicated the following to the patient or parent/caregiver regarding COVID monoclonal antibody treatment:  1. FDA has authorized the emergency use for the treatment of mild to moderate COVID-19 in adults and pediatric patients with positive results of direct SARS-CoV-2 viral testing who are 4 years of age and older weighing at least 40 kg, and who are at high risk for progressing to severe COVID-19 and/or hospitalization.  2. The significant known and potential risks and benefits of COVID monoclonal antibody, and the extent to which such potential risks and benefits are unknown.  3. Information on available alternative treatments and the risks and benefits of those alternatives, including clinical trials.  4. Patients treated with COVID monoclonal antibody should continue to self-isolate and use infection control measures (e.g., wear mask, isolate, social distance, avoid sharing personal items, clean and disinfect high touch surfaces, and frequent handwashing) according to CDC guidelines.   5. The patient or parent/caregiver has the option to accept or refuse COVID monoclonal antibody treatment.  After reviewing this information with the patient, the  patient has agreed to receive one of the available covid 19 monoclonal antibodies and will be provided an appropriate fact sheet prior to infusion. Doreene Nest, NP 04/09/2020 10:50 AM

## 2020-04-09 NOTE — Discharge Instructions (Addendum)
Please return to the ED for evaluation if you have shortness of breath or trouble breathing, chest pain that does not improve with Tylenol, or if you are unable to drink fluids and keep them down in a 24-hour period.  You may use Tylenol for pain as needed every 6 hours. Be sure to hydrate well and quarantine at home for at least 10 days.  You will be contacted in regards to receiving a monoclonal antibody infusion. Expect a call within the next day to set this up.

## 2020-04-09 NOTE — ED Provider Notes (Signed)
[facility]-EMERGENCY DEPT Provider Note   CSN: [id number] Arrival date & time: 04/09/20  6962     History Chief Complaint  Patient presents with  . Generalized Body Aches  . Covid Positive    Brenda Tyler is a 34 y.o. female.  34 year old woman presenting today with positive Covid.  She began having symptoms on Wednesday, tested positive yesterday.  Today she presents to the ED because she feels generally bad: Myalgias, chills, sore throat, cough, some chest pain.  She has no unilateral leg swelling, heat, pain.  Past medical history significant for HIV, follows with our CAD, not sure of last CD4 count or VL. Unvaccinated. VSS in ED.         Past Medical History:  Diagnosis Date  . Anxiety   . GERD (gastroesophageal reflux disease)   . 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 HIV (human immunodeficiency virus infection) (HCC)   . HSV (herpes simplex virus) infection   . Obesity     Patient Active Problem List   Diagnosis Date Noted  . Acute intractable headache 02/23/2020  . Dizziness and giddiness 02/23/2020  . Cyst of bone of left hand 12/10/2019  . Visit for routine gyn exam 07/30/2019  . Desire for pregnancy 07/30/2019  . Dislocation of knee, anterior, left, closed 01/21/2016  . Closed anterior dislocation of left knee 01/21/2016  . Depression 07/26/2014  . Oropharyngeal candidiasis 07/05/2014  . 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 Nausea without vomiting 07/02/2014  . HIV disease (HCC) 04/23/2014  . General counseling for prescription of oral contraceptives 06/11/2013  . 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 Obesity 12/22/2012    Past Surgical History:  Procedure Laterality Date  . INCISION AND DRAINAGE ABSCESS     on abdomen and buttocks     OB History    Gravida  4   Para  3   Term  3   Preterm      AB  1   Living  3     SAB       TAB  1   Ectopic      Multiple      Live Births              Family History  Problem Relation Age of Onset  . Heart disease Mother   . Hypertension Maternal Grandmother   . Diabetes Maternal Grandmother   . Heart disease Maternal Grandmother   . Cancer Maternal Grandmother   . Hypertension Paternal Grandmother   . Diabetes Paternal Grandmother   . Heart disease Paternal Grandmother     Social History   Tobacco Use  . Smoking status: Never Smoker  . Smokeless tobacco: Never Used  Vaping Use  . Vaping Use: Never used  Substance Use Topics  . Alcohol use: Yes    Alcohol/week: 2.0 standard drinks    Types: 2 Standard drinks or equivalent per week    Comment: occasional  . Drug use: Not Currently    Home Medications Prior to Admission medications   Medication Sig Start Date End Date Taking? Authorizing Provider  acetaminophen (TYLENOL) 500 MG tablet Take 2 tablets (1,000 mg total) by mouth every 6 (six) hours as needed. 04/09/20 04/09/21  Shirlean Mylar, MD  albuterol (PROVENTIL HFA;VENTOLIN HFA) 108 (90 Base) MCG/ACT inhaler Inhale 2 puffs into the lungs every  4 (four) hours as needed for wheezing or shortness of breath. Patient not taking: Reported on 03/09/2020 07/30/18   Anselm Pancoast, PA-C  FLUoxetine (PROZAC) 10 MG capsule Take 1 capsule (10 mg total) by mouth daily. Patient not taking: Reported on 04/07/2020 04/20/19   Ginnie Smart, MD  Multiple Vitamins-Minerals (MULTIVITAMIN GUMMIES ADULT PO) Take 2 tablets by mouth daily.    [provider]  pantoprazole (PROTONIX) 20 MG tablet Take 1 tablet (20 mg total) by mouth daily. Patient not taking: Reported on 04/07/2020 10/04/19   Bethann Berkshire, MD  SYMTUZA 800-150-200-10 MG TABS TAKE 1 TABLET BY MOUTH DAILY WITH BREAKFAST. 01/13/20   Ginnie Smart, MD    Allergies    Dilaudid [hydromorphone hcl]  Review of Systems   Review of Systems  Constitutional: Positive for chills, fatigue and fever.    HENT: Positive for congestion. Negative for rhinorrhea and sore throat.   Respiratory: Positive for cough. Negative for shortness of breath.   Cardiovascular: Positive for chest pain.  Gastrointestinal: Positive for diarrhea. Negative for abdominal pain, nausea and vomiting.  Musculoskeletal: Positive for myalgias.  Neurological: Positive for headaches. Negative for dizziness and weakness.    Physical Exam Updated Vital Signs BP 130/90 (BP Location: Left Arm)   Pulse 93   Temp 99.8 F (37.7 C) (Oral)   Resp 15   Ht 5\' 4"  (1.626 m)   Wt (!) 145.2 kg   SpO2 98%   BMI 54.93 kg/m   Physical Exam Vitals and nursing note reviewed.  Constitutional:      General: She is not in acute distress.    Appearance: Normal appearance. She is obese. She is ill-appearing. She is not toxic-appearing or diaphoretic.     Comments: Obese AA woman resting in bed, ill-appearing, NAD  HENT:     Head: Normocephalic and atraumatic.  Eyes:     Conjunctiva/sclera: Conjunctivae normal.  Cardiovascular:     Rate and Rhythm: Normal rate and regular rhythm.     Pulses: Normal pulses.     Heart sounds: Normal heart sounds.  Pulmonary:     Effort: Pulmonary effort is normal. No respiratory distress.     Breath sounds: Normal breath sounds. No wheezing, rhonchi or rales.  Abdominal:     General: Abdomen is flat.     Palpations: Abdomen is soft.  Musculoskeletal:        General: No swelling or tenderness.     Right lower leg: No edema.     Left lower leg: No edema.  Skin:    General: Skin is warm and dry.  Neurological:     General: No focal deficit present.     Mental Status: She is alert. Mental status is at baseline.  Psychiatric:        Mood and Affect: Mood normal.        Behavior: Behavior normal.     ED Results / Procedures / Treatments   Labs (all labs ordered are listed, but only abnormal results are displayed) Labs Reviewed - No data to display  EKG EKG  Interpretation  Date/Time:  Saturday April 09 2020 09:29:10 EDT Ventricular Rate:  93 PR Interval:    QRS Duration: 99 QT Interval:  334 QTC Calculation: 416 R Axis:   20 Text Interpretation: Sinus rhythm Borderline T abnormalities, diffuse leads No significant change since last tracing Confirmed by [id number] (Gwyneth Sprout) on 04/09/2020 10:46:30 AM   Radiology No results found.  Procedures Procedures (including critical  care time)  Medications Ordered in ED Medications  acetaminophen (TYLENOL) tablet 1,000 mg (has no administration in time range)    ED Course  I have reviewed the triage vital signs and the nursing notes.  Pertinent labs & imaging results that were available during my care of the patient were reviewed by me and considered in my medical decision making (see chart for details).    MDM Rules/Calculators/A&P                           34 yo woman presenting with Day 3 of COVID-19 with mild, intermittent chest pain. Will obtain EKG. Patient has PMH of HIV, obesity. No signs or symptoms of DVT. Will treat with tylenol for pain.  EKG WNL. Patient takes Symtuza for HIV, last CD4 count in May of this year >400. Qualifies for monoclonal antibody infusion and patient is interested. Will message MAB infusion group for coordination. VSS, return precautions given, patient safe to discharge home.  Final Clinical Impression(s) / ED Diagnoses Final diagnoses:  Pneumonia due to COVID-19 virus    Rx / DC Orders ED Discharge Orders         Ordered    acetaminophen (TYLENOL) 500 MG tablet  Every 6 hours PRN        04/09/20 1055           Shirlean Mylar, MD 04/09/20 1056    Gwyneth Sprout, MD 04/09/20 1959

## 2020-04-09 NOTE — ED Triage Notes (Signed)
Pt was going to have surgery on Monday and when she got tested for covid found out test was positive. Pt has been experiencing cold chills, body aches, dizziness, weakness, N/V/D

## 2020-04-09 NOTE — ED Notes (Signed)
Pt ambulatory from triage 

## 2020-04-10 ENCOUNTER — Emergency Department (HOSPITAL_COMMUNITY)
Admission: EM | Admit: 2020-04-10 | Discharge: 2020-04-11 | Disposition: A | Discharge status: Home / Self Care | Payer: BC Managed Care – PPO | Attending: Emergency Medicine | Admitting: Emergency Medicine

## 2020-04-10 ENCOUNTER — Ambulatory Visit (HOSPITAL_COMMUNITY)
Admission: RE | Admit: 2020-04-10 | Discharge: 2020-04-10 | Disposition: A | Discharge status: Home / Self Care | Payer: BC Managed Care – PPO | Source: Ambulatory Visit | Attending: Pulmonary Disease | Admitting: Pulmonary Disease

## 2020-04-10 ENCOUNTER — Other Ambulatory Visit: Payer: Self-pay

## 2020-04-10 ENCOUNTER — Encounter (HOSPITAL_COMMUNITY): Payer: Self-pay | Admitting: Emergency Medicine

## 2020-04-10 DIAGNOSIS — U071 COVID-19: Secondary | ICD-10-CM

## 2020-04-10 DIAGNOSIS — Z21 Asymptomatic human immunodeficiency virus [HIV] infection status: Secondary | ICD-10-CM | POA: Insufficient documentation

## 2020-04-10 DIAGNOSIS — R5383 Other fatigue: Secondary | ICD-10-CM | POA: Diagnosis present

## 2020-04-10 MED ORDER — SODIUM CHLORIDE 0.9 % IV SOLN: INTRAVENOUS | Status: DC | PRN

## 2020-04-10 MED ORDER — DIPHENHYDRAMINE HCL 50 MG/ML IJ SOLN: 50.0000 mg | Freq: Once | INTRAMUSCULAR | Status: DC | PRN

## 2020-04-10 MED ORDER — METHYLPREDNISOLONE SODIUM SUCC 125 MG IJ SOLR: 125.0000 mg | Freq: Once | INTRAMUSCULAR | Status: DC | PRN

## 2020-04-10 MED ORDER — FAMOTIDINE IN NACL 20-0.9 MG/50ML-% IV SOLN: 20.0000 mg | Freq: Once | INTRAVENOUS | Status: DC | PRN

## 2020-04-10 MED ORDER — EPINEPHRINE 0.3 MG/0.3ML IJ SOAJ: 0.3000 mg | Freq: Once | INTRAMUSCULAR | Status: DC | PRN

## 2020-04-10 MED ORDER — ONDANSETRON 8 MG PO TBDP: 8.0000 mg | ORAL_TABLET | Freq: Once | ORAL | Status: DC

## 2020-04-10 MED ORDER — SODIUM CHLORIDE 0.9 % IV SOLN: Freq: Once | INTRAVENOUS | Status: AC

## 2020-04-10 MED ORDER — ALBUTEROL SULFATE HFA 108 (90 BASE) MCG/ACT IN AERS: 2.0000 | INHALATION_SPRAY | Freq: Once | RESPIRATORY_TRACT | Status: DC | PRN

## 2020-04-10 MED ORDER — ACETAMINOPHEN 500 MG PO TABS
1000.0000 mg | ORAL_TABLET | Freq: Once | ORAL | Status: DC
  Filled 2020-04-10: qty 2

## 2020-04-10 NOTE — ED Triage Notes (Signed)
34 yo female BIBA c/o dizziness and headache. Pt tested positive for COVID 04/09/2020. Pt went to infusion clinic this morning at 10:30 am , but states she started to feel progressively worse. Pt c/o general myalgia and weakness. Per EMS pt states she took tylenol pm 1 hr prior to arrival to ED. EMS states pt was hypotensive on scene last bp 100/60.   Vitals: spo 2 98% on room air Hr 76 rr 20  cbg 147

## 2020-04-10 NOTE — ED Notes (Signed)
This nurse attempted to encourage pt to drink some water in efforts to PO challenge but pt refused at this time. Pt is also refusing all medications offered at this time.

## 2020-04-10 NOTE — ED Triage Notes (Signed)
Ambulated pt in room with portable pulse ox. Pts spo2 sats remained between 97-98%.

## 2020-04-10 NOTE — Progress Notes (Signed)
Patient ID: Brenda Tyler, female   DOB: 1986/04/06, 34 y.o.   MRN: [id number]  Diagnosis: COVID-19  Physician:  Procedure: Cov[facility] Infusion Clinic Med: bamlanivimab\etesevimab infusion - Provided patient with bamlanimivab\etesevimab fact sheet for patients, parents and caregivers prior to infusion.  Complications: No immediate complications noted.  Discharge: Discharged home   Kateri Plummer 04/10/2020

## 2020-04-10 NOTE — ED Provider Notes (Signed)
[facility]-EMERGENCY DEPT Provider Note   CSN: [id number] Arrival date & time: 04/10/20  2253     History Chief Complaint  Patient presents with  . covid +    Brenda Tyler is a 34 y.o. female.  The history is provided by the patient.  Illness Location:  Body  Quality:  Fatigue, doesn't feel well  Severity:  Moderate Onset quality:  Gradual Duration:  4 days Timing:  Constant Progression:  Unchanged Chronicity:  New Context:  COVID Relieved by:  Nothing  Worsened by:  Nothing  Ineffective treatments:  Had MAB but is not taking tylenol  Associated symptoms: fatigue and myalgias   Associated symptoms: no abdominal pain, no chest pain, no congestion, no cough, no diarrhea, no ear pain, no fever, no rash, no rhinorrhea, no shortness of breath and no sore throat   Risk factors:  HIV Patient with PMH significant for HIV who presents for not feeling well and is on day 2 of covid.  Has been seen for same.  Has had MAB infusion but still feels poorly.  Normal BP and O2 saturation.       Past Medical History:  Diagnosis Date  . Anxiety   . GERD (gastroesophageal reflux disease)   . 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 HIV (human immunodeficiency virus infection) (HCC)   . HSV (herpes simplex virus) infection   . Obesity     Patient Active Problem List   Diagnosis Date Noted  . Acute intractable headache 02/23/2020  . Dizziness and giddiness 02/23/2020  . Cyst of bone of left hand 12/10/2019  . Visit for routine gyn exam 07/30/2019  . Desire for pregnancy 07/30/2019  . Dislocation of knee, anterior, left, closed 01/21/2016  . Closed anterior dislocation of left knee 01/21/2016  . Depression 07/26/2014  . Oropharyngeal candidiasis 07/05/2014  . 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 Nausea without vomiting 07/02/2014  . HIV disease (HCC) 04/23/2014  . General  counseling for prescription of oral contraceptives 06/11/2013  . 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 Obesity 12/22/2012    Past Surgical History:  Procedure Laterality Date  . INCISION AND DRAINAGE ABSCESS     on abdomen and buttocks     OB History    Gravida  4   Para  3   Term  3   Preterm      AB  1   Living  3     SAB      TAB  1   Ectopic      Multiple      Live Births              Family History  Problem Relation Age of Onset  . Heart disease Mother   . Hypertension Maternal Grandmother   . Diabetes Maternal Grandmother   . Heart disease Maternal Grandmother   . Cancer Maternal Grandmother   . Hypertension Paternal Grandmother   . Diabetes Paternal Grandmother   . Heart disease Paternal Grandmother     Social History   Tobacco Use  . Smoking status: Never Smoker  . Smokeless tobacco: Never Used  Vaping Use  . Vaping Use: Never used  Substance Use Topics  . Alcohol use: Yes    Alcohol/week: 2.0 standard drinks    Types: 2 Standard drinks or equivalent per week  Comment: occasional  . Drug use: Not Currently    Home Medications Prior to Admission medications   Medication Sig Start Date End Date Taking? Authorizing Provider  acetaminophen (TYLENOL) 500 MG tablet Take 2 tablets (1,000 mg total) by mouth every 6 (six) hours as needed. 04/09/20 04/09/21  Shirlean Mylar, MD  albuterol (PROVENTIL HFA;VENTOLIN HFA) 108 (90 Base) MCG/ACT inhaler Inhale 2 puffs into the lungs every 4 (four) hours as needed for wheezing or shortness of breath. Patient not taking: Reported on 03/09/2020 07/30/18   Anselm Pancoast, PA-C  FLUoxetine (PROZAC) 10 MG capsule Take 1 capsule (10 mg total) by mouth daily. Patient not taking: Reported on 04/07/2020 04/20/19   Ginnie Smart, MD  Multiple Vitamins-Minerals (MULTIVITAMIN GUMMIES ADULT PO) Take 2 tablets by mouth daily.    [provider]  pantoprazole (PROTONIX) 20 MG tablet Take 1 tablet (20 mg  total) by mouth daily. Patient not taking: Reported on 04/07/2020 10/04/19   Bethann Berkshire, MD  SYMTUZA 800-150-200-10 MG TABS TAKE 1 TABLET BY MOUTH DAILY WITH BREAKFAST. 01/13/20   Ginnie Smart, MD    Allergies    Dilaudid [hydromorphone hcl]  Review of Systems   Review of Systems  Constitutional: Positive for fatigue. Negative for fever.  HENT: Negative for congestion, ear pain, rhinorrhea and sore throat.   Eyes: Negative for visual disturbance.  Respiratory: Negative for cough and shortness of breath.   Cardiovascular: Negative for chest pain.  Gastrointestinal: Negative for abdominal pain and diarrhea.  Genitourinary: Negative for difficulty urinating.  Musculoskeletal: Positive for myalgias. Negative for neck pain and neck stiffness.  Skin: Negative for rash.  Neurological: Negative for dizziness.  Psychiatric/Behavioral: Negative for agitation.  All other systems reviewed and are negative.   Physical Exam Updated Vital Signs There were no vitals taken for this visit.  Physical Exam Vitals and nursing note reviewed.  Constitutional:      General: She is not in acute distress.    Appearance: Normal appearance.  HENT:     Head: Normocephalic and atraumatic.     Nose: Nose normal.  Eyes:     Conjunctiva/sclera: Conjunctivae normal.     Pupils: Pupils are equal, round, and reactive to light.  Cardiovascular:     Rate and Rhythm: Normal rate and regular rhythm.     Pulses: Normal pulses.     Heart sounds: Normal heart sounds.  Pulmonary:     Effort: Pulmonary effort is normal.     Breath sounds: Normal breath sounds.  Abdominal:     General: Abdomen is flat. Bowel sounds are normal.     Palpations: Abdomen is soft.     Tenderness: There is no abdominal tenderness. There is no guarding.  Musculoskeletal:        General: Normal range of motion.     Cervical back: Normal range of motion and neck supple.  Skin:    General: Skin is warm and dry.     Capillary  Refill: Capillary refill takes less than 2 seconds.  Neurological:     General: No focal deficit present.     Mental Status: She is alert and oriented to person, place, and time.     Deep Tendon Reflexes: Reflexes normal.  Psychiatric:        Mood and Affect: Mood normal.        Behavior: Behavior normal.     ED Results / Procedures / Treatments   Labs (all labs ordered are listed, but only  abnormal results are displayed) Labs Reviewed - No data to display  EKG None  Radiology No results found.  Procedures Procedures (including critical care time)  Medications Ordered in ED Medications  acetaminophen (TYLENOL) tablet 1,000 mg (has no administration in time range)    ED Course  I have reviewed the triage vital signs and the nursing notes.  Pertinent labs & imaging results that were available during my care of the patient were reviewed by me and considered in my medical decision making (see chart for details).    Patient is well appearing with normal O2 saturation and BP.   I have explained she will not feel well for another 10 days and she should take tylenol every 6 hours for pain and fever.  Obtain a pulse oximeter and return for oxygen saturation < 90%.  No signs of sepsis on exam.  Encouraged PO fluids.  No further interventions necessary at this time.   Brenda Tyler was evaluated in Emergency Department on 04/10/2020 for the symptoms described in the history of present illness. She was evaluated in the context of the global COVID-19 pandemic, which necessitated consideration that the patient might be at risk for infection with the SARS-CoV-2 virus that causes COVID-19. Institutional protocols and algorithms that pertain to the evaluation of patients at risk for COVID-19 are in a state of rapid change based on information released by regulatory bodies including the CDC and federal and state organizations. These policies and algorithms were followed during the patient's  care in the ED.  Final Clinical Impression(s) / ED Diagnoses Return for intractable cough, coughing up blood,fevers >100.4 unrelieved by medication, shortness of breath, intractable vomiting, chest pain, shortness of breath, weakness,numbness, changes in speech, facial asymmetry,abdominal pain, passing out,Inability to tolerate liquids or food, cough, altered mental status or any concerns. No signs of systemic illness or infection. The patient is nontoxic-appearing on exam and vital signs are within normal limits.   I have reviewed the triage vital signs and the nursing notes. Pertinent labs &imaging results that were available during my care of the patient were reviewed by me and considered in my medical decision making (see chart for details).After history, exam, and medical workup I feel the patient has beenappropriately medically screened and is safe for discharge home. Pertinent diagnoses were discussed with the patient. Patient was given return precautions.   Haja Crego, MD 04/10/20 2333

## 2020-04-10 NOTE — Discharge Instructions (Signed)

## 2020-04-11 ENCOUNTER — Ambulatory Visit (HOSPITAL_COMMUNITY)
Admission: RE | Admit: 2020-04-11 | Payer: BC Managed Care – PPO | Source: Home / Self Care | Admitting: Orthopaedic Surgery

## 2020-04-11 HISTORY — DX: Anxiety disorder, unspecified: F41.9

## 2020-04-11 HISTORY — DX: Gastro-esophageal reflux disease without esophagitis: K21.9

## 2020-04-11 SURGERY — EXCISION, GANGLION CYST, WRIST
Anesthesia: Monitor Anesthesia Care | Site: Wrist | Laterality: Left

## 2020-04-12 ENCOUNTER — Telehealth: Payer: Self-pay

## 2020-04-12 ENCOUNTER — Telehealth: Payer: Self-pay | Admitting: *Deleted

## 2020-04-12 NOTE — Telephone Encounter (Signed)
No TOC call needed, pt left AMA.  

## 2020-04-12 NOTE — Telephone Encounter (Signed)
Called patient to assist with follow up appointment ,no answer left message to call back. Elycia Woodside PEC [phone]

## 2020-09-08 ENCOUNTER — Encounter: Payer: Self-pay | Admitting: Infectious Diseases

## 2020-09-08 ENCOUNTER — Ambulatory Visit (INDEPENDENT_AMBULATORY_CARE_PROVIDER_SITE_OTHER): Payer: BC Managed Care – PPO | Admitting: Infectious Diseases

## 2020-09-08 ENCOUNTER — Other Ambulatory Visit: Payer: Self-pay

## 2020-09-08 ENCOUNTER — Ambulatory Visit: Payer: Medicaid Other | Admitting: Infectious Diseases

## 2020-09-08 VITALS — BP 92/67 | HR 88 | Ht 65.0 in | Wt 324.0 lb

## 2020-09-08 DIAGNOSIS — Z6841 Body Mass Index (BMI) 40.0 and over, adult: Secondary | ICD-10-CM | POA: Diagnosis not present

## 2020-09-08 DIAGNOSIS — B20 Human immunodeficiency virus [HIV] disease: Secondary | ICD-10-CM

## 2020-09-08 DIAGNOSIS — M85642 Other cyst of bone, left hand: Secondary | ICD-10-CM | POA: Diagnosis not present

## 2020-09-08 DIAGNOSIS — N911 Secondary amenorrhea: Secondary | ICD-10-CM

## 2020-09-08 DIAGNOSIS — Z79899 Other long term (current) drug therapy: Secondary | ICD-10-CM

## 2020-09-08 DIAGNOSIS — Z113 Encounter for screening for infections with a predominantly sexual mode of transmission: Secondary | ICD-10-CM

## 2020-09-08 DIAGNOSIS — S83115S Anterior dislocation of proximal end of tibia, left knee, sequela: Secondary | ICD-10-CM

## 2020-09-08 NOTE — Assessment & Plan Note (Signed)
She has not had this repaired.  She will f/u with this.

## 2020-09-08 NOTE — Assessment & Plan Note (Addendum)
She is doing well on her current rx rec flu, covid, tdap vax (refused) She consents to COVID vax study. Done.  Offered/refused condoms. Her long term partner does not want to wear condoms and does not want to get tested. We talked about U=U.  Explained her labs.  Will get her in with GYN She asks about cabaneuva. Will get her on clinic waiting list (? If it will help her with adherence).  rtc in 9 months.

## 2020-09-08 NOTE — Progress Notes (Signed)
   Subjective:    Patient ID: Brenda Tyler, female  DOB: 1985/12/22, 35 y.o.        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 Previously started on bactrim and stribild---> symtuza. Her last PAP was 04-2018 (normal).Needs to get repeat.  She was sent to wt loss clinic. She has not been able to make f/u. She will work on this.  Has gym membership. Needs to go back. Feels like she is getting "bigger".  Had COVID (dx10-2021 when she went to have ganglion cyst removed) and became very ill.  Got mAb.  Has questions about cabaneuva.  Has had some missed doses in the last month.   Has had boils in groin, under breasts, which she attributes to her wt and sweating.   HIV 1 RNA Quant (copies/mL)  Date Value  11/19/2019 22 (H)  04/03/2019 31 (H)  06/30/2018 79 (H)   CD4 T Cell Abs (/uL)  Date Value  11/19/2019 410  04/03/2019 437  06/30/2018 400     Health Maintenance  Topic Date Due  . COVID-19 Vaccine (1) Never done  . TETANUS/TDAP  Never done  . INFLUENZA VACCINE  01/24/2020  . PAP SMEAR-Modifier  07/29/2022  . Hepatitis C Screening  Completed  . HIV Screening  Completed  . HPV VACCINES  Aged Out      Review of Systems  Constitutional: Negative for chills, fever and weight loss.  Respiratory: Negative for cough and shortness of breath.   Cardiovascular: Positive for leg swelling.  Gastrointestinal: Negative for constipation and diarrhea.  Genitourinary: Negative for dysuria.       Irregular menses   Please see HPI. All other systems reviewed and negative.     Objective:  Physical Exam Vitals reviewed.  Constitutional:      Appearance: Normal appearance. She is obese.  HENT:     Mouth/Throat:     Mouth: Mucous membranes are moist.     Pharynx: No oropharyngeal exudate.  Eyes:     Extraocular Movements: Extraocular movements intact.     Pupils: Pupils are equal, round, and reactive to light.  Cardiovascular:      Rate and Rhythm: Normal rate and regular rhythm.  Pulmonary:     Effort: Pulmonary effort is normal.     Breath sounds: Normal breath sounds.  Abdominal:     General: Bowel sounds are normal. There is no distension.     Palpations: Abdomen is soft.     Tenderness: There is no abdominal tenderness.  Musculoskeletal:     Cervical back: Normal range of motion and neck supple.     Right lower leg: No edema.     Left lower leg: No edema.  Neurological:     General: No focal deficit present.     Mental Status: She is alert.  Psychiatric:        Mood and Affect: Mood normal.            Assessment & Plan:

## 2020-09-08 NOTE — Assessment & Plan Note (Signed)
She will reschedule this when she has time off from work.

## 2020-09-08 NOTE — Assessment & Plan Note (Signed)
Encouraged her to watch diet and exercise.  She will work on getting back into [facility].

## 2020-09-08 NOTE — Assessment & Plan Note (Signed)
Will get her back in with GYN °

## 2020-10-04 ENCOUNTER — Ambulatory Visit (INDEPENDENT_AMBULATORY_CARE_PROVIDER_SITE_OTHER): Payer: BC Managed Care – PPO | Admitting: Family Medicine

## 2020-10-04 ENCOUNTER — Encounter (INDEPENDENT_AMBULATORY_CARE_PROVIDER_SITE_OTHER): Payer: Self-pay | Admitting: Family Medicine

## 2020-10-04 ENCOUNTER — Other Ambulatory Visit: Payer: Self-pay

## 2020-10-04 VITALS — BP 118/80 | HR 71 | Temp 97.7°F | Ht 64.0 in | Wt 322.0 lb

## 2020-10-04 DIAGNOSIS — E538 Deficiency of other specified B group vitamins: Secondary | ICD-10-CM | POA: Diagnosis not present

## 2020-10-04 DIAGNOSIS — E7800 Pure hypercholesterolemia, unspecified: Secondary | ICD-10-CM

## 2020-10-04 DIAGNOSIS — R5383 Other fatigue: Secondary | ICD-10-CM

## 2020-10-04 DIAGNOSIS — Z1331 Encounter for screening for depression: Secondary | ICD-10-CM | POA: Diagnosis not present

## 2020-10-04 DIAGNOSIS — E559 Vitamin D deficiency, unspecified: Secondary | ICD-10-CM

## 2020-10-04 DIAGNOSIS — R739 Hyperglycemia, unspecified: Secondary | ICD-10-CM

## 2020-10-04 DIAGNOSIS — Z6841 Body Mass Index (BMI) 40.0 and over, adult: Secondary | ICD-10-CM | POA: Diagnosis not present

## 2020-10-04 DIAGNOSIS — R0602 Shortness of breath: Secondary | ICD-10-CM

## 2020-10-04 DIAGNOSIS — E7849 Other hyperlipidemia: Secondary | ICD-10-CM

## 2020-10-05 LAB — T4: T4, Total: 10.2 ug/dL (ref 4.5–12.0)

## 2020-10-05 LAB — CBC WITH DIFFERENTIAL/PLATELET
Basophils Absolute: 0 10*3/uL (ref 0.0–0.2)
Basos: 1 %
EOS (ABSOLUTE): 0.1 10*3/uL (ref 0.0–0.4)
Eos: 3 %
Hematocrit: 37.1 % (ref 34.0–46.6)
Hemoglobin: 12.6 g/dL (ref 11.1–15.9)
Immature Grans (Abs): 0 10*3/uL (ref 0.0–0.1)
Immature Granulocytes: 0 %
Lymphocytes Absolute: 1.7 10*3/uL (ref 0.7–3.1)
Lymphs: 43 %
MCH: 29.6 pg (ref 26.6–33.0)
MCHC: 34 g/dL (ref 31.5–35.7)
MCV: 87 fL (ref 79–97)
Monocytes Absolute: 0.4 10*3/uL (ref 0.1–0.9)
Monocytes: 10 %
Neutrophils Absolute: 1.7 10*3/uL (ref 1.4–7.0)
Neutrophils: 43 %
Platelets: 281 10*3/uL (ref 150–450)
RBC: 4.25 x10E6/uL (ref 3.77–5.28)
RDW: 13.1 % (ref 11.7–15.4)
WBC: 3.9 10*3/uL (ref 3.4–10.8)

## 2020-10-05 LAB — COMPREHENSIVE METABOLIC PANEL
ALT: 11 IU/L (ref 0–32)
AST: 14 IU/L (ref 0–40)
Albumin/Globulin Ratio: 1.1 — ABNORMAL LOW (ref 1.2–2.2)
Albumin: 4.1 g/dL (ref 3.8–4.8)
Alkaline Phosphatase: 135 IU/L — ABNORMAL HIGH (ref 44–121)
BUN/Creatinine Ratio: 14 (ref 9–23)
BUN: 12 mg/dL (ref 6–20)
Bilirubin Total: 0.4 mg/dL (ref 0.0–1.2)
CO2: 22 mmol/L (ref 20–29)
Calcium: 9 mg/dL (ref 8.7–10.2)
Chloride: 102 mmol/L (ref 96–106)
Creatinine, Ser: 0.84 mg/dL (ref 0.57–1.00)
Globulin, Total: 3.7 g/dL (ref 1.5–4.5)
Glucose: 87 mg/dL (ref 65–99)
Potassium: 4.1 mmol/L (ref 3.5–5.2)
Sodium: 140 mmol/L (ref 134–144)
Total Protein: 7.8 g/dL (ref 6.0–8.5)
eGFR: 93 mL/min/{1.73_m2} (ref >59)

## 2020-10-05 LAB — LIPID PANEL WITH LDL/HDL RATIO
Cholesterol, Total: 209 mg/dL — ABNORMAL HIGH (ref 100–199)
HDL: 56 mg/dL (ref >39)
LDL Chol Calc (NIH): 132 mg/dL — ABNORMAL HIGH (ref 0–99)
LDL/HDL Ratio: 2.4 ratio (ref 0.0–3.2)
Triglycerides: 118 mg/dL (ref 0–149)
VLDL Cholesterol Cal: 21 mg/dL (ref 5–40)

## 2020-10-05 LAB — VITAMIN B12: Vitamin B-12: 341 pg/mL (ref 232–1245)

## 2020-10-05 LAB — VITAMIN D 25 HYDROXY (VIT D DEFICIENCY, FRACTURES): Vit D, 25-Hydroxy: 16.9 ng/mL — ABNORMAL LOW (ref 30.0–100.0)

## 2020-10-05 LAB — TSH: TSH: 1.89 u[IU]/mL (ref 0.450–4.500)

## 2020-10-05 LAB — HEMOGLOBIN A1C
Est. average glucose Bld gHb Est-mCnc: 111 mg/dL
Hgb A1c MFr Bld: 5.5 % (ref 4.8–5.6)

## 2020-10-05 LAB — T3: T3, Total: 166 ng/dL (ref 71–180)

## 2020-10-05 LAB — INSULIN, RANDOM: INSULIN: 16.8 u[IU]/mL (ref 2.6–24.9)

## 2020-10-08 ENCOUNTER — Other Ambulatory Visit: Payer: Self-pay

## 2020-10-08 ENCOUNTER — Emergency Department (HOSPITAL_COMMUNITY): Payer: BC Managed Care – PPO

## 2020-10-08 ENCOUNTER — Emergency Department (HOSPITAL_COMMUNITY)
Admission: EM | Admit: 2020-10-08 | Discharge: 2020-10-09 | Disposition: A | Discharge status: Home / Self Care | Payer: BC Managed Care – PPO | Attending: Emergency Medicine | Admitting: Emergency Medicine

## 2020-10-08 ENCOUNTER — Encounter (HOSPITAL_COMMUNITY): Payer: Self-pay

## 2020-10-08 DIAGNOSIS — R112 Nausea with vomiting, unspecified: Secondary | ICD-10-CM | POA: Diagnosis not present

## 2020-10-08 DIAGNOSIS — K219 Gastro-esophageal reflux disease without esophagitis: Secondary | ICD-10-CM | POA: Insufficient documentation

## 2020-10-08 DIAGNOSIS — R1084 Generalized abdominal pain: Secondary | ICD-10-CM | POA: Insufficient documentation

## 2020-10-08 DIAGNOSIS — B20 Human immunodeficiency virus [HIV] disease: Secondary | ICD-10-CM | POA: Insufficient documentation

## 2020-10-08 DIAGNOSIS — Z20822 Contact with and (suspected) exposure to covid-19: Secondary | ICD-10-CM | POA: Diagnosis not present

## 2020-10-08 DIAGNOSIS — R079 Chest pain, unspecified: Secondary | ICD-10-CM | POA: Diagnosis not present

## 2020-10-08 DIAGNOSIS — R1110 Vomiting, unspecified: Secondary | ICD-10-CM | POA: Diagnosis not present

## 2020-10-08 DIAGNOSIS — R110 Nausea: Secondary | ICD-10-CM | POA: Diagnosis not present

## 2020-10-08 DIAGNOSIS — R160 Hepatomegaly, not elsewhere classified: Secondary | ICD-10-CM | POA: Diagnosis not present

## 2020-10-08 DIAGNOSIS — R109 Unspecified abdominal pain: Secondary | ICD-10-CM | POA: Diagnosis not present

## 2020-10-08 LAB — CBC WITH DIFFERENTIAL/PLATELET
Abs Immature Granulocytes: 0.02 10*3/uL (ref 0.00–0.07)
Basophils Absolute: 0 10*3/uL (ref 0.0–0.1)
Basophils Relative: 0 %
Eosinophils Absolute: 0 10*3/uL (ref 0.0–0.5)
Eosinophils Relative: 0 %
HCT: 41.9 % (ref 36.0–46.0)
Hemoglobin: 13.5 g/dL (ref 12.0–15.0)
Immature Granulocytes: 0 %
Lymphocytes Relative: 8 %
Lymphs Abs: 0.6 10*3/uL — ABNORMAL LOW (ref 0.7–4.0)
MCH: 29.1 pg (ref 26.0–34.0)
MCHC: 32.2 g/dL (ref 30.0–36.0)
MCV: 90.3 fL (ref 80.0–100.0)
Monocytes Absolute: 0.6 10*3/uL (ref 0.1–1.0)
Monocytes Relative: 8 %
Neutro Abs: 6 10*3/uL (ref 1.7–7.7)
Neutrophils Relative %: 84 %
Platelets: 264 10*3/uL (ref 150–400)
RBC: 4.64 MIL/uL (ref 3.87–5.11)
RDW: 13.6 % (ref 11.5–15.5)
WBC: 7.2 10*3/uL (ref 4.0–10.5)
nRBC: 0 % (ref 0.0–0.2)

## 2020-10-08 LAB — COMPREHENSIVE METABOLIC PANEL
ALT: 15 U/L (ref 0–44)
AST: 22 U/L (ref 15–41)
Albumin: 4 g/dL (ref 3.5–5.0)
Alkaline Phosphatase: 113 U/L (ref 38–126)
Anion gap: 8 (ref 5–15)
BUN: 8 mg/dL (ref 6–20)
CO2: 23 mmol/L (ref 22–32)
Calcium: 9.2 mg/dL (ref 8.9–10.3)
Chloride: 104 mmol/L (ref 98–111)
Creatinine, Ser: 0.94 mg/dL (ref 0.44–1.00)
GFR, Estimated: >60 mL/min (ref >60)
Glucose, Bld: 120 mg/dL — ABNORMAL HIGH (ref 70–99)
Potassium: 3.9 mmol/L (ref 3.5–5.1)
Sodium: 135 mmol/L (ref 135–145)
Total Bilirubin: 0.5 mg/dL (ref 0.3–1.2)
Total Protein: 8.7 g/dL — ABNORMAL HIGH (ref 6.5–8.1)

## 2020-10-08 LAB — LIPASE, BLOOD: Lipase: 37 U/L (ref 11–51)

## 2020-10-08 LAB — I-STAT BETA HCG BLOOD, ED (MC, WL, AP ONLY): I-stat hCG, quantitative: <5 m[IU]/mL (ref <5)

## 2020-10-08 MED ORDER — ACETAMINOPHEN 325 MG PO TABS
650.0000 mg | ORAL_TABLET | Freq: Once | ORAL | Status: AC
  Administered 2020-10-09: 650 mg via ORAL
  Filled 2020-10-08: qty 2

## 2020-10-08 MED ORDER — SODIUM CHLORIDE 0.9 % IV BOLUS
1000.0000 mL | Freq: Once | INTRAVENOUS | Status: AC
Start: 2020-10-08 — End: 2020-10-08
  Administered 2020-10-08: 1000 mL via INTRAVENOUS

## 2020-10-08 MED ORDER — IOHEXOL 300 MG/ML  SOLN
100.0000 mL | Freq: Once | INTRAMUSCULAR | Status: AC | PRN
  Administered 2020-10-08: 100 mL via INTRAVENOUS

## 2020-10-08 MED ORDER — MORPHINE SULFATE (PF) 4 MG/ML IV SOLN
4.0000 mg | Freq: Once | INTRAVENOUS | Status: AC
  Administered 2020-10-08: 4 mg via INTRAVENOUS
  Filled 2020-10-08: qty 1

## 2020-10-08 MED ORDER — ONDANSETRON HCL 4 MG PO TABS: 4.0000 mg | ORAL_TABLET | Freq: Four times a day (QID) | ORAL | 0 refills | Status: DC

## 2020-10-08 MED ORDER — MORPHINE SULFATE (PF) 4 MG/ML IV SOLN
4.0000 mg | Freq: Once | INTRAVENOUS | Status: AC
Start: 2020-10-08 — End: 2020-10-08
  Administered 2020-10-08: 4 mg via INTRAVENOUS
  Filled 2020-10-08: qty 1

## 2020-10-08 MED ORDER — SODIUM CHLORIDE 0.9 % IV BOLUS
1000.0000 mL | Freq: Once | INTRAVENOUS | Status: AC
  Administered 2020-10-08: 1000 mL via INTRAVENOUS

## 2020-10-08 MED ORDER — ONDANSETRON HCL 4 MG/2ML IJ SOLN
4.0000 mg | Freq: Once | INTRAMUSCULAR | Status: AC
  Administered 2020-10-08: 4 mg via INTRAVENOUS
  Filled 2020-10-08: qty 2

## 2020-10-08 MED ORDER — KETOROLAC TROMETHAMINE 15 MG/ML IJ SOLN
15.0000 mg | Freq: Once | INTRAMUSCULAR | Status: AC
  Administered 2020-10-08: 15 mg via INTRAVENOUS
  Filled 2020-10-08: qty 1

## 2020-10-08 NOTE — Discharge Instructions (Signed)
Please return for any problem.  °

## 2020-10-08 NOTE — ED Provider Notes (Signed)
[facility]-EMERGENCY DEPT Provider Note   CSN: [id number] Arrival date & time: 10/08/20  1735     History Chief Complaint  Patient presents with  . Chest Pain  . Abdominal Pain  . Emesis    Brenda Tyler is a 35 y.o. female.  35 year old female with prior medical history as detailed below presents for evaluation.  Patient complains of nausea and vomiting with associated abdominal discomfort and cramping.  Symptoms began this morning.  Patient reports multiple episodes of vomiting associate with same.  She also complains of loose bowel movements.  She denies fever.  The history is provided by the patient and medical records.  Abdominal Pain Pain location:  Generalized Pain quality: aching   Pain radiates to:  Does not radiate Pain severity:  Mild Onset quality:  Gradual Duration:  12 hours Timing:  Constant Progression:  Waxing and waning Chronicity:  New Relieved by:  Nothing Worsened by:  Nothing Ineffective treatments:  None tried      Past Medical History:  Diagnosis Date  . Anxiety   . GERD (gastroesophageal reflux disease)   . 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 HIV (human immunodeficiency virus infection) (HCC)   . HSV (herpes simplex virus) infection   . Obesity   . Other fatigue   . Shortness of breath on exertion     Patient Active Problem List   Diagnosis Date Noted  . Acute intractable headache 02/23/2020  . Dizziness and giddiness 02/23/2020  . Cyst of bone of left hand 12/10/2019  . Visit for routine gyn exam 07/30/2019  . Desire for pregnancy 07/30/2019  . Dislocation of knee, anterior, left, closed 01/21/2016  . Closed anterior dislocation of left knee 01/21/2016  . Depression 07/26/2014  . Oropharyngeal candidiasis 07/05/2014  . 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 Nausea without vomiting 07/02/2014  . HIV disease (HCC)  04/23/2014  . General counseling for prescription of oral contraceptives 06/11/2013  . 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 Obesity 12/22/2012    Past Surgical History:  Procedure Laterality Date  . INCISION AND DRAINAGE ABSCESS     on abdomen and buttocks     OB History    Gravida  4   Para  3   Term  3   Preterm      AB  1   Living  3     SAB      IAB  1   Ectopic      Multiple      Live Births              Family History  Problem Relation Age of Onset  . Heart disease Mother   . Sudden death Mother   . Stroke Mother   . Hypertension Maternal Grandmother   . Diabetes Maternal Grandmother   . Heart disease Maternal Grandmother   . Cancer Maternal Grandmother   . Hypertension Paternal Grandmother   . Diabetes Paternal Grandmother   . Heart disease Paternal Grandmother     Social History   Tobacco Use  . Smoking status: Never Smoker  . Smokeless tobacco: Never Used  Vaping Use  . Vaping Use: Never used  Substance Use Topics  . Alcohol use: Yes    Alcohol/week: 2.0 standard drinks    Types: 2 Standard drinks or equivalent per week  Comment: occasional  . Drug use: Not Currently    Home Medications Prior to Admission medications   Medication Sig Start Date End Date Taking? Authorizing Provider  Multiple Vitamins-Minerals (MULTIVITAMIN GUMMIES ADULT PO) Take 2 tablets by mouth daily.    [provider]  SYMTUZA 800-150-200-10 MG TABS TAKE 1 TABLET BY MOUTH DAILY WITH BREAKFAST. 01/13/20   Ginnie Smart, MD    Allergies    Dilaudid [hydromorphone hcl]  Review of Systems   Review of Systems  Gastrointestinal: Positive for abdominal pain.  All other systems reviewed and are negative.   Physical Exam Updated Vital Signs BP 114/85   Pulse (!) 109   Temp 99.7 F (37.6 C) (Oral)   Resp (!) 22   Ht 5\' 4"  (1.626 m)   Wt (!) 146.1 kg   SpO2 96%   BMI 55.27 kg/m   Physical Exam Vitals and nursing note reviewed.   Constitutional:      General: She is not in acute distress.    Appearance: She is well-developed.  HENT:     Head: Normocephalic and atraumatic.  Eyes:     Conjunctiva/sclera: Conjunctivae normal.     Pupils: Pupils are equal, round, and reactive to light.  Cardiovascular:     Rate and Rhythm: Normal rate and regular rhythm.     Heart sounds: Normal heart sounds.  Pulmonary:     Effort: Pulmonary effort is normal. No respiratory distress.     Breath sounds: Normal breath sounds.  Abdominal:     General: There is no distension.     Palpations: Abdomen is soft.     Tenderness: There is no abdominal tenderness.  Musculoskeletal:        General: No deformity. Normal range of motion.     Cervical back: Normal range of motion and neck supple.  Skin:    General: Skin is warm and dry.  Neurological:     General: No focal deficit present.     Mental Status: She is alert and oriented to person, place, and time.     ED Results / Procedures / Treatments   Labs (all labs ordered are listed, but only abnormal results are displayed) Labs Reviewed  CBC WITH DIFFERENTIAL/PLATELET - Abnormal; Notable for the following components:      Result Value   Lymphs Abs 0.6 (*)    All other components within normal limits  COMPREHENSIVE METABOLIC PANEL - Abnormal; Notable for the following components:   Glucose, Bld 120 (*)    Total Protein 8.7 (*)    All other components within normal limits  SARS CORONAVIRUS 2 (TAT 6-24 HRS)  LIPASE, BLOOD  URINALYSIS, ROUTINE W REFLEX MICROSCOPIC  I-STAT BETA HCG BLOOD, ED ([facility], [facility], [facility] ONLY)    EKG None  Radiology CT Abdomen Pelvis W Contrast  Result Date: 10/08/2020 CLINICAL DATA:  Nonlocalized acute abdominal pain. EXAM: CT ABDOMEN AND PELVIS WITH CONTRAST TECHNIQUE: Multidetector CT imaging of the abdomen and pelvis was performed using the standard protocol following bolus administration of intravenous contrast. CONTRAST:  10/10/2020 OMNIPAQUE IOHEXOL 300  MG/ML  SOLN COMPARISON:  CT abdomen pelvis 10/04/2019 FINDINGS: Lower chest: Bilateral lower lobe subsegmental atelectasis. Hepatobiliary: Enlarged liver. No focal liver abnormality. Calcified gallstone within the gallbladder lumen. No gallbladder wall thickening or pericholecystic fluid. No biliary dilatation. Pancreas: No focal lesion. Normal pancreatic contour. No surrounding inflammatory changes. No main pancreatic ductal dilatation. Spleen: Normal in size without focal abnormality. Adrenals/Urinary Tract: No adrenal nodule bilaterally. Bilateral  kidneys enhance symmetrically. No hydronephrosis. No hydroureter. The urinary bladder is unremarkable. Stomach/Bowel: Stomach is within normal limits. No evidence of bowel wall thickening or dilatation. Appendix appears normal. Vascular/Lymphatic: No abdominal aorta or iliac aneurysm. No abdominal, pelvic, or inguinal lymphadenopathy. Reproductive: Uterus and bilateral adnexa are unremarkable. Other: No intraperitoneal free fluid. No intraperitoneal free gas. No organized fluid collection. Musculoskeletal: No abdominal wall hernia or abnormality. No suspicious lytic or blastic osseous lesions. No acute displaced fracture. IMPRESSION: 1. Cholelithiasis no CT findings of acute cholecystitis or choledocholithiasis. 2. Hepatomegaly. 3. Normal appendix. Electronically Signed   By: Tish Frederickson M.D.   On: 10/08/2020 23:25   DG Chest Portable 1 View  Result Date: 10/08/2020 CLINICAL DATA:  Chest pain, nausea and vomiting. EXAM: PORTABLE CHEST 1 VIEW COMPARISON:  Chest radiograph 01/05/2020 FINDINGS: Stable cardiomediastinal contours. The lungs are clear. No pneumothorax or large pleural effusion. No acute finding in the visualized skeleton. IMPRESSION: No evidence of active disease. Electronically Signed   By: Emmaline Kluver M.D.   On: 10/08/2020 18:38    Procedures Procedures   Medications Ordered in ED Medications  ondansetron (ZOFRAN) injection 4 mg (4 mg  Intravenous Given 10/08/20 2136)  ketorolac (TORADOL) 15 MG/ML injection 15 mg (15 mg Intravenous Given 10/08/20 2136)  sodium chloride 0.9 % bolus 1,000 mL (1,000 mLs Intravenous Bolus from Bag 10/08/20 2139)    ED Course  I have reviewed the triage vital signs and the nursing notes.  Pertinent labs & imaging results that were available during my care of the patient were reviewed by me and considered in my medical decision making (see chart for details).    MDM Rules/Calculators/A&P                          MDM  MSE complete  Brenda Tyler was evaluated in Emergency Department on 10/08/2020 for the symptoms described in the history of present illness. She was evaluated in the context of the global COVID-19 pandemic, which necessitated consideration that the patient might be at risk for infection with the SARS-CoV-2 virus that causes COVID-19. Institutional protocols and algorithms that pertain to the evaluation of patients at risk for COVID-19 are in a state of rapid change based on information released by regulatory bodies including the CDC and federal and state organizations. These policies and algorithms were followed during the patient's care in the ED.   Patient is presenting for diffuse abdominal discomfort and associated nausea, vomiting, and diarrhea.   Screening labs without significant abnormality.   CT imaging is without significant acute abnormality.  Feels mildly improved after initial liter of IV fluids and antiemetics.  Patient requests additional IV fluids prior to discharge.  Pending discharge signed out to Dr. Read Drivers.   Final Clinical Impression(s) / ED Diagnoses Final diagnoses:  Generalized abdominal pain  Nausea and vomiting, intractability of vomiting not specified, unspecified vomiting type    Rx / DC Orders ED Discharge Orders         Ordered    ondansetron (ZOFRAN) 4 MG tablet  Every 6 hours        10/08/20 2334           Wynetta Fines,  MD 10/08/20 [phone]

## 2020-10-08 NOTE — ED Triage Notes (Signed)
Emergency Medicine Provider Triage Evaluation Note  Brenda Tyler , a 35 y.o. female  was evaluated in triage.  Pt complains of chest pain, abdominal pain, nausea, vomiting, shortness of breath, myalgias, cough, sneezing, chills.  Patient reports that her symptoms all began this morning.  Reports emesis is clear and stomach contents.  Review of Systems  Positive: chest pain, abdominal pain, nausea, vomiting, shortness of breath, myalgias, cough, sneezing, chills, diarrhea Negative: Dysuria, hematuria, fever, palpitations, hematemesis  Physical Exam  BP (!) 154/101   Pulse (!) 106   Temp 99.9 F (37.7 C) (Oral)   Resp 20   Ht 5\' 4"  (1.626 m)   Wt (!) 146.1 kg   SpO2 95%   BMI 55.27 kg/m  Gen:   Awake, no distress   HEENT:  Atraumatic  Resp:  Normal effort, lungs clear to auscultation bilaterally Cardiac:  Tachycardic at rate of 106 Abd:   Nondistended, soft, mild tenderness throughout abdomen MSK:   Moves extremities without difficulty  Neuro:  Speech clear   Medical Decision Making  Medically screening exam initiated at 6:13 PM.  Appropriate orders placed.  Brenda Tyler was informed that the remainder of the evaluation will be completed by another provider, this initial triage assessment does not replace that evaluation, and the importance of remaining in the ED until their evaluation is complete.  Clinical Impression   The patient appears stable so that the remainder of the work up may be completed by another provider.      Brenda Tyler, Brenda Tyler 10/08/20 1816

## 2020-10-08 NOTE — ED Notes (Signed)
Patient transported to CT 

## 2020-10-08 NOTE — ED Triage Notes (Signed)
Chest pain, abdominal pain, vomiting started this morning

## 2020-10-09 DIAGNOSIS — R112 Nausea with vomiting, unspecified: Secondary | ICD-10-CM | POA: Diagnosis not present

## 2020-10-09 LAB — SARS CORONAVIRUS 2 (TAT 6-24 HRS): SARS Coronavirus 2: NEGATIVE

## 2020-10-09 MED ORDER — IBUPROFEN 200 MG PO TABS
600.0000 mg | ORAL_TABLET | Freq: Once | ORAL | Status: AC
  Administered 2020-10-09: 600 mg via ORAL
  Filled 2020-10-09: qty 3

## 2020-10-09 NOTE — ED Notes (Signed)
Pt tolerated PO fluids well. No nausea reported

## 2020-10-09 NOTE — ED Notes (Addendum)
Pt verbalized understanding of d/c, medication, and follow up care. Ambulatory with steady gait.  

## 2020-10-10 ENCOUNTER — Telehealth: Payer: Self-pay | Admitting: *Deleted

## 2020-10-10 NOTE — Telephone Encounter (Signed)
Transition Care Management Unsuccessful Follow-up Telephone Call  Date of discharge and from where:  10/09/2020 - [facility]  Attempts:  1st Attempt  Reason for unsuccessful TCM follow-up call:  Voice mail full

## 2020-10-11 NOTE — Telephone Encounter (Signed)
Transition Care Management Unsuccessful Follow-up Telephone Call  Date of discharge and from where:  10/09/2020 - [facility]  Attempts:  2nd Attempt  Reason for unsuccessful TCM follow-up call:  Voice mail full

## 2020-10-12 NOTE — Progress Notes (Signed)
Chief Complaint:   OBESITY Brenda Tyler (MR# [id number]) is a 35 y.o. female who presents for evaluation and treatment of obesity and related comorbidities. Current BMI is Body mass index is 55.27 kg/m. Brenda Tyler has been struggling with Brenda Tyler weight for many years and has been unsuccessful in either losing weight, maintaining weight loss, or reaching Brenda Tyler healthy weight goal.  Brenda Tyler is currently in the action stage of change and ready to dedicate time achieving and maintaining a healthier weight. Brenda Tyler is interested in becoming our patient and working on intensive lifestyle modifications including (but not limited to) diet and exercise for weight loss.  Brenda Tyler's habits were reviewed today and are as follows: Brenda Tyler family eats meals together, Brenda Tyler desired weight loss is 122 lbs, she started gaining weight 3 years ago, Brenda Tyler heaviest weight ever was 324 pounds, she is a picky eater and doesn't like to eat healthier foods, she has significant food cravings issues, she snacks frequently in the evenings, she skips meals frequently, she is frequently drinking liquids with calories, she frequently makes poor food choices, she frequently eats larger portions than normal and she struggles with emotional eating.  Depression Screen Merdith's Food and Mood (modified PHQ-9) score was 21.  Depression screen [facility] 2/9 10/04/2020  Decreased Interest 3  Down, Depressed, Hopeless 3  PHQ - 2 Score 6  Altered sleeping 2  Tired, decreased energy 3  Change in appetite 3  Feeling bad or failure about yourself  3  Trouble concentrating 3  Moving slowly or fidgety/restless 0  Suicidal thoughts 1  PHQ-9 Score 21  Some recent data might be hidden   Subjective:   1. Other fatigue Brenda Tyler admits to daytime somnolence and admits to waking up still tired. Patent has a history of symptoms of daytime fatigue and morning headache. Brenda Tyler generally gets 4 hours of sleep per night, and states that she has generally restful  sleep. Snoring is present. Apneic episodes are not present. Epworth Sleepiness Score is 4.  2. Shortness of breath on exertion Brenda Tyler notes increasing shortness of breath with exercising and seems to be worsening over time with weight gain. She notes getting out of breath sooner with activity than she used to. This has not gotten worse recently. Andreal denies shortness of breath at rest or orthopnea.  3. Hyperglycemia Brenda Tyler has a history of multiple elevated glucose readings. She has no history of diabetes mellitus.  4. Hyperlipidemia, pure Noralee's last LDL was slightly elevated. She is not on statin, and she is working on diet and weight loss.  5. Vitamin D deficiency Brenda Tyler is at a very high risk of Vit D deficiency. She has no recent labs in Epic.  6. B12 deficiency Brenda Tyler is at high risk of B12 deficiency. She has no recent labs in Epic.  Assessment/Plan:   1. Other fatigue Brenda Tyler does feel that Brenda Tyler weight is causing Brenda Tyler energy to be lower than it should be. Fatigue may be related to obesity, depression or many other causes. Labs will be ordered, and in the meanwhile, Brenda Tyler will focus on self care including making healthy food choices, increasing physical activity and focusing on stress reduction.  - CBC with Differential/Platelet - EKG 12-Lead - T3 - T4 - TSH  2. Shortness of breath on exertion Brenda Tyler does feel that she gets out of breath more easily that she used to when she exercises. Brenda Tyler's shortness of breath appears to be obesity related and exercise induced. She has agreed to  work on weight loss and gradually increase exercise to treat Brenda Tyler exercise induced shortness of breath. Will continue to monitor closely.  3. Hyperglycemia Fasting labs will be obtained today, and results with be discussed with Brenda Tyler in 2 weeks at Brenda Tyler follow up visit. In the meanwhile [name] will start Brenda Tyler Category 3 plan and will work on weight loss efforts.  - Comprehensive metabolic panel -  Hemoglobin A1c - Insulin, random  4. Hyperlipidemia, pure Cardiovascular risk and specific lipid/LDL goals reviewed. We discussed several lifestyle modifications today. We will check labs today. Brenda Tyler will start Brenda Tyler Category 3 plan, and will continue to work on exercise and weight loss efforts. Orders and follow up as documented in patient record.   - Lipid Panel With LDL/HDL Ratio  5. Vitamin D deficiency Low Vitamin D level contributes to fatigue and are associated with obesity, breast, and colon cancer. We will check labs today. Brenda Tyler will follow-up for routine testing of Vitamin D, at least 2-3 times per year to avoid over-replacement.  - VITAMIN D 25 Hydroxy (Vit-D Deficiency, Fractures)  6. B12 deficiency The diagnosis was reviewed with the patient. We will check labs today. Brenda Tyler will continue to follow up as directed. Orders and follow up as documented in patient record.  - Vitamin B12  7. Screening for depression Brenda Tyler had a positive depression screening. Depression is commonly associated with obesity and often results in emotional eating behaviors. We will monitor this closely and work on CBT to help improve the non-hunger eating patterns. Referral to Psychology may be required if no improvement is seen as she continues in our clinic.  8. Class 3 severe obesity with serious comorbidity and body mass index (BMI) of 50.0 to 59.9 in adult, unspecified obesity type Brenda Tyler) Brenda Tyler is currently in the action stage of change and Brenda Tyler goal is to continue with weight loss efforts. I recommend Brenda Tyler begin the structured treatment plan as follows:  She has agreed to the Category 3 Plan.  Exercise goals: No exercise has been prescribed for now, while we concentrate on nutritional changes.   Behavioral modification strategies: increasing lean protein intake and no skipping meals.  She was informed of the importance of frequent follow-up visits to maximize Brenda Tyler success with intensive  lifestyle modifications for Brenda Tyler multiple health conditions. She was informed we would discuss Brenda Tyler lab results at Brenda Tyler next visit unless there is a critical issue that needs to be addressed sooner. Tonika agreed to keep Brenda Tyler next visit at the agreed upon time to discuss these results.  Objective:   Blood pressure 118/80, pulse 71, temperature 97.7 F (36.5 C), height 5\' 4"  (1.626 m), weight (!) 322 lb (146.1 kg), SpO2 99 %. Body mass index is 55.27 kg/m.  EKG: Normal sinus rhythm, rate 66 BPM.  Indirect Calorimeter completed today shows a VO2 of 274 and a REE of 1910.  Brenda Tyler calculated basal metabolic rate is thus Brenda Tyler basal metabolic rate is worse than expected.  General: Cooperative, alert, well developed, in no acute distress. HEENT: Conjunctivae and lids unremarkable. Cardiovascular: Regular rhythm.  Lungs: Normal work of breathing. Neurologic: No focal deficits.   Lab Results  Component Value Date   CREATININE 0.94 10/08/2020   BUN 8 10/08/2020   NA 135 10/08/2020   K 3.9 10/08/2020   CL 104 10/08/2020   CO2 23 10/08/2020   Lab Results  Component Value Date   ALT 15 10/08/2020   AST 22 10/08/2020   ALKPHOS 113 10/08/2020  BILITOT 0.5 10/08/2020   Lab Results  Component Value Date   HGBA1C 5.5 10/04/2020   HGBA1C 5.2 01/10/2018   HGBA1C 5.2 12/22/2012   Lab Results  Component Value Date   INSULIN 16.8 10/04/2020   Lab Results  Component Value Date   TSH 1.890 10/04/2020   Lab Results  Component Value Date   CHOL 209 (H) 10/04/2020   HDL 56 10/04/2020   LDLCALC 132 (H) 10/04/2020   TRIG 118 10/04/2020   CHOLHDL 3.6 11/19/2019   Lab Results  Component Value Date   WBC 7.2 10/08/2020   HGB 13.5 10/08/2020   HCT 41.9 10/08/2020   MCV 90.3 10/08/2020   PLT 264 10/08/2020   No results found for: IRON, TIBC, FERRITIN  Attestation Statements:   Reviewed by clinician on day of visit: allergies, medications, problem list, medical history, surgical  history, family history, social history, and previous encounter notes.  Time spent on visit including pre-visit chart review and post-visit charting and care was 45 minutes.    I, Burt Knack, am acting as transcriptionist for Quillian Quince, MD.  I have reviewed the above documentation for accuracy and completeness, and I agree with the above. - Quillian Quince, MD

## 2020-10-12 NOTE — Telephone Encounter (Signed)
Transition Care Management Follow-up Telephone Call  Date of discharge and from where: 10/09/2020 - [facility]  How have you been since you were released from the hospital? "Feeling better"  Any questions or concerns? No  Items Reviewed:  Did the pt receive and understand the discharge instructions provided? Yes   Medications obtained and verified? Yes   Other? No   Any new allergies since your discharge? No   Dietary orders reviewed? No  Do you have support at home? Yes    Functional Questionnaire: (I = Independent and D = Dependent) ADLs: I  Bathing/Dressing- I  Meal Prep- I  Eating- I  Maintaining continence- I  Transferring/Ambulation- I  Managing Meds- I  Follow up appointments reviewed:   PCP Hospital f/u appt confirmed? No    Specialist Hospital f/u appt confirmed? No    Are transportation arrangements needed? No   If their condition worsens, is the pt aware to call PCP or go to the Emergency Dept.? Yes  Was the patient provided with contact information for the PCP's office or ED? Yes  Was to pt encouraged to call back with questions or concerns? Yes

## 2020-10-14 ENCOUNTER — Telehealth: Payer: Self-pay | Admitting: Family Medicine

## 2020-10-14 ENCOUNTER — Telehealth: Payer: Self-pay | Admitting: *Deleted

## 2020-10-14 NOTE — Telephone Encounter (Addendum)
Pt left VM message and wanted to know if she has any test results. Please call back  1230   Called pt and advised that there are no test results from this office. She stated that she ahd been seen @ [facility] and then was scheduled for appt in our office which she cancelled because she didn't know what it was for. I advised that she was referred to our office by Dr. Ninetta Lights due to not having periods. She then asked to be reinstated to the appt on 4/25 @ 8:35 am.  I told her to come in @ the scheduled time. She agreed.

## 2020-10-14 NOTE — Telephone Encounter (Signed)
Pt called to cx this appt, states that she was not aware of appt and declines visit to this facility. Did not want to resch with this facility

## 2020-10-17 ENCOUNTER — Other Ambulatory Visit: Payer: Self-pay

## 2020-10-17 ENCOUNTER — Encounter: Payer: BC Managed Care – PPO | Admitting: Obstetrics and Gynecology

## 2020-10-17 ENCOUNTER — Encounter: Payer: Self-pay | Admitting: Obstetrics and Gynecology

## 2020-10-17 ENCOUNTER — Ambulatory Visit (INDEPENDENT_AMBULATORY_CARE_PROVIDER_SITE_OTHER): Payer: BC Managed Care – PPO | Admitting: Obstetrics and Gynecology

## 2020-10-17 ENCOUNTER — Other Ambulatory Visit (HOSPITAL_COMMUNITY)
Admission: RE | Admit: 2020-10-17 | Discharge: 2020-10-17 | Disposition: A | Discharge status: Home / Self Care | Payer: BC Managed Care – PPO | Source: Ambulatory Visit | Attending: Obstetrics and Gynecology | Admitting: Obstetrics and Gynecology

## 2020-10-17 VITALS — BP 120/79 | HR 75 | Ht 64.5 in | Wt 316.9 lb

## 2020-10-17 DIAGNOSIS — Z319 Encounter for procreative management, unspecified: Secondary | ICD-10-CM | POA: Diagnosis not present

## 2020-10-17 DIAGNOSIS — Z01419 Encounter for gynecological examination (general) (routine) without abnormal findings: Secondary | ICD-10-CM | POA: Insufficient documentation

## 2020-10-17 DIAGNOSIS — N911 Secondary amenorrhea: Secondary | ICD-10-CM

## 2020-10-17 DIAGNOSIS — Z6841 Body Mass Index (BMI) 40.0 and over, adult: Secondary | ICD-10-CM

## 2020-10-17 MED ORDER — PROGESTERONE 200 MG PO CAPS: 200.0000 mg | ORAL_CAPSULE | Freq: Every day | ORAL | 0 refills | Status: DC

## 2020-10-17 NOTE — Patient Instructions (Signed)
Secondary Amenorrhea  Secondary amenorrhea occurs when a female who was previously having menstrual periods has not had them for 3-6 months. A menstrual period is the monthly shedding of the lining of the uterus. The lining of the uterus is made up of blood, tissue, fluid, and mucus. The flow of blood usually occurs during 3-7 consecutive days each month. This condition has many causes. In many cases, treating the underlying cause will return menstrual periods back to a normal cycle. What are the causes? The most common cause of this condition is pregnancy. Other medical conditions that can cause secondary amenorrhea include:  Cirrhosis of the liver.  Conditions of the blood.  Diabetes.  Epilepsy.  Chronic kidney disease.  Polycystic ovary disease.  A hormonal imbalance.  Ovarian failure.  Cystic fibrosis.  Early menopause.  Cushing syndrome.  Thyroid problems. Other causes may include:  Malnutrition.  Stress or anxiety.  Medicines.  Extreme obesity.  Low body weight or drastic weight loss.  Removal of the ovaries or uterus.  Contraceptive pills, patches, or vaginal rings. What increases the risk? You are more likely to develop this condition if:  You have a family history of this condition.  You have an eating disorder.  You do extreme athletic training.  You have a chronic disease.  You abuse substances such as alcohol or cigarettes. What are the signs or symptoms? The main symptom of this condition is a lack of menstrual periods for 3-6 months in a female who previously had menstrual periods. How is this diagnosed? This condition may be diagnosed based on:  Your medical history.  A physical exam.  A pelvic exam to check for problems with your reproductive organs.  A procedure to examine the uterus.  A measurement of your body mass index (BMI). You may also have other tests, including:  Blood tests that measure certain hormones in your body  and rule out pregnancy.  Urine tests.  Imaging tests, such as an ultrasound, CT scan, or MRI. How is this treated? Treatment for this condition depends on the cause of the amenorrhea. It may involve:  Correcting diet-related problems.  Treating underlying conditions.  Medicines.  Lifestyle changes.  Surgery. If the condition cannot be corrected, it is sometimes possible to start menstrual periods with medicines. Follow these instructions at home: Lifestyle  Maintain a healthy diet. In general, a healthy diet includes lots of fruits and vegetables, low-fat dairy products, lean meats, and foods that contain fiber. Ask to meet with a registered dietitian for nutrition counseling and meal planning.  Maintain a healthy weight. Talk to your health care provider before trying any new diet or exercise plan.  Exercise at least 30 minutes 5 or more days each week. Exercising includes brisk walking, yard work, biking, running, swimming, and team sports like basketball and soccer. Ask your health care provider which exercises are safe for you.  Get enough sleep. Plan your sleep time to allow for 7-9 hours of sleep each night.  Learn to manage stress. Explore relaxation techniques such as meditation, journaling, yoga, or tai chi.      General instructions  Be aware of changes in your menstrual cycle. Keep a record of when you have your menstrual period. Note the date your period starts, how long it lasts, and any problems you experience.  Take over-the-counter and prescription medicines only as told by your health care provider.  Keep all follow-up visits. This is important. Contact a health care provider if:  Your periods   do not return to normal after treatment. Summary  Secondary amenorrhea is when a female who was previously having menstrual periods has not gotten her period for 3-6 months.  This condition has many causes. In many cases, treating the underlying cause will return  menstrual periods back to a normal cycle.  Talk to your health care provider if your periods do not return to normal after treatment. This information is not intended to replace advice given to you by your health care provider. Make sure you discuss any questions you have with your health care provider. Document Revised: 01/27/2020 Document Reviewed: 01/27/2020 Elsevier Patient Education  [place] Maintenance, Female Adopting a healthy lifestyle and getting preventive care are important in promoting health and wellness. Ask your health care provider about:  The right schedule for you to have regular tests and exams.  Things you can do on your own to prevent diseases and keep yourself healthy. What should I know about diet, weight, and exercise? Eat a healthy diet  Eat a diet that includes plenty of vegetables, fruits, low-fat dairy products, and lean protein.  Do not eat a lot of foods that are high in solid fats, added sugars, or sodium.   Maintain a healthy weight Body mass index (BMI) is used to identify weight problems. It estimates body fat based on height and weight. Your health care provider can help determine your BMI and help you achieve or maintain a healthy weight. Get regular exercise Get regular exercise. This is one of the most important things you can do for your health. Most adults should:  Exercise for at least 150 minutes each week. The exercise should increase your heart rate and make you sweat (moderate-intensity exercise).  Do strengthening exercises at least twice a week. This is in addition to the moderate-intensity exercise.  Spend less time sitting. Even light physical activity can be beneficial. Watch cholesterol and blood lipids Have your blood tested for lipids and cholesterol at 36 years of age, then have this test every 5 years. Have your cholesterol levels checked more often if:  Your lipid or cholesterol levels are high.  You are  older than 36 years of age.  You are at high risk for heart disease. What should I know about cancer screening? Depending on your health history and family history, you may need to have cancer screening at various ages. This may include screening for:  Breast cancer.  Cervical cancer.  Colorectal cancer.  Skin cancer.  Lung cancer. What should I know about heart disease, diabetes, and high blood pressure? Blood pressure and heart disease  High blood pressure causes heart disease and increases the risk of stroke. This is more likely to develop in people who have high blood pressure readings, are of African descent, or are overweight.  Have your blood pressure checked: ? Every 3-5 years if you are 33-56 years of age. ? Every year if you are 108 years old or older. Diabetes Have regular diabetes screenings. This checks your fasting blood sugar level. Have the screening done:  Once every three years after age 21 if you are at a normal weight and have a low risk for diabetes.  More often and at a younger age if you are overweight or have a high risk for diabetes. What should I know about preventing infection? Hepatitis B If you have a higher risk for hepatitis B, you should be screened for this virus. Talk with your health care provider to find  out if you are at risk for hepatitis B infection. Hepatitis C Testing is recommended for:  Everyone born from [birth year] through 1966.  Anyone with known risk factors for hepatitis C. Sexually transmitted infections (STIs)  Get screened for STIs, including gonorrhea and chlamydia, if: ? You are sexually active and are younger than 36 years of age. ? You are older than 37 years of age and your health care provider tells you that you are at risk for this type of infection. ? Your sexual activity has changed since you were last screened, and you are at increased risk for chlamydia or gonorrhea. Ask your health care provider if you are at  risk.  Ask your health care provider about whether you are at high risk for HIV. Your health care provider may recommend a prescription medicine to help prevent HIV infection. If you choose to take medicine to prevent HIV, you should first get tested for HIV. You should then be tested every 3 months for as long as you are taking the medicine. Pregnancy  If you are about to stop having your period (premenopausal) and you may become pregnant, seek counseling before you get pregnant.  Take 400 to 800 micrograms (mcg) of folic acid every day if you become pregnant.  Ask for birth control (contraception) if you want to prevent pregnancy. Osteoporosis and menopause Osteoporosis is a disease in which the bones lose minerals and strength with aging. This can result in bone fractures. If you are 63 years old or older, or if you are at risk for osteoporosis and fractures, ask your health care provider if you should:  Be screened for bone loss.  Take a calcium or vitamin D supplement to lower your risk of fractures.  Be given hormone replacement therapy (HRT) to treat symptoms of menopause. Follow these instructions at home: Lifestyle  Do not use any products that contain nicotine or tobacco, such as cigarettes, e-cigarettes, and chewing tobacco. If you need help quitting, ask your health care provider.  Do not use street drugs.  Do not share needles.  Ask your health care provider for help if you need support or information about quitting drugs. Alcohol use  Do not drink alcohol if: ? Your health care provider tells you not to drink. ? You are pregnant, may be pregnant, or are planning to become pregnant.  If you drink alcohol: ? Limit how much you use to 0-1 drink a day. ? Limit intake if you are breastfeeding.  Be aware of how much alcohol is in your drink. In the [place] one drink equals one 12 oz bottle of beer (355 mL), one 5 oz glass of wine (148 mL), or one 1 oz glass of hard liquor  (44 mL). General instructions  Schedule regular health, dental, and eye exams.  Stay current with your vaccines.  Tell your health care provider if: ? You often feel depressed. ? You have ever been abused or do not feel safe at home. Summary  Adopting a healthy lifestyle and getting preventive care are important in promoting health and wellness.  Follow your health care provider's instructions about healthy diet, exercising, and getting tested or screened for diseases.  Follow your health care provider's instructions on monitoring your cholesterol and blood pressure. This information is not intended to replace advice given to you by your health care provider. Make sure you discuss any questions you have with your health care provider. Document Revised: 06/04/2018 Document Reviewed: 06/04/2018 Elsevier Patient Education  [place].

## 2020-10-17 NOTE — Progress Notes (Signed)
Brenda Tyler is a 35 y.o. [phone] female here for a routine annual gynecologic exam and secondary amenorrhea. Pt reports cycles in May and June of last year. No cycle since. H/O no cycles for 9-10 yrs prior. Pt does report excessive wt gain over this period of time. She is working on wt loss and has been seen in the wt loss clinic. She has had a normal TSH and DM work up. She also carries a Dx of HIV and this is stable and managed by ID.  She is sexual active without problems. H/O Covid 10/21. H/O CIN 2, s/p Cryo, pap 2/21 normal TSVD x 3, SAB x 1   She is considering pregnancy and has spoken to Dr Lyndal Rainbow nurse in the past but not formal consult per pt.   Gynecologic History No LMP recorded. Patient is perimenopausal. Contraception: none Last Pap: 10/21. Results were: normal Last mammogram: NA  Obstetric History OB History  Gravida Para Term Preterm AB Living  4 3 3   1 3   SAB IAB Ectopic Multiple Live Births    1          # Outcome Date GA Lbr Len/2nd Weight Sex Delivery Anes PTL Lv  4 Term 09/09/08     Vag-Spont     3 Term 01/10/06     Vag-Spont     2 Term 03/09/02     Vag-Spont     1 IAB             Past Medical History:  Diagnosis Date  . Anxiety   . GERD (gastroesophageal reflux disease)   . 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 HIV (human immunodeficiency virus infection) (HCC)   . HSV (herpes simplex virus) infection   . Obesity   . Oropharyngeal candidiasis 07/05/2014  . Other fatigue   . Shortness of breath on exertion     Past Surgical History:  Procedure Laterality Date  . INCISION AND DRAINAGE ABSCESS     on abdomen and buttocks    Current Outpatient Medications on File Prior to Visit  Medication Sig Dispense Refill  . Multiple Vitamins-Minerals (MULTIVITAMIN GUMMIES ADULT PO) Take 2 tablets by mouth daily.    . SYMTUZA 800-150-200-10 MG TABS TAKE 1  TABLET BY MOUTH DAILY WITH BREAKFAST. 30 tablet 8   No current facility-administered medications on file prior to visit.    Allergies  Allergen Reactions  . Dilaudid [Hydromorphone Hcl] Other (See Comments)    Makes her dizzy    Social History   Socioeconomic History  . Marital status: Single    Spouse name: Not on file  . Number of children: 3  . Years of education: Not on file  . Highest education level: Not on file  Occupational History  . Occupation: housekeeper    09/03/2014: [employer]  Tobacco Use  . Smoking status: Never Smoker  . Smokeless tobacco: Never Used  Vaping Use  . Vaping Use: Never used  Substance and Sexual Activity  . Alcohol use: Yes    Alcohol/week: 2.0 standard drinks    Types: 2 Standard drinks or equivalent per week    Comment: occasional  . Drug use: Not Currently  . Sexual activity: Yes    Partners: Male    Birth control/protection: None    Comment: pt. given condoms  Other Topics  Concern  . Not on file  Social History Narrative  . Not on file   Social Determinants of Health   Financial Resource Strain: Not on file  Food Insecurity: Not on file  Transportation Needs: Not on file  Physical Activity: Not on file  Stress: Not on file  Social Connections: Not on file  Intimate Partner Violence: Not on file    Family History  Problem Relation Age of Onset  . Heart disease Mother   . Sudden death Mother   . Stroke Mother   . Hypertension Maternal Grandmother   . Diabetes Maternal Grandmother   . Heart disease Maternal Grandmother   . Cancer Maternal Grandmother   . Hypertension Paternal Grandmother   . Diabetes Paternal Grandmother   . Heart disease Paternal Grandmother     The following portions of the patient's history were reviewed and updated as appropriate: allergies, current medications, past family history, past medical history, past social history, past surgical history and problem list.  Review of Systems Pertinent  items noted in HPI and remainder of comprehensive ROS otherwise negative.   Objective:  BP 120/79   Pulse 75   Ht 5' 4.5" (1.638 m)   Wt (!) 316 lb 14.4 oz (143.7 kg)   BMI 53.56 kg/m  CONSTITUTIONAL: Well-developed, well-nourished female in no acute distress.  HENT:  Normocephalic, atraumatic, External right and left ear normal. Oropharynx is clear and moist EYES: Conjunctivae and EOM are normal. Pupils are equal, round, and reactive to light. No scleral icterus.  NECK: Normal range of motion, supple, no masses.  Normal thyroid.  SKIN: Skin is warm and dry. No rash noted. Not diaphoretic. No erythema. No pallor. NEUROLGIC: Alert and oriented to person, place, and time. Normal reflexes, muscle tone coordination. No cranial nerve deficit noted. PSYCHIATRIC: Normal mood and affect. Normal behavior. Normal judgment and thought content. CARDIOVASCULAR: Normal heart rate noted, regular rhythm RESPIRATORY: Clear to auscultation bilaterally. Effort and breath sounds normal, no problems with respiration noted. BREASTS: Symmetric in size. No masses, skin changes, nipple drainage, or lymphadenopathy. ABDOMEN: Soft, normal bowel sounds, no distention noted.  No tenderness, rebound or guarding.  PELVIC: Normal appearing external genitalia; normal appearing vaginal mucosa and cervix.  No abnormal discharge noted.  Pap smear obtained.  Normal uterine size, no other palpable masses, no uterine or adnexal tenderness. Exam limited by pt habitus MUSCULOSKELETAL: Normal range of motion. No tenderness.  No cyanosis, clubbing, or edema.  2+ distal pulses.   Assessment:  Annual gynecologic examination with pap smear Secondary amenorrhea HIV Class 3 obesity  Plan:  Will follow up results of pap smear and manage accordingly. Discussed amenorrhea with pt, suspect hormonal and obesity related. Will check labs. Progesterone withdraw. Discussed with pt. Check GYN U/S.   Routine preventative health maintenance  measures emphasized. Please refer to After Visit Summary for other counseling recommendations.  F/U in 4 weeks.  Hermina Staggers, MD, FACOG Attending Obstetrician & Gynecologist [facility], [facility]

## 2020-10-18 ENCOUNTER — Encounter (INDEPENDENT_AMBULATORY_CARE_PROVIDER_SITE_OTHER): Payer: Self-pay | Admitting: Family Medicine

## 2020-10-18 ENCOUNTER — Ambulatory Visit (INDEPENDENT_AMBULATORY_CARE_PROVIDER_SITE_OTHER): Payer: BC Managed Care – PPO | Admitting: Family Medicine

## 2020-10-18 VITALS — BP 117/80 | HR 71 | Temp 98.4°F | Ht 64.0 in | Wt 313.0 lb

## 2020-10-18 DIAGNOSIS — E538 Deficiency of other specified B group vitamins: Secondary | ICD-10-CM | POA: Diagnosis not present

## 2020-10-18 DIAGNOSIS — Z6841 Body Mass Index (BMI) 40.0 and over, adult: Secondary | ICD-10-CM

## 2020-10-18 DIAGNOSIS — E7800 Pure hypercholesterolemia, unspecified: Secondary | ICD-10-CM

## 2020-10-18 DIAGNOSIS — R7303 Prediabetes: Secondary | ICD-10-CM

## 2020-10-18 DIAGNOSIS — E559 Vitamin D deficiency, unspecified: Secondary | ICD-10-CM | POA: Diagnosis not present

## 2020-10-18 DIAGNOSIS — E7849 Other hyperlipidemia: Secondary | ICD-10-CM

## 2020-10-19 LAB — CYTOLOGY - PAP
Adequacy: ABSENT
Diagnosis: NEGATIVE

## 2020-10-19 MED ORDER — METFORMIN HCL 500 MG PO TABS: 500.0000 mg | ORAL_TABLET | Freq: Every morning | ORAL | 0 refills | Status: DC

## 2020-10-19 MED ORDER — VITAMIN D (ERGOCALCIFEROL) 1.25 MG (50000 UNIT) PO CAPS: 50000.0000 [IU] | ORAL_CAPSULE | ORAL | 0 refills | Status: DC

## 2020-10-19 NOTE — Progress Notes (Signed)
Chief Complaint:   OBESITY Brenda Tyler is here to discuss her progress with her obesity treatment plan along with follow-up of her obesity related diagnoses. Brenda Tyler is on the Category 3 Plan and states she is following her eating plan approximately 50% of the time. Brenda Tyler states she is doing stretching, steps, and weights for 60 minutes 2-3 times per week.  Today's visit was #: 2 Starting weight: 322 lbs Starting date: 10/04/2020 Today's weight: 313 lbs Today's date: 10/18/2020 Total lbs lost to date: 9 Total lbs lost since last in-office visit: 9  Interim History: Anamari struggled to follow her plan. She especially struggled with breakfast and mostly tried to portion control. She is open to looking at other eating plans that might fit her better.  Subjective:   1. Pre-diabetes Brenda Tyler has a new diagnosis of pre-diabetes. Her fasting glucose is elevated and she notes polyphagia. She also has a history of irregular menses which could be related to polycystic ovarian syndrome. I discussed labs with the patient today.  2. Hyperlipidemia, pure Brenda Tyler's LDL is above goal, and she denies chest pain. She is working on diet and weight loss. I discussed labs with the patient today.  3. Vitamin D deficiency Brenda Tyler's Vit D level is low. She is not on Vit D currently, and she notes fatigue. I discussed labs with the patient today.  4. B12 deficiency Brenda Tyler's B12 is below goal. She has no history of anemia or malabsorption. She notes fatigue. I discussed labs with the patient today.  Assessment/Plan:   1. Pre-diabetes Brenda Tyler agreed to start metformin 500 mg q daily #30 with no refills. She will continue to work on weight loss, exercise, and decreasing simple carbohydrates to help decrease the risk of diabetes.   2. Hyperlipidemia, pure Cardiovascular risk and specific lipid/LDL goals reviewed. We discussed several lifestyle modifications today and Brenda Tyler will continue to work on diet, exercise  and weight loss efforts. We will recheck labs in 3 months. Orders and follow up as documented in patient record.   3. Vitamin D deficiency Low Vitamin D level contributes to fatigue and are associated with obesity, breast, and colon cancer. Brenda Tyler agreed to start prescription Vitamin D 50,000 IU every week #4 with no refills. She will follow-up for routine testing of Vitamin D, at least 2-3 times per year to avoid over-replacement.  4. B12 deficiency The diagnosis was reviewed with the patient. Brenda Tyler is to continue her B12 rich diet, and we will recheck labs in 3 months. Orders and follow up as documented in patient record.  5. Class 3 severe obesity with serious comorbidity and body mass index (BMI) of 50.0 to 59.9 in adult, unspecified obesity type Brenda Tyler) Brenda Tyler is currently in the action stage of change. As such, her goal is to continue with weight loss efforts. She has agreed to keeping a food journal and adhering to recommended goals of 1200-1500 calories and 85+ grams of protein daily.   Exercise goals: As is.  Behavioral modification strategies: increasing lean protein intake and decreasing simple carbohydrates.  Brenda Tyler has agreed to follow-up with our clinic in 2 weeks. She was informed of the importance of frequent follow-up visits to maximize her success with intensive lifestyle modifications for her multiple health conditions.   Objective:   Blood pressure 117/80, pulse 71, temperature 98.4 F (36.9 C), height 5\' 4"  (1.626 m), weight (!) 313 lb (142 kg), SpO2 100 %. Body mass index is 53.73 kg/m.  General: Cooperative, alert, well developed,  in no acute distress. HEENT: Conjunctivae and lids unremarkable. Cardiovascular: Regular rhythm.  Lungs: Normal work of breathing. Neurologic: No focal deficits.   Lab Results  Component Value Date   CREATININE 0.94 10/08/2020   BUN 8 10/08/2020   NA 135 10/08/2020   K 3.9 10/08/2020   CL 104 10/08/2020   CO2 23 10/08/2020   Lab  Results  Component Value Date   ALT 15 10/08/2020   AST 22 10/08/2020   ALKPHOS 113 10/08/2020   BILITOT 0.5 10/08/2020   Lab Results  Component Value Date   HGBA1C 5.5 10/04/2020   HGBA1C 5.2 01/10/2018   HGBA1C 5.2 12/22/2012   Lab Results  Component Value Date   INSULIN 16.8 10/04/2020   Lab Results  Component Value Date   TSH 1.890 10/04/2020   Lab Results  Component Value Date   CHOL 209 (H) 10/04/2020   HDL 56 10/04/2020   LDLCALC 132 (H) 10/04/2020   TRIG 118 10/04/2020   CHOLHDL 3.6 11/19/2019   Lab Results  Component Value Date   WBC 7.2 10/08/2020   HGB 13.5 10/08/2020   HCT 41.9 10/08/2020   MCV 90.3 10/08/2020   PLT 264 10/08/2020   No results found for: IRON, TIBC, FERRITIN  Attestation Statements:   Reviewed by clinician on day of visit: allergies, medications, problem list, medical history, surgical history, family history, social history, and previous encounter notes.  Time spent on visit including pre-visit chart review and post-visit care and charting was 45 minutes.    I, Burt Knack, am acting as transcriptionist for Quillian Quince, MD.  I have reviewed the above documentation for accuracy and completeness, and I agree with the above. -  Quillian Quince, MD

## 2020-10-24 ENCOUNTER — Ambulatory Visit (HOSPITAL_COMMUNITY)
Admission: RE | Admit: 2020-10-24 | Discharge: 2020-10-24 | Disposition: A | Discharge status: Home / Self Care | Payer: BC Managed Care – PPO | Source: Ambulatory Visit | Attending: Obstetrics and Gynecology | Admitting: Obstetrics and Gynecology

## 2020-10-24 ENCOUNTER — Other Ambulatory Visit: Payer: Self-pay

## 2020-10-24 DIAGNOSIS — N911 Secondary amenorrhea: Secondary | ICD-10-CM | POA: Diagnosis not present

## 2020-10-25 ENCOUNTER — Telehealth: Payer: Self-pay

## 2020-10-25 LAB — FOLLICLE STIMULATING HORMONE: FSH: 53.4 m[IU]/mL

## 2020-10-25 LAB — TESTT+TESTF+SHBG
Sex Hormone Binding: 58 nmol/L (ref 24.6–122.0)
Testosterone, Free: 1.8 pg/mL (ref 0.0–4.2)
Testosterone, Total, LC/MS: 34 ng/dL (ref 10.0–55.0)

## 2020-10-25 LAB — PROLACTIN: Prolactin: 7.3 ng/mL (ref 4.8–23.3)

## 2020-10-25 LAB — LUTEINIZING HORMONE: LH: 48.4 m[IU]/mL

## 2020-10-25 NOTE — Telephone Encounter (Addendum)
-----   Message from Hermina Staggers, MD sent at 10/25/2020  8:42 AM EDT ----- Please let Ms Perren know that her GYN U/S was normal.  Thanks Casimiro Needle   Notified pt results and to continue to come to her appt that is scheduled with Dr. Alysia Penna on 11/14/20 @ 1355.  Pt verbalized understanding with no further questions.   Leonette Nutting  10/25/20

## 2020-11-02 ENCOUNTER — Other Ambulatory Visit: Payer: Self-pay

## 2020-11-02 ENCOUNTER — Encounter (INDEPENDENT_AMBULATORY_CARE_PROVIDER_SITE_OTHER): Payer: Self-pay | Admitting: Family Medicine

## 2020-11-02 ENCOUNTER — Ambulatory Visit (INDEPENDENT_AMBULATORY_CARE_PROVIDER_SITE_OTHER): Payer: BC Managed Care – PPO | Admitting: Family Medicine

## 2020-11-02 VITALS — BP 118/85 | HR 71 | Temp 97.6°F | Ht 64.0 in | Wt 312.0 lb

## 2020-11-02 DIAGNOSIS — Z9189 Other specified personal risk factors, not elsewhere classified: Secondary | ICD-10-CM | POA: Diagnosis not present

## 2020-11-02 DIAGNOSIS — E559 Vitamin D deficiency, unspecified: Secondary | ICD-10-CM | POA: Diagnosis not present

## 2020-11-02 DIAGNOSIS — R7303 Prediabetes: Secondary | ICD-10-CM | POA: Diagnosis not present

## 2020-11-02 DIAGNOSIS — Z6841 Body Mass Index (BMI) 40.0 and over, adult: Secondary | ICD-10-CM | POA: Diagnosis not present

## 2020-11-02 MED ORDER — VITAMIN D (ERGOCALCIFEROL) 1.25 MG (50000 UNIT) PO CAPS: 50000.0000 [IU] | ORAL_CAPSULE | ORAL | 0 refills | Status: DC

## 2020-11-02 MED ORDER — METFORMIN HCL 500 MG PO TABS: 500.0000 mg | ORAL_TABLET | Freq: Two times a day (BID) | ORAL | 0 refills | Status: DC

## 2020-11-04 ENCOUNTER — Other Ambulatory Visit: Payer: Self-pay | Admitting: Obstetrics and Gynecology

## 2020-11-04 DIAGNOSIS — N911 Secondary amenorrhea: Secondary | ICD-10-CM

## 2020-11-10 NOTE — Progress Notes (Signed)
Chief Complaint:   OBESITY Brenda Tyler is here to discuss her progress with her obesity treatment plan along with follow-up of her obesity related diagnoses.   Today's visit was #: 3 Starting weight: 322 lbs Starting date: 10/04/2020 Today's weight: 312 lbs Today's date: 11/02/2020 Weight change since last visit: 1 lb Total lbs lost to date: 10 lbs Body mass index is 53.55 kg/m.  Total weight loss percentage to date: -3.11%  Interim History:  This is Brenda Tyler's first office visit with me.  She was seen by Dr. Dalbert Garnet in the past.  She is disappointed in herself.  Mad she did not eat better.  Had a birthday celebration and even lost a pound since her last office visit.  Current Meal Plan: keeping a food journal and adhering to recommended goals of 1200-1500 calories and 85 grams of protein for 50% of the time.  Current Exercise Plan: Walking for 60 minutes 2-3 times per week.  Assessment/Plan:   Medications Discontinued During This Encounter  Medication Reason  . metFORMIN (GLUCOPHAGE) 500 MG tablet   . Vitamin D, Ergocalciferol, (DRISDOL) 1.25 MG (50000 UNIT) CAPS capsule Reorder   Meds ordered this encounter  Medications  . Vitamin D, Ergocalciferol, (DRISDOL) 1.25 MG (50000 UNIT) CAPS capsule    Sig: Take 1 capsule (50,000 Units total) by mouth every 7 (seven) days.    Dispense:  4 capsule    Refill:  0  . metFORMIN (GLUCOPHAGE) 500 MG tablet    Sig: Take 1 tablet (500 mg total) by mouth 2 (two) times daily with a meal.    Dispense:  60 tablet    Refill:  0   1. Pre-diabetes At goal. Goal is HgbA1c < 5.7.  Medication: metformin 500 mg daily.  Started on metformin at last office visit.  Tolerating well.  Does not feel it is helping.  Plan:  Will increase metformin to twice daily dosing.  Refill sent to pharmacy to day.  She will continue to focus on protein-rich, low simple carbohydrate foods. We reviewed the importance of hydration, regular exercise for stress reduction,  and restorative sleep.   Lab Results  Component Value Date   HGBA1C 5.5 10/04/2020   Lab Results  Component Value Date   INSULIN 16.8 10/04/2020   - Increase metFORMIN (GLUCOPHAGE) 500 MG tablet; Take 1 tablet (500 mg total) by mouth 2 (two) times daily with a meal.  Dispense: 60 tablet; Refill: 0  2. Vitamin D deficiency Not at goal. Current vitamin D is 16.9, tested on 10/04/2020. Optimal goal > 50 ng/dL.  She is taking vitamin D 50,000 IU weekly.  Started on vitamin D at last office visit.  Tolerating well, without side effects.  Plan: Continue to take prescription Vitamin D @50 ,000 IU every week as prescribed.  Follow-up for routine testing of Vitamin D, at least 2-3 times per year to avoid over-replacement.  - Refill Vitamin D, Ergocalciferol, (DRISDOL) 1.25 MG (50000 UNIT) CAPS capsule; Take 1 capsule (50,000 Units total) by mouth every 7 (seven) days.  Dispense: 4 capsule; Refill: 0  3. At risk for diabetes mellitus - Brenda Tyler was given diabetes prevention education and counseling today of more than 10 minutes.  - Counseled patient on pathophysiology of disease and meaning/ implication of lab results.  - Reviewed how certain foods can either stimulate or inhibit insulin release, and subsequently affect hunger pathways  - Importance of following a healthy meal plan with limiting amounts of simple carbohydrates discussed with patient -  Effects of regular aerobic exercise on blood sugar regulation reviewed and encouraged an eventual goal of 30 min 5d/week or more as a minimum.  - Briefly discussed treatment options, which always include dietary and lifestyle modification as first line.   - Handouts provided at patient's desire and/or told to go online to the American Diabetes Association website for further information.  4. Obesity, current BMI 53.6  Course: Brenda Tyler is currently in the action stage of change. As such, her goal is to continue with weight loss efforts.   Nutrition  goals: She has agreed to keeping a food journal and adhering to recommended goals of 1200-1500 calories and 85 grams of protein.   Exercise goals: As is.  Behavioral modification strategies: meal planning and cooking strategies, keeping healthy foods in the home, avoiding temptations and planning for success.  Brenda Tyler has agreed to follow-up with our clinic in 2 weeks. She was informed of the importance of frequent follow-up visits to maximize her success with intensive lifestyle modifications for her multiple health conditions.   Objective:   Blood pressure 118/85, pulse 71, temperature 97.6 F (36.4 C), height 5\' 4"  (1.626 m), weight (!) 312 lb (141.5 kg), SpO2 100 %. Body mass index is 53.55 kg/m.  General: Cooperative, alert, well developed, in no acute distress. HEENT: Conjunctivae and lids unremarkable. Cardiovascular: Regular rhythm.  Lungs: Normal work of breathing. Neurologic: No focal deficits.   Lab Results  Component Value Date   CREATININE 0.94 10/08/2020   BUN 8 10/08/2020   NA 135 10/08/2020   K 3.9 10/08/2020   CL 104 10/08/2020   CO2 23 10/08/2020   Lab Results  Component Value Date   ALT 15 10/08/2020   AST 22 10/08/2020   ALKPHOS 113 10/08/2020   BILITOT 0.5 10/08/2020   Lab Results  Component Value Date   HGBA1C 5.5 10/04/2020   HGBA1C 5.2 01/10/2018   HGBA1C 5.2 12/22/2012   Lab Results  Component Value Date   INSULIN 16.8 10/04/2020   Lab Results  Component Value Date   TSH 1.890 10/04/2020   Lab Results  Component Value Date   CHOL 209 (H) 10/04/2020   HDL 56 10/04/2020   LDLCALC 132 (H) 10/04/2020   TRIG 118 10/04/2020   CHOLHDL 3.6 11/19/2019   Lab Results  Component Value Date   WBC 7.2 10/08/2020   HGB 13.5 10/08/2020   HCT 41.9 10/08/2020   MCV 90.3 10/08/2020   PLT 264 10/08/2020   Attestation Statements:   Reviewed by clinician on day of visit: allergies, medications, problem list, medical history, surgical history,  family history, social history, and previous encounter notes.  I, 10/10/2020, CMA, am acting as Insurance claims handler for Energy manager, DO.  I have reviewed the above documentation for accuracy and completeness, and I agree with the above. [employer], D.O.  The 21st Century Cures Act was signed into law in 2016 which includes the topic of electronic health records.  This provides immediate access to information in MyChart.  This includes consultation notes, operative notes, office notes, lab results and pathology reports.  If you have any questions about what you read please let 2017 know at your next visit so we can discuss your concerns and take corrective action if need be.  We are right here with you.

## 2020-11-13 ENCOUNTER — Other Ambulatory Visit (INDEPENDENT_AMBULATORY_CARE_PROVIDER_SITE_OTHER): Payer: Self-pay | Admitting: Family Medicine

## 2020-11-13 DIAGNOSIS — E559 Vitamin D deficiency, unspecified: Secondary | ICD-10-CM

## 2020-11-14 ENCOUNTER — Other Ambulatory Visit: Payer: Self-pay

## 2020-11-14 ENCOUNTER — Ambulatory Visit (INDEPENDENT_AMBULATORY_CARE_PROVIDER_SITE_OTHER): Payer: BC Managed Care – PPO | Admitting: Obstetrics and Gynecology

## 2020-11-14 ENCOUNTER — Encounter: Payer: Self-pay | Admitting: Obstetrics and Gynecology

## 2020-11-14 VITALS — BP 119/75 | HR 75 | Ht 64.0 in | Wt 311.0 lb

## 2020-11-14 DIAGNOSIS — N911 Secondary amenorrhea: Secondary | ICD-10-CM

## 2020-11-14 MED ORDER — PROGESTERONE 200 MG PO CAPS: ORAL_CAPSULE | ORAL | 1 refills | Status: DC

## 2020-11-14 NOTE — Telephone Encounter (Signed)
Pt last seen by Dr. Opalski.  

## 2020-11-14 NOTE — Progress Notes (Signed)
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 GYN U/S normal. Labs normal except for an elevated FSH. She took 3 days of her Prometrium and had some spotting but stopped due to her birthday.  PE AF VSS Lungs clear Heart RRR Abd soft + BS  A/P Secondary Amenorrhea  GYN U/S and labs reviewed with pt. Will cycle with monthly Prometrium for now. Continue with weight loss. Still desires pregnancy F/U in 6-8 weeks

## 2020-11-16 ENCOUNTER — Other Ambulatory Visit (INDEPENDENT_AMBULATORY_CARE_PROVIDER_SITE_OTHER): Payer: Self-pay | Admitting: Family Medicine

## 2020-11-16 DIAGNOSIS — R7303 Prediabetes: Secondary | ICD-10-CM

## 2020-11-22 ENCOUNTER — Ambulatory Visit (INDEPENDENT_AMBULATORY_CARE_PROVIDER_SITE_OTHER): Payer: BC Managed Care – PPO | Admitting: Physician Assistant

## 2020-11-23 ENCOUNTER — Other Ambulatory Visit: Payer: Self-pay

## 2020-11-23 ENCOUNTER — Ambulatory Visit (INDEPENDENT_AMBULATORY_CARE_PROVIDER_SITE_OTHER): Payer: BC Managed Care – PPO | Admitting: Physician Assistant

## 2020-11-23 VITALS — BP 112/71 | HR 98 | Temp 79.0°F | Ht 64.0 in | Wt 304.0 lb

## 2020-11-23 DIAGNOSIS — Z9189 Other specified personal risk factors, not elsewhere classified: Secondary | ICD-10-CM | POA: Diagnosis not present

## 2020-11-23 DIAGNOSIS — E559 Vitamin D deficiency, unspecified: Secondary | ICD-10-CM

## 2020-11-23 DIAGNOSIS — R7303 Prediabetes: Secondary | ICD-10-CM

## 2020-11-23 DIAGNOSIS — Z6841 Body Mass Index (BMI) 40.0 and over, adult: Secondary | ICD-10-CM | POA: Diagnosis not present

## 2020-11-23 MED ORDER — VITAMIN D (ERGOCALCIFEROL) 1.25 MG (50000 UNIT) PO CAPS
50000.0000 [IU] | ORAL_CAPSULE | ORAL | 0 refills | Status: DC
Start: 2020-11-23 — End: 2021-08-03

## 2020-11-23 MED ORDER — METFORMIN HCL 500 MG PO TABS: 500.0000 mg | ORAL_TABLET | Freq: Two times a day (BID) | ORAL | 0 refills | Status: DC

## 2020-11-25 ENCOUNTER — Other Ambulatory Visit (INDEPENDENT_AMBULATORY_CARE_PROVIDER_SITE_OTHER): Payer: Self-pay | Admitting: Physician Assistant

## 2020-11-25 DIAGNOSIS — E559 Vitamin D deficiency, unspecified: Secondary | ICD-10-CM

## 2020-11-25 DIAGNOSIS — R7303 Prediabetes: Secondary | ICD-10-CM

## 2020-11-28 NOTE — Telephone Encounter (Signed)
Pt last seen by Tracey Aguilar, PA-C.  

## 2020-11-28 NOTE — Progress Notes (Signed)
Chief Complaint:   OBESITY Brenda Tyler is here to discuss her progress with her obesity treatment plan along with follow-up of her obesity related diagnoses. Brenda Tyler is on keeping a food journal and adhering to recommended goals of 1200-1500 calories and 85 grams of protein daily and states she is following her eating plan approximately 50% of the time. Brenda Tyler states she is walking and doing cardio for 60 minutes 2 times per week.  Today's visit was #: 4 Starting weight: 322 lbs Starting date: 10/04/2020 Today's weight: 304 lbs Today's date: 11/23/2020 Total lbs lost to date: 18 Total lbs lost since last in-office visit: 8  Interim History: Brenda Tyler did well with weight loss. She is not journaling consistently, and she does not feel she has time to journal. She is trying to make good choices when eating.  Subjective:   1. Pre-diabetes Brenda Tyler notes the metformin upsets her stomach at times, but she is not always taking it with food.  2. Vitamin D deficiency Brenda Tyler is on Vit D, and she denies nausea, vomiting, or muscle weakness.  3. At risk for diabetes mellitus Brenda Tyler is at higher than average risk for developing diabetes due to obesity.   Assessment/Plan:   1. Pre-diabetes May will continue to work on weight loss, exercise, and decreasing simple carbohydrates to help decrease the risk of diabetes. We will refill metformin for 1 month.  - metFORMIN (GLUCOPHAGE) 500 MG tablet; Take 1 tablet (500 mg total) by mouth 2 (two) times daily with a meal.  Dispense: 60 tablet; Refill: 0  2. Vitamin D deficiency Low Vitamin D level contributes to fatigue and are associated with obesity, breast, and colon cancer. We will refill prescription Vitamin D for 1 month. Brenda Tyler will follow-up for routine testing of Vitamin D, at least 2-3 times per year to avoid over-replacement.  - Vitamin D, Ergocalciferol, (DRISDOL) 1.25 MG (50000 UNIT) CAPS capsule; Take 1 capsule (50,000 Units total) by mouth  every 7 (seven) days.  Dispense: 4 capsule; Refill: 0  3. At risk for diabetes mellitus Brenda Tyler was given approximately 15 minutes of diabetes education and counseling today. We discussed intensive lifestyle modifications today with an emphasis on weight loss as well as increasing exercise and decreasing simple carbohydrates in her diet. We also reviewed medication options with an emphasis on risk versus benefit of those discussed.   Repetitive spaced learning was employed today to elicit superior memory formation and behavioral change.  4. BMI 50.0-59.9, adult Brenda Tyler) Brenda Tyler is currently in the action stage of change. As such, her goal is to continue with weight loss efforts. She has agreed to the Category 3 Plan plus additional breakfast and lunch options.   Exercise goals: As is.  Behavioral modification strategies: meal planning and cooking strategies and keeping healthy foods in the home.  Brenda Tyler has agreed to follow-up with our clinic in 2 to 3 weeks. She was informed of the importance of frequent follow-up visits to maximize her success with intensive lifestyle modifications for her multiple health conditions.   Objective:   Blood pressure 112/71, pulse 98, temperature (!) 79 F (26.1 C), height 5\' 4"  (1.626 m), weight (!) 304 lb (137.9 kg), last menstrual period 10/26/2020, SpO2 98 %. Body mass index is 52.18 kg/m.  General: Cooperative, alert, well developed, in no acute distress. HEENT: Conjunctivae and lids unremarkable. Cardiovascular: Regular rhythm.  Lungs: Normal work of breathing. Neurologic: No focal deficits.   Lab Results  Component Value Date   CREATININE 0.94  10/08/2020   BUN 8 10/08/2020   NA 135 10/08/2020   K 3.9 10/08/2020   CL 104 10/08/2020   CO2 23 10/08/2020   Lab Results  Component Value Date   ALT 15 10/08/2020   AST 22 10/08/2020   ALKPHOS 113 10/08/2020   BILITOT 0.5 10/08/2020   Lab Results  Component Value Date   HGBA1C 5.5 10/04/2020    HGBA1C 5.2 01/10/2018   HGBA1C 5.2 12/22/2012   Lab Results  Component Value Date   INSULIN 16.8 10/04/2020   Lab Results  Component Value Date   TSH 1.890 10/04/2020   Lab Results  Component Value Date   CHOL 209 (H) 10/04/2020   HDL 56 10/04/2020   LDLCALC 132 (H) 10/04/2020   TRIG 118 10/04/2020   CHOLHDL 3.6 11/19/2019   Lab Results  Component Value Date   WBC 7.2 10/08/2020   HGB 13.5 10/08/2020   HCT 41.9 10/08/2020   MCV 90.3 10/08/2020   PLT 264 10/08/2020   No results found for: IRON, TIBC, FERRITIN  Attestation Statements:   Reviewed by clinician on day of visit: allergies, medications, problem list, medical history, surgical history, family history, social history, and previous encounter notes.   Trude Mcburney, am acting as transcriptionist for [employer], PA-C.  I have reviewed the above documentation for accuracy and completeness, and I agree with the above. Alois Cliche, PA-C

## 2020-12-06 ENCOUNTER — Ambulatory Visit (INDEPENDENT_AMBULATORY_CARE_PROVIDER_SITE_OTHER): Payer: BC Managed Care – PPO | Admitting: Family Medicine

## 2020-12-13 ENCOUNTER — Telehealth: Payer: Self-pay

## 2020-12-13 NOTE — Telephone Encounter (Signed)
Pt called to follow up with doctor she has been seeing. Would like a call back.

## 2020-12-14 ENCOUNTER — Telehealth: Payer: Self-pay | Admitting: Obstetrics and Gynecology

## 2020-12-14 NOTE — Telephone Encounter (Signed)
See telephone encounter dated 12/13/20 Josi Roediger,RN

## 2020-12-14 NOTE — Telephone Encounter (Signed)
Patient is requesting a call, she want to discuss her medication

## 2020-12-14 NOTE — Telephone Encounter (Signed)
Patient called again asking for call back. Ambrea Hegler,RN

## 2020-12-15 ENCOUNTER — Telehealth: Payer: Self-pay | Admitting: Obstetrics and Gynecology

## 2020-12-15 NOTE — Telephone Encounter (Signed)
Pt states not having cycle bleeding since taking Prometrium Rx in May. Has taken another round of cyclic Prometrium in June with no bleed.   Pt advised to continue to take monthly until Aug 2022 appt follow with Dr Alysia Penna. Pt advised to monitor and calender any bleeding. Pt verbalized understanding and agreeable to plan of care.  Routing to Dr Alysia Penna for review and any additional recommendations.   Judeth Cornfield, RN

## 2020-12-15 NOTE — Telephone Encounter (Signed)
I called Brenda Tyler and she states she has already spoken with someone and thanked me for calling. Mana Morison,RN

## 2020-12-19 ENCOUNTER — Other Ambulatory Visit (INDEPENDENT_AMBULATORY_CARE_PROVIDER_SITE_OTHER): Payer: Self-pay | Admitting: Family Medicine

## 2020-12-19 ENCOUNTER — Other Ambulatory Visit: Payer: Self-pay | Admitting: Infectious Diseases

## 2020-12-19 DIAGNOSIS — B20 Human immunodeficiency virus [HIV] disease: Secondary | ICD-10-CM

## 2020-12-19 DIAGNOSIS — E559 Vitamin D deficiency, unspecified: Secondary | ICD-10-CM

## 2020-12-20 ENCOUNTER — Other Ambulatory Visit (INDEPENDENT_AMBULATORY_CARE_PROVIDER_SITE_OTHER): Payer: Self-pay | Admitting: Physician Assistant

## 2020-12-20 DIAGNOSIS — R7303 Prediabetes: Secondary | ICD-10-CM

## 2020-12-21 ENCOUNTER — Other Ambulatory Visit (INDEPENDENT_AMBULATORY_CARE_PROVIDER_SITE_OTHER): Payer: Self-pay | Admitting: Family Medicine

## 2020-12-21 DIAGNOSIS — R7303 Prediabetes: Secondary | ICD-10-CM

## 2021-01-10 ENCOUNTER — Other Ambulatory Visit: Payer: Self-pay | Admitting: Sports Medicine

## 2021-01-10 DIAGNOSIS — M25562 Pain in left knee: Secondary | ICD-10-CM

## 2021-01-17 ENCOUNTER — Ambulatory Visit (INDEPENDENT_AMBULATORY_CARE_PROVIDER_SITE_OTHER): Payer: BC Managed Care – PPO | Admitting: Physician Assistant

## 2021-01-17 ENCOUNTER — Other Ambulatory Visit (HOSPITAL_COMMUNITY)
Admission: RE | Admit: 2021-01-17 | Discharge: 2021-01-17 | Disposition: A | Discharge status: Home / Self Care | Payer: BC Managed Care – PPO | Source: Ambulatory Visit | Attending: Physician Assistant | Admitting: Physician Assistant

## 2021-01-17 ENCOUNTER — Other Ambulatory Visit: Payer: Self-pay

## 2021-01-17 ENCOUNTER — Encounter: Payer: Self-pay | Admitting: Physician Assistant

## 2021-01-17 VITALS — BP 115/81 | HR 74 | Temp 97.9°F | Wt 307.2 lb

## 2021-01-17 DIAGNOSIS — B3731 Acute candidiasis of vulva and vagina: Secondary | ICD-10-CM

## 2021-01-17 DIAGNOSIS — B9689 Other specified bacterial agents as the cause of diseases classified elsewhere: Secondary | ICD-10-CM | POA: Diagnosis not present

## 2021-01-17 DIAGNOSIS — Z113 Encounter for screening for infections with a predominantly sexual mode of transmission: Secondary | ICD-10-CM | POA: Insufficient documentation

## 2021-01-17 DIAGNOSIS — B20 Human immunodeficiency virus [HIV] disease: Secondary | ICD-10-CM | POA: Diagnosis not present

## 2021-01-17 DIAGNOSIS — B373 Candidiasis of vulva and vagina: Secondary | ICD-10-CM | POA: Diagnosis not present

## 2021-01-17 DIAGNOSIS — N760 Acute vaginitis: Secondary | ICD-10-CM

## 2021-01-17 MED ORDER — FLUCONAZOLE 150 MG PO TABS: 150.0000 mg | ORAL_TABLET | Freq: Once | ORAL | 0 refills | Status: DC

## 2021-01-17 MED ORDER — METRONIDAZOLE 500 MG PO TABS: 500.0000 mg | ORAL_TABLET | Freq: Two times a day (BID) | ORAL | 0 refills | Status: AC

## 2021-01-17 NOTE — Progress Notes (Signed)
Subjective:    Patient ID: Brenda Tyler, female    DOB: 03/30/1986, 35 y.o.   MRN: [id number]  Chief Complaint  Patient presents with   Exposure to STD     HPI:  Brenda Tyler is a 35 y.o. female HIV positive well controlled and fairly adherent to current regimen of Symtuza. She reports missing 2-3 doses on the weekends when she goes out and has "a few drinks." She would like to start Cabanuva ASAP and will schedule a regimen change appointment discussion with Dr. Ninetta Lights in the next months.    She reports today requesting STI testing with possible exposure. She has a long term boyfriend, however had a new partner recently with vaginal intercourse.  They notified her afterward they have a history of herpes but was not having an outbreak.  Condom was used. She has no known history of HSV 1 or 2, explained testing is notorious for fasle positives and 90% are positive.  Discussed transmission.  She is currently asymptomatic.  She denies lesions, rash, fever, abd pain, vaginal discharge, dysuria, urinary frequency, urgency.  FSH in postmenopausal range.   PMH: Reports frequent vaginal itching with thick white discharge-uses OTC  "Honey Pot" as a preventative.  Currently no symptoms.  Also notes frequent "fishy smelling" water discharge that is chronic.  No previous history of gonorrhea, chlamydia, syphillis. + trich 2016  Allergies  Allergen Reactions   Dilaudid [Hydromorphone Hcl] Other (See Comments)    Makes her dizzy      Outpatient Medications Prior to Visit  Medication Sig Dispense Refill   metFORMIN (GLUCOPHAGE) 500 MG tablet Take 1 tablet (500 mg total) by mouth 2 (two) times daily with a meal. 60 tablet 0   progesterone (PROMETRIUM) 200 MG capsule Take 1 table first ten days of each month 30 capsule 1   SYMTUZA 800-150-200-10 MG TABS TAKE 1 TABLET BY MOUTH DAILY WITH BREAKFAST. 30 tablet 8   Vitamin D, Ergocalciferol, (DRISDOL) 1.25 MG (50000 UNIT) CAPS capsule Take 1  capsule (50,000 Units total) by mouth every 7 (seven) days. 4 capsule 0   No facility-administered medications prior to visit.     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    Objective:    BP 115/81   Pulse 74   Temp 97.9 F (36.6 C)   Wt (!) 307 lb 3.2 oz (139.3 kg)   BMI 52.73 kg/m  Nursing note and vital signs reviewed.  Physical Exam Vitals reviewed.  Constitutional:      Appearance: Normal appearance. She is obese.  HENT:     Head: Normocephalic and atraumatic.  Eyes:     Extraocular Movements: Extraocular movements intact.     Conjunctiva/sclera: Conjunctivae normal.     Pupils: Pupils are equal, round,  and reactive to light.  Cardiovascular:     Pulses: Normal pulses.     Heart sounds: Normal heart sounds.  Pulmonary:     Effort: Pulmonary effort is normal.     Breath sounds: Normal breath sounds.  Abdominal:     General: Bowel sounds are normal.     Palpations: Abdomen is soft.  Skin:    General: Skin is warm and dry.     Findings: No rash.  Neurological:     Mental Status: She is alert and oriented to person, place, and time.  Psychiatric:        Mood and Affect: Mood normal.        Thought Content: Thought content normal.        Judgment: Judgment normal.     Depression screen [facility] 2/9 11/14/2020 10/04/2020 09/08/2020 12/10/2019 07/31/2019  Decreased Interest 0 3 0 0 2  Down, Depressed,  Hopeless 0 3 0 0 2  PHQ - 2 Score 0 6 0 0 4  Altered sleeping 0 2 - - 2  Tired, decreased energy 0 3 - - 2  Change in appetite 0 3 - - 2  Feeling bad or failure about yourself  0 3 - - 3  Trouble concentrating 0 3 - - 3  Moving slowly or fidgety/restless 0 0 - - 3  Suicidal thoughts 0 1 - - 3  PHQ-9 Score 0 21 - - 22  Some recent data might be hidden       Assessment & Plan:  HIV-reviewed labs 11/19/2019 VL 22, schedule discussion with Dr. Ninetta Lights regarding regimen change to Cabanuva-discussed risks, side effects, administration and reviewed genotype  STI testing-recent new partner, condoms used 70% of the time, specifically requesting HSV 1/2 testing. Discussed 905 positive, frequent false positive, no previous history of outbreaks. Currentlly asymptomatic add RPR, GC/chalmydia,   BV-chronic watery foul smelling vaginal discharge following intercourse -take metronidazole 500 mg twice daily x 7 days-no discharge currenlty  Vaginal candidiasis-frequent, vulvar itching, thick discharge managed with "honey Pot" one dose of diflucan 150 po once if needed.     Patient Active Problem List   Diagnosis Date Noted   Pre-diabetes 11/02/2020   Vitamin D deficiency 11/02/2020   At risk for diabetes mellitus 11/02/2020   Acute intractable headache 02/23/2020   Dizziness and giddiness 02/23/2020   Cyst of bone of left hand 12/10/2019   Visit for routine gyn exam 07/30/2019   Desire for pregnancy 07/30/2019   Dislocation of knee, anterior, left, closed 01/21/2016   Closed anterior dislocation of left knee 01/21/2016   Depression 07/26/2014   Herpes labialis 07/03/2014   HIV disease (HCC) 04/23/2014   General counseling for prescription of oral contraceptives 06/11/2013   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 Dasaro maintain her progesterone, Vitamin D (Ergocalciferol), metFORMIN, and Symtuza.   No orders of the defined types were placed in this encounter.    Follow-up: Return if symptoms worsen or fail to improve,  for Dr. Ninetta Lights to discuss cabanuva.

## 2021-01-17 NOTE — Patient Instructions (Addendum)
Schedule appointment for Cabanuva to discuss with Dr. Ninetta Lights Highly encourage condom use Will send diflucan and medication for bacterial vaginosis as we discussed  Labs

## 2021-01-18 ENCOUNTER — Ambulatory Visit
Admission: RE | Admit: 2021-01-18 | Discharge: 2021-01-18 | Disposition: A | Discharge status: Home / Self Care | Payer: BC Managed Care – PPO | Source: Ambulatory Visit | Attending: Sports Medicine | Admitting: Sports Medicine

## 2021-01-18 ENCOUNTER — Telehealth: Payer: Self-pay | Admitting: Physician Assistant

## 2021-01-18 DIAGNOSIS — M25562 Pain in left knee: Secondary | ICD-10-CM

## 2021-01-18 LAB — URINE CYTOLOGY ANCILLARY ONLY
Chlamydia: NEGATIVE
Comment: NEGATIVE
Comment: NORMAL
Neisseria Gonorrhea: NEGATIVE

## 2021-01-18 LAB — T-HELPER CELL (CD4) - (RCID CLINIC ONLY)
CD4 % Helper T Cell: 30 % — ABNORMAL LOW (ref 33–65)
CD4 T Cell Abs: 429 /uL (ref 400–1790)

## 2021-01-19 DIAGNOSIS — M25562 Pain in left knee: Secondary | ICD-10-CM | POA: Diagnosis not present

## 2021-01-19 NOTE — Telephone Encounter (Signed)
Spoke with Brenda Tyler 01/19/2021, reviewed all of her lrecent STI lab results and discussed in detail HSV 1&2- management, treatment options, transmission risks, answered all questions.

## 2021-01-26 LAB — HSV 1/2 AB (IGM), IFA W/RFLX TITER
HSV 1 IgM Screen: NEGATIVE
HSV 2 IgM Screen: NEGATIVE

## 2021-01-26 LAB — HIV-1 RNA QUANT-NO REFLEX-BLD
HIV 1 RNA Quant: NOT DETECTED Copies/mL
HIV-1 RNA Quant, Log: NOT DETECTED Log cps/mL

## 2021-01-26 LAB — RPR: RPR Ser Ql: NONREACTIVE

## 2021-01-26 LAB — HSV(HERPES SIMPLEX VRS) I + II AB-IGG
HAV 1 IGG,TYPE SPECIFIC AB: 12.2 index — ABNORMAL HIGH
HSV 2 IGG,TYPE SPECIFIC AB: 8.32 index — ABNORMAL HIGH

## 2021-01-30 ENCOUNTER — Ambulatory Visit: Payer: BC Managed Care – PPO | Admitting: Obstetrics and Gynecology

## 2021-02-02 ENCOUNTER — Ambulatory Visit: Payer: BC Managed Care – PPO | Admitting: Infectious Diseases

## 2021-02-14 ENCOUNTER — Other Ambulatory Visit: Payer: Self-pay

## 2021-02-14 ENCOUNTER — Emergency Department (HOSPITAL_COMMUNITY): Payer: BC Managed Care – PPO

## 2021-02-14 ENCOUNTER — Emergency Department (HOSPITAL_COMMUNITY)
Admission: EM | Admit: 2021-02-14 | Discharge: 2021-02-14 | Disposition: A | Discharge status: Home / Self Care | Payer: BC Managed Care – PPO | Attending: Emergency Medicine | Admitting: Emergency Medicine

## 2021-02-14 DIAGNOSIS — Z7984 Long term (current) use of oral hypoglycemic drugs: Secondary | ICD-10-CM | POA: Insufficient documentation

## 2021-02-14 DIAGNOSIS — M25571 Pain in right ankle and joints of right foot: Secondary | ICD-10-CM

## 2021-02-14 DIAGNOSIS — S93401A Sprain of unspecified ligament of right ankle, initial encounter: Secondary | ICD-10-CM | POA: Insufficient documentation

## 2021-02-14 DIAGNOSIS — S99911A Unspecified injury of right ankle, initial encounter: Secondary | ICD-10-CM | POA: Diagnosis present

## 2021-02-14 DIAGNOSIS — Z21 Asymptomatic human immunodeficiency virus [HIV] infection status: Secondary | ICD-10-CM | POA: Diagnosis not present

## 2021-02-14 DIAGNOSIS — E119 Type 2 diabetes mellitus without complications: Secondary | ICD-10-CM | POA: Diagnosis not present

## 2021-02-14 DIAGNOSIS — Y92410 Unspecified street and highway as the place of occurrence of the external cause: Secondary | ICD-10-CM | POA: Diagnosis not present

## 2021-02-14 MED ORDER — HYDROCODONE-ACETAMINOPHEN 5-325 MG PO TABS
2.0000 | ORAL_TABLET | Freq: Once | ORAL | Status: AC
  Administered 2021-02-14: 2 via ORAL
  Filled 2021-02-14: qty 2

## 2021-02-14 MED ORDER — ONDANSETRON HCL 4 MG/2ML IJ SOLN
4.0000 mg | Freq: Once | INTRAMUSCULAR | Status: AC
  Administered 2021-02-14: 4 mg via INTRAVENOUS
  Filled 2021-02-14: qty 2

## 2021-02-14 MED ORDER — FENTANYL CITRATE (PF) 100 MCG/2ML IJ SOLN
50.0000 ug | Freq: Once | INTRAMUSCULAR | Status: AC
  Administered 2021-02-14: 50 ug via INTRAVENOUS
  Filled 2021-02-14: qty 2

## 2021-02-14 MED ORDER — HYDROCODONE-ACETAMINOPHEN 5-325 MG PO TABS: 1.0000 | ORAL_TABLET | Freq: Four times a day (QID) | ORAL | 0 refills | Status: DC | PRN

## 2021-02-14 MED ORDER — MORPHINE SULFATE (PF) 4 MG/ML IV SOLN
4.0000 mg | Freq: Once | INTRAVENOUS | Status: AC
  Administered 2021-02-14: 4 mg via INTRAVENOUS
  Filled 2021-02-14: qty 1

## 2021-02-14 MED ORDER — FENTANYL CITRATE (PF) 100 MCG/2ML IJ SOLN
150.0000 ug | Freq: Once | INTRAMUSCULAR | Status: AC
  Administered 2021-02-14: 150 ug via INTRAVENOUS
  Filled 2021-02-14: qty 4
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
## 2021-02-15 ENCOUNTER — Ambulatory Visit: Payer: Self-pay | Admitting: *Deleted

## 2021-02-15 ENCOUNTER — Telehealth: Payer: Self-pay
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
## 2021-02-15 NOTE — Telephone Encounter (Signed)
Transition Care Management Unsuccessful Follow-up Telephone Call  Date of discharge and from where:  02/14/2021-[facility]  Attempts:  1st Attempt  Reason for unsuccessful TCM follow-up call:  Left voice message

## 2021-02-16 NOTE — Telephone Encounter (Signed)
Transition Care Management Follow-up Telephone Call Date of discharge and from where: 02/14/2021-[facility]  How have you been since you were released from the hospital? Patient stated she is still in pain but has a follow up tomorrow. Any questions or concerns? No  Items Reviewed: Did the pt receive and understand the discharge instructions provided? Yes  Medications obtained and verified? Yes  Other? No  Any new allergies since your discharge? No  Dietary orders reviewed? N/A Do you have support at home? Yes   Home Care and Equipment/Supplies: Were home health services ordered? not applicable If so, what is the name of the agency? N/A  Has the agency set up a time to come to the patient's home? not applicable Were any new equipment or medical supplies ordered?  No What is the name of the medical supply agency? N/A Were you able to get the supplies/equipment? not applicable Do you have any questions related to the use of the equipment or supplies? No  Functional Questionnaire: (I = Independent and D = Dependent) ADLs: I  Bathing/Dressing- I  Meal Prep- I  Eating- I  Maintaining continence- I  Transferring/Ambulation- I  Managing Meds- I  Follow up appointments reviewed:  PCP Hospital f/u appt confirmed? No   Specialist Hospital f/u appt confirmed? Yes  Scheduled to see Ortho on 02/17/2021.. Are transportation arrangements needed? No  If their condition worsens, is the pt aware to call PCP or go to the Emergency Dept.? Yes Was the patient provided with contact information for the PCP's office or ED? Yes Was to pt encouraged to call back with questions or concerns? Yes

## 2021-03-14 ENCOUNTER — Other Ambulatory Visit: Payer: Self-pay | Admitting: Orthopaedic Surgery

## 2021-03-14 DIAGNOSIS — M25571 Pain in right ankle and joints of right foot: Secondary | ICD-10-CM

## 2021-03-16 ENCOUNTER — Ambulatory Visit: Payer: BC Managed Care – PPO | Admitting: Obstetrics and Gynecology

## 2021-03-31 ENCOUNTER — Ambulatory Visit: Payer: BC Managed Care – PPO

## 2021-04-06 ENCOUNTER — Ambulatory Visit
Admission: RE | Admit: 2021-04-06 | Discharge: 2021-04-06 | Disposition: A | Discharge status: Home / Self Care | Payer: BC Managed Care – PPO | Source: Ambulatory Visit | Attending: Orthopaedic Surgery | Admitting: Orthopaedic Surgery

## 2021-04-06 ENCOUNTER — Other Ambulatory Visit: Payer: Self-pay

## 2021-04-06 DIAGNOSIS — M25571 Pain in right ankle and joints of right foot: Secondary | ICD-10-CM

## 2021-04-12 ENCOUNTER — Ambulatory Visit: Payer: BC Managed Care – PPO | Attending: Orthopaedic Surgery

## 2021-04-12 ENCOUNTER — Other Ambulatory Visit: Payer: Self-pay

## 2021-04-12 DIAGNOSIS — M25571 Pain in right ankle and joints of right foot: Secondary | ICD-10-CM

## 2021-04-12 DIAGNOSIS — R262 Difficulty in walking, not elsewhere classified: Secondary | ICD-10-CM | POA: Diagnosis present

## 2021-04-12 DIAGNOSIS — M25671 Stiffness of right ankle, not elsewhere classified: Secondary | ICD-10-CM | POA: Diagnosis present

## 2021-04-12 DIAGNOSIS — M6281 Muscle weakness (generalized): Secondary | ICD-10-CM
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
## 2021-04-20 ENCOUNTER — Ambulatory Visit: Payer: BC Managed Care – PPO | Admitting: Physical Therapy

## 2021-04-20 ENCOUNTER — Encounter: Payer: Self-pay | Admitting: Physical Therapy

## 2021-04-20 ENCOUNTER — Other Ambulatory Visit: Payer: Self-pay

## 2021-04-20 DIAGNOSIS — M6281 Muscle weakness (generalized): Secondary | ICD-10-CM

## 2021-04-20 DIAGNOSIS — R262 Difficulty in walking, not elsewhere classified: Secondary | ICD-10-CM

## 2021-04-20 DIAGNOSIS — M25571 Pain in right ankle and joints of right foot: Secondary | ICD-10-CM | POA: Diagnosis not present

## 2021-04-20 DIAGNOSIS — M25671 Stiffness of right ankle, not elsewhere classified: Secondary | ICD-10-CM

## 2021-04-20 NOTE — Therapy (Signed)
[facility] [address] [place], [place], [address] Phone: [phone]   Fax:  [phone]  Physical Therapy Treatment  Patient Details  Name: Brenda Tyler MRN: [id number] Date of Birth: 1986/04/22 Referring Provider (PT): Terance Hart, MD   Encounter Date: 04/20/2021   PT End of Session - 04/20/21 1009     Visit Number 2    Number of Visits 17    Date for PT Re-Evaluation 06/10/21    Authorization Type BCBS primary, MCD secondary    PT Start Time 0936    PT Stop Time 1015    PT Time Calculation (min) 39 min             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   Subjective Assessment - 04/20/21 0850     Subjective Pain is off and on and depends on how long I am on it. I have a lot to do and it hurts.    Diagnostic tests IMPRESSION:  1. Patchy areas of marrow edema involving the ankle, hindfoot and  midfoot. These could be bone contusions or stress reactions. No  discrete fracture. Other possibility would include disuse  osteoporosis or regional pain syndrome.  2. Complete tears of the anterior talofibular ligament and  calcaneofibular ligament. The posterior talofibular ligament is  intact.  3. Mild tenosynovitis involving the posterior tibialis tendon.  4. 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    Currently in Pain?  No/denies    Pain Score --   8/10 with walking   Pain Location Ankle    Pain Orientation Right    Pain Descriptors / Indicators Tightness   stiffness   Aggravating Factors  weightbearing, walking    Pain Relieving Factors rest                   [facility] Adult PT Treatment/Exercise - 04/20/21 0001       Ambulation/Gait   Gait Comments one crutch and step through pattern - cues for technique      Self-Care   Other Self-Care Comments  self PROM 4 way  ankle      Manual Therapy   Manual therapy comments passive ankle ROM each way      Ankle Exercises: Stretches   Soleus Stretch Limitations seated with towel    Gastroc Stretch Limitations seated with towel      Ankle Exercises: Seated   Ankle Circles/Pumps 10 reps    Towel Crunch 5 reps    Towel Inversion/Eversion 5 reps    Heel Raises 10 reps    Toe Raise 10 reps                       PT Short Term  Goals - 04/12/21 1433       PT SHORT TERM GOAL #1   Title Patient will be independent with initial HEP.    Baseline issued at eval.    Status New    Target Date 04/26/21      PT SHORT TERM GOAL #2   Title Patient will demonstrate at least 5 degrees of Rt ankle inversion and eversion AROM to improve ability to navigate on uneven terrain.    Baseline see flowsheet    Status New    Target Date 05/10/21      PT SHORT TERM GOAL #3   Title Patient will ambulate with step through pattern utilizing LRAD once cleared by MD for weight-bearing.    Status New    Target Date 05/10/21               PT Long Term Goals - 04/12/21 1434       PT LONG TERM GOAL #1   Title Patient will demonstrate at least 4/5 strength about the Rt ankle to improve overall stability necessary for prolonged standing and walking.    Baseline not tested    Status New    Target Date 06/07/21      PT LONG TERM GOAL #2   Title Patient will maintain SLS for at least 5 seconds on the RLE to signify improved ankle stability for  functional activities.    Baseline unable    Status New    Target Date 06/07/21      PT LONG TERM GOAL #3   Title Patient will demonstrate at least 5 degrees of ankle DF AROM to improve gait mechanics.    Baseline see flowsheet    Status New    Target Date 06/07/21      PT LONG TERM GOAL #4   Title Patient will tolerate at least 30 minutes of continous standing activity once cleared by MD for weight-bearing to improve her tolerance to work specific activity.    Status New    Target Date 06/07/21                   Plan - 04/20/21 1006     Clinical Impression Statement [name] saw MD this week who stated that she had torn ligaments and bone fragments so that her healing time might be longer. He told her that surgery was not indicated at this time and she will return for F/U in 6 weeks. She did not get clarification on weightbearing status other than she can take the boot off for sleep. She arrives with bilat crutches and step to pattern. Worked on gait with step through pattern using bilateral and single crutch. She voices frustration with the difficulty performing ADLS due to pain. She went to [employer] and used a cart without AD and suffered a great deal of pain. She rpeorts compliance with HEP. Her ankle remains very stiff with AROM. PROM and self stretching instruction performed and her ankle was visibly with improved ROM afterward. She was encouraged to continue self stretching and AROM as much as possible.    PT Treatment/Interventions ADLs/Self Care Home Management;Aquatic Therapy;Cryotherapy;Electrical Stimulation;Moist Heat;Iontophoresis 4mg /ml Dexamethasone;Ultrasound;Gait training;Stair training;Functional mobility training;Therapeutic activities;Therapeutic exercise;Balance training;Neuromuscular re-education;Patient/family education;Manual techniques;Passive range of motion;Dry needling;Taping;Vasopneumatic Device    PT Next Visit Plan ankle ROM, have we confirmed  weight-bearing status with physician?? gait with LRAD; how is self ankle stretching going?    PT Home Exercise Plan Access Code [email or web address]    Consulted  and Agree with Plan of Care Patient             Patient will benefit from skilled therapeutic intervention in order to improve the following deficits and impairments:  Decreased range of motion, Difficulty walking, Abnormal gait, Decreased activity tolerance, Pain, Impaired perceived functional ability, Decreased balance, Decreased strength, Decreased mobility  Visit Diagnosis: Pain in right ankle and joints of right foot  Stiffness of right ankle, not elsewhere classified  Difficulty in walking, not elsewhere classified  Muscle weakness (generalized)     Problem List Patient Active Problem List   Diagnosis Date Noted   Pre-diabetes 11/02/2020   Vitamin D deficiency 11/02/2020   At risk for diabetes mellitus 11/02/2020   Acute intractable headache 02/23/2020   Dizziness and giddiness 02/23/2020   Cyst of bone of left hand 12/10/2019   Visit for routine gyn exam 07/30/2019   Desire for pregnancy 07/30/2019   Dislocation of knee, anterior, left, closed 01/21/2016   Closed anterior dislocation of left knee 01/21/2016   Depression 07/26/2014   Herpes labialis 07/03/2014   HIV disease (HCC) 04/23/2014   General counseling for prescription of oral contraceptives 06/11/2013   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   Obesity 12/22/2012    Sherrie Mustache, PTA 04/20/2021, 10:35 AM  [facility] [address] [place], [place], [address] Phone: [phone]   Fax:  [phone]  Name: BRITANEY ESPAILLAT MRN: [id number] Date of Birth: Aug 03, 1985

## 2021-04-22 ENCOUNTER — Ambulatory Visit: Payer: BC Managed Care – PPO

## 2021-04-26 ENCOUNTER — Other Ambulatory Visit: Payer: Self-pay

## 2021-04-26 ENCOUNTER — Ambulatory Visit: Payer: BC Managed Care – PPO | Attending: Orthopaedic Surgery

## 2021-04-26 DIAGNOSIS — M25571 Pain in right ankle and joints of right foot: Secondary | ICD-10-CM | POA: Diagnosis present

## 2021-04-26 DIAGNOSIS — M25671 Stiffness of right ankle, not elsewhere classified: Secondary | ICD-10-CM | POA: Diagnosis present

## 2021-04-26 DIAGNOSIS — R262 Difficulty in walking, not elsewhere classified: Secondary | ICD-10-CM | POA: Diagnosis present

## 2021-04-26 DIAGNOSIS — M6281 Muscle weakness (generalized): Secondary | ICD-10-CM | POA: Insufficient documentation

## 2021-04-27 NOTE — Therapy (Signed)
[facility] Outpatient Rehabilitation [facility] [address] [place], [place], [address] Phone: [phone]   Fax:  [phone]  Physical Therapy Treatment  Patient Details  Name: Brenda Tyler MRN: [id number] Date of Birth: [birth year] Referring Provider (PT): Terance Hart, MD   Encounter Date: 04/26/2021   PT End of Session - 04/27/21 0606     Visit Number 3    Number of Visits 17    Date for PT Re-Evaluation 06/10/21    Authorization Type BCBS primary, MCD secondary    PT Start Time 0936    PT Stop Time 1025    PT Time Calculation (min) 49 min    Equipment Utilized During Treatment Other (comment)   single curtch and R cam boot   Activity Tolerance Patient tolerated treatment well    Behavior During Therapy [facility] for tasks assessed/performed             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   Subjective Assessment - 04/26/21 0939     Subjective Pt reports her R ankle is not hurting alot, 5/10. Pt states she has been walking in her home some without the cam boot.    Diagnostic tests IMPRESSION:  1. Patchy areas of marrow edema involving the ankle, hindfoot and  midfoot. These could be bone contusions or stress reactions. No  discrete fracture. Other possibility would include disuse  osteoporosis or regional pain syndrome.  2. Complete tears of the anterior talofibular ligament and  calcaneofibular ligament.  The posterior talofibular ligament is  intact.  3. Mild tenosynovitis involving the posterior tibialis tendon.  4. 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    Patient Stated Goals to be able to walk and go back to work.    Currently in Pain? No/denies    Pain Score 5     Pain Location Ankle    Pain Orientation Right    Pain Descriptors / Indicators Sore;Tightness    Pain Type Other (Comment)   subacute   Pain Onset More than a month ago    Pain Frequency Intermittent    Aggravating Factors  weightbearing, walking    Pain Relieving Factors rest    Multiple Pain Sites No                               [facility] Adult PT Treatment/Exercise - 04/27/21 0001       Ambulation/Gait   Gait Comments one crutch and step through pattern - cues for technique      Self-Care   Other Self-Care Comments  self PROM 4 way  ankle      Manual Therapy   Manual therapy comments passive ankle ROM 4 way as  tolerated      Ankle Exercises: Stretches   Soleus Stretch Limitations seated with towel, x2, 30 sec    Gastroc Stretch Limitations seated with towel, x2, 30 sec      Ankle Exercises: Seated   Ankle Circles/Pumps 10 reps    Towel Crunch 5 reps   2 sets   Towel Inversion/Eversion 5 reps   2 sets   Heel Raises 15 reps    Toe Raise 15 reps                     PT Education - 04/27/21 0605     Education Details Pt advised not to walk without the cam boot until advised by Dr. Susa Simmonds. Pt was encouraged to use RICE to help with swelling and pain.    Person(s) Educated Patient    Methods Explanation    Comprehension Verbalized understanding              PT Short Term Goals - 04/12/21 1433       PT SHORT TERM GOAL #1   Title Patient will be independent with initial HEP.    Baseline issued at eval.    Status New    Target Date 04/26/21      PT SHORT TERM GOAL #2   Title Patient will demonstrate at least 5  degrees of Rt ankle inversion and eversion AROM to improve ability to navigate on uneven terrain.    Baseline see flowsheet    Status New    Target Date 05/10/21      PT SHORT TERM GOAL #3   Title Patient will ambulate with step through pattern utilizing LRAD once cleared by MD for weight-bearing.    Status New    Target Date 05/10/21               PT Long Term Goals - 04/12/21 1434       PT LONG TERM GOAL #1   Title Patient will demonstrate at least 4/5 strength about the Rt ankle to improve overall stability necessary for prolonged standing and walking.    Baseline not tested    Status New    Target Date 06/07/21      PT LONG TERM GOAL #2   Title Patient will maintain SLS for at least 5 seconds on the RLE to signify improved ankle stability for functional activities.    Baseline unable    Status New    Target Date 06/07/21      PT LONG TERM GOAL #3   Title Patient will demonstrate at least 5 degrees of ankle DF AROM to improve gait mechanics.    Baseline see flowsheet    Status New    Target Date 06/07/21      PT LONG TERM GOAL #4   Title Patient will tolerate at least 30 minutes of continous standing activity once cleared by MD for weight-bearing to improve her tolerance to work specific activity.    Status New    Target Date 06/07/21                   Plan - 04/27/21 0607     Clinical Impression Statement Pt presents to PT walking with 1 crutch and R cam boot. Pt's R ankle is swollen. Ankle movement of the R ankle were limited, but improved following PROM and stretching. A message with Dr. Donnie Mesa office was left for clarification of pt's weighting status and cam boot use. Pt  participated PT to improve R ankle mobility and strength. AROM of the R ankle is limited by swelling and pain. Pt tolerated without overall increase in R ankle pain above baseline at the beginning of the session. Pt's R ankle was elevated and a cold pack was applied for 10 mins for  symptom management.    Personal Factors and Comorbidities Time since onset of injury/illness/exacerbation;Comorbidity 1    Comorbidities HIV    Examination-Activity Limitations Caring for Others;Locomotion Level;Squat;Stairs;Stand;Bathing;Lift    Examination-Participation Restrictions Cleaning;Meal Prep;Shop;Driving;Occupation;Laundry    Stability/Clinical Decision Making Stable/Uncomplicated    Clinical Decision Making Low    Rehab Potential Good    PT Frequency 2x / week    PT Duration Other (comment)   6-8 weeks   PT Treatment/Interventions ADLs/Self Care Home Management;Aquatic Therapy;Cryotherapy;Electrical Stimulation;Moist Heat;Iontophoresis 4mg /ml Dexamethasone;Ultrasound;Gait training;Stair training;Functional mobility training;Therapeutic activities;Therapeutic exercise;Balance training;Neuromuscular re-education;Patient/family education;Manual techniques;Passive range of motion;Dry needling;Taping;Vasopneumatic Device    PT Next Visit Plan ankle ROM, have we confirmed weight-bearing status with physician?? gait with LRAD; how is self ankle stretching going?    PT Home Exercise Plan Access Code [email or web address]    Consulted and Agree with Plan of Care Patient             Patient will benefit from skilled therapeutic intervention in order to improve the following deficits and impairments:  Decreased range of motion, Difficulty walking, Abnormal gait, Decreased activity tolerance, Pain, Impaired perceived functional ability, Decreased balance, Decreased strength, Decreased mobility  Visit Diagnosis: Pain in right ankle and joints of right foot  Stiffness of right ankle, not elsewhere classified  Difficulty in walking, not elsewhere classified  Muscle weakness (generalized)     Problem List Patient Active Problem List   Diagnosis Date Noted   Pre-diabetes 11/02/2020   Vitamin D deficiency 11/02/2020   At risk for diabetes mellitus 11/02/2020   Acute intractable headache  02/23/2020   Dizziness and giddiness 02/23/2020   Cyst of bone of left hand 12/10/2019   Visit for routine gyn exam 07/30/2019   Desire for pregnancy 07/30/2019   Dislocation of knee, anterior, left, closed 01/21/2016   Closed anterior dislocation of left knee 01/21/2016   Depression 07/26/2014   Herpes labialis 07/03/2014   HIV disease (HCC) 04/23/2014   General counseling for prescription of oral contraceptives 06/11/2013   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 [place], [place], [place] Phone: [phone]   Fax:  [phone]  Name: Brenda Tyler MRN: [name] Date of Birth: 09/17/1985

## 2021-04-28 ENCOUNTER — Ambulatory Visit: Payer: BC Managed Care – PPO

## 2021-04-28 ENCOUNTER — Other Ambulatory Visit: Payer: Self-pay

## 2021-04-28 DIAGNOSIS — M6281 Muscle weakness (generalized): Secondary | ICD-10-CM

## 2021-04-28 DIAGNOSIS — R262 Difficulty in walking, not elsewhere classified: Secondary | ICD-10-CM

## 2021-04-28 DIAGNOSIS — M25571 Pain in right ankle and joints of right foot: Secondary | ICD-10-CM

## 2021-04-28 DIAGNOSIS — M25671 Stiffness of right ankle, not elsewhere classified: Secondary | ICD-10-CM

## 2021-04-29 NOTE — Therapy (Signed)
[facility] Outpatient Rehabilitation [facility] [address] [place], [place], [address] Phone: [phone]   Fax:  [phone]  Physical Therapy Treatment  Patient Details  Name: Brenda Tyler MRN: [id number] Date of Birth: Sep 18, 1985 Referring Provider (PT): Terance Hart, MD   Encounter Date: 04/28/2021   PT End of Session - 04/28/21 0935     Visit Number 4    Number of Visits 17    Date for PT Re-Evaluation 06/10/21    Authorization Type BCBS primary, MCD secondary    PT Start Time 0934    PT Stop Time 1025    PT Time Calculation (min) 51 min    Equipment Utilized During Treatment Other (comment)   cam boot   Activity Tolerance Patient tolerated treatment well    Behavior During Therapy [facility] for tasks assessed/performed             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   Subjective Assessment - 04/28/21 0940     Subjective Pt reports her R ankle/foot pain is better    Pertinent History HIV    Limitations Standing;Walking;House hold activities    Diagnostic tests IMPRESSION:  1. Patchy areas of marrow edema involving the ankle, hindfoot and  midfoot. These could be bone contusions or stress reactions. No  discrete fracture. Other possibility would include disuse  osteoporosis or regional pain syndrome.  2. Complete tears of the anterior talofibular ligament and  calcaneofibular ligament. The posterior  talofibular ligament is  intact.  3. Mild tenosynovitis involving the posterior tibialis tendon.  4. 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    Patient Stated Goals to be able to walk and go back to work.    Currently in Pain? Yes    Pain Score 4     Pain Location Ankle    Pain Orientation Right    Pain Descriptors / Indicators Sore;Tightness    Pain Type Other (Comment)   subacute   Pain Onset More than a month ago    Pain Frequency Intermittent                               [facility] Adult PT Treatment/Exercise - 04/29/21 0001       Ambulation/Gait   Gait Comments one crutch and step through pattern      Exercises   Exercises Ankle      Ankle Exercises: Stretches   Soleus Stretch Limitations seated with towel, x2, 30 sec    Gastroc Stretch Limitations seated with towel, x2, 30 sec      Ankle Exercises: Seated   Ankle Circles/Pumps 10 reps   each motion   Towel Crunch 5 reps   2 sets  Towel Inversion/Eversion 5 reps   2 sets   Marble Pickup 20    Heel Raises 15 reps    Toe Raise 15 reps      Ankle Exercises: Standing   Other Standing Ankle Exercises Weight shifting anterior/posterior and laterally 20x each at freemotion as needed for balance assist                       PT Short Term Goals - 04/12/21 1433       PT SHORT TERM GOAL #1   Title Patient will be independent with initial HEP.    Baseline issued at eval.    Status New    Target Date 04/26/21      PT SHORT TERM GOAL #2   Title Patient will demonstrate at least 5 degrees of Rt ankle inversion and eversion AROM to improve ability to navigate on uneven terrain.    Baseline see flowsheet    Status New    Target Date 05/10/21      PT SHORT TERM GOAL #3   Title Patient will ambulate with step through pattern utilizing LRAD once cleared by MD for weight-bearing.    Status New    Target Date 05/10/21                PT Long Term Goals - 04/29/21 1258       PT LONG TERM GOAL #3   Title Patient will demonstrate at least 5 degrees of ankle DF AROM to improve gait mechanics.  04/28/21: -5d AROM DF    Status On-going    Target Date 06/07/21                   Plan - 04/28/21 0936     Clinical Impression Statement PT was completed for ROM and strengthening of the R foot/ankle. Active movements were completed in the open chain or in sitting with feet on floor for minimized wt bearing. Exs for the foot were progressed to marble pickup. Pt was able to flex her toes eoungh to pick up the marbles. Additionally, weight shifting AP and laterally was completed for motion and balance. Today, pt's AROM of her R foot/ankle was visibly improved and AROM for R DF = -5. A cold pack with elevation was aplied at the end of the session for symptom management for pain and swelling. Pt tolerated the session without adverse effects.    Personal Factors and Comorbidities Time since onset of injury/illness/exacerbation;Comorbidity 1    Comorbidities HIV    Examination-Activity Limitations Caring for Others;Locomotion Level;Squat;Stairs;Stand;Bathing;Lift    Examination-Participation Restrictions Cleaning;Meal Prep;Shop;Driving;Occupation;Laundry    Stability/Clinical Decision Making Stable/Uncomplicated    Clinical Decision Making Low    Rehab Potential Good    PT Frequency 2x / week    PT Duration Other (comment)   6-8 weeks   PT Treatment/Interventions ADLs/Self Care Home Management;Aquatic Therapy;Cryotherapy;Electrical Stimulation;Moist Heat;Iontophoresis 4mg /ml Dexamethasone;Ultrasound;Gait training;Stair training;Functional mobility training;Therapeutic activities;Therapeutic exercise;Balance training;Neuromuscular re-education;Patient/family education;Manual techniques;Passive range of motion;Dry needling;Taping;Vasopneumatic Device    PT Next Visit Plan ankle ROM, have we confirmed weight-bearing status with  physician?? gait with LRAD; how is self ankle stretching going?    PT Home Exercise Plan Access Code [email or web address]    Recommended Other Services After PT session was completed, received a VM from [place] at Dr. [place] informing PT, the pt's wt bearing staus is WBAT and she may use the cam boot as needed.    Consulted and Agree with Plan  of Care Patient             Patient will benefit from skilled therapeutic intervention in order to improve the following deficits and impairments:  Decreased range of motion, Difficulty walking, Abnormal gait, Decreased activity tolerance, Pain, Impaired perceived functional ability, Decreased balance, Decreased strength, Decreased mobility  Visit Diagnosis: Pain in right ankle and joints of right foot  Stiffness of right ankle, not elsewhere classified  Difficulty in walking, not elsewhere classified  Muscle weakness (generalized)     Problem List Patient Active Problem List   Diagnosis Date Noted   Pre-diabetes 11/02/2020   Vitamin D deficiency 11/02/2020   At risk for diabetes mellitus 11/02/2020   Acute intractable headache 02/23/2020   Dizziness and giddiness 02/23/2020   Cyst of bone of left hand 12/10/2019   Visit for routine gyn exam 07/30/2019   Desire for pregnancy 07/30/2019   Dislocation of knee, anterior, left, closed 01/21/2016   Closed anterior dislocation of left knee 01/21/2016   Depression 07/26/2014   Herpes labialis 07/03/2014   HIV disease (HCC) 04/23/2014   General counseling for prescription of oral contraceptives 06/11/2013   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

## 2021-05-01 ENCOUNTER — Ambulatory Visit: Payer: BC Managed Care – PPO

## 2021-05-01 ENCOUNTER — Other Ambulatory Visit: Payer: Self-pay

## 2021-05-01 DIAGNOSIS — M25671 Stiffness of right ankle, not elsewhere classified: Secondary | ICD-10-CM

## 2021-05-01 DIAGNOSIS — R262 Difficulty in walking, not elsewhere classified: Secondary | ICD-10-CM

## 2021-05-01 DIAGNOSIS — M25571 Pain in right ankle and joints of right foot: Secondary | ICD-10-CM

## 2021-05-01 DIAGNOSIS — M6281 Muscle weakness (generalized): Secondary | ICD-10-CM

## 2021-05-01 NOTE — Therapy (Addendum)
[place], [place], [address] Phone: [phone]   Fax:  [phone]  Physical Therapy Treatment  Patient Details  Name: Brenda Tyler MRN: [id number] Date of Birth: [birth year] Referring Provider (PT): Erle Crocker, MD   Encounter Date: 05/01/2021   PT End of Session - 05/01/21 1130     Visit Number 5    Number of Visits 17    Date for PT Re-Evaluation 06/10/21    Authorization Type BCBS primary, MCD secondary    PT Start Time 1015    PT Stop Time 1104   10 minutes on gameready   PT Time Calculation (min) 49 min    Equipment Utilized During Treatment Other (comment)   cam boot and single axillary crutch   Activity Tolerance Patient tolerated treatment well    Behavior During Therapy Flat affect             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 on pap 4/15 - s/p Colpo 11/02/13 - CIN II -->    HIV (human immunodeficiency virus infection) ([place])    HSV (herpes simplex virus) infection    Obesity    Oropharyngeal candidiasis 07/05/2014   Other fatigue    Shortness of breath on exertion     Past Surgical History:  Procedure Laterality Date   INCISION AND DRAINAGE ABSCESS     on abdomen and buttocks    There were no vitals filed for this visit.   Subjective Assessment - 05/01/21 1118     Subjective Pt reports she currently has no pain, but that her ankle bothers her throughout the day because she walks on it alot. Pt says she uses her CAM boot most of the time but sometimes walks without her boot. Pt says she walks without her CAM boot when getting up to go to the bathroom in the middle of the night. Pt says it hurts when walking on it without the boot but that the pain subsides when she lays down. Pt denies having issues with her sleep due to pain. Pt  says she has been doing her HEP and denies pain with HEP just feelings of a "stretch".    Pertinent History HIV    Limitations Standing;Walking;House hold activities    Diagnostic tests IMPRESSION:  1. Patchy areas of marrow edema involving the ankle, hindfoot and  midfoot. These could be bone contusions or stress reactions. No  discrete fracture. Other possibility would include disuse  osteoporosis or regional pain syndrome.  2. Complete tears of the anterior talofibular ligament and  calcaneofibular ligament. The posterior talofibular ligament is  intact.  3. Mild tenosynovitis involving the posterior tibialis tendon.  4. 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    Patient Stated Goals to be able to walk and go back to work.    Currently in Pain? No/denies    Pain Onset --                [facility] PT Assessment - 05/01/21 0001       Precautions   Precaution Comments WBAT without CAM boot per conversation with Dr. Lucia Gaskins.      AROM   Right Ankle Inversion 4   seated EOB OKC   Right  Ankle Eversion 20   seated EOB, OKC     PROM   Right Ankle Inversion --    Right Ankle Eversion --                           [facility] Adult PT Treatment/Exercise - 05/01/21 0001       Ambulation/Gait   Gait Comments single axillary crutch and step through pattern   with CAM boot     Modalities   Modalities Vasopneumatic   elevated,  supine     Vasopneumatic   Number Minutes Vasopneumatic  10 minutes    Vasopnuematic Location  Ankle    Vasopneumatic Pressure Medium    Vasopneumatic Temperature  46      Manual Therapy   Manual therapy comments passive ankle ROM 4 way as tolerated supine      Ankle Exercises: Seated   Ankle Circles/Pumps 10 reps   each motion   Towel Inversion/Eversion Other (comment)   15 reps 2 seats   Heel Raises 15 reps    Toe Raise 15 reps    Other Seated Ankle Exercises sit to stand 2x10      Ankle  Exercises: Stretches   Gastroc Stretch Limitations seated with towel, x3, 30 sec      Ankle Exercises: Standing   Other Standing Ankle Exercises Weight shifting anterior/posterior 20x each at freemotion as needed for balance assist                     PT Education - 05/01/21 1123     Education Details Pt advised on updated precautions that she is WBAT per Dr. Lucia Gaskins. Pt encouraged to walk without CAM boot as tolerated throughout the day. Pt also encouraged that minimal to moderate pain 4-5/10 is okay, but that it should not last more than 72 hours and to use RICE to help pain and swelling.    Person(s) Educated Patient    Methods Explanation    Comprehension Verbalized understanding              PT Short Term Goals - 05/01/21 1144       PT SHORT TERM GOAL #1   Title Patient will be independent with initial HEP.    Baseline issued at eval.    Status Achieved    Target Date 04/26/21      PT SHORT TERM GOAL #2   Title Patient will demonstrate at least 5 degrees of Rt ankle inversion and eversion AROM to improve ability to navigate on uneven terrain.    Baseline see flowsheet    Status Partially Met    Target Date 05/10/21      PT SHORT TERM GOAL #3   Title Patient will ambulate with step through pattern utilizing LRAD once cleared by MD for weight-bearing.    Status Achieved    Target Date 05/10/21               PT Long Term Goals - 04/29/21 1258       PT LONG TERM GOAL #3   Title Patient will demonstrate at least 5 degrees of ankle DF AROM to improve gait mechanics.  04/28/21: -5d AROM DF    Status On-going    Target Date 06/07/21                   Plan - 05/01/21 1132     Clinical Impression Statement Patient presents with  palpable edema and palpable stiffness of ankle joint  into PROM dorsiflexion, plantarflexion, inversion, and eversion with minimal reported tenderness in these directions and feelings of "stretch". Decreased palpable  tightness noted following PROM in all directions with reports of decreased soreness as well. Patient able to demonstrate and verbally recall HEP indicating independance with exercises at home. Weight shifts without CAM boot were tolerated well with pt reported it felt "worse" with Rt foot back with reports of "stretch" as well. Pt able to tolerate sit to stands without a CAM boot with moderate pain reported in Rt ankle that subsided with active rest. Patient gave good effort with all activities. Able to progress  today to include closed chain weight bearing exercises like sit to stand and weight shifts without a CAM boot with minimal reports of pain that subsided with rest. Pt able to increase AROM of ankle into inversion and eversion, see flowsheet. Minimal cues given throughout all activities per procedure list for proper form and technique. Decreased soreness reported with use of Gameready at the end of session - skin WNL after use of modalities and decrease in edema around lateral malleolus.    Personal Factors and Comorbidities Time since onset of injury/illness/exacerbation;Comorbidity 1    Comorbidities HIV    Examination-Activity Limitations Caring for Others;Locomotion Level;Squat;Stairs;Stand;Bathing;Lift    Examination-Participation Restrictions Cleaning;Meal Prep;Shop;Driving;Occupation;Laundry    Stability/Clinical Decision Making Stable/Uncomplicated    Rehab Potential Good    PT Frequency 2x / week    PT Duration Other (comment)   6-8 weeks   PT Treatment/Interventions ADLs/Self Care Home Management;Aquatic Therapy;Cryotherapy;Electrical Stimulation;Moist Heat;Iontophoresis 66m/ml Dexamethasone;Ultrasound;Gait training;Stair training;Functional mobility training;Therapeutic activities;Therapeutic exercise;Balance training;Neuromuscular re-education;Patient/family education;Manual techniques;Passive range of motion;Dry needling;Taping;Vasopneumatic Device    PT Next Visit Plan Assess gait  with LRAD and discuss pain management techniques. End with gameready due to decreased edema and patient response from this session. Progress HEP; discuss pts preference of aquatic therapy and assess need for possible referral to aquatic therapy.    PT Home Exercise Plan Access Code [email or web address]    [email or web address] Agree with Plan of Care Patient             Patient will benefit from skilled therapeutic intervention in order to improve the following deficits and impairments:  Decreased range of motion, Difficulty walking, Abnormal gait, Decreased activity tolerance, Pain, Impaired perceived functional ability, Decreased balance, Decreased strength, Decreased mobility  Visit Diagnosis: No diagnosis found.     Problem List Patient Active Problem List   Diagnosis Date Noted   Pre-diabetes 11/02/2020   Vitamin D deficiency 11/02/2020   At risk for diabetes mellitus 11/02/2020   Acute intractable headache 02/23/2020   Dizziness and giddiness 02/23/2020   Cyst of bone of left hand 12/10/2019   Visit for routine gyn exam 07/30/2019   Desire for pregnancy 07/30/2019   Dislocation of knee, anterior, left, closed 01/21/2016   Closed anterior dislocation of left knee 01/21/2016   Depression 07/26/2014   Herpes labialis 07/03/2014   HIV disease ([place] 04/23/2014   General counseling for prescription of oral contraceptives 06/11/2013   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   Obesity 12/22/2012   AGlade Lloyd SPT 05/01/21 12:08 PM   [facility] [place] [address]Phone: [phone]  Fax:  [phone] Name: Brenda SIELERMRN: [id number] of Birth: [birth year]

## 2021-05-03 ENCOUNTER — Other Ambulatory Visit: Payer: Self-pay

## 2021-05-03 ENCOUNTER — Ambulatory Visit: Payer: BC Managed Care – PPO

## 2021-05-03 DIAGNOSIS — M25571 Pain in right ankle and joints of right foot: Secondary | ICD-10-CM | POA: Diagnosis not present

## 2021-05-03 DIAGNOSIS — R262 Difficulty in walking, not elsewhere classified: Secondary | ICD-10-CM

## 2021-05-03 DIAGNOSIS — M6281 Muscle weakness (generalized): Secondary | ICD-10-CM

## 2021-05-03 DIAGNOSIS — M25671 Stiffness of right ankle, not elsewhere classified: Secondary | ICD-10-CM

## 2021-05-03 NOTE — Therapy (Signed)
[place], [place], [address] Phone: [phone]   Fax:  [phone]  Physical Therapy Treatment  Patient Details  Name: Brenda Tyler MRN: [id number] Date of Birth: 1985-08-13 Referring Provider (PT): Erle Crocker, MD   Encounter Date: 05/03/2021   PT End of Session - 05/03/21 2126     Visit Number 6    Number of Visits 17    Date for PT Re-Evaluation 06/10/21    Authorization Type BCBS primary, MCD secondary    PT Start Time 1025    PT Stop Time 1104    PT Time Calculation (min) 39 min    Equipment Utilized During Treatment Other (comment)    Activity Tolerance Patient tolerated treatment well    Behavior During Therapy Flat affect             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 on pap 4/15 - s/p Colpo 11/02/13 - CIN II -->    HIV (human immunodeficiency virus infection) ([place])    HSV (herpes simplex virus) infection    Obesity    Oropharyngeal candidiasis 07/05/2014   Other fatigue    Shortness of breath on exertion     Past Surgical History:  Procedure Laterality Date   INCISION AND DRAINAGE ABSCESS     on abdomen and buttocks    There were no vitals filed for this visit.   Subjective Assessment - 05/03/21 1038     Subjective Ive been up on my feet this AM and my R foot/ankle is hurting.    Patient Stated Goals to be able to walk and go back to work.    Pain Score 5     Pain Location Ankle    Pain Orientation Right    Pain Descriptors / Indicators Aching;Tightness    Pain Type --   subacute   Pain Onset More than a month ago    Pain Frequency Intermittent    Aggravating Factors  weightbearing, walking    Pain Relieving Factors Rest                               [facility] Adult PT Treatment/Exercise - 05/03/21  0001       Ambulation/Gait   Gait Comments single axillary crutch and step through pattern   with CAM boot     Manual Therapy   Manual therapy comments passive ankle ROM 4 way as tolerated supine      Ankle Exercises: Seated   ABC's 1 rep   A-Z   Ankle Circles/Pumps 10 reps   each motion     Ankle Exercises: Stretches   Gastroc Stretch 3 reps;20 seconds    Gastroc Stretch Limitations seated with towel, x3, 30 sec      Ankle Exercises: Standing   Other Standing Ankle Exercises At counter without cam boot with 2 hand A. Weight shifting laterally 20x. A/P c R foot forward, then L foot forward      Ankle Exercises: Supine   T-Band Long sit: 4 way ankle YTB, x10                     PT Education - 05/03/21 2221     Education Details Updated HEP  for r ankle strengthening    Person(s) Educated Patient    Methods Explanation;Demonstration;Tactile cues;Verbal cues;Handout    Comprehension Verbalized understanding;Returned demonstration;Verbal cues required;Tactile cues required              PT Short Term Goals - 05/01/21 1144       PT SHORT TERM GOAL #1   Title Patient will be independent with initial HEP.    Baseline issued at eval.    Status Achieved    Target Date 04/26/21      PT SHORT TERM GOAL #2   Title Patient will demonstrate at least 5 degrees of Rt ankle inversion and eversion AROM to improve ability to navigate on uneven terrain.    Baseline see flowsheet    Status Partially Met    Target Date 05/10/21      PT SHORT TERM GOAL #3   Title Patient will ambulate with step through pattern utilizing LRAD once cleared by MD for weight-bearing.    Status Achieved    Target Date 05/10/21               PT Long Term Goals - 04/29/21 1258       PT LONG TERM GOAL #3   Title Patient will demonstrate at least 5 degrees of ankle DF AROM to improve gait mechanics.  04/28/21: -5d AROM DF    Status On-going    Target Date 06/07/21                    Plan - 05/03/21 2127     Clinical Impression Statement Pt paricipated in PT to to improve R ankle/foot mobility and strength. Theraband strengthening exs were started and added to the pt's HEP. Also 2 footed wt shifting exs were completed as tolerated with the cam boot off. Pt returned demonstration of the theraband exs. Pt tolerated the session without adverse effects.    Personal Factors and Comorbidities Time since onset of injury/illness/exacerbation;Comorbidity 1    Comorbidities HIV    Examination-Activity Limitations Caring for Others;Locomotion Level;Squat;Stairs;Stand;Bathing;Lift    Examination-Participation Restrictions Cleaning;Meal Prep;Shop;Driving;Occupation;Laundry    Stability/Clinical Decision Making Stable/Uncomplicated    Clinical Decision Making Low    Rehab Potential Good    PT Frequency 2x / week    PT Duration Other (comment)   6-8 weeks   PT Treatment/Interventions ADLs/Self Care Home Management;Aquatic Therapy;Cryotherapy;Electrical Stimulation;Moist Heat;Iontophoresis 65m/ml Dexamethasone;Ultrasound;Gait training;Stair training;Functional mobility training;Therapeutic activities;Therapeutic exercise;Balance training;Neuromuscular re-education;Patient/family education;Manual techniques;Passive range of motion;Dry needling;Taping;Vasopneumatic Device    PT Next Visit Plan Assess gait with LRAD and discuss pain management techniques. End with gameready due to decreased edema and patient response from this session. Progress HEP; discuss pts preference of aquatic therapy and assess need for possible referral to aquatic therapy.    PT Home Exercise Plan Access Code [email or web address]    [email or web address] Agree with Plan of Care Patient             Patient will benefit from skilled therapeutic intervention in order to improve the following deficits and impairments:  Decreased range of motion, Difficulty walking, Abnormal gait, Decreased activity tolerance, Pain, Impaired  perceived functional ability, Decreased balance, Decreased strength, Decreased mobility  Visit Diagnosis: Pain in right ankle and joints of right foot  Stiffness of right ankle, not elsewhere classified  Difficulty in walking, not elsewhere classified  Muscle weakness (generalized)     Problem List Patient Active Problem List   Diagnosis Date Noted   Pre-diabetes 11/02/2020   Vitamin  D deficiency 11/02/2020   At risk for diabetes mellitus 11/02/2020   Acute intractable headache 02/23/2020   Dizziness and giddiness 02/23/2020   Cyst of bone of left hand 12/10/2019   Visit for routine gyn exam 07/30/2019   Desire for pregnancy 07/30/2019   Dislocation of knee, anterior, left, closed 01/21/2016   Closed anterior dislocation of left knee 01/21/2016   Depression 07/26/2014   Herpes labialis 07/03/2014   HIV disease ([place]) 04/23/2014   General counseling for prescription of oral contraceptives 06/11/2013   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   Obesity 12/22/2012    Gar Ponto MS, PT 05/03/21 10:30 PM   [place], [place], [address] Phone: [phone]   Fax:  [phone]  Name: Brenda Tyler MRN: [id number] Date of Birth: [birth year]

## 2021-05-09 ENCOUNTER — Other Ambulatory Visit: Payer: Self-pay

## 2021-05-09 ENCOUNTER — Ambulatory Visit: Payer: BC Managed Care – PPO

## 2021-05-09 DIAGNOSIS — M25571 Pain in right ankle and joints of right foot: Secondary | ICD-10-CM | POA: Diagnosis not present

## 2021-05-09 DIAGNOSIS — M6281 Muscle weakness (generalized): Secondary | ICD-10-CM

## 2021-05-09 DIAGNOSIS — M25671 Stiffness of right ankle, not elsewhere classified: Secondary | ICD-10-CM

## 2021-05-09 DIAGNOSIS — R262 Difficulty in walking, not elsewhere classified: Secondary | ICD-10-CM

## 2021-05-09 NOTE — Therapy (Addendum)
[place], [place], [address] Phone: [phone]   Fax:  [phone]  Physical Therapy Treatment  Patient Details  Name: Brenda Tyler MRN: [id number] Date of Birth: June 20, 1986 Referring Provider (PT): Erle Crocker, MD   Encounter Date: 05/09/2021   PT End of Session - 05/09/21 1208     Visit Number 7    Number of Visits 17    Date for PT Re-Evaluation 06/10/21    Authorization Type BCBS primary, MCD secondary    PT Start Time 1111   session limited due to patient being late   PT Stop Time 1200   Gamready 10 minutes   PT Time Calculation (min) 49 min    Equipment Utilized During Treatment Other (comment)   CAM boot and single axillary crutch   Activity Tolerance Patient tolerated treatment well    Behavior During Therapy [facility] for tasks assessed/performed             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 on pap 4/15 - s/p Colpo 11/02/13 - CIN II -->    HIV (human immunodeficiency virus infection) ([place])    HSV (herpes simplex virus) infection    Obesity    Oropharyngeal candidiasis 07/05/2014   Other fatigue    Shortness of breath on exertion     Past Surgical History:  Procedure Laterality Date   INCISION AND DRAINAGE ABSCESS     on abdomen and buttocks    There were no vitals filed for this visit.   Subjective Assessment - 05/09/21 1112     Subjective Pt says that shes been able to cook, but it makes her foot hurt more. So her foot has hurt more than usual. Was able to stand for 10 minutes at home before she had to sit down due to pain.    Patient Stated Goals to be able to walk and go back to work.    Pain Score 0-No pain                [facility] PT Assessment - 05/09/21 0001       Strength   Right Hip Flexion 4+/5   seated   Right  Hip ABduction 3+/5   sidelying   Left Hip Flexion 4+/5   seated   Left Hip ABduction 3+/5   sidelying   Right Knee Flexion 4+/5   seated   Right Knee Extension 4+/5   seated   Left Knee Flexion 4+/5   seated   Left Knee Extension 4+/5   seated     Ambulation/Gait   Gait Comments --                           [facility] Adult PT Treatment/Exercise - 05/09/21 0001       Exercises   Other Exercises  mini squat at counter 2x15   Repeated with and without CAM boot     Knee/Hip Exercises: Sidelying   Hip ABduction Strengthening   1x10 sidelying, bilat, with CAM boot     Modalities   Modalities Vasopneumatic   elevated,  supine     Vasopneumatic   Number Minutes Vasopneumatic  10 minutes    Vasopnuematic Location  Ankle    Vasopneumatic Pressure Medium  Vasopneumatic Temperature  46      Ankle Exercises: Seated   ABC's --   A-Z   Ankle Circles/Pumps 10 reps   each motion, without CAM boot   Heel Raises 5 reps   without CAM boot     Ankle Exercises: Stretches   Gastroc Stretch 3 reps;20 seconds   without CAM boot standing   Gastroc Stretch Limitations --      Ankle Exercises: Standing   Other Standing Ankle Exercises At counter with cam boot with 2 hand A. Weight shifting laterally 20x. A/P c R foot forward, then L foot forward. Repeated without CAM boot.                     PT Education - 05/09/21 1217     Education Details Updated HEP for hip strengthening and weight bearing tasks.    Person(s) Educated Patient    Methods Explanation;Handout    Comprehension Verbalized understanding              PT Short Term Goals - 05/01/21 1144       PT SHORT TERM GOAL #1   Title Patient will be independent with initial HEP.    Baseline issued at eval.    Status Achieved    Target Date 04/26/21      PT SHORT TERM GOAL #2   Title Patient will demonstrate at least 5 degrees of Rt ankle inversion and eversion AROM to improve ability to navigate on  uneven terrain.    Baseline see flowsheet    Status Partially Met    Target Date 05/10/21      PT SHORT TERM GOAL #3   Title Patient will ambulate with step through pattern utilizing LRAD once cleared by MD for weight-bearing.    Status Achieved    Target Date 05/10/21               PT Long Term Goals - 04/29/21 1258       PT LONG TERM GOAL #3   Title Patient will demonstrate at least 5 degrees of ankle DF AROM to improve gait mechanics.  04/28/21: -5d AROM DF    Status On-going    Target Date 06/07/21                   Plan - 05/09/21 1200     Clinical Impression Statement Pt tolerated todays treatment session well today with no adverse effects. Pt hip strength was assessed and identified hip abductor weakness bilat. Pt tolerated all weight bearing exercises without CAM boot with moderate reports of "stiffness" and "weakness" in Rt leg and foot. Pt reports decreased soreness and improved "stiffness" at the end of sesssion. Skin WNL following Gameready at end of session.    Comorbidities HIV    Examination-Activity Limitations Caring for Others;Locomotion Level;Squat;Stairs;Stand;Bathing;Lift    Examination-Participation Restrictions Cleaning;Meal Prep;Shop;Driving;Occupation;Laundry    PT Duration --   6-8 weeks   PT Treatment/Interventions ADLs/Self Care Home Management;Aquatic Therapy;Cryotherapy;Electrical Stimulation;Moist Heat;Iontophoresis 33m/ml Dexamethasone;Ultrasound;Gait training;Stair training;Functional mobility training;Therapeutic activities;Therapeutic exercise;Balance training;Neuromuscular re-education;Patient/family education;Manual techniques;Passive range of motion;Dry needling;Taping;Vasopneumatic Device    PT Next Visit Plan Assess gait with LRAD and discuss pain management techniques. End with gameready due to decreased edema and patient response from this session. Progress HEP; discuss pts preference of aquatic therapy and assess need for possible  referral to aquatic therapy.    PT Home Exercise Plan Access Code [email or web address]    [email or web address] Agree with Plan  of Care Patient             Patient will benefit from skilled therapeutic intervention in order to improve the following deficits and impairments:  Decreased range of motion, Difficulty walking, Abnormal gait, Decreased activity tolerance, Pain, Impaired perceived functional ability, Decreased balance, Decreased strength, Decreased mobility  Visit Diagnosis: No diagnosis found.     Problem List Patient Active Problem List   Diagnosis Date Noted   Pre-diabetes 11/02/2020   Vitamin D deficiency 11/02/2020   At risk for diabetes mellitus 11/02/2020   Acute intractable headache 02/23/2020   Dizziness and giddiness 02/23/2020   Cyst of bone of left hand 12/10/2019   Visit for routine gyn exam 07/30/2019   Desire for pregnancy 07/30/2019   Dislocation of knee, anterior, left, closed 01/21/2016   Closed anterior dislocation of left knee 01/21/2016   Depression 07/26/2014   Herpes labialis 07/03/2014   HIV disease ([place]) 04/23/2014   General counseling for prescription of oral contraceptives 06/11/2013   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   Obesity 12/22/2012   Glade Lloyd, SPT 05/09/21 12:48 PM   [facility] [address] [place], [place], [address] Phone: [phone]   Fax:  [phone]  Name: Brenda Tyler MRN: [id number] Date of Birth: June 30, 1985

## 2021-05-11 ENCOUNTER — Ambulatory Visit: Payer: BC Managed Care – PPO

## 2021-05-11 ENCOUNTER — Other Ambulatory Visit: Payer: Self-pay

## 2021-05-11 DIAGNOSIS — M25671 Stiffness of right ankle, not elsewhere classified: Secondary | ICD-10-CM

## 2021-05-11 DIAGNOSIS — R262 Difficulty in walking, not elsewhere classified: Secondary | ICD-10-CM

## 2021-05-11 DIAGNOSIS — M6281 Muscle weakness (generalized): Secondary | ICD-10-CM

## 2021-05-11 DIAGNOSIS — M25571 Pain in right ankle and joints of right foot: Secondary | ICD-10-CM

## 2021-05-11 NOTE — Therapy (Addendum)
[place], [place], [address] Phone: [phone]   Fax:  [phone]  Physical Therapy Treatment  Patient Details  Name: Brenda Tyler MRN: [id number] Date of Birth: Aug 07, 1985 Referring Provider (PT): Erle Crocker, MD   Encounter Date: 05/11/2021   PT End of Session - 05/11/21 1113     Visit Number 8    Number of Visits 17    Date for PT Re-Evaluation 06/10/21    Authorization Type BCBS primary, MCD secondary    PT Start Time 1015    PT Stop Time 1110   10 minutes on gameready   PT Time Calculation (min) 55 min    Equipment Utilized During Treatment Other (comment)   CAM boot and single axillary crutch   Activity Tolerance Patient tolerated treatment well    Behavior During Therapy [facility] for tasks assessed/performed             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 on pap 4/15 - s/p Colpo 11/02/13 - CIN II -->    HIV (human immunodeficiency virus infection) ([place])    HSV (herpes simplex virus) infection    Obesity    Oropharyngeal candidiasis 07/05/2014   Other fatigue    Shortness of breath on exertion     Past Surgical History:  Procedure Laterality Date   INCISION AND DRAINAGE ABSCESS     on abdomen and buttocks    There were no vitals filed for this visit.   Subjective Assessment - 05/11/21 1107     Subjective Pt did not bring shoe for Rt foot today and says she is still doing stuff in her CAM boot at the house. Pt says she didn't understand what was wrong with her ankle except that "something on the side was injured".    Patient Stated Goals to be able to walk and go back to work.    Currently in Pain? No/denies                                        PT Education - 05/11/21 1111     Education Details  Discussed etiology and pathology of condition. Explained anatomy of the ankle, injured structures, surrounding stabilizing structures, and expected prognosis. Instructed to not use CAM boot at home, to wear her shoes more, and only wear CAM boot for long distance walking.    Person(s) Educated Patient    Methods Explanation;Demonstration    Comprehension Verbalized understanding           [place] Adult PT Treatment/Exercise:  Therapeutic Exercise: - Nustep L1 with UE and LE, 10 minutes w/o CAM boot for mobility of Rt ankle in gravity eliminated position  - standing calf stretch 3x30s w/o CAM boot  - rocker board DF seated with both feet and standing with Rt only 1x30 reps both w/o CAM boot  - BAPS board CW and CCW seated 1x30 Rt only w/o CAM boot  - Step onto 4inch step 2x2 both feet w/o CAM boot  - Side step 2x5 step to left and right w/o CAM boot  - sit to stand 1x12 w/o CAM boot  - supine bridges 1x12 w/o CAM boot    Manual  Therapy: - NA  Neuromuscular re-ed: - NA  Therapeutic Activity: - NA  Self-care/Home Management: - see pt education   Modalities:  - Gameready 37mnutes, ankle, 42degs supine with Rt elevated. Mod compression.     PT Short Term Goals - 05/01/21 1144       PT SHORT TERM GOAL #1   Title Patient will be independent with initial HEP.    Baseline issued at eval.    Status Achieved    Target Date 04/26/21      PT SHORT TERM GOAL #2   Title Patient will demonstrate at least 5 degrees of Rt ankle inversion and eversion AROM to improve ability to navigate on uneven terrain.    Baseline see flowsheet    Status Partially Met    Target Date 05/10/21      PT SHORT TERM GOAL #3   Title Patient will ambulate with step through pattern utilizing LRAD once cleared by MD for weight-bearing.    Status Achieved    Target Date 05/10/21               PT Long Term Goals - 04/29/21 1258       PT LONG TERM GOAL #3   Title Patient will demonstrate at  least 5 degrees of ankle DF AROM to improve gait mechanics.  04/28/21: -5d AROM DF    Status On-going    Target Date 06/07/21                   Plan - 05/11/21 1114     Clinical Impression Statement Pt tolerated todays treatment session well today with no adverse effects. Completed the entire session without the CAM boot. Able to progress CKC strengthening with minimal reports of pain and moderate reports of "stiffness" in Rt ankle. Pt continued to be limited in AROM of ankle and requires verbal and tactile cues for WB throughout the entire foot, not just lateral border. Pt require moderate cues for form and technique of trunk, hips, and knees during sit to stand and side stepping. Pt had antalgic gait throughout session. Skin WNL following Gamready at end of session.    Comorbidities HIV    Examination-Activity Limitations Caring for Others;Locomotion Level;Squat;Stairs;Stand;Bathing;Lift    Examination-Participation Restrictions Cleaning;Meal Prep;Shop;Driving;Occupation;Laundry    PT Duration --   6-8 weeks   PT Treatment/Interventions ADLs/Self Care Home Management;Aquatic Therapy;Cryotherapy;Electrical Stimulation;Moist Heat;Iontophoresis 416mml Dexamethasone;Ultrasound;Gait training;Stair training;Functional mobility training;Therapeutic activities;Therapeutic exercise;Balance training;Neuromuscular re-education;Patient/family education;Manual techniques;Passive range of motion;Dry needling;Taping;Vasopneumatic Device    PT Next Visit Plan Discuss pts preference of aquatic therapy and assess need for possible referral to aquatic therapy; Assess tolerance to WB with shoe. Consider taping for ankle stability and pain management.    PT Home Exercise Plan Access Code [email or web address]    [email or web address] Agree with Plan of Care Patient             Patient will benefit from skilled therapeutic intervention in order to improve the following deficits and impairments:  Decreased range of motion,  Difficulty walking, Abnormal gait, Decreased activity tolerance, Pain, Impaired perceived functional ability, Decreased balance, Decreased strength, Decreased mobility  Visit Diagnosis: No diagnosis found.     Problem List Patient Active Problem List   Diagnosis Date Noted   Pre-diabetes 11/02/2020   Vitamin D deficiency 11/02/2020   At risk for diabetes mellitus 11/02/2020   Acute intractable headache 02/23/2020   Dizziness and giddiness 02/23/2020   Cyst of bone of left hand 12/10/2019   Visit  for routine gyn exam 07/30/2019   Desire for pregnancy 07/30/2019   Dislocation of knee, anterior, left, closed 01/21/2016   Closed anterior dislocation of left knee 01/21/2016   Depression 07/26/2014   Herpes labialis 07/03/2014   HIV disease ([place]) 04/23/2014   General counseling for prescription of oral contraceptives 06/11/2013   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

## 2021-05-15 ENCOUNTER — Ambulatory Visit: Payer: BC Managed Care – PPO

## 2021-05-15 ENCOUNTER — Other Ambulatory Visit: Payer: Self-pay

## 2021-05-15 DIAGNOSIS — M25571 Pain in right ankle and joints of right foot: Secondary | ICD-10-CM | POA: Diagnosis not present

## 2021-05-15 DIAGNOSIS — M6281 Muscle weakness (generalized): Secondary | ICD-10-CM

## 2021-05-15 DIAGNOSIS — M25671 Stiffness of right ankle, not elsewhere classified: Secondary | ICD-10-CM

## 2021-05-15 DIAGNOSIS — R262 Difficulty in walking, not elsewhere classified: Secondary | ICD-10-CM

## 2021-05-15 NOTE — Therapy (Addendum)
[place], [place], [address] Phone: [phone]   Fax:  [phone]  Physical Therapy Treatment  Patient Details  Name: Brenda Tyler MRN: [id number] Date of Birth: 07-16-1985 Referring Provider (PT): Erle Crocker, MD   Encounter Date: 05/15/2021   PT End of Session - 05/15/21 1158     Visit Number 9    Number of Visits 17    Date for PT Re-Evaluation 06/10/21    Authorization Type BCBS primary, MCD secondary    PT Start Time 1021   session limited due to patient being late   PT Stop Time 1100    PT Time Calculation (min) 39 min    Equipment Utilized During Treatment Other (comment)   CAM boot and single axillary crutch   Activity Tolerance Patient tolerated treatment well;Patient limited by pain    Behavior During Therapy [facility] for tasks assessed/performed             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 on pap 4/15 - s/p Colpo 11/02/13 - CIN II -->    HIV (human immunodeficiency virus infection) ([place])    HSV (herpes simplex virus) infection    Obesity    Oropharyngeal candidiasis 07/05/2014   Other fatigue    Shortness of breath on exertion     Past Surgical History:  Procedure Laterality Date   INCISION AND DRAINAGE ABSCESS     on abdomen and buttocks    There were no vitals filed for this visit.   Subjective Assessment - 05/15/21 1024     Subjective Pt says she has stopped using the CAM boot more at home and has had an increase in pain. Pt says using her shoes to walk has felt better than walking barefoot around the house.    Patient Stated Goals to be able to walk and go back to work.    Currently in Pain? Yes    Pain Score 4     Pain Location Ankle    Pain Orientation Right    Pain Descriptors / Indicators Aching;Moaning;Grimacing                                       [facility] Adult PT Treatment/Exercise:   Gait Training:  - weight shifts against the wall 1x15 bilat with shoe  - weight shifts against the wall 1x10 with ankle tape and shoe  - walking in parallel bars w/ Rt ankle tape and shoe; VC for decreasing UE support and looking into mirror for visual feedback.   Therapeutic Exercise:  - standing hip abduction Rt LE only, 1x15 using mirror for visual feedback.  - standing knee flexion to table, VC for maximal DF as tolerated. SBA for balance.  - sit to stand from mat table  1x15 in shoe.       *not performed today:  - BAPS board CW and CCW seated 1x30 Rt only w/o CAM boot  - rocker board DF seated with both feet and standing with Rt only 1x30 reps both w/o CAM boot  - Nustep L1 with UE and LE, 10 minutes w/o CAM boot for mobility of Rt ankle in gravity eliminated position  - standing calf stretch  3x30s w/o CAM boot  - Step onto 4inch step 2x2 both feet w/o CAM boot  - Side step 2x5 step to left and right w/o CAM boot  - supine bridges 1x12 w/o CAM boot  Manual Therapy: - Ankle tape applied to Rt LE for improved subtalar joint stability in standing.    Neuromuscular re-ed: - NA   Therapeutic Activity: - NA   Self-care/Home Management: - see pt education    Modalities:  *not performed today  - Gameready 98mnutes, ankle, 42degs supine with Rt elevated. Mod compression.   PT Education - 05/15/21 1211     Education Details Discussed lace up ankle braces (types, where to purchase) and utility for improved WB without CAM boot.    Person(s) Educated Patient    Methods Explanation;Handout    Comprehension Verbalized understanding              PT Short Term Goals - 05/01/21 1144       PT SHORT TERM GOAL #1   Title Patient will be independent with initial HEP.    Baseline issued at eval.    Status Achieved    Target Date 04/26/21      PT SHORT TERM GOAL #2    Title Patient will demonstrate at least 5 degrees of Rt ankle inversion and eversion AROM to improve ability to navigate on uneven terrain.    Baseline see flowsheet    Status Partially Met    Target Date 05/10/21      PT SHORT TERM GOAL #3   Title Patient will ambulate with step through pattern utilizing LRAD once cleared by MD for weight-bearing.    Status Achieved    Target Date 05/10/21               PT Long Term Goals - 04/29/21 1258       PT LONG TERM GOAL #3   Title Patient will demonstrate at least 5 degrees of ankle DF AROM to improve gait mechanics.  04/28/21: -5d AROM DF    Status On-going    Target Date 06/07/21                   Plan - 05/15/21 1159     Clinical Impression Statement Pt tolerated todays treatment session well today with decreased pain at the end of the session. Able to progress gait focused interventions with minimal reports of pain and visual cues required for improved weight bearing during stance on Rt. Pt has significant pain with weight shifts at the wall in shoe, but reports decrease in pain with weight shifts following ankle tape. Pt able to walk out of clinic without using single axillary crutch or CAM boot. Pt instructed on buying an ankle lace up brace to attenuate response from ankle taping.    Comorbidities HIV    Examination-Activity Limitations Caring for Others;Locomotion Level;Squat;Stairs;Stand;Bathing;Lift    Examination-Participation Restrictions Cleaning;Meal Prep;Shop;Driving;Occupation;Laundry    PT Duration --   6-8 weeks   PT Treatment/Interventions ADLs/Self Care Home Management;Aquatic Therapy;Cryotherapy;Electrical Stimulation;Moist Heat;Iontophoresis 415mml Dexamethasone;Ultrasound;Gait training;Stair training;Functional mobility training;Therapeutic activities;Therapeutic exercise;Balance training;Neuromuscular re-education;Patient/family education;Manual techniques;Passive range of motion;Dry  needling;Taping;Vasopneumatic Device    PT Next Visit Plan Discuss pts preference of aquatic therapy and assess need for possible referral to aquatic therapy; Assess tolerance to WB with shoe. Asses taping for pain relief and improved stability.    PT Home Exercise Plan Access Code [email or web address]    [email or web address] Agree with Plan of Care Patient  Patient will benefit from skilled therapeutic intervention in order to improve the following deficits and impairments:  Decreased range of motion, Difficulty walking, Abnormal gait, Decreased activity tolerance, Pain, Impaired perceived functional ability, Decreased balance, Decreased strength, Decreased mobility  Visit Diagnosis: No diagnosis found.     Problem List Patient Active Problem List   Diagnosis Date Noted   Pre-diabetes 11/02/2020   Vitamin D deficiency 11/02/2020   At risk for diabetes mellitus 11/02/2020   Acute intractable headache 02/23/2020   Dizziness and giddiness 02/23/2020   Cyst of bone of left hand 12/10/2019   Visit for routine gyn exam 07/30/2019   Desire for pregnancy 07/30/2019   Dislocation of knee, anterior, left, closed 01/21/2016   Closed anterior dislocation of left knee 01/21/2016   Depression 07/26/2014   Herpes labialis 07/03/2014   HIV disease ([place]) 04/23/2014   General counseling for prescription of oral contraceptives 06/11/2013   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 [place], [place], [address] Phone: [phone]   Fax:  [phone]  Name: Brenda Tyler MRN: [id number] Date of Birth: 1986/01/07

## 2021-05-17 ENCOUNTER — Ambulatory Visit: Payer: BC Managed Care – PPO

## 2021-06-03 ENCOUNTER — Ambulatory Visit: Payer: BC Managed Care – PPO | Attending: Orthopaedic Surgery

## 2021-06-03 ENCOUNTER — Other Ambulatory Visit: Payer: Self-pay

## 2021-06-03 DIAGNOSIS — M6281 Muscle weakness (generalized): Secondary | ICD-10-CM | POA: Insufficient documentation

## 2021-06-03 DIAGNOSIS — M25571 Pain in right ankle and joints of right foot: Secondary | ICD-10-CM | POA: Diagnosis present

## 2021-06-03 DIAGNOSIS — M25671 Stiffness of right ankle, not elsewhere classified: Secondary | ICD-10-CM | POA: Diagnosis present

## 2021-06-03 DIAGNOSIS — R262 Difficulty in walking, not elsewhere classified: Secondary | ICD-10-CM | POA: Insufficient documentation

## 2021-06-03 NOTE — Therapy (Signed)
[place], [place], [address] Phone: [phone]   Fax:  [phone]  Physical Therapy Treatment  Patient Details  Name: Brenda Tyler MRN: [id number] Date of Birth: 04-30-1986 Referring Provider (PT): Erle Crocker, MD   Encounter Date: 06/03/2021   PT End of Session - 06/03/21 0856     Visit Number 10    Number of Visits 17    Date for PT Re-Evaluation 06/10/21    Authorization Type BCBS primary, MCD secondary    PT Start Time 0900    PT Stop Time 0954   10 minutes ice pack   PT Time Calculation (min) 54 min    Equipment Utilized During Treatment --    Activity Tolerance Patient tolerated treatment well    Behavior During Therapy [facility] for tasks assessed/performed             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 on pap 4/15 - s/p Colpo 11/02/13 - CIN II -->    HIV (human immunodeficiency virus infection) ([place])    HSV (herpes simplex virus) infection    Obesity    Oropharyngeal candidiasis 07/05/2014   Other fatigue    Shortness of breath on exertion     Past Surgical History:  Procedure Laterality Date   INCISION AND DRAINAGE ABSCESS     on abdomen and buttocks    There were no vitals filed for this visit.   Subjective Assessment - 06/03/21 0900     Subjective Patient says she has been wearing her shoe and her foot hurts so bad.    Currently in Pain? Yes    Pain Score 5     Pain Location Ankle    Pain Orientation Right    Pain Descriptors / Indicators Throbbing    Pain Type Chronic pain    Pain Onset More than a month ago    Pain Frequency Constant    Aggravating Factors  weightbearing, walking    Pain Relieving Factors rest             [facility] Adult PT Treatment/Exercise:      Therapeutic Exercise:   - 4 way ankle red band  RLE 1 x 10 each  - calf stretch 60 sec RLE  - forward step up on airex 1 x 10 each  - lateral step up on airex 2 x 10  - standing calf raise 1  x10  - issued red band for 4way ankle as part of HEP.      Self-care: - discussed proper weaning from the CAM boot with suggestion to complete household ambulation with tennis shoes and complete long distance community ambulation with her CAM boot to improve her overall tolerance to weight-bearing activity - discussed how to make an ice pack and frequency/duration to assist in pain and swelling reduction - recommendation for lace up ankle brace and where to purchase.   Modalities: Ice pack for 10 minutes to Rt ankle             [facility] PT Assessment - 06/03/21 0001       AROM   Right Ankle Dorsiflexion 5                           PT  Short Term Goals - 06/03/21 0926       PT SHORT TERM GOAL #1   Title Patient will be independent with initial HEP.    Baseline issued at eval.    Status Achieved    Target Date 04/26/21      PT SHORT TERM GOAL #2   Title Patient will demonstrate at least 5 degrees of Rt ankle inversion and eversion AROM to improve ability to navigate on uneven terrain.    Baseline see flowsheet    Status Partially Met    Target Date 05/10/21      PT SHORT TERM GOAL #3   Title Patient will ambulate with step through pattern utilizing LRAD once cleared by MD for weight-bearing.    Status Achieved    Target Date 05/10/21               PT Long Term Goals - 04/29/21 1258       PT LONG TERM GOAL #3   Title Patient will demonstrate at least 5 degrees of ankle DF AROM to improve gait mechanics.  04/28/21: -5d AROM DF    Status On-going    Target Date 06/07/21                   Plan - 06/03/21 6004     Clinical Impression Statement Time spent educating patient on appropriate weaning from cam boot as she reports having increased pain about the ankle since last session attributed  to completing all weight-bearing activity without external support. It was recommended rather than going completely without the CAM boot that she complete household ambulation without and community ambulation with to allow for improved tolerance to weight-bearing activity. Able to progress ankle strengthening and weight-bearing activity today with patient reporting moderate pain during session, though able to complete all prescribed ther ex. She demonstrates fair balance with standing activity requiring UE support with all activity.    PT Treatment/Interventions ADLs/Self Care Home Management;Aquatic Therapy;Cryotherapy;Electrical Stimulation;Moist Heat;Iontophoresis 24m/ml Dexamethasone;Ultrasound;Gait training;Stair training;Functional mobility training;Therapeutic activities;Therapeutic exercise;Balance training;Neuromuscular re-education;Patient/family education;Manual techniques;Passive range of motion;Dry needling;Taping;Vasopneumatic Device    PT Next Visit Plan continue strengthening and gait training    PT Home Exercise Plan Access Code [email or web address]    ELTRVUYEBand Agree with Plan of Care Patient             Patient will benefit from skilled therapeutic intervention in order to improve the following deficits and impairments:  Decreased range of motion, Difficulty walking, Abnormal gait, Decreased activity tolerance, Pain, Impaired perceived functional ability, Decreased balance, Decreased strength, Decreased mobility  Visit Diagnosis: Pain in right ankle and joints of right foot  Stiffness of right ankle, not elsewhere classified  Difficulty in walking, not elsewhere classified  Muscle weakness (generalized)     Problem List Patient Active Problem List   Diagnosis Date Noted   Pre-diabetes 11/02/2020   Vitamin D deficiency 11/02/2020   At risk for diabetes mellitus 11/02/2020   Acute intractable headache 02/23/2020   Dizziness and giddiness 02/23/2020   Cyst of bone of left  hand 12/10/2019   Visit for routine gyn exam 07/30/2019   Desire for pregnancy 07/30/2019   Dislocation of knee, anterior, left, closed 01/21/2016   Closed anterior dislocation of left knee 01/21/2016   Depression 07/26/2014   Herpes labialis 07/03/2014   HIV disease ([place] 04/23/2014   General counseling for prescription of oral contraceptives 06/11/2013   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   Obesity 12/22/2012   SGwendolyn Grant PT,  DPT, ATC 06/03/21 9:46 AM  [facility] [address] [place], [place], [address] Phone: [phone]   Fax:  [phone]  Name: KEILANA MORLOCK MRN: [id number] Date of Birth: [birth year]

## 2021-06-05 ENCOUNTER — Ambulatory Visit: Payer: BC Managed Care – PPO

## 2021-06-05 ENCOUNTER — Other Ambulatory Visit: Payer: Self-pay

## 2021-06-05 DIAGNOSIS — M6281 Muscle weakness (generalized): Secondary | ICD-10-CM

## 2021-06-05 DIAGNOSIS — M25571 Pain in right ankle and joints of right foot: Secondary | ICD-10-CM

## 2021-06-05 DIAGNOSIS — R262 Difficulty in walking, not elsewhere classified: Secondary | ICD-10-CM

## 2021-06-05 DIAGNOSIS — M25671 Stiffness of right ankle, not elsewhere classified: Secondary | ICD-10-CM
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
## 2021-06-12 ENCOUNTER — Ambulatory Visit: Payer: BC Managed Care – PPO

## 2021-06-12 ENCOUNTER — Other Ambulatory Visit: Payer: Self-pay

## 2021-06-12 DIAGNOSIS — M25671 Stiffness of right ankle, not elsewhere classified: Secondary | ICD-10-CM

## 2021-06-12 DIAGNOSIS — M6281 Muscle weakness (generalized): Secondary | ICD-10-CM

## 2021-06-12 DIAGNOSIS — R262 Difficulty in walking, not elsewhere classified: Secondary | ICD-10-CM

## 2021-06-12 DIAGNOSIS — M25571 Pain in right ankle and joints of right foot: Secondary | ICD-10-CM | POA: Diagnosis not present

## 2021-06-12 NOTE — Therapy (Signed)
[place], [place], [address] Phone: [phone]   Fax:  [phone]  Physical Therapy Treatment  Patient Details  Name: Brenda Tyler MRN: [id number] Date of Birth: [birth year] Referring Provider (PT): Erle Crocker, MD   Encounter Date: 06/12/2021   PT End of Session - 06/12/21 1048     Visit Number 12    Number of Visits 25    Date for PT Re-Evaluation 07/31/20    Authorization Type BCBS primary, MCD secondary    PT Start Time 1048    PT Stop Time 1132   addtional 10 min ice to R ankle post session   PT Time Calculation (min) 44 min    Activity Tolerance Patient limited by pain    Behavior During Therapy [facility] for tasks assessed/performed             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   Subjective Assessment - 06/12/21 1048     Subjective Pt presents to PT with continued reports of R foot/ankle stiffness, but decreased pain this morning. Pt has been compliant with her HEP with no adverse effect. She is ready to begin PT at this time.    Currently in Pain? No/denies    Pain Score 0-No pain           [facility] Adult PT Treatment/Exercise:   Therapeutic Exercise:  Exercise Bike lvl 2 x 3 min while taking subjective Step up 8in x 10 fwd ea Calf stretch 2x30" RLE Standing heel raise 2x15 4 way ankle red band RLE 2x10  Neuromuscular  re-ed: Tandem 2x30" ea Tadem walk x 3 laps in //  Modalities: Ice to L ankle x 10 min post session                             PT Short Term Goals - 06/03/21 0926       PT SHORT TERM GOAL #1   Title Patient will be independent with initial HEP.    Baseline issued at eval.    Status Achieved    Target Date 04/26/21      PT SHORT TERM GOAL #2   Title Patient will demonstrate at least 5 degrees of Rt ankle inversion and eversion AROM to improve ability to navigate on uneven terrain.    Baseline see flowsheet    Status Partially Met    Target Date 05/10/21      PT SHORT TERM GOAL #3   Title Patient will ambulate with step through pattern utilizing LRAD once cleared by MD for weight-bearing.    Status Achieved    Target Date 05/10/21               PT Long Term Goals - 06/05/21  [place] #1   Title Patient will demonstrate at least 4/5 strength about the Rt ankle to improve overall stability necessary for prolonged standing and walking.    Baseline see flowsheet    Time 8    Period Weeks    Status On-going    Target Date 07/31/21      PT LONG TERM GOAL #2   Title Patient will maintain SLS for at least 5 seconds on the RLE to signify improved ankle stability for functional activities.    Baseline unable    Time 8    Period Weeks    Status On-going    Target Date 07/31/21      PT LONG TERM GOAL #3   Title Patient will demonstrate at least 5 degrees of ankle DF AROM to improve gait mechanics.    Baseline see flowsheet; 2 deg on 06/05/21    Time 8    Period Weeks    Status On-going    Target Date 07/31/21      PT LONG TERM GOAL #4   Title Patient will tolerate at least 30 minutes of continous standing activity once cleared by MD for weight-bearing to improve her tolerance to work specific activity.    Baseline 20-25 min on 12/12    Time 8    Period Weeks    Status On-going    Target Date 07/31/21                    Plan - 06/12/21 1058     Clinical Impression Statement Pt was able to complete prescribed exercises, but noted increase in R ankle discomfort during session. Therapy todya focused on increasing distal R LE muscle strength and R ankle stability/proprioception in order to improve functional mobility. Pt continues to demonstrate gait deficits with decreased L anke DF and stance time on R LE. She conitnues to benefit from skilled PT services and should continue to be seen and progressed as tolerated.    PT Treatment/Interventions ADLs/Self Care Home Management;Aquatic Therapy;Cryotherapy;Electrical Stimulation;Moist Heat;Iontophoresis 23m/ml Dexamethasone;Ultrasound;Gait training;Stair training;Functional mobility training;Therapeutic activities;Therapeutic exercise;Balance training;Neuromuscular re-education;Patient/family education;Manual techniques;Passive range of motion;Dry needling;Taping;Vasopneumatic Device    PT Next Visit Plan continue strengthening and gait training    PT Home Exercise Plan Access Code [email or web address]             Patient will benefit from skilled therapeutic intervention in order to improve the following deficits and impairments:  Decreased range of motion, Difficulty walking, Abnormal gait, Decreased activity tolerance, Pain, Impaired perceived functional ability, Decreased balance, Decreased strength, Decreased mobility  Visit Diagnosis: Pain in right ankle and joints of right foot  Stiffness of right ankle, not elsewhere classified  Difficulty in walking, not elsewhere classified  Muscle weakness (generalized)     Problem List Patient Active Problem List   Diagnosis Date Noted   Pre-diabetes 11/02/2020   Vitamin D deficiency 11/02/2020   At risk for diabetes mellitus 11/02/2020   Acute intractable headache 02/23/2020   Dizziness and giddiness 02/23/2020   Cyst of bone of left hand 12/10/2019   Visit for routine gyn exam 07/30/2019   Desire for  pregnancy 07/30/2019   Dislocation of knee, anterior, left, closed 01/21/2016   Closed anterior dislocation of left knee 01/21/2016   Depression 07/26/2014   Herpes labialis 07/03/2014   HIV disease ([place] 04/23/2014   General counseling for prescription of oral contraceptives 06/11/2013   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 [place], [place], [address] Phone: [phone]   Fax:  [phone]  Name: YATZIL CLIPPINGER MRN: [id number] Date of Birth: 11-10-1985

## 2021-06-15 ENCOUNTER — Ambulatory Visit: Payer: BC Managed Care – PPO

## 2021-06-15 ENCOUNTER — Ambulatory Visit (INDEPENDENT_AMBULATORY_CARE_PROVIDER_SITE_OTHER): Payer: BC Managed Care – PPO | Admitting: Infectious Diseases

## 2021-06-15 ENCOUNTER — Encounter: Payer: Self-pay | Admitting: Infectious Diseases

## 2021-06-15 ENCOUNTER — Other Ambulatory Visit: Payer: Self-pay

## 2021-06-15 VITALS — BP 135/93 | HR 79 | Temp 97.7°F | Wt 313.0 lb

## 2021-06-15 DIAGNOSIS — Z23 Encounter for immunization: Secondary | ICD-10-CM | POA: Diagnosis not present

## 2021-06-15 DIAGNOSIS — B3731 Acute candidiasis of vulva and vagina: Secondary | ICD-10-CM

## 2021-06-15 DIAGNOSIS — Z9189 Other specified personal risk factors, not elsewhere classified: Secondary | ICD-10-CM | POA: Diagnosis not present

## 2021-06-15 DIAGNOSIS — Z79899 Other long term (current) drug therapy: Secondary | ICD-10-CM | POA: Diagnosis not present

## 2021-06-15 DIAGNOSIS — B001 Herpesviral vesicular dermatitis: Secondary | ICD-10-CM

## 2021-06-15 DIAGNOSIS — R262 Difficulty in walking, not elsewhere classified: Secondary | ICD-10-CM

## 2021-06-15 DIAGNOSIS — Z113 Encounter for screening for infections with a predominantly sexual mode of transmission: Secondary | ICD-10-CM

## 2021-06-15 DIAGNOSIS — M6281 Muscle weakness (generalized): Secondary | ICD-10-CM

## 2021-06-15 DIAGNOSIS — B20 Human immunodeficiency virus [HIV] disease: Secondary | ICD-10-CM

## 2021-06-15 DIAGNOSIS — M25571 Pain in right ankle and joints of right foot: Secondary | ICD-10-CM | POA: Diagnosis not present

## 2021-06-15 DIAGNOSIS — M25671 Stiffness of right ankle, not elsewhere classified: Secondary | ICD-10-CM

## 2021-06-15 MED ORDER — FLUCONAZOLE 150 MG PO TABS: 150.0000 mg | ORAL_TABLET | Freq: Once | ORAL | 0 refills | Status: AC

## 2021-06-15 NOTE — Assessment & Plan Note (Signed)
She has condoms. Encouraged to use.  Encouraged adherence.  Will refer her for PAP Check her labs today Flu vax today She refuses COVID vax rtc in 4 months.

## 2021-06-15 NOTE — Assessment & Plan Note (Signed)
Encouraged to f/u with PCP, encouraged to continue metformin.

## 2021-06-15 NOTE — Therapy (Signed)
[facility] [address] [place], [place], [address] Phone: [phone]   Fax:  [phone]  Physical Therapy Treatment  Patient Details  Name: Brenda Tyler MRN: [id number] Date of Birth: 01/14/1986 Referring Provider (PT): Terance Hart, MD   Encounter Date: 06/15/2021   PT End of Session - 06/15/21 1102     Visit Number 13    Number of Visits 25    Date for PT Re-Evaluation 07/31/20    Authorization Type BCBS primary, MCD secondary    PT Start Time 1102    PT Stop Time 1153   ice 10 minutes   PT Time Calculation (min) 51 min    Activity Tolerance Patient tolerated treatment well    Behavior During Therapy [facility] for tasks assessed/performed             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   Subjective Assessment - 06/15/21 1106     Subjective Patient reports the ankle feels the same. 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    Currently in Pain? Yes    Pain Score 4     Pain Location Ankle    Pain Orientation Right    Pain Descriptors / Indicators Aching    Pain Type Chronic pain    Pain Onset More than a month ago    Pain Frequency Intermittent    Aggravating Factors  weightbearing, walking    Pain Relieving Factors rest                [facility] PT  Assessment - 06/15/21 0001       AROM   Right Ankle Dorsiflexion 5           [facility] Adult PT Treatment/Exercise:   Therapeutic Exercise:  Calf stretch 60 sec on slant board Standing heel raise 2x15 Sit to stand 2 x 10     Neuromuscular re-ed: Semi- tandem ball toss to rebounder 2 x 10  Tadem walk fwd/bwd x 20 ft each  Marching on airex 2 x 10    Modalities: Ice to Rt ankle x 10 min post session                               PT Short Term Goals - 06/15/21 1139       PT SHORT TERM GOAL #1   Title Patient will be independent with initial HEP.    Baseline issued at eval.    Status Achieved    Target Date 04/26/21      PT SHORT TERM GOAL #2   Title Patient will demonstrate at least 5 degrees of  Rt ankle inversion and eversion AROM to improve ability to navigate on uneven terrain.    Baseline see flowsheet    Status Achieved    Target Date 05/10/21      PT SHORT TERM GOAL #3   Title Patient will ambulate with step through pattern utilizing LRAD once cleared by MD for weight-bearing.    Status Achieved    Target Date 05/10/21               PT Long Term Goals - 06/05/21 1538       PT LONG TERM GOAL #1   Title Patient will demonstrate at least 4/5 strength about the Rt ankle to improve overall stability necessary for prolonged standing and walking.    Baseline see flowsheet    Time 8    Period Weeks    Status On-going    Target Date 07/31/21      PT LONG TERM GOAL #2   Title Patient will maintain SLS for at least 5 seconds on the RLE to signify improved ankle stability for functional activities.    Baseline unable    Time 8    Period Weeks    Status On-going    Target Date 07/31/21      PT LONG TERM GOAL #3   Title Patient will demonstrate at least 5 degrees of ankle DF AROM to improve gait mechanics.    Baseline see flowsheet; 2 deg on 06/05/21    Time 8    Period Weeks    Status On-going    Target Date 07/31/21      PT  LONG TERM GOAL #4   Title Patient will tolerate at least 30 minutes of continous standing activity once cleared by MD for weight-bearing to improve her tolerance to work specific activity.    Baseline 20-25 min on 12/12    Time 8    Period Weeks    Status On-going    Target Date 07/31/21                   Plan - 06/15/21 1120     Clinical Impression Statement Overall good tolerance to today's session focusing on CKC strengthening. She has difficulty controlling knee hyperextension during standing activity requiring consistent cues to adjust. With sit to stand she requires initial cues for proper alignment as she has significant knee valgus during the descent. She demonstrates fair postural control with dynamic balance activity having to utlize stepping and reaching strategy multiple times to maintain balance.    PT Treatment/Interventions ADLs/Self Care Home Management;Aquatic Therapy;Cryotherapy;Electrical Stimulation;Moist Heat;Iontophoresis 4mg /ml Dexamethasone;Ultrasound;Gait training;Stair training;Functional mobility training;Therapeutic activities;Therapeutic exercise;Balance training;Neuromuscular re-education;Patient/family education;Manual techniques;Passive range of motion;Dry needling;Taping;Vasopneumatic Device    PT Next Visit Plan continue strengthening and gait training    PT Home Exercise Plan Access Code [email or web address]             Patient will benefit from skilled therapeutic intervention in order to improve the following deficits and impairments:  Decreased range of motion, Difficulty walking, Abnormal gait, Decreased activity tolerance, Pain, Impaired perceived functional ability, Decreased balance, Decreased strength, Decreased mobility  Visit Diagnosis: Pain in right ankle and joints of right foot  Stiffness of right ankle, not elsewhere classified  Difficulty in walking, not elsewhere classified  Muscle weakness (generalized)     Problem List Patient  Active Problem List   Diagnosis Date Noted   Pre-diabetes 11/02/2020   Vitamin D deficiency 11/02/2020   At risk for diabetes mellitus 11/02/2020  Acute intractable headache 02/23/2020   Dizziness and giddiness 02/23/2020   Cyst of bone of left hand 12/10/2019   Visit for routine gyn exam 07/30/2019   Desire for pregnancy 07/30/2019   Dislocation of knee, anterior, left, closed 01/21/2016   Closed anterior dislocation of left knee 01/21/2016   Depression 07/26/2014   Herpes labialis 07/03/2014   HIV disease ([facility]) 04/23/2014   General counseling for prescription of oral contraceptives 06/11/2013   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   Obesity 12/22/2012   Letitia Libra, PT, DPT, ATC 06/15/21 11:44 AM   [facility] [address] [place], [place], [address] Phone: [phone]   Fax:  [phone]  Name: Brenda Tyler MRN: [id number] Date of Birth: July 24, 1985
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
## 2021-06-15 NOTE — Assessment & Plan Note (Signed)
I encouraged her to come to clinic for swab when she has active lesions.

## 2021-06-16 LAB — T-HELPER CELL (CD4) - (RCID CLINIC ONLY)
CD4 % Helper T Cell: 32 % — ABNORMAL LOW (ref 33–65)
CD4 T Cell Abs: 392 /uL — ABNORMAL LOW (ref 400–1790)

## 2021-06-18 LAB — COMPREHENSIVE METABOLIC PANEL
AG Ratio: 1 (calc) (ref 1.0–2.5)
ALT: 11 U/L (ref 6–29)
AST: 11 U/L (ref 10–30)
Albumin: 4 g/dL (ref 3.6–5.1)
Alkaline phosphatase (APISO): 135 U/L — ABNORMAL HIGH (ref 31–125)
BUN: 10 mg/dL (ref 7–25)
CO2: 27 mmol/L (ref 20–32)
Calcium: 9.4 mg/dL (ref 8.6–10.2)
Chloride: 104 mmol/L (ref 98–110)
Creat: 0.8 mg/dL (ref 0.50–0.97)
Globulin: 4 g/dL (calc) — ABNORMAL HIGH (ref 1.9–3.7)
Glucose, Bld: 90 mg/dL (ref 65–99)
Potassium: 3.7 mmol/L (ref 3.5–5.3)
Sodium: 139 mmol/L (ref 135–146)
Total Bilirubin: 0.5 mg/dL (ref 0.2–1.2)
Total Protein: 8 g/dL (ref 6.1–8.1)

## 2021-06-18 LAB — CBC
HCT: 40.3 % (ref 35.0–45.0)
Hemoglobin: 13.8 g/dL (ref 11.7–15.5)
MCH: 29.9 pg (ref 27.0–33.0)
MCHC: 34.2 g/dL (ref 32.0–36.0)
MCV: 87.4 fL (ref 80.0–100.0)
MPV: 10.5 fL (ref 7.5–12.5)
Platelets: 343 10*3/uL (ref 140–400)
RBC: 4.61 10*6/uL (ref 3.80–5.10)
RDW: 12.4 % (ref 11.0–15.0)
WBC: 3.8 10*3/uL (ref 3.8–10.8)

## 2021-06-18 LAB — HEMOGLOBIN A1C
Hgb A1c MFr Bld: 5.3 % of total Hgb (ref <5.7)
Mean Plasma Glucose: 105 mg/dL
eAG (mmol/L): 5.8 mmol/L

## 2021-06-18 LAB — HIV-1 RNA QUANT-NO REFLEX-BLD
HIV 1 RNA Quant: <20 Copies/mL — ABNORMAL HIGH
HIV-1 RNA Quant, Log: <1.3 Log cps/mL — ABNORMAL HIGH

## 2021-06-21 ENCOUNTER — Ambulatory Visit: Payer: BC Managed Care – PPO

## 2021-06-21 ENCOUNTER — Other Ambulatory Visit: Payer: Self-pay

## 2021-06-21 DIAGNOSIS — M25571 Pain in right ankle and joints of right foot: Secondary | ICD-10-CM | POA: Diagnosis not present

## 2021-06-21 DIAGNOSIS — M6281 Muscle weakness (generalized): Secondary | ICD-10-CM

## 2021-06-21 DIAGNOSIS — M25671 Stiffness of right ankle, not elsewhere classified: Secondary | ICD-10-CM

## 2021-06-21 DIAGNOSIS — R262 Difficulty in walking, not elsewhere classified: Secondary | ICD-10-CM

## 2021-06-21 NOTE — Therapy (Signed)
[facility] Outpatient Rehabilitation [facility] [address] [place], [place], [address] Phone: [phone]   Fax:  [phone]  Physical Therapy Treatment  Patient Details  Name: Brenda Tyler MRN: [id number] Date of Birth: 04-Aug-1985 Referring Provider (PT): Terance Hart, MD   Encounter Date: 06/21/2021   PT End of Session - 06/21/21 1615     Visit Number 14    Number of Visits 25    Date for PT Re-Evaluation 07/31/20    Authorization Type BCBS primary, MCD secondary    PT Start Time 1613    PT Stop Time 1655    PT Time Calculation (min) 42 min    Activity Tolerance Patient tolerated treatment well    Behavior During Therapy [facility] for tasks assessed/performed             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   Subjective Assessment - 06/21/21 1615     Subjective Pt presents to PT with no current reports of pain or discomfort. Has been compliant with HEP with no adverse effect. Pt is ready to begin PT at this time.    Currently in Pain? No/denies    Pain Score 0-No pain           [facility] Adult PT Treatment/Exercise:   Therapeutic Exercise:  NuStep lvl 6 LE only  Slant board 2x45" Standing heel raise 2x15 Sit to stand 2x10 R ankle 4-way 2x10 RTB   Neuromuscular re-ed: Semi-tandem ball toss to rebounder 2 x 15 (yellow ball) Tadem walk fwd/bwd x 2 laps in // Marching on airex 2x10 Heel toe on  airex x 20  Rocker board fwd/bwd x 15  Manual Therapy: Grade III AP and lateral glides to R ankle Distraction grade III to R ankle PROM into R ankle DF, Inv, Ev                               PT Short Term Goals - 06/15/21 1139       PT SHORT TERM GOAL #1   Title Patient will be independent with initial HEP.    Baseline issued at eval.    Status Achieved    Target Date 04/26/21      PT SHORT TERM GOAL #2   Title Patient will demonstrate at least 5 degrees of Rt ankle inversion and eversion AROM to improve ability to navigate on uneven terrain.    Baseline see flowsheet    Status Achieved    Target Date 05/10/21      PT SHORT TERM GOAL #3   Title Patient will ambulate with step through pattern utilizing LRAD once cleared by MD for weight-bearing.    Status Achieved    Target Date 05/10/21  PT Long Term Goals - 06/05/21 1538       PT LONG TERM GOAL #1   Title Patient will demonstrate at least 4/5 strength about the Rt ankle to improve overall stability necessary for prolonged standing and walking.    Baseline see flowsheet    Time 8    Period Weeks    Status On-going    Target Date 07/31/21      PT LONG TERM GOAL #2   Title Patient will maintain SLS for at least 5 seconds on the RLE to signify improved ankle stability for functional activities.    Baseline unable    Time 8    Period Weeks    Status On-going    Target Date 07/31/21      PT LONG TERM GOAL #3   Title Patient will demonstrate at least 5 degrees of ankle DF AROM to improve gait mechanics.    Baseline see flowsheet; 2 deg on 06/05/21    Time 8    Period Weeks    Status On-going    Target Date 07/31/21      PT LONG TERM GOAL #4   Title Patient will tolerate at least 30 minutes of continous standing activity once cleared by MD for weight-bearing to improve her tolerance to work specific activity.    Baseline 20-25 min on 12/12    Time 8    Period Weeks     Status On-going    Target Date 07/31/21                   Plan - 06/21/21 1631     Clinical Impression Statement Pt was able to complete prescribed exercises with improved tolerance. She continues to have significant deficits in R ankle mobility that is limiting he rmobility and gait. She continues to benefit from skilled PT services and should be seen and progressed as tolerated.    PT Treatment/Interventions ADLs/Self Care Home Management;Aquatic Therapy;Cryotherapy;Electrical Stimulation;Moist Heat;Iontophoresis 4mg /ml Dexamethasone;Ultrasound;Gait training;Stair training;Functional mobility training;Therapeutic activities;Therapeutic exercise;Balance training;Neuromuscular re-education;Patient/family education;Manual techniques;Passive range of motion;Dry needling;Taping;Vasopneumatic Device    PT Next Visit Plan continue strengthening and gait training    PT Home Exercise Plan Access Code [email or web address]             Patient will benefit from skilled therapeutic intervention in order to improve the following deficits and impairments:  Decreased range of motion, Difficulty walking, Abnormal gait, Decreased activity tolerance, Pain, Impaired perceived functional ability, Decreased balance, Decreased strength, Decreased mobility  Visit Diagnosis: Pain in right ankle and joints of right foot  Stiffness of right ankle, not elsewhere classified  Difficulty in walking, not elsewhere classified  Muscle weakness (generalized)     Problem List Patient Active Problem List   Diagnosis Date Noted   Pre-diabetes 11/02/2020   Vitamin D deficiency 11/02/2020   At risk for diabetes mellitus 11/02/2020   Acute intractable headache 02/23/2020   Dizziness and giddiness 02/23/2020   Cyst of bone of left hand 12/10/2019   Visit for routine gyn exam 07/30/2019   Desire for pregnancy 07/30/2019   Dislocation of knee, anterior, left, closed 01/21/2016   Closed anterior dislocation of left  knee 01/21/2016   Depression 07/26/2014   Herpes labialis 07/03/2014   HIV disease (HCC) 04/23/2014   General counseling for prescription of oral contraceptives 06/11/2013   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

## 2021-06-27 ENCOUNTER — Ambulatory Visit: Payer: BC Managed Care – PPO

## 2021-06-29 ENCOUNTER — Other Ambulatory Visit: Payer: Self-pay

## 2021-06-29 ENCOUNTER — Ambulatory Visit: Payer: BC Managed Care – PPO | Attending: Orthopaedic Surgery

## 2021-06-29 DIAGNOSIS — R262 Difficulty in walking, not elsewhere classified: Secondary | ICD-10-CM | POA: Diagnosis not present

## 2021-06-29 DIAGNOSIS — M25671 Stiffness of right ankle, not elsewhere classified: Secondary | ICD-10-CM | POA: Diagnosis not present

## 2021-06-29 DIAGNOSIS — M25571 Pain in right ankle and joints of right foot: Secondary | ICD-10-CM | POA: Diagnosis not present

## 2021-06-29 DIAGNOSIS — M6281 Muscle weakness (generalized): Secondary | ICD-10-CM | POA: Diagnosis not present

## 2021-06-29 NOTE — Therapy (Signed)
[facility] Outpatient Rehabilitation [facility] [address] [place], [place], [address] Phone: [phone]   Fax:  [phone]  Physical Therapy Treatment  Patient Details  Name: MERLINDA WRUBEL MRN: [id number] Date of Birth: [birth year] Referring Provider (PT): Terance Hart, MD   Encounter Date: 06/29/2021   PT End of Session - 06/29/21 1116     Visit Number 15    Number of Visits 25    Date for PT Re-Evaluation 07/31/20    Authorization Type BCBS primary, MCD secondary    PT Start Time 1130    PT Stop Time 1211    PT Time Calculation (min) 41 min    Activity Tolerance Patient tolerated treatment well    Behavior During Therapy [facility] for tasks assessed/performed             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 She states she has done of couple of exercises from her HEP since last session. Pt is ready to begin PT treatment a this time.    Currently in Pain? Yes    Pain Score 5     Pain Location Foot    Pain Orientation Right           [facility] Adult PT Treatment/Exercise:   Therapeutic Exercise:  NuStep lvl 6 LE only x 4 min while taking subjective Slant board 2x45" Standing heel raise x 20 Sit to stand 2x10 -  no UE support R ankle inv/ev 3x10 RTB R ankle DF/PF GTB 2x10    Neuromuscular re-ed: Semi-tandem ball toss to rebounder 2 x 15 (yellow ball) Tadem stance 2x30" ea Marching on airex 2x20 Heel toe on airex x 20  Rocker board fwd/bwd x 15   Manual Therapy: Grade III AP and lateral glides to R ankle Distraction grade III to R ankle PROM into R ankle DF, Inv, Ev                               PT Short Term Goals - 06/15/21 1139       PT SHORT TERM GOAL #1   Title Patient will be independent with initial HEP.    Baseline issued at eval.    Status Achieved    Target Date 04/26/21      PT SHORT TERM GOAL #2   Title Patient will demonstrate at least 5 degrees of Rt ankle inversion and eversion AROM to improve ability to navigate on uneven terrain.    Baseline see flowsheet    Status Achieved    Target  Date 05/10/21      PT SHORT TERM GOAL #3   Title Patient will ambulate with step through pattern utilizing LRAD once cleared by MD for weight-bearing.    Status Achieved    Target Date 05/10/21               PT Long Term Goals - 06/05/21 1538       PT LONG TERM GOAL #1   Title Patient will demonstrate at least 4/5 strength about the Rt ankle to improve overall stability necessary for prolonged standing and walking.    Baseline see flowsheet    Time 8    Period Weeks    Status On-going    Target Date 07/31/21      PT LONG TERM GOAL #2   Title Patient will maintain SLS for at least 5 seconds on the RLE to signify improved ankle stability for functional activities.    Baseline unable    Time 8    Period Weeks    Status On-going    Target Date 07/31/21      PT LONG TERM GOAL #3   Title Patient will demonstrate at least 5 degrees of ankle DF AROM to improve gait mechanics.    Baseline see flowsheet; 2 deg on 06/05/21    Time 8    Period Weeks    Status On-going    Target Date 07/31/21      PT LONG TERM GOAL #4   Title Patient will  tolerate at least 30 minutes of continous standing activity once cleared by MD for weight-bearing to improve her tolerance to work specific activity.    Baseline 20-25 min on 12/12    Time 8    Period Weeks    Status On-going    Target Date 07/31/21                   Plan - 06/29/21 1146     Clinical Impression Statement Pt was able to complete prescribed exercises with no adverse effects noted. She continues to have significant deficits in R ankle mobility that is limiting he rmobility and gait. Pt responded well to manual therapy interventions, reporting decreased stiffness post session. She continues to benefit from skilled PT services and should be seen and progressed as tolerated.    PT Treatment/Interventions ADLs/Self Care Home Management;Aquatic Therapy;Cryotherapy;Electrical Stimulation;Moist Heat;Iontophoresis 4mg /ml Dexamethasone;Ultrasound;Gait training;Stair training;Functional mobility training;Therapeutic activities;Therapeutic exercise;Balance training;Neuromuscular re-education;Patient/family education;Manual techniques;Passive range of motion;Dry needling;Taping;Vasopneumatic Device    PT Next Visit Plan continue strengthening and gait training    PT Home Exercise Plan Access Code [email or web address]             Patient will benefit from skilled therapeutic intervention in order to improve the following deficits and impairments:  Decreased range of motion, Difficulty walking, Abnormal gait, Decreased activity tolerance, Pain, Impaired perceived functional ability, Decreased balance, Decreased strength, Decreased mobility  Visit Diagnosis: Pain in right ankle and joints of right foot  Stiffness of right ankle, not elsewhere classified  Difficulty in walking, not elsewhere classified  Muscle weakness (generalized)     Problem List Patient Active Problem List   Diagnosis Date Noted   Pre-diabetes 11/02/2020   Vitamin D deficiency 11/02/2020   At risk for diabetes  mellitus 11/02/2020   Acute intractable headache 02/23/2020   Dizziness and giddiness 02/23/2020   Cyst of bone of left hand 12/10/2019   Visit for routine gyn exam 07/30/2019   Desire for pregnancy 07/30/2019  Dislocation of knee, anterior, left, closed 01/21/2016   Closed anterior dislocation of left knee 01/21/2016   Depression 07/26/2014   Herpes labialis 07/03/2014   HIV disease (HCC) 04/23/2014   General counseling for prescription of oral contraceptives 06/11/2013   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

## 2021-07-03 ENCOUNTER — Ambulatory Visit: Payer: BC Managed Care – PPO

## 2021-07-03 ENCOUNTER — Other Ambulatory Visit: Payer: Self-pay

## 2021-07-03 DIAGNOSIS — M6281 Muscle weakness (generalized): Secondary | ICD-10-CM

## 2021-07-03 DIAGNOSIS — M25671 Stiffness of right ankle, not elsewhere classified: Secondary | ICD-10-CM | POA: Diagnosis not present

## 2021-07-03 DIAGNOSIS — R262 Difficulty in walking, not elsewhere classified: Secondary | ICD-10-CM | POA: Diagnosis not present

## 2021-07-03 DIAGNOSIS — M25571 Pain in right ankle and joints of right foot: Secondary | ICD-10-CM

## 2021-07-03 NOTE — Therapy (Signed)
[facility] Outpatient Rehabilitation [facility] [address] [place], [place], [address] Phone: [phone]   Fax:  [phone]  Physical Therapy Treatment  Patient Details  Name: Brenda Tyler MRN: [id number] Date of Birth: Aug 06, 1985 Referring Provider (PT): Terance Hart, MD   Encounter Date: 07/03/2021   PT End of Session - 07/03/21 0957     Visit Number 16    Number of Visits 25    Date for PT Re-Evaluation 07/31/20    Authorization Type BCBS primary, MCD secondary    PT Start Time 1000    PT Stop Time 1043   additional ice to R ankle x 10 min post session   PT Time Calculation (min) 43 min    Activity Tolerance Patient tolerated treatment well    Behavior During Therapy [facility] for tasks assessed/performed             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   Subjective Assessment - 07/03/21 0958     Subjective Pt presents to PT with no current reports of R ankle pain or discomfort. Has been fairly compliant with HEP per her report. She is ready to begin PT treatment at this time.    Currently in Pain? No/denies    Pain Score 0-No pain           [facility] Adult PT Treatment/Exercise:   Therapeutic Exercise:  NuStep lvl 6 LE only x 4 min while taking subjective Slant board 2x45" Standing heel-toe raise x 20 Step up 2x10 8in 1 UE support Sit to stand 2x10 - no UE support R ankle inv/ev 3x10 RTB R ankle  DF/PF GTB 2x10    Neuromuscular Re-Ed: Semi-tandem ball toss to rebounder 2 x 15 (yellow ball) Tadem stance 3x30" ea R back FT on foam 2x30" Marching on airex 2x20 Rocker board fwd/lat x 10 ea   Modalities: Ice to R ankle x post session                               PT Short Term Goals - 06/15/21 1139       PT SHORT TERM GOAL #1   Title Patient will be independent with initial HEP.    Baseline issued at eval.    Status Achieved    Target Date 04/26/21      PT SHORT TERM GOAL #2   Title Patient will demonstrate at least 5 degrees of Rt ankle inversion and eversion AROM to improve ability to navigate on uneven terrain.    Baseline see flowsheet    Status Achieved    Target Date 05/10/21      PT SHORT TERM GOAL #3   Title Patient will ambulate with step through pattern utilizing LRAD once cleared by MD  for weight-bearing.    Status Achieved    Target Date 05/10/21               PT Long Term Goals - 06/05/21 1538       PT LONG TERM GOAL #1   Title Patient will demonstrate at least 4/5 strength about the Rt ankle to improve overall stability necessary for prolonged standing and walking.    Baseline see flowsheet    Time 8    Period Weeks    Status On-going    Target Date 07/31/21      PT LONG TERM GOAL #2   Title Patient will maintain SLS for at least 5 seconds on the RLE to signify improved ankle stability for functional activities.    Baseline unable    Time 8    Period Weeks    Status On-going    Target Date 07/31/21      PT LONG TERM GOAL #3   Title Patient will demonstrate at least 5 degrees of ankle DF AROM to improve gait mechanics.    Baseline see flowsheet; 2 deg on 06/05/21    Time 8    Period Weeks    Status On-going    Target Date 07/31/21      PT LONG TERM GOAL #4   Title Patient will tolerate at least 30 minutes of continous standing activity once cleared by MD for weight-bearing to improve her tolerance to  work specific activity.    Baseline 20-25 min on 12/12    Time 8    Period Weeks    Status On-going    Target Date 07/31/21                   Plan - 07/03/21 1007     Clinical Impression Statement Pt was able to complete prescribed exercises today. She continues to have difficulty with R ankle stability in narrow BoS.Therapy today continued to focus on improving LE strength, particular emphasis on R ankle, and improving balance and stability. Pt continues to benefit from skilled PT services and will be progressed as tolerated.    PT Treatment/Interventions ADLs/Self Care Home Management;Aquatic Therapy;Cryotherapy;Electrical Stimulation;Moist Heat;Iontophoresis 4mg /ml Dexamethasone;Ultrasound;Gait training;Stair training;Functional mobility training;Therapeutic activities;Therapeutic exercise;Balance training;Neuromuscular re-education;Patient/family education;Manual techniques;Passive range of motion;Dry needling;Taping;Vasopneumatic Device    PT Next Visit Plan continue strengthening and gait training    PT Home Exercise Plan Access Code [email or web address]             Patient will benefit from skilled therapeutic intervention in order to improve the following deficits and impairments:  Decreased range of motion, Difficulty walking, Abnormal gait, Decreased activity tolerance, Pain, Impaired perceived functional ability, Decreased balance, Decreased strength, Decreased mobility  Visit Diagnosis: Pain in right ankle and joints of right foot  Stiffness of right ankle, not elsewhere classified  Difficulty in walking, not elsewhere classified  Muscle weakness (generalized)     Problem List Patient Active Problem List   Diagnosis Date Noted   Pre-diabetes 11/02/2020   Vitamin D deficiency 11/02/2020   At risk for diabetes mellitus 11/02/2020   Acute intractable headache 02/23/2020   Dizziness and giddiness 02/23/2020   Cyst of bone of left hand 12/10/2019   Visit for routine  gyn exam 07/30/2019   Desire for pregnancy 07/30/2019   Dislocation of knee, anterior, left, closed 01/21/2016   Closed anterior dislocation of left knee 01/21/2016   Depression 07/26/2014   Herpes labialis 07/03/2014   HIV disease (HCC) 04/23/2014  General counseling for prescription of oral contraceptives 06/11/2013   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 [place], [place], [address] Phone: [phone]   Fax:  [phone]  Name: CHERRY TURLINGTON MRN: [id number] Date of Birth: 1986/01/30

## 2021-07-05 ENCOUNTER — Other Ambulatory Visit: Payer: Self-pay

## 2021-07-05 ENCOUNTER — Ambulatory Visit: Payer: BC Managed Care – PPO

## 2021-07-05 DIAGNOSIS — M25571 Pain in right ankle and joints of right foot: Secondary | ICD-10-CM

## 2021-07-05 DIAGNOSIS — R262 Difficulty in walking, not elsewhere classified: Secondary | ICD-10-CM

## 2021-07-05 DIAGNOSIS — M6281 Muscle weakness (generalized): Secondary | ICD-10-CM | POA: Diagnosis not present

## 2021-07-05 DIAGNOSIS — M25671 Stiffness of right ankle, not elsewhere classified: Secondary | ICD-10-CM

## 2021-07-05 NOTE — Therapy (Signed)
[facility] [address] [place], [place], [address] Phone: [phone]   Fax:  [phone]  Physical Therapy Treatment  Patient Details  Name: Brenda Tyler MRN: [id number] Date of Birth: 02/13/1986 Referring Provider (PT): Terance Hart, MD   Encounter Date: 07/05/2021   PT End of Session - 07/05/21 1000     Visit Number 17    Number of Visits 25    Date for PT Re-Evaluation 07/31/20    Authorization Type BCBS primary, MCD secondary    PT Start Time 1000    PT Stop Time 1044   10 min ice post session   PT Time Calculation (min) 44 min    Activity Tolerance Patient tolerated treatment well    Behavior During Therapy [facility] for tasks assessed/performed             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 She had increased muscle soreness after last session. Pt is ready to begin PT treatment at this time.    Currently in Pain? Yes    Pain Score 9     Pain Location Ankle    Pain Orientation Right           [facility] Adult PT Treatment/Exercise:   Therapeutic Exercise:  NuStep lvl 6 LE only x 4 min while taking subjective Slant board 2x45" Standing heel-toe raise x 20 Step up x 10 8in 1 UE support Sit  to stand 2x10 - no UE support R ankle inv/ev 2x15 RTB R ankle DF/PF GTB 2x15   Neuromuscular Re-Ed: Semi-tandem ball toss to rebounder 2 x 15 (yellow ball) (NT) Tadem stance 3x30" ea R back FT on foam 2x45" Marching on airex 2x20 Rocker board fwd/lat x 10 ea (NT)  Manual Therapy: Grade III AP and lateral glides to R ankle Distraction grade III to R ankle   Modalities: Ice to R ankle x post session                               PT Short Term Goals - 06/15/21 1139       PT SHORT TERM GOAL #1   Title Patient will be independent with initial HEP.    Baseline issued at eval.    Status Achieved    Target Date 04/26/21      PT SHORT TERM GOAL #2   Title Patient will demonstrate at least 5 degrees of Rt ankle inversion and eversion AROM to improve ability to navigate on uneven terrain.    Baseline see flowsheet    Status  Achieved    Target Date 05/10/21      PT SHORT TERM GOAL #3   Title Patient will ambulate with step through pattern utilizing LRAD once cleared by MD for weight-bearing.    Status Achieved    Target Date 05/10/21               PT Long Term Goals - 06/05/21 1538       PT LONG TERM GOAL #1   Title Patient will demonstrate at least 4/5 strength about the Rt ankle to improve overall stability necessary for prolonged standing and walking.    Baseline see flowsheet    Time 8    Period Weeks    Status On-going    Target Date 07/31/21      PT LONG TERM GOAL #2   Title Patient will maintain SLS for at least 5 seconds on the RLE to signify improved ankle stability for functional activities.    Baseline unable    Time 8    Period Weeks    Status On-going    Target Date 07/31/21      PT LONG TERM GOAL #3   Title Patient will demonstrate at least 5 degrees of ankle DF AROM to improve gait mechanics.    Baseline see flowsheet; 2 deg on 06/05/21    Time 8    Period Weeks    Status On-going    Target Date 07/31/21       PT LONG TERM GOAL #4   Title Patient will tolerate at least 30 minutes of continous standing activity once cleared by MD for weight-bearing to improve her tolerance to work specific activity.    Baseline 20-25 min on 12/12    Time 8    Period Weeks    Status On-going    Target Date 07/31/21                   Plan - 07/05/21 1014     Clinical Impression Statement Pt was able to complete prescribed exercises today. She continues to have difficulty with R ankle stability in narrow BoS.Therapy today continued to focus on improving LE strength, particular emphasis on R ankle, and improving balance and stability. Pt continues to benefit from skilled PT services and will be progressed as tolerated.    PT Treatment/Interventions ADLs/Self Care Home Management;Aquatic Therapy;Cryotherapy;Electrical Stimulation;Moist Heat;Iontophoresis 4mg /ml Dexamethasone;Ultrasound;Gait training;Stair training;Functional mobility training;Therapeutic activities;Therapeutic exercise;Balance training;Neuromuscular re-education;Patient/family education;Manual techniques;Passive range of motion;Dry needling;Taping;Vasopneumatic Device    PT Next Visit Plan continue strengthening and gait training    PT Home Exercise Plan Access Code [email or web address]             Patient will benefit from skilled therapeutic intervention in order to improve the following deficits and impairments:  Decreased range of motion, Difficulty walking, Abnormal gait, Decreased activity tolerance, Pain, Impaired perceived functional ability, Decreased balance, Decreased strength, Decreased mobility  Visit Diagnosis: Pain in right ankle and joints of right foot  Stiffness of right ankle, not elsewhere classified  Difficulty in walking, not elsewhere classified  Muscle weakness (generalized)     Problem List Patient Active Problem List   Diagnosis Date Noted   Pre-diabetes 11/02/2020   Vitamin D deficiency 11/02/2020   At risk for  diabetes mellitus 11/02/2020   Acute intractable headache 02/23/2020   Dizziness and giddiness 02/23/2020   Cyst of bone of left hand 12/10/2019   Visit for routine gyn exam 07/30/2019   Desire for pregnancy 07/30/2019  Dislocation of knee, anterior, left, closed 01/21/2016   Closed anterior dislocation of left knee 01/21/2016   Depression 07/26/2014   Herpes labialis 07/03/2014   HIV disease (HCC) 04/23/2014   General counseling for prescription of oral contraceptives 06/11/2013   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

## 2021-07-10 ENCOUNTER — Other Ambulatory Visit: Payer: Self-pay

## 2021-07-10 ENCOUNTER — Ambulatory Visit: Payer: BC Managed Care – PPO

## 2021-07-10 DIAGNOSIS — M6281 Muscle weakness (generalized): Secondary | ICD-10-CM

## 2021-07-10 DIAGNOSIS — M25671 Stiffness of right ankle, not elsewhere classified: Secondary | ICD-10-CM

## 2021-07-10 DIAGNOSIS — M25571 Pain in right ankle and joints of right foot: Secondary | ICD-10-CM

## 2021-07-10 DIAGNOSIS — R262 Difficulty in walking, not elsewhere classified: Secondary | ICD-10-CM

## 2021-07-10 NOTE — Therapy (Signed)
[facility] [address] [place], [place], [address] Phone: [phone]   Fax:  [phone]  Physical Therapy Treatment  Patient Details  Name: Brenda Tyler MRN: [id number] Date of Birth: 11/02/1985 Referring Provider (PT): Terance HartAdair, Christopher R, MD   Encounter Date: 07/10/2021   PT End of Session - 07/10/21 0959     Visit Number 18    Number of Visits 25    Date for PT Re-Evaluation 07/31/20    Authorization Type BCBS primary, MCD secondary    PT Start Time 1000    PT Stop Time 1047   10 min vaso post session   PT Time Calculation (min) 47 min    Activity Tolerance Patient tolerated treatment well    Behavior [facility] for tasks assessed/performed             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   Subjective Assessment - 07/10/21 1000     Subjective Pt presents to PT with reports of continued R ankle pain and discomfort. Pt notes that she is still worried about returning to work, as she feels her R ankle pain greatly increases with standing and walking. She states continued compliance with HEP with no adverse effect. Pt is ready to begin PT at this time.    Currently in Pain? Yes    Pain Score 5            Therapeutic Exercise:  NuStep lvl 7 LE only x 4 min while taking subjective Slant board 2x45" Standing heel-toe raise x 20 Step up x 10 8in - no UE  support Sit to stand 2x15 - no UE support R ankle DF/PF/Inv/Ev GTB 2x15   Neuromuscular Re-Ed: Tadem stance 3x30" ea R back FT on foam 2x45" Marching on airex 2x20 Hurdle step over fwd (5) x 3 with R leading (step-to pattern) in // Hurdle step over lateral (5) x 3 with R leading (step-to pattern) in //   Manual Therapy: N/A   Modalities: GameReady - Right Ankle  40 deg Low comp 10 min                               PT Short Term Goals - 06/15/21 1139       PT SHORT TERM GOAL #1   Title Patient will be independent with initial HEP.    Baseline issued at eval.    Status Achieved    Target Date 04/26/21      PT SHORT TERM GOAL #2   Title Patient will demonstrate at least 5 degrees of Rt ankle inversion and eversion AROM to improve ability to navigate on uneven terrain.    Baseline see flowsheet    Status Achieved    Target  Date 05/10/21      PT SHORT TERM GOAL #3   Title Patient will ambulate with step through pattern utilizing LRAD once cleared by MD for weight-bearing.    Status Achieved    Target Date 05/10/21               PT Long Term Goals - 06/05/21 1538       PT LONG TERM GOAL #1   Title Patient will demonstrate at least 4/5 strength about the Rt ankle to improve overall stability necessary for prolonged standing and walking.    Baseline see flowsheet    Time 8    Period Weeks    Status On-going    Target Date 07/31/21      PT LONG TERM GOAL #2   Title Patient will maintain SLS for at least 5 seconds on the RLE to signify improved ankle stability for functional activities.    Baseline unable    Time 8    Period Weeks    Status On-going    Target Date 07/31/21      PT LONG TERM GOAL #3   Title Patient will demonstrate at least 5 degrees of ankle DF AROM to improve gait mechanics.    Baseline see flowsheet; 2 deg on 06/05/21    Time 8    Period Weeks    Status On-going    Target Date 07/31/21      PT LONG TERM  GOAL #4   Title Patient will tolerate at least 30 minutes of continous standing activity once cleared by MD for weight-bearing to improve her tolerance to work specific activity.    Baseline 20-25 min on 12/12    Time 8    Period Weeks    Status On-going    Target Date 07/31/21                   Plan - 07/10/21 1015     Clinical Impression Statement Marna was able to complete all prescribed exercises today with on adverse effect. Therapy today continued to focus on improving R ankle stability and LE strength/balance. She tolerated treatment better today and showed improved balance and stability on R LE. She continues to benefit from skilled PT services and will continue to be seen and progressed as tolerated.    PT Treatment/Interventions ADLs/Self Care Home Management;Aquatic Therapy;Cryotherapy;Electrical Stimulation;Moist Heat;Iontophoresis 4mg /ml Dexamethasone;Ultrasound;Gait training;Stair training;Functional mobility training;Therapeutic activities;Therapeutic exercise;Balance training;Neuromuscular re-education;Patient/family education;Manual techniques;Passive range of motion;Dry needling;Taping;Vasopneumatic Device    PT Next Visit Plan continue strengthening and gait training    PT Home Exercise Plan Access Code [email or web address]             Patient will benefit from skilled therapeutic intervention in order to improve the following deficits and impairments:  Decreased range of motion, Difficulty walking, Abnormal gait, Decreased activity tolerance, Pain, Impaired perceived functional ability, Decreased balance, Decreased strength, Decreased mobility  Visit Diagnosis: Pain in right ankle and joints of right foot  Stiffness of right ankle, not elsewhere classified  Difficulty in walking, not elsewhere classified  Muscle weakness (generalized)     Problem List Patient Active Problem List   Diagnosis Date Noted   Pre-diabetes 11/02/2020   Vitamin D deficiency  11/02/2020   At risk for diabetes mellitus 11/02/2020   Acute intractable headache 02/23/2020   Dizziness and giddiness 02/23/2020   Cyst of bone of left hand 12/10/2019   Visit for routine gyn exam 07/30/2019   Desire for pregnancy 07/30/2019  Dislocation of knee, anterior, left, closed 01/21/2016   Closed anterior dislocation of left knee 01/21/2016   Depression 07/26/2014   Herpes labialis 07/03/2014   HIV disease (HCC) 04/23/2014   General counseling for prescription of oral contraceptives 06/11/2013   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 [place], [place], [address] Phone: [phone]   Fax:  [phone]  Name: RHEMA BOYETT MRN: [id number] Date of Birth: August 13, 1985

## 2021-07-13 ENCOUNTER — Ambulatory Visit: Payer: BC Managed Care – PPO | Admitting: Infectious Diseases

## 2021-07-17 ENCOUNTER — Ambulatory Visit: Payer: BC Managed Care – PPO

## 2021-07-19 ENCOUNTER — Other Ambulatory Visit: Payer: Self-pay

## 2021-07-19 ENCOUNTER — Ambulatory Visit: Payer: BC Managed Care – PPO

## 2021-07-19 DIAGNOSIS — M25571 Pain in right ankle and joints of right foot: Secondary | ICD-10-CM | POA: Diagnosis not present

## 2021-07-19 DIAGNOSIS — M6281 Muscle weakness (generalized): Secondary | ICD-10-CM | POA: Diagnosis not present

## 2021-07-19 DIAGNOSIS — M25671 Stiffness of right ankle, not elsewhere classified: Secondary | ICD-10-CM

## 2021-07-19 DIAGNOSIS — R262 Difficulty in walking, not elsewhere classified: Secondary | ICD-10-CM | POA: Diagnosis not present

## 2021-07-19 NOTE — Therapy (Addendum)
[facility] [address] [place], [place], [address] Phone: [phone]   Fax:  [phone]  Physical Therapy Treatment  Patient Details  Name: Brenda Tyler MRN: [id number] Date of Birth: Dec 10, 1985 Referring Provider (Brenda Tyler): Terance Hart, MD   Encounter Date: 07/19/2021   Brenda Tyler End of Session - 07/19/21 1048     Visit Number 19    Number of Visits 25    Date for Brenda Tyler Re-Evaluation 07/31/20    Authorization Type BCBS primary, MCD secondary    Brenda Tyler Start Time 1048    Brenda Tyler Stop Time 1128    Brenda Tyler Time Calculation (min) 40 min    Activity Tolerance Patient tolerated treatment well    Behavior During Therapy [facility] for tasks assessed/performed             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   Subjective Assessment - 07/19/21 1049     Subjective Brenda Tyler presents to Brenda Tyler with no current ankle pain. Has continued compliance with HEP with no adverse effect. Brenda Tyler is ready to begin Brenda Tyler at this time.    Currently in Pain? No/denies    Pain Score 0-No pain            Therapeutic Exercise:  NuStep lvl 7 LE only x 4 min while taking subjective Slant board 2x45" Standing heel-toe raise 2x15 Step up x 10 8in - no UE support Sit to stand 2x15 - no UE support R ankle DF/PF/Inv/Ev GTB 2x15 (NT)   Neuromuscular Re-Ed: Tadem stance 2x30" ea R back FT on foam 2x45" Marching on airex 2x20 (NT) Hurdle  step over fwd (6) x 4 with ea leading (step-to pattern) in // Hurdle step over lateral (6) x 3 with R leading (step-to pattern) in //   Manual Therapy: N/A   Modalities: GameReady - Right Ankle  40 deg Low compression 15 min                               Brenda Tyler Short Term Goals - 06/15/21 1139       Brenda Tyler SHORT TERM GOAL #1   Title Patient will be independent with initial HEP.    Baseline issued at eval.    Status Achieved    Target Date 04/26/21      Brenda Tyler SHORT TERM GOAL #2   Title Patient will demonstrate at least 5 degrees of Rt ankle inversion and eversion AROM to improve ability to navigate on uneven terrain.    Baseline see flowsheet    Status Achieved    Target Date 05/10/21      Brenda Tyler SHORT TERM GOAL #3   Title Patient will ambulate with step through pattern utilizing LRAD once cleared by MD for weight-bearing.  Status Achieved    Target Date 05/10/21               Brenda Tyler Long Term Goals - 06/05/21 1538       Brenda Tyler LONG TERM GOAL #1   Title Patient will demonstrate at least 4/5 strength about the Rt ankle to improve overall stability necessary for prolonged standing and walking.    Baseline see flowsheet    Time 8    Period Weeks    Status On-going    Target Date 07/31/21      Brenda Tyler LONG TERM GOAL #2   Title Patient will maintain SLS for at least 5 seconds on the RLE to signify improved ankle stability for functional activities.    Baseline unable    Time 8    Period Weeks    Status On-going    Target Date 07/31/21      Brenda Tyler LONG TERM GOAL #3   Title Patient will demonstrate at least 5 degrees of ankle DF AROM to improve gait mechanics.    Baseline see flowsheet; 2 deg on 06/05/21    Time 8    Period Weeks    Status On-going    Target Date 07/31/21      Brenda Tyler LONG TERM GOAL #4   Title Patient will tolerate at least 30 minutes of continous standing activity once cleared by MD for weight-bearing to improve her tolerance to work specific  activity.    Baseline 20-25 min on 12/12    Time 8    Period Weeks    Status On-going    Target Date 07/31/21                   Plan - 07/19/21 1054     Clinical Impression Statement Brenda Tyler was again able to complete all prescribed exercises with no adverse effect or increase in pain noted. Therapy today continued to focus on improving R ankle stability and LE strength/balance. She tolerated treatment better today and showed improved balance and stability on R LE. She continues to benefit from skilled Brenda Tyler services and will continue to be seen and progressed as tolerated.    Brenda Tyler Treatment/Interventions ADLs/Self Care Home Management;Aquatic Therapy;Cryotherapy;Electrical Stimulation;Moist Heat;Iontophoresis 4mg /ml Dexamethasone;Ultrasound;Gait training;Stair training;Functional mobility training;Therapeutic activities;Therapeutic exercise;Balance training;Neuromuscular re-education;Patient/family education;Manual techniques;Passive range of motion;Dry needling;Taping;Vasopneumatic Device    Brenda Tyler Next Visit Plan continue strengthening and gait training    Brenda Tyler Home Exercise Plan Access Code [email or web address]             Patient will benefit from skilled therapeutic intervention in order to improve the following deficits and impairments:  Decreased range of motion, Difficulty walking, Abnormal gait, Decreased activity tolerance, Pain, Impaired perceived functional ability, Decreased balance, Decreased strength, Decreased mobility  Visit Diagnosis: Pain in right ankle and joints of right foot  Stiffness of right ankle, not elsewhere classified     Problem List Patient Active Problem List   Diagnosis Date Noted   Pre-diabetes 11/02/2020   Vitamin D deficiency 11/02/2020   At risk for diabetes mellitus 11/02/2020   Acute intractable headache 02/23/2020   Dizziness and giddiness 02/23/2020   Cyst of bone of left hand 12/10/2019   Visit for routine gyn exam 07/30/2019   Desire for pregnancy  07/30/2019   Dislocation of knee, anterior, left, closed 01/21/2016   Closed anterior dislocation of left knee 01/21/2016   Depression 07/26/2014   Herpes labialis 07/03/2014   HIV disease (HCC) 04/23/2014   General counseling for prescription of  oral contraceptives 06/11/2013   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

## 2021-07-24 ENCOUNTER — Telehealth: Payer: Self-pay

## 2021-07-24 ENCOUNTER — Ambulatory Visit: Payer: BC Managed Care – PPO

## 2021-07-24 NOTE — Telephone Encounter (Signed)
LVM notifying patient of missed PT appointment. Reviewed attendance policy and reminded patient of next scheduled visit.   Letitia Libra, PT, DPT, ATC 07/24/21 11:31 AM

## 2021-07-26 ENCOUNTER — Ambulatory Visit: Payer: BC Managed Care – PPO | Attending: Orthopaedic Surgery

## 2021-07-26 ENCOUNTER — Other Ambulatory Visit: Payer: Self-pay

## 2021-07-26 DIAGNOSIS — M25571 Pain in right ankle and joints of right foot: Secondary | ICD-10-CM | POA: Diagnosis not present

## 2021-07-26 DIAGNOSIS — M6281 Muscle weakness (generalized): Secondary | ICD-10-CM | POA: Insufficient documentation

## 2021-07-26 DIAGNOSIS — M25671 Stiffness of right ankle, not elsewhere classified: Secondary | ICD-10-CM | POA: Diagnosis not present

## 2021-07-26 DIAGNOSIS — R262 Difficulty in walking, not elsewhere classified: Secondary | ICD-10-CM | POA: Diagnosis not present

## 2021-07-26 NOTE — Therapy (Addendum)
[place], [place], [address] Phone: [phone]   Fax:  [phone]  Physical Therapy Treatment PHYSICAL THERAPY DISCHARGE SUMMARY  Visits from Start of Care: 20  Current functional level related to goals / functional outcomes: See goals below   Remaining deficits: Status unknown   Education / Equipment: N/a   Patient agrees to discharge. Patient goals were partially met. Patient is being discharged due to not returning since the last visit.  Patient Details  Name: Brenda Tyler MRN: [id number] Date of Birth: 19-Apr-1986 Referring Provider (PT): Erle Crocker, MD   Encounter Date: 07/26/2021   PT End of Session - 07/26/21 1058     Visit Number 20    Number of Visits 25    Date for PT Re-Evaluation 07/31/20    Authorization Type BCBS primary, MCD secondary    PT Start Time 21    PT Stop Time 1154   game ready 10 minutes   PT Time Calculation (min) 54 min    Activity Tolerance Patient tolerated treatment well    Behavior During Therapy [facility] for tasks assessed/performed             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 on pap 4/15 - s/p Colpo 11/02/13 - CIN II -->    HIV (human immunodeficiency virus infection) ([place])    HSV (herpes simplex virus) infection    Obesity    Oropharyngeal candidiasis 07/05/2014   Other fatigue    Shortness of breath on exertion     Past Surgical History:  Procedure Laterality Date   INCISION AND DRAINAGE ABSCESS     on abdomen and buttocks    There were no vitals filed for this visit.   Subjective Assessment - 07/26/21 1100     Subjective Patient reports she went back to work this week, but it has caused more pain. She was wearing an ankle brace, but this did not help. Her ankle was stiff and swollen after  work yesterday and tried icing, but this did not help.    Currently in Pain? Yes    Pain Score 10-Worst pain ever    Pain Location Ankle    Pain Orientation Right    Pain Descriptors / Indicators Throbbing    Pain Type Chronic pain    Pain Onset More than a month ago    Pain Frequency Intermittent    Aggravating Factors  weightbearing, walking    Pain Relieving Factors rest               [facility] Adult PT Treatment:                                                DATE: 07/26/21 Therapeutic Exercise: NuStep level 6 LE/UE x 6 minutes  Calf stretch on wedge 2 x 30 sec Calf raises on airex 2 x 10   Step ups 8 inch 1 x 10 each   Neuromuscular re-ed: Semi-tandem on airex 1 x 30 sec each  Tandem on airex 2 x 30 sec each  SLS multiple trials each; unable RLE, 1-2 seconds LLE  [place] pickups 1 x 20 RLE  Modalities:  Gameready x 10 minutes low pressure Rt ankle                                PT Short Term Goals - 06/15/21 1139       PT SHORT TERM GOAL #1   Title Patient will be independent with initial HEP.    Baseline issued at eval.    Status Achieved    Target Date 04/26/21      PT SHORT TERM GOAL #2   Title Patient will demonstrate at least 5 degrees of Rt ankle inversion and eversion AROM to improve ability to navigate on uneven terrain.    Baseline see flowsheet    Status Achieved    Target Date 05/10/21      PT SHORT TERM GOAL #3   Title Patient will ambulate with step through pattern utilizing LRAD once cleared by MD for weight-bearing.    Status Achieved    Target Date 05/10/21               PT Long Term Goals - 06/05/21 1538       PT LONG TERM GOAL #1   Title Patient will demonstrate at least 4/5 strength about the Rt ankle to improve overall stability necessary for prolonged standing and walking.    Baseline see flowsheet    Time 8    Period Weeks    Status On-going    Target Date 07/31/21      PT LONG TERM GOAL #2    Title Patient will maintain SLS for at least 5 seconds on the RLE to signify improved ankle stability for functional activities.    Baseline unable    Time 8    Period Weeks    Status On-going    Target Date 07/31/21      PT LONG TERM GOAL #3   Title Patient will demonstrate at least 5 degrees of ankle DF AROM to improve gait mechanics.    Baseline see flowsheet; 2 deg on 06/05/21    Time 8    Period Weeks    Status On-going    Target Date 07/31/21      PT LONG TERM GOAL #4   Title Patient will tolerate at least 30 minutes of continous standing activity once cleared by MD for weight-bearing to improve her tolerance to work specific activity.    Baseline 20-25 min on 12/12    Time 8    Period Weeks    Status On-going    Target Date 07/31/21                   Plan - 07/26/21 1100     Clinical Impression Statement Patient arrives with high pain levels about the Rt ankle attributing it to starting back at work yesterday. Continued to work standing strengthening and balance, which she tolerated fairly well, continuing to report high pain levels throughout session. She demonstrates good postural control with tandem stance, though is unable to maintain balance during SLS on the RLE and is only able to maintain for 1-2 seconds on the LLE. Encouraged patient to continue to utilize ankle brace with standing/walking activity at work, utilize modalities for pain control following work activity, and discuss potential need for work restrictions with MD if she is unable to perform work specific tasks at this time.    PT Treatment/Interventions ADLs/Self Care Home Management;Aquatic Therapy;Cryotherapy;Electrical Stimulation;Moist Heat;Iontophoresis 78m/ml  Dexamethasone;Ultrasound;Gait training;Stair training;Functional mobility training;Therapeutic activities;Therapeutic exercise;Balance training;Neuromuscular re-education;Patient/family education;Manual techniques;Passive range of motion;Dry  needling;Taping;Vasopneumatic Device    PT Next Visit Plan continue strengthening and gait training    PT Home Exercise Plan Access Code [email or web address]             Patient will benefit from skilled therapeutic intervention in order to improve the following deficits and impairments:  Decreased range of motion, Difficulty walking, Abnormal gait, Decreased activity tolerance, Pain, Impaired perceived functional ability, Decreased balance, Decreased strength, Decreased mobility  Visit Diagnosis: Pain in right ankle and joints of right foot  Stiffness of right ankle, not elsewhere classified  Difficulty in walking, not elsewhere classified  Muscle weakness (generalized)     Problem List Patient Active Problem List   Diagnosis Date Noted   Pre-diabetes 11/02/2020   Vitamin D deficiency 11/02/2020   At risk for diabetes mellitus 11/02/2020   Acute intractable headache 02/23/2020   Dizziness and giddiness 02/23/2020   Cyst of bone of left hand 12/10/2019   Visit for routine gyn exam 07/30/2019   Desire for pregnancy 07/30/2019   Dislocation of knee, anterior, left, closed 01/21/2016   Closed anterior dislocation of left knee 01/21/2016   Depression 07/26/2014   Herpes labialis 07/03/2014   HIV disease ([place]) 04/23/2014   General counseling for prescription of oral contraceptives 06/11/2013   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

## 2021-07-31 ENCOUNTER — Ambulatory Visit: Payer: BC Managed Care – PPO

## 2021-07-31 ENCOUNTER — Telehealth: Payer: Self-pay

## 2021-07-31 NOTE — Telephone Encounter (Signed)
LVM regarding missed PT appointment. Informed patient that this was her last scheduled visit and to call if she would like to reschedule.   Letitia Libra, PT, DPT, ATC 07/31/21 4:16 PM

## 2021-08-03 ENCOUNTER — Other Ambulatory Visit: Payer: Self-pay

## 2021-08-03 ENCOUNTER — Ambulatory Visit (INDEPENDENT_AMBULATORY_CARE_PROVIDER_SITE_OTHER): Payer: BC Managed Care – PPO | Admitting: Obstetrics and Gynecology

## 2021-08-03 ENCOUNTER — Encounter: Payer: Self-pay | Admitting: Obstetrics and Gynecology

## 2021-08-03 VITALS — BP 127/85 | HR 87 | Ht 64.5 in | Wt 312.1 lb

## 2021-08-03 DIAGNOSIS — N911 Secondary amenorrhea: Secondary | ICD-10-CM

## 2021-08-03 NOTE — Patient Instructions (Signed)

## 2021-08-03 NOTE — Progress Notes (Signed)
Ms Brenda Tyler presents for follow up of amenorrhea. See prior office notes. Pt has not taken Prometrium. No cycle since March Still desires pregnancy  PE AF VSS Lungs clear Heart RRR Abd soft + BS  A/P Amenorrhea, suspect primary due to elevated FSH in the past  Will repeat FSH and anti mullerian hormone today. Information for Dr April Manson provided.  F/U PRN

## 2021-08-10 LAB — ANTI MULLERIAN HORMONE: ANTI-MULLERIAN HORMONE (AMH): <0.015 ng/mL

## 2021-08-10 LAB — FOLLICLE STIMULATING HORMONE: FSH: 52.3 m[IU]/mL

## 2021-08-11 ENCOUNTER — Other Ambulatory Visit: Payer: Self-pay | Admitting: Obstetrics and Gynecology

## 2021-08-11 DIAGNOSIS — E2839 Other primary ovarian failure: Secondary | ICD-10-CM

## 2021-08-14 ENCOUNTER — Telehealth: Payer: Self-pay

## 2021-08-14 ENCOUNTER — Encounter: Payer: BC Managed Care – PPO | Admitting: Obstetrics and Gynecology

## 2021-08-14 NOTE — Telephone Encounter (Signed)
Called pt and informed pt that I have faxed over referral information to [facility].  Pt states that she has seen them in the past and mentioned something about a donor egg.  I advised pt that she should continue with their recommendation as she is desiring pregnancy.  Pt states that she has [facility] number and will call them if she has not heard from them in two weeks.    Brenda Tyler  08/14/21

## 2021-08-14 NOTE — Telephone Encounter (Signed)
-----   Message from Chancy Milroy, MD sent at 08/11/2021 11:15 AM EST ----- Please refer to [facility] for ovarian failure and desire for pregnancy. Pt is aware I have placed the referral  Thanks Legrand Como

## 2021-09-13 ENCOUNTER — Encounter (HOSPITAL_COMMUNITY): Payer: Self-pay | Admitting: Emergency Medicine

## 2021-09-13 ENCOUNTER — Ambulatory Visit (HOSPITAL_COMMUNITY)
Admission: EM | Admit: 2021-09-13 | Discharge: 2021-09-13 | Disposition: A | Discharge status: Home / Self Care | Payer: BC Managed Care – PPO | Attending: Nurse Practitioner | Admitting: Nurse Practitioner

## 2021-09-13 ENCOUNTER — Other Ambulatory Visit: Payer: Self-pay

## 2021-09-13 DIAGNOSIS — R059 Cough, unspecified: Secondary | ICD-10-CM | POA: Diagnosis not present

## 2021-09-13 DIAGNOSIS — R6889 Other general symptoms and signs: Secondary | ICD-10-CM

## 2021-09-13 DIAGNOSIS — Z20822 Contact with and (suspected) exposure to covid-19: Secondary | ICD-10-CM | POA: Diagnosis not present

## 2021-09-13 DIAGNOSIS — M791 Myalgia, unspecified site: Secondary | ICD-10-CM | POA: Diagnosis not present

## 2021-09-13 DIAGNOSIS — R0981 Nasal congestion: Secondary | ICD-10-CM | POA: Insufficient documentation

## 2021-09-13 DIAGNOSIS — R519 Headache, unspecified: Secondary | ICD-10-CM | POA: Diagnosis not present

## 2021-09-13 DIAGNOSIS — Z28310 Unvaccinated for covid-19: Secondary | ICD-10-CM | POA: Insufficient documentation

## 2021-09-13 DIAGNOSIS — B20 Human immunodeficiency virus [HIV] disease: Secondary | ICD-10-CM | POA: Diagnosis not present

## 2021-09-13 DIAGNOSIS — R042 Hemoptysis: Secondary | ICD-10-CM | POA: Insufficient documentation

## 2021-09-13 LAB — POC INFLUENZA A AND B ANTIGEN (URGENT CARE ONLY)
INFLUENZA A ANTIGEN, POC: NEGATIVE
INFLUENZA B ANTIGEN, POC: NEGATIVE

## 2021-09-13 LAB — SARS CORONAVIRUS 2 (TAT 6-24 HRS): SARS Coronavirus 2: NEGATIVE

## 2021-09-13 MED ORDER — PSEUDOEPH-BROMPHEN-DM 30-2-10 MG/5ML PO SYRP: 5.0000 mL | ORAL_SOLUTION | Freq: Four times a day (QID) | ORAL | 0 refills | Status: DC | PRN

## 2021-09-13 MED ORDER — ONDANSETRON 4 MG PO TBDP: 4.0000 mg | ORAL_TABLET | Freq: Three times a day (TID) | ORAL | 0 refills | Status: DC | PRN

## 2021-09-13 NOTE — ED Provider Notes (Signed)
?[facility] ? ? ? ?CSN: [id number] ?Arrival date & time: 09/13/21  0800 ? ? ?  ? ?History   ?Chief Complaint ?Chief Complaint  ?Patient presents with  ? Headache  ? Generalized Body Aches  ? Emesis  ? ? ?HPI ?Brenda Tyler is a 36 y.o. female.  ? ?The patient is a 36 year old female who presents for influenza-like symptoms.  Patient states symptoms started 2 days ago.  She complains of fever, chills, myalgias, headaches, nasal congestion, cough, and nausea.  She denies vomiting, diarrhea, shortness of breath or wheezing.  Patient states that she started taking Tylenol 2 days ago, and started Mucinex 1 day ago.  States that she has been coughing up green, blood-tinged sputum.  Patient states that she took a COVID test at [facility] when her symptoms started, it was negative.  Patient denies any sick contacts, but states that her son was sick with a cold but she was not around him.  She has not received the COVID vaccines, she states that she did get an influenza test.   ? ? ?Headache ?Associated symptoms: congestion, cough, drainage, fatigue, fever and nausea   ?Associated symptoms: no diarrhea, no ear pain, no sinus pressure, no sore throat and no vomiting   ?Emesis ?Associated symptoms: cough, fever and headaches   ?Associated symptoms: no diarrhea and no sore throat   ? ?Past Medical History:  ?Diagnosis Date  ? Anxiety   ? GERD (gastroesophageal reflux disease)   ? HGSIL on Pap smear of cervix 12/22/2012  ? HSIL s/p Colposcopy at 12/14 - CINI LSIL on pap 4/15 - s/p Colpo 11/02/13 - CIN II -->   ? HIV (human immunodeficiency virus infection) ([place])   ? HSV (herpes simplex virus) infection   ? Obesity   ? Oropharyngeal candidiasis 07/05/2014  ? Other fatigue   ? Shortness of breath on exertion   ? ? ?Patient Active Problem List  ? Diagnosis Date Noted  ? Pre-diabetes 11/02/2020  ? Vitamin D deficiency 11/02/2020  ? At risk for diabetes mellitus 11/02/2020  ? Acute intractable headache 02/23/2020  ? Dizziness  and giddiness 02/23/2020  ? Cyst of bone of left hand 12/10/2019  ? Visit for routine gyn exam 07/30/2019  ? Desire for pregnancy 07/30/2019  ? Dislocation of knee, anterior, left, closed 01/21/2016  ? Closed anterior dislocation of left knee 01/21/2016  ? Depression 07/26/2014  ? Herpes labialis 07/03/2014  ? HIV disease ([place]) 04/23/2014  ? General counseling for prescription of oral contraceptives 06/11/2013  ? 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 Obesity 12/22/2012  ? ? ?Past Surgical History:  ?Procedure Laterality Date  ? INCISION AND DRAINAGE ABSCESS    ? on abdomen and buttocks  ? ? ?OB History   ? ? Gravida  ?4  ? Para  ?3  ? Term  ?3  ? Preterm  ?   ? AB  ?1  ? Living  ?3  ?  ? ? SAB  ?   ? IAB  ?1  ? Ectopic  ?   ? Multiple  ?   ? Live Births  ?   ?   ?  ?  ? ? ? ?Home Medications   ? ?Prior to Admission medications   ?Medication Sig Start Date End Date Taking? Authorizing Provider  ?SYMTUZA [id number] MG TABS TAKE 1 TABLET BY MOUTH DAILY WITH BREAKFAST. 12/19/20   Campbell Riches, MD  ? ? ?Family History ?Family History  ?Problem Relation Age of  Onset  ? Heart disease Mother   ? Sudden death Mother   ? Stroke Mother   ? Hypertension Maternal Grandmother   ? Diabetes Maternal Grandmother   ? Heart disease Maternal Grandmother   ? Cancer Maternal Grandmother   ? Hypertension Paternal Grandmother   ? Diabetes Paternal Grandmother   ? Heart disease Paternal Grandmother   ? ? ?Social History ?Social History  ? ?Tobacco Use  ? Smoking status: Never  ? Smokeless tobacco: Never  ?Vaping Use  ? Vaping Use: Never used  ?Substance Use Topics  ? Alcohol use: Yes  ?  Alcohol/week: 2.0 standard drinks  ?  Types: 2 Standard drinks or equivalent per week  ?  Comment: occasional  ? Drug use: Not Currently  ? ? ? ?Allergies   ?Dilaudid [hydromorphone hcl] ? ? ?Review of Systems ?Review of Systems  ?Constitutional:  Positive for activity change, appetite change, fatigue and fever.  ?HENT:  Positive for congestion,  postnasal drip and rhinorrhea. Negative for ear pain, sinus pressure, sinus pain and sore throat.   ?Eyes: Negative.   ?Respiratory:  Positive for cough. Negative for shortness of breath and wheezing.   ?Cardiovascular: Negative.   ?Gastrointestinal:  Positive for nausea. Negative for diarrhea and vomiting.  ?Skin: Negative.   ?Neurological:  Positive for headaches.  ?Psychiatric/Behavioral: Negative.    ? ? ?Physical Exam ?Triage Vital Signs ?ED Triage Vitals  ?Enc Vitals Group  ?   BP 09/13/21 0820 127/85  ?   Pulse Rate 09/13/21 0820 70  ?   Resp 09/13/21 0820 18  ?   Temp 09/13/21 0820 97.9 ?F (36.6 ?C)  ?   Temp Source 09/13/21 0820 Oral  ?   SpO2 09/13/21 0820 96 %  ?   Weight 09/13/21 0819 (!) 312 lb 2.7 oz (141.6 kg)  ?   Height 09/13/21 0819 5' 4.5" (1.638 m)  ?   Head Circumference --   ?   Peak Flow --   ?   Pain Score 09/13/21 0818 5  ?   Pain Loc --   ?   Pain Edu? --   ?   Excl. in [place]? --   ? ?No data found. ? ?Updated Vital Signs ?BP 127/85 (BP Location: Left Arm)   Pulse 70   Temp 97.9 ?F (36.6 ?C) (Oral)   Resp 18   Ht 5' 4.5" (1.638 m)   Wt (!) 312 lb 2.7 oz (141.6 kg)   SpO2 96%   BMI 52.76 kg/m?  ? ?Visual Acuity ?Right Eye Distance:   ?Left Eye Distance:   ?Bilateral Distance:   ? ?Right Eye Near:   ?Left Eye Near:    ?Bilateral Near:    ? ?Physical Exam ?Vitals reviewed.  ?Constitutional:   ?   General: She is not in acute distress. ?   Appearance: She is well-developed.  ?HENT:  ?   Head: Normocephalic and atraumatic.  ?   Mouth/Throat:  ?   Mouth: Mucous membranes are moist.  ?Eyes:  ?   Extraocular Movements: Extraocular movements intact.  ?   Pupils: Pupils are equal, round, and reactive to light.  ?Cardiovascular:  ?   Rate and Rhythm: Normal rate and regular rhythm.  ?   Heart sounds: Normal heart sounds.  ?Pulmonary:  ?   Effort: Pulmonary effort is normal.  ?   Breath sounds: Normal breath sounds.  ?Abdominal:  ?   General: Bowel sounds are normal.  ?   Tenderness:  There is no  abdominal tenderness.  ?Musculoskeletal:  ?   Cervical back: Normal range of motion and neck supple.  ?Skin: ?   General: Skin is warm and dry.  ?Neurological:  ?   Mental Status: She is alert and oriented to person, place, and time.  ?Psychiatric:     ?   Mood and Affect: Mood normal.     ?   Behavior: Behavior normal.  ? ? ? ?UC Treatments / Results  ?Labs ?(all labs ordered are listed, but only abnormal results are displayed) ?Labs Reviewed  ?SARS CORONAVIRUS 2 (TAT 6-24 HRS)  ?POC INFLUENZA A AND B ANTIGEN (URGENT CARE ONLY)  ? ? ?EKG ? ? ?Radiology ?No results found. ? ?Procedures ?Procedures (including critical care time) ? ?Medications Ordered in UC ?Medications - No data to display ? ?Initial Impression / Assessment and Plan / UC Course  ?I have reviewed the triage vital signs and the nursing notes. ? ?Pertinent labs & imaging results that were available during my care of the patient were reviewed by me and considered in my medical decision making (see chart for details). ? ?The patient is a 36 year old female who presents with influenza-like symptoms for the past 2 days.  She complains of myalgias, headache, fevers, chills, cough, and nausea.  She is afebrile at present.  Her vital signs are stable, and and she is in no acute distress.  A COVID test was also performed today, patient will be notified if her results are positive.  Symptoms are consistent with influenza; although, her flu test is negative.  Will provide symptomatic treatment for the patient to include something for her cough, and something for nausea.  Patient encouraged to increase fluids, get plenty of rest.  Recommend a brat diet until her nausea improves.  Patient also advised to remain at home if she is symptomatic or to have a mask on when she is around others.  Patient encouraged to follow-up if symptoms worsen or do not improve. ? ?Final Clinical Impressions(s) / UC Diagnoses  ? ?Final diagnoses:  ?None  ? ?Discharge Instructions    ?None ?  ? ?ED Prescriptions   ?None ?  ? ?PDMP not reviewed this encounter. ?  ?Tish Men, NP ?09/13/21 0914 ? ?

## 2021-09-13 NOTE — ED Triage Notes (Signed)
Pt reports headache, generalized body aches, chills and emesis since Monday. States took OTC tylenol and Mucinex with no relief.  ?

## 2021-09-13 NOTE — Discharge Instructions (Addendum)
Your flu test is negative today.  We are awaiting your COVID results, if they come back positive, you will be contacted.  If you have access to MyChart, you can see the results there within the next 24 to 48 hours. ?Take medication as directed. ?Increase rest and get plenty of fluids while your symptoms persist. ?May take over-the counter ibuprofen or Tylenol for pain, fever, or general discomfort. ?Follow-up if your symptoms do not improve. ? ?

## 2021-09-18 ENCOUNTER — Encounter: Payer: Self-pay | Admitting: Podiatry

## 2021-09-18 ENCOUNTER — Ambulatory Visit (INDEPENDENT_AMBULATORY_CARE_PROVIDER_SITE_OTHER): Payer: BC Managed Care – PPO

## 2021-09-18 ENCOUNTER — Ambulatory Visit (INDEPENDENT_AMBULATORY_CARE_PROVIDER_SITE_OTHER): Payer: BC Managed Care – PPO | Admitting: Podiatry

## 2021-09-18 ENCOUNTER — Telehealth: Payer: BC Managed Care – PPO | Admitting: Physician Assistant

## 2021-09-18 ENCOUNTER — Other Ambulatory Visit: Payer: Self-pay

## 2021-09-18 DIAGNOSIS — M25571 Pain in right ankle and joints of right foot: Secondary | ICD-10-CM

## 2021-09-18 DIAGNOSIS — M7751 Other enthesopathy of right foot: Secondary | ICD-10-CM | POA: Diagnosis not present

## 2021-09-18 DIAGNOSIS — M779 Enthesopathy, unspecified: Secondary | ICD-10-CM

## 2021-09-18 DIAGNOSIS — R051 Acute cough: Secondary | ICD-10-CM

## 2021-09-18 MED ORDER — TRIAMCINOLONE ACETONIDE 10 MG/ML IJ SUSP
10.0000 mg | Freq: Once | INTRAMUSCULAR | Status: AC
  Administered 2021-09-18: 10 mg

## 2021-09-18 MED ORDER — PSEUDOEPH-BROMPHEN-DM 30-2-10 MG/5ML PO SYRP: 5.0000 mL | ORAL_SOLUTION | Freq: Four times a day (QID) | ORAL | 0 refills | Status: AC | PRN

## 2021-09-18 NOTE — Progress Notes (Signed)
?Virtual Visit Consent  ? ?Brenda Tyler, you are scheduled for a virtual visit with a [facility] provider today.   ?  ?Just as with appointments in the office, your consent must be obtained to participate.  Your consent will be active for this visit and any virtual visit you may have with one of our providers in the next 365 days.   ?  ?If you have a MyChart account, a copy of this consent can be sent to you electronically.  All virtual visits are billed to your insurance company just like a traditional visit in the office.   ? ?As this is a virtual visit, video technology does not allow for your provider to perform a traditional examination.  This may limit your provider's ability to fully assess your condition.  If your provider identifies any concerns that need to be evaluated in person or the need to arrange testing (such as labs, EKG, etc.), we will make arrangements to do so.   ?  ?Although advances in technology are sophisticated, we cannot ensure that it will always work on either your end or our end.  If the connection with a video visit is poor, the visit may have to be switched to a telephone visit.  With either a video or telephone visit, we are not always able to ensure that we have a secure connection.    ? ?I need to obtain your verbal consent now.   Are you willing to proceed with your visit today?  ?  ?TINSLEIGH LARANCE has provided verbal consent on 09/18/2021 for a virtual visit (video or telephone). ?  ?Mar Daring, PA-C  ? ?Date: 09/18/2021 6:40 PM ? ? ?Virtual Visit via Video Note  ? ?Brenda Tyler, connected with  MING CANAVAN  ([id number], 1986/02/06) on 09/18/21 at  6:30 PM EDT by a video-enabled telemedicine application and verified that I am speaking with the correct person using two identifiers. ? ?Location: ?Patient: Virtual Visit Location Patient: Home ?Provider: Virtual Visit Location Provider: Home Office ?  ?I discussed the limitations of evaluation and management  by telemedicine and the availability of in person appointments. The patient expressed understanding and agreed to proceed.   ? ?History of Present Illness: ?Brenda Tyler is a 36 y.o. who identifies as a female who was assigned female at birth, and is being seen today for cough. ? ?HPI: Cough ?This is a new problem. The current episode started 1 to 4 weeks ago (was [facility] at [facility] on [facility], reports improving just asking for a refill of Bromfed DM cough syrup). The problem has been gradually improving. The problem occurs every few hours. The cough is Productive of sputum. Pertinent negatives include no chills, fever, headaches, myalgias, nasal congestion, postnasal drip, rhinorrhea, shortness of breath or wheezing. The symptoms are aggravated by lying down. She has tried prescription cough suppressant for the symptoms. The treatment provided significant relief.   ? ? ?Problems:  ?Patient Active Problem List  ? Diagnosis Date Noted  ? Pre-diabetes 11/02/2020  ? Vitamin D deficiency 11/02/2020  ? At risk for diabetes mellitus 11/02/2020  ? Acute intractable headache 02/23/2020  ? Dizziness and giddiness 02/23/2020  ? Cyst of bone of left hand 12/10/2019  ? Visit for routine gyn exam 07/30/2019  ? Desire for pregnancy 07/30/2019  ? Dislocation of knee, anterior, left, closed 01/21/2016  ? Closed anterior dislocation of left knee 01/21/2016  ? Depression 07/26/2014  ? Herpes labialis 07/03/2014  ?  HIV disease ([place]) 04/23/2014  ? General counseling for prescription of oral contraceptives 06/11/2013  ? 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 Obesity 12/22/2012  ?  ?Allergies:  ?Allergies  ?Allergen Reactions  ? Dilaudid [Hydromorphone Hcl] Other (See Comments)  ?  Makes her dizzy  ? ?Medications:  ?Current Outpatient Medications:  ?  brompheniramine-pseudoephedrine-DM 30-2-10 MG/5ML syrup, Take 5 mLs by mouth 4 (four) times daily as needed for up to 7 days., Disp: 140 mL, Rfl: 0 ?  ondansetron (ZOFRAN-ODT) 4 MG disintegrating  tablet, Take 1 tablet (4 mg total) by mouth every 8 (eight) hours as needed for nausea or vomiting., Disp: 20 tablet, Rfl: 0 ?  SYMTUZA 800-150-200-10 MG TABS, TAKE 1 TABLET BY MOUTH DAILY WITH BREAKFAST., Disp: 30 tablet, Rfl: 8 ? ?Observations/Objective: ?Patient is well-developed, well-nourished in no acute distress.  ?Resting comfortably at home.  ?Head is normocephalic, atraumatic.  ?No labored breathing.  ?Speech is clear and coherent with logical content.  ?Patient is alert and oriented at baseline.  ? ? ?Assessment and Plan: ?1. Acute cough ?- brompheniramine-pseudoephedrine-DM 30-2-10 MG/5ML syrup; Take 5 mLs by mouth 4 (four) times daily as needed for up to 7 days.  Dispense: 140 mL; Refill: 0 ? ?- Bromfed DM refilled ?- Push fluids ?- Rest ?- Steam and humidifier can help ?- Follow up if symptoms worsen or continue to not improve ? ?Follow Up Instructions: ?I discussed the assessment and treatment plan with the patient. The patient was provided an opportunity to ask questions and all were answered. The patient agreed with the plan and demonstrated an understanding of the instructions.  A copy of instructions were sent to the patient via MyChart unless otherwise noted below.  ? ? ?The patient was advised to call back or seek an in-person evaluation if the symptoms worsen or if the condition fails to improve as anticipated. ? ?Time:  ?I spent 8 minutes with the patient via telehealth technology discussing the above problems/concerns.   ? ?Mar Daring, PA-C ?
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
## 2021-09-18 NOTE — Progress Notes (Signed)
Dg  

## 2021-09-18 NOTE — Patient Instructions (Signed)
?[name], thank you for joining Margaretann Loveless, PA-C for today's virtual visit.  While this provider is not your primary care provider (PCP), if your PCP is located in our provider database this encounter information will be shared with them immediately following your visit. ? ?Consent: ?(Patient) Brenda Tyler provided verbal consent for this virtual visit at the beginning of the encounter. ? ?Current Medications: ? ?Current Outpatient Medications:  ?  brompheniramine-pseudoephedrine-DM 30-2-10 MG/5ML syrup, Take 5 mLs by mouth 4 (four) times daily as needed for up to 7 days., Disp: 140 mL, Rfl: 0 ?  ondansetron (ZOFRAN-ODT) 4 MG disintegrating tablet, Take 1 tablet (4 mg total) by mouth every 8 (eight) hours as needed for nausea or vomiting., Disp: 20 tablet, Rfl: 0 ?  SYMTUZA 800-150-200-10 MG TABS, TAKE 1 TABLET BY MOUTH DAILY WITH BREAKFAST., Disp: 30 tablet, Rfl: 8  ? ?Medications ordered in this encounter:  ?Meds ordered this encounter  ?Medications  ? brompheniramine-pseudoephedrine-DM 30-2-10 MG/5ML syrup  ?  Sig: Take 5 mLs by mouth 4 (four) times daily as needed for up to 7 days.  ?  Dispense:  140 mL  ?  Refill:  0  ?  Order Specific Question:   Supervising Provider  ?  Answer:   Eber Hong [[id number]]  ?  ? ?*If you need refills on other medications prior to your next appointment, please contact your pharmacy* ? ?Follow-Up: ?Call back or seek an in-person evaluation if the symptoms worsen or if the condition fails to improve as anticipated. ? ?Other Instructions ? ?Cough, Adult ?Coughing is a reflex that clears your throat and your airways (respiratory system). Coughing helps to heal and protect your lungs. It is normal to cough occasionally, but a cough that happens with other symptoms or lasts a long time may be a sign of a condition that needs treatment. An acute cough may only last 2-3 weeks, while a chronic cough may last 8 or more weeks. ?Coughing is commonly caused by: ?Infection of  the respiratory systemby viruses or bacteria. ?Breathing in substances that irritate your lungs. ?Allergies. ?Asthma. ?Mucus that runs down the back of your throat (postnasal drip). ?Smoking. ?Acid backing up from the stomach into the esophagus (gastroesophageal reflux). ?Certain medicines. ?Chronic lung problems. ?Other medical conditions such as heart failure or a blood clot in the lung (pulmonary embolism). ?Follow these instructions at home: ?Medicines ?Take over-the-counter and prescription medicines only as told by your health care provider. ?Talk with your health care provider before you take a cough suppressant medicine. ?Lifestyle ? ?Avoid cigarette smoke. Do not use any products that contain nicotine or tobacco, such as cigarettes, e-cigarettes, and chewing tobacco. If you need help quitting, ask your health care provider. ?Drink enough fluid to keep your urine pale yellow. ?Avoid caffeine. ?Do not drink alcohol if your health care provider tells you not to drink. ?General instructions ? ?Pay close attention to changes in your cough. Tell your health care provider about them. ?Always cover your mouth when you cough. ?Avoid things that make you cough, such as perfume, candles, cleaning products, or campfire or tobacco smoke. ?If the air is dry, use a cool mist vaporizer or humidifier in your bedroom or your home to help loosen secretions. ?If your cough is worse at night, try to sleep in a semi-upright position. ?Rest as needed. ?Keep all follow-up visits as told by your health care provider. This is important. ?Contact a health care provider if you: ?Have new symptoms. ?  Cough up pus. ?Have a cough that does not get better after 2-3 weeks or gets worse. ?Cannot control your cough with cough suppressant medicines and you are losing sleep. ?Have pain that gets worse or pain that is not helped with medicine. ?Have a fever. ?Have unexplained weight loss. ?Have night sweats. ?Get help right away if: ?You cough  up blood. ?You have difficulty breathing. ?Your heartbeat is very fast. ?These symptoms may represent a serious problem that is an emergency. Do not wait to see if the symptoms will go away. Get medical help right away. Call your local emergency services (911 in the [place] Do not drive yourself to the hospital. ?Summary ?Coughing is a reflex that clears your throat and your airways. It is normal to cough occasionally, but a cough that happens with other symptoms or lasts a long time may be a sign of a condition that needs treatment. ?Take over-the-counter and prescription medicines only as told by your health care provider. ?Always cover your mouth when you cough. ?Contact a health care provider if you have new symptoms or a cough that does not get better after 2-3 weeks or gets worse. ?This information is not intended to replace advice given to you by your health care provider. Make sure you discuss any questions you have with your health care provider. ?Document Revised: 06/30/2018 Document Reviewed: 06/30/2018 ?Elsevier Patient Education ? 2022 [employer] ? ? ? ?If you have been instructed to have an in-person evaluation today at a local Urgent Care facility, please use the link below. It will take you to a list of all of our available [facility], including address, phone number and hours of operation. Please do not delay care.  ?[facility] ? ?If you or a family member do not have a primary care provider, use the link below to schedule a visit and establish care. When you choose a [facility] primary care physician or advanced practice provider, you gain a long-term partner in health. ?Find a Primary Care Provider ? ?Learn more about [facility]'s in-office and virtual care options: ?[facility] - Get Care Now ?

## 2021-09-19 ENCOUNTER — Other Ambulatory Visit: Payer: Self-pay | Admitting: Podiatry

## 2021-09-19 DIAGNOSIS — M779 Enthesopathy, unspecified: Secondary | ICD-10-CM

## 2021-10-12 ENCOUNTER — Ambulatory Visit: Payer: BC Managed Care – PPO | Admitting: Infectious Diseases

## 2021-10-13 ENCOUNTER — Ambulatory Visit: Payer: BC Managed Care – PPO | Admitting: Infectious Diseases

## 2021-10-16 ENCOUNTER — Ambulatory Visit (INDEPENDENT_AMBULATORY_CARE_PROVIDER_SITE_OTHER): Payer: BC Managed Care – PPO | Admitting: Podiatry

## 2021-10-16 DIAGNOSIS — M19071 Primary osteoarthritis, right ankle and foot: Secondary | ICD-10-CM

## 2021-10-16 DIAGNOSIS — Q688 Other specified congenital musculoskeletal deformities: Secondary | ICD-10-CM | POA: Diagnosis not present

## 2021-10-16 DIAGNOSIS — S86311A Strain of muscle(s) and tendon(s) of peroneal muscle group at lower leg level, right leg, initial encounter: Secondary | ICD-10-CM | POA: Diagnosis not present
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
## 2021-10-19 ENCOUNTER — Emergency Department (HOSPITAL_COMMUNITY): Payer: BC Managed Care – PPO

## 2021-10-19 ENCOUNTER — Encounter (HOSPITAL_COMMUNITY): Payer: Self-pay | Admitting: Emergency Medicine

## 2021-10-19 ENCOUNTER — Emergency Department (HOSPITAL_COMMUNITY)
Admission: EM | Admit: 2021-10-19 | Discharge: 2021-10-19 | Disposition: A | Discharge status: Home / Self Care | Payer: BC Managed Care – PPO | Attending: Emergency Medicine | Admitting: Emergency Medicine

## 2021-10-19 ENCOUNTER — Other Ambulatory Visit: Payer: Self-pay

## 2021-10-19 DIAGNOSIS — Z21 Asymptomatic human immunodeficiency virus [HIV] infection status: Secondary | ICD-10-CM | POA: Insufficient documentation

## 2021-10-19 DIAGNOSIS — U071 COVID-19: Secondary | ICD-10-CM | POA: Insufficient documentation

## 2021-10-19 DIAGNOSIS — R509 Fever, unspecified: Secondary | ICD-10-CM | POA: Diagnosis present

## 2021-10-19 LAB — RESP PANEL BY RT-PCR (FLU A&B, COVID) ARPGX2
Influenza A by PCR: NEGATIVE
Influenza B by PCR: NEGATIVE
SARS Coronavirus 2 by RT PCR: POSITIVE — AB

## 2021-10-19 LAB — GROUP A STREP BY PCR: Group A Strep by PCR: NOT DETECTED

## 2021-10-19 MED ORDER — ONDANSETRON 4 MG PO TBDP
4.0000 mg | ORAL_TABLET | Freq: Once | ORAL | Status: AC
  Administered 2021-10-19: 4 mg via ORAL
  Filled 2021-10-19: qty 1

## 2021-10-19 MED ORDER — IBUPROFEN 200 MG PO TABS
600.0000 mg | ORAL_TABLET | Freq: Once | ORAL | Status: AC
  Administered 2021-10-19: 600 mg via ORAL
  Filled 2021-10-19: qty 3

## 2021-10-19 NOTE — ED Triage Notes (Signed)
Pt reports headache, chest pain, body aches, chills since yesterday.  ?

## 2021-10-19 NOTE — ED Provider Notes (Signed)
?[facility]-EMERGENCY DEPT ?Provider Note ? ? ?CSN: [id number] ?Arrival date & time: 10/19/21  0700 ? ?  ? ?History ? ?Chief Complaint  ?Patient presents with  ? Flu like symptoms  ? Cough  ? Chest Pain  ? ? ?Brenda Tyler is a 36 y.o. female with a PMHx of HIV who presents to the ED complaining of flu-like symptoms onset yesterday. Denies sick contacts. Has associated headache, chills, subjective fever, chest congestion, generalized body aches, resolved vomiting, nausea. Tried tylenol (last dose last night) without relief of her symptoms. Denies nasal congestion, rhinorrhea, sore throat, trouble swallowing. Denies PMHx of MI, cardiac cath, or stents. Denies PMHx of DM or HTN.  ? ?The history is provided by the patient. No language interpreter was used.  ? ?  ? ?Home Medications ?Prior to Admission medications   ?Medication Sig Start Date End Date Taking? Authorizing Provider  ?ondansetron (ZOFRAN-ODT) 4 MG disintegrating tablet Take 1 tablet (4 mg total) by mouth every 8 (eight) hours as needed for nausea or vomiting. 09/13/21   Leath-Warren, Sadie Haber, NP  ?SYMTUZA 800-150-200-10 MG TABS TAKE 1 TABLET BY MOUTH DAILY WITH BREAKFAST. 12/19/20   Ginnie Smart, MD  ?   ? ?Allergies    ?Dilaudid [hydromorphone hcl]   ? ?Review of Systems   ?Review of Systems  ?Constitutional:  Positive for chills and fever (subjective).  ?HENT:  Negative for congestion, rhinorrhea, sore throat and trouble swallowing.   ?Respiratory:  Positive for cough.   ?Cardiovascular:  Positive for chest pain.  ?Gastrointestinal:  Positive for nausea and vomiting.  ?Genitourinary:  Negative for dysuria, hematuria, vaginal bleeding and vaginal discharge.  ?All other systems reviewed and are negative. ? ?Physical Exam ?Updated Vital Signs ?BP 124/88 (BP Location: Right Arm)   Pulse (!) 116   Temp (!) 101.3 ?F (38.5 ?C) (Oral)   Resp 20   SpO2 95%  ?Physical Exam ?Vitals and nursing note reviewed.  ?Constitutional:   ?    General: She is not in acute distress. ?   Appearance: She is not diaphoretic.  ?HENT:  ?   Head: Normocephalic and atraumatic.  ?   Mouth/Throat:  ?   Pharynx: No oropharyngeal exudate.  ?Eyes:  ?   General: No scleral icterus. ?   Conjunctiva/sclera: Conjunctivae normal.  ?Cardiovascular:  ?   Rate and Rhythm: Normal rate and regular rhythm.  ?   Pulses: Normal pulses.  ?   Heart sounds: Normal heart sounds.  ?Pulmonary:  ?   Effort: Pulmonary effort is normal. No respiratory distress.  ?   Breath sounds: Normal breath sounds. No wheezing.  ?Abdominal:  ?   General: Bowel sounds are normal.  ?   Palpations: Abdomen is soft. There is no mass.  ?   Tenderness: There is no abdominal tenderness. There is no guarding or rebound.  ?Musculoskeletal:     ?   General: Normal range of motion.  ?   Cervical back: Normal range of motion and neck supple.  ?Skin: ?   General: Skin is warm and dry.  ?Neurological:  ?   Mental Status: She is alert.  ?Psychiatric:     ?   Behavior: Behavior normal.  ? ? ?ED Results / Procedures / Treatments   ?Labs ?(all labs ordered are listed, but only abnormal results are displayed) ?Labs Reviewed  ?RESP PANEL BY RT-PCR (FLU A&B, COVID) ARPGX2 - Abnormal; Notable for the following components:  ?    Result  Value  ? SARS Coronavirus 2 by RT PCR POSITIVE (*)   ? All other components within normal limits  ?GROUP A STREP BY PCR  ? ? ?EKG ?EKG Interpretation ? ?Date/Time:  Thursday October 19 2021 07:14:04 EDT ?Ventricular Rate:  116 ?PR Interval:  140 ?QRS Duration: 94 ?QT Interval:  294 ?QTC Calculation: 409 ?R Axis:   14 ?Text Interpretation: Sinus tachycardia Borderline T abnormalities, diffuse leads Confirmed by Virgina Norfolkuratolo, Adam [phone]) on 10/19/2021 8:47:21 AM ? ?Radiology ?DG Chest 2 View ? ?Result Date: 10/19/2021 ?CLINICAL DATA:  Headache, chest pain, cough, chills EXAM: CHEST - 2 VIEW COMPARISON:  Chest radiograph 10/08/2020 FINDINGS: The cardiomediastinal silhouette is normal. There is no focal  consolidation or pulmonary edema. There is no pleural effusion or pneumothorax. There is no acute osseous abnormality. IMPRESSION: No radiographic evidence of acute cardiopulmonary process. Electronically Signed   By: Lesia HausenPeter  Noone M.D.   On: 10/19/2021 07:47   ? ?Procedures ?Procedures  ? ? ?Medications Ordered in ED ?Medications  ?ibuprofen (ADVIL) tablet 600 mg (600 mg Oral Given 10/19/21 0817)  ?ondansetron (ZOFRAN-ODT) disintegrating tablet 4 mg (4 mg Oral Given 10/19/21 0817)  ? ? ?ED Course/ Medical Decision Making/ A&P ?Clinical Course as of 10/19/21 2031  ?Thu Oct 19, 2021  ?0903 Re-evaluated and patient noted mild improvement of her symptoms with treatment plan. Discussed negative strep, CXR, and EKG with patient. [SB]  ?1008 SARS Coronavirus 2 by RT PCR(!): POSITIVE [SB]  ?1019 Consult with clinical pharmacist and ID pharmacist regarding treatment regimen for covid-19 positive test. Recommended Paxlovid at this time. [SB]  ?1023 Discussed positive covid swab with patient. Discussed discharge treatment plan with patient. Pt appears safe for discharge. [SB]  ?  ?Clinical Course User Index ?[SB] Brittie Whisnant A, PA-C  ? ?                        ?Medical Decision Making ?Amount and/or Complexity of Data Reviewed ?Labs:  Decision-making details documented in ED Course. ?Radiology: ordered. ? ?Risk ?OTC drugs. ?Prescription drug management. ? ? ?Pt presents with flu-like symptoms (headache, generalized body aches, subjective fever) onset yesterday. Denies sick contacts. Has a PMHx of HIV and is compliant with her medications. Vital signs, pt febrile at 101.3. On exam, pt without acute cardiovascular, respiratory, abdominal exam findings. Differential diagnosis includes COVID, flu, strep pharyngitis, PNA, viral URI with cough.  ? ? ?Labs:  ?I ordered, and personally interpreted labs.  The pertinent results include:   ?COVID swab positive ?Flu swab negative ?Strep swab negative ? ?Imaging: ?I ordered imaging studies  including CXR  ?I independently visualized and interpreted imaging which showed: no acute cardiopulmonary findings ?I agree with the radiologist interpretation ? ?Medications:  ?I ordered medication including ibuprofen and zofran for symptom management  ?Reevaluation of the patient after these medicines and interventions, I reevaluated the patient and found that they have improved ?I have reviewed the patients home medicines and have made adjustments as needed ? ? ?Disposition: ?Pt presentation suspicious for COVID-19. Doubt flu, PNA, strep pharyngitis, or viral URI with cough at this time. After consideration of the diagnostic results and the patients response to treatment, I feel that the patient would benefit from Discharge home. Pt will be discharged home with Paxlovid. Thorough discussion regarding quarantine/isolation period as per CDC guidelines. Work note provided. Supportive care measures and strict return precautions discussed with patient at bedside. Pt acknowledges and verbalizes understanding. Pt appears safe for discharge. Follow  up as indicated in discharge paperwork.  ? ? ?This chart was dictated using voice recognition software, Dragon. Despite the best efforts of this provider to proofread and correct errors, errors may still occur which can change documentation meaning. ? ?Final Clinical Impression(s) / ED Diagnoses ?Final diagnoses:  ?COVID-19  ? ? ?Rx / DC Orders ?ED Discharge Orders   ? ?      Ordered  ?  nirmatrelvir/ritonavir EUA (PAXLOVID) 20 x 150 MG & 10 x 100MG  TABS  2 times daily       ? Pending  ? ?  ?  ? ?  ? ? ?  ?Serenity Fortner A, PA-C ?10/19/21 2031 ? ?  ?2032, DO ?10/20/21 10/22/21 ? ?

## 2021-10-19 NOTE — Discharge Instructions (Addendum)
It was a pleasure taking care of you!  ? ?Your COVID swab was positive for COVID. Your flu and strep swabs were negative. Your CXR and EKG were unremarkable today. According to the CDC, you must self quarantine for 5 days from the start of your symptoms.  Your quarantine period ends on 10/24/21.  You may take over-the-counter cough and cold medications as needed for your symptoms. Ensure to maintain fluid intake with tea, soup, broth, Pedialyte, Gatorade, water. You may follow-up with your primary care provider as needed.  Return to the Emergency Department if you are experiencing trouble breathing, worsening or increasing chest pain, decreased fluid intake or worsening symptoms. ?

## 2021-10-22 ENCOUNTER — Ambulatory Visit
Admission: RE | Admit: 2021-10-22 | Discharge: 2021-10-22 | Disposition: A | Discharge status: Home / Self Care | Payer: BC Managed Care – PPO | Source: Ambulatory Visit | Attending: Podiatry | Admitting: Podiatry

## 2021-10-22 DIAGNOSIS — Q688 Other specified congenital musculoskeletal deformities: Secondary | ICD-10-CM

## 2021-10-22 DIAGNOSIS — M19071 Primary osteoarthritis, right ankle and foot: Secondary | ICD-10-CM

## 2021-10-22 DIAGNOSIS — S86311A Strain of muscle(s) and tendon(s) of peroneal muscle group at lower leg level, right leg, initial encounter: Secondary | ICD-10-CM

## 2021-10-28 ENCOUNTER — Other Ambulatory Visit: Payer: BC Managed Care – PPO

## 2021-11-02 ENCOUNTER — Encounter: Payer: Self-pay | Admitting: Urgent Care

## 2021-11-02 ENCOUNTER — Telehealth: Payer: BC Managed Care – PPO | Admitting: Urgent Care

## 2021-11-02 DIAGNOSIS — J3089 Other allergic rhinitis: Secondary | ICD-10-CM | POA: Diagnosis not present

## 2021-11-02 DIAGNOSIS — Z8616 Personal history of COVID-19: Secondary | ICD-10-CM | POA: Diagnosis not present

## 2021-11-02 MED ORDER — MONTELUKAST SODIUM 10 MG PO TABS: 10.0000 mg | ORAL_TABLET | Freq: Every day | ORAL | 0 refills | Status: DC

## 2021-11-02 MED ORDER — AZELASTINE HCL 137 MCG/SPRAY NA SOLN: 2.0000 | Freq: Two times a day (BID) | NASAL | 0 refills | Status: DC

## 2021-11-02 NOTE — Progress Notes (Signed)
?Virtual Visit Consent  ? ?Brenda Tyler, you are scheduled for a virtual visit with a [facility] provider today. Just as with appointments in the office, your consent must be obtained to participate. Your consent will be active for this visit and any virtual visit you may have with one of our providers in the next 365 days. If you have a MyChart account, a copy of this consent can be sent to you electronically. ? ?As this is a virtual visit, video technology does not allow for your provider to perform a traditional examination. This may limit your provider's ability to fully assess your condition. If your provider identifies any concerns that need to be evaluated in person or the need to arrange testing (such as labs, EKG, etc.), we will make arrangements to do so. Although advances in technology are sophisticated, we cannot ensure that it will always work on either your end or our end. If the connection with a video visit is poor, the visit may have to be switched to a telephone visit. With either a video or telephone visit, we are not always able to ensure that we have a secure connection. ? ?By engaging in this virtual visit, you consent to the provision of healthcare and authorize for your insurance to be billed (if applicable) for the services provided during this visit. Depending on your insurance coverage, you may receive a charge related to this service. ? ?I need to obtain your verbal consent now. Are you willing to proceed with your visit today? Brenda Tyler has provided verbal consent on 11/02/2021 for a virtual visit (video or telephone). Brenda Pickar L Firmin Belisle, PA ? ?Date: 11/02/2021 5:33 PM ? ?Virtual Visit via Video Note  ? ?I, [name], connected with  Brenda Tyler  ([id number], 1985/10/04) on 11/02/21 at  5:15 PM EDT by a video-enabled telemedicine application and verified that I am speaking with the correct person using two identifiers. ? ?Location: ?Patient: Virtual Visit Location Patient:  Home ?Provider: Virtual Visit Location Provider: Home Office ?  ?I discussed the limitations of evaluation and management by telemedicine and the availability of in person appointments. The patient expressed understanding and agreed to proceed.   ? ?History of Present Illness: ?Brenda Tyler is a 36 y.o. who identifies as a female who was assigned female at birth, and is being seen today for allergies. ? ?HPI: Pleasant 36yo female presents with concern for allergy symptoms. She was dx with Covid-19 on 10/19/21 at [facility]. She states she felt "terrible" then, and those symptoms have improved/resolved. She does however complain of runny eyes, "sniffles", runny nose, and itchy throat. She has had these symptoms roughly one week and feels they are getting worse. She does have a known hx of allergies. She has been taking OTC benadryl and allegra without relief. She has not tried any nasal sprays or eye drops. She denies fever, cough, SOB, stridor, sinus pain or ear pain. ? ? ?Problems:  ?Patient Active Problem List  ? Diagnosis Date Noted  ? Pre-diabetes 11/02/2020  ? Vitamin D deficiency 11/02/2020  ? At risk for diabetes mellitus 11/02/2020  ? Acute intractable headache 02/23/2020  ? Dizziness and giddiness 02/23/2020  ? Cyst of bone of left hand 12/10/2019  ? Visit for routine gyn exam 07/30/2019  ? Desire for pregnancy 07/30/2019  ? Dislocation of knee, anterior, left, closed 01/21/2016  ? Closed anterior dislocation of left knee 01/21/2016  ? Depression 07/26/2014  ? Herpes labialis  07/03/2014  ? HIV disease (HCC) 04/23/2014  ? General counseling for prescription of oral contraceptives 06/11/2013  ? 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 Obesity 12/22/2012  ?  ?Allergies:  ?Allergies  ?Allergen Reactions  ? Dilaudid [Hydromorphone Hcl] Other (See Comments)  ?  Makes her dizzy  ? ?Medications:  ?Current Outpatient Medications:  ?  Azelastine HCl 137 MCG/SPRAY SOLN, Place 2 sprays into the nose in the morning and  at bedtime., Disp: 30 mL, Rfl: 0 ?  montelukast (SINGULAIR) 10 MG tablet, Take 1 tablet (10 mg total) by mouth at bedtime., Disp: 30 tablet, Rfl: 0 ?  ondansetron (ZOFRAN-ODT) 4 MG disintegrating tablet, Take 1 tablet (4 mg total) by mouth every 8 (eight) hours as needed for nausea or vomiting., Disp: 20 tablet, Rfl: 0 ?  SYMTUZA 800-150-200-10 MG TABS, TAKE 1 TABLET BY MOUTH DAILY WITH BREAKFAST., Disp: 30 tablet, Rfl: 8 ? ?Observations/Objective: ?Patient is well-developed, well-nourished in no acute distress.  ?Resting comfortably NAD at home.  ?Head is normocephalic, atraumatic.  ?No labored breathing. No coughing ?Speech is clear and coherent with logical content.  ?Patient is alert and oriented at baseline.  ?No visible rhinorrhea although pt sniffling upon exam ?No conjunctival erythema or injection. No eye drainage ? ?Assessment and Plan: ?1. Seasonal allergic rhinitis due to other allergic trigger ? ?2. History of COVID-19 ? ?Pt reporting her usual allergy symptoms. Has not responded to allegra alone, so will add montelukast at night and azelastine nasal spray daily. Also recommended humidification, saline sinus rinses. Can use flonase in addition to azelastine if needed. ?Hx covid - dx roughly 2 weeks ago. She denies complications from covid in the past (has had it before) and denies any lower respiratory symptoms today. Defers need for dexamethasone. ? ?Follow Up Instructions: ?I discussed the assessment and treatment plan with the patient. The patient was provided an opportunity to ask questions and all were answered. The patient agreed with the plan and demonstrated an understanding of the instructions.  A copy of instructions were sent to the patient via MyChart unless otherwise noted below.  ? ? ?The patient was advised to call back or seek an in-person evaluation if the symptoms worsen or if the condition fails to improve as anticipated. ? ?Time:  ?I spent 9 minutes with the patient via telehealth  technology discussing the above problems/concerns.   ? ?Celica Kotowski L Garcia [place], PA  ?

## 2021-11-02 NOTE — Patient Instructions (Addendum)
?  [name], thank you for joining Chaney Malling, PA for today's virtual visit.  While this provider is not your primary care provider (PCP), if your PCP is located in our provider database this encounter information will be shared with them immediately following your visit. ? ?Consent: ?(Patient) KERILYNN KARGE provided verbal consent for this virtual visit at the beginning of the encounter. ? ?Current Medications: ? ?Current Outpatient Medications:  ?  Azelastine HCl 137 MCG/SPRAY SOLN, Place 2 sprays into the nose in the morning and at bedtime., Disp: 30 mL, Rfl: 0 ?  montelukast (SINGULAIR) 10 MG tablet, Take 1 tablet (10 mg total) by mouth at bedtime., Disp: 30 tablet, Rfl: 0 ?  ondansetron (ZOFRAN-ODT) 4 MG disintegrating tablet, Take 1 tablet (4 mg total) by mouth every 8 (eight) hours as needed for nausea or vomiting., Disp: 20 tablet, Rfl: 0 ?  SYMTUZA 800-150-200-10 MG TABS, TAKE 1 TABLET BY MOUTH DAILY WITH BREAKFAST., Disp: 30 tablet, Rfl: 8  ? ?Medications ordered in this encounter:  ?Meds ordered this encounter  ?Medications  ? montelukast (SINGULAIR) 10 MG tablet  ?  Sig: Take 1 tablet (10 mg total) by mouth at bedtime.  ?  Dispense:  30 tablet  ?  Refill:  0  ?  Order Specific Question:   Supervising Provider  ?  Answer:   Noemi Chapel [[id number]]  ? Azelastine HCl 137 MCG/SPRAY SOLN  ?  Sig: Place 2 sprays into the nose in the morning and at bedtime.  ?  Dispense:  30 mL  ?  Refill:  0  ?  Order Specific Question:   Supervising Provider  ?  Answer:   Noemi Chapel [[id number]]  ?  ? ?*If you need refills on other medications prior to your next appointment, please contact your pharmacy* ? ?Follow-Up: ?Call back or seek an in-person evaluation if the symptoms worsen or if the condition fails to improve as anticipated. ? ?Other Instructions ?Please continue use of your Allegra OTC. ?You can fill and start taking one tablet of montelukast nightly. ?Start using azelastine nasal spray as prescribed. If  ineffective alone, may also use over the counter flonase nasal spray in addition to your prescription spray today. ?Nasal saline rinses/ washes and humidification are also beneficial. ?Make sure you remain hydrated, drink lots of water. ?You can try over the counter mucinex (without added cough medication or decongestants - just plain guaifenesin) to thin any mucouse or secretions. ?If symptoms worsen or if you develop a fever or shortness of breath, please head to an in person urgent care. ? ? ? ?If you have been instructed to have an in-person evaluation today at a local Urgent Care facility, please use the link below. It will take you to a list of all of our available [facility], including address, phone number and hours of operation. Please do not delay care.  ?[facility] ? ?If you or a family member do not have a primary care provider, use the link below to schedule a visit and establish care. When you choose a [facility] primary care physician or advanced practice provider, you gain a long-term partner in health. ?Find a Primary Care Provider ? ?Learn more about [facility] in-office and virtual care options: ?[facility]  ?

## 2021-11-07 ENCOUNTER — Ambulatory Visit (INDEPENDENT_AMBULATORY_CARE_PROVIDER_SITE_OTHER): Payer: BC Managed Care – PPO | Admitting: Podiatry

## 2021-11-07 DIAGNOSIS — Q688 Other specified congenital musculoskeletal deformities: Secondary | ICD-10-CM

## 2021-11-07 DIAGNOSIS — M10471 Other secondary gout, right ankle and foot: Secondary | ICD-10-CM

## 2021-11-07 MED ORDER — METHYLPREDNISOLONE 4 MG PO TBPK: ORAL_TABLET | ORAL | 0 refills | Status: DC
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
## 2021-11-14 LAB — BASIC METABOLIC PANEL
BUN/Creatinine Ratio: 18 (ref 9–23)
BUN: 16 mg/dL (ref 6–20)
CO2: 21 mmol/L (ref 20–29)
Calcium: 9 mg/dL (ref 8.7–10.2)
Chloride: 104 mmol/L (ref 96–106)
Creatinine, Ser: 0.87 mg/dL (ref 0.57–1.00)
Glucose: 98 mg/dL (ref 70–99)
Potassium: 4.2 mmol/L (ref 3.5–5.2)
Sodium: 141 mmol/L (ref 134–144)
eGFR: 88 mL/min/{1.73_m2} (ref >59)

## 2021-11-14 LAB — SEDIMENTATION RATE: Sed Rate: 10 mm/hr (ref 0–32)

## 2021-11-14 LAB — URIC ACID: Uric Acid: 4 mg/dL (ref 2.6–6.2)

## 2021-11-15 ENCOUNTER — Encounter: Payer: Self-pay | Admitting: Infectious Diseases

## 2021-11-16 ENCOUNTER — Ambulatory Visit: Payer: BC Managed Care – PPO | Admitting: Infectious Diseases

## 2021-12-20 ENCOUNTER — Ambulatory Visit (INDEPENDENT_AMBULATORY_CARE_PROVIDER_SITE_OTHER): Payer: BC Managed Care – PPO | Admitting: Podiatry

## 2021-12-20 DIAGNOSIS — S93491A Sprain of other ligament of right ankle, initial encounter: Secondary | ICD-10-CM

## 2021-12-20 DIAGNOSIS — M25871 Other specified joint disorders, right ankle and foot: Secondary | ICD-10-CM | POA: Diagnosis not present

## 2021-12-20 DIAGNOSIS — M25372 Other instability, left ankle: Secondary | ICD-10-CM | POA: Diagnosis not present

## 2021-12-21 ENCOUNTER — Telehealth: Payer: Self-pay

## 2021-12-21 ENCOUNTER — Other Ambulatory Visit: Payer: Self-pay | Admitting: Infectious Diseases

## 2021-12-21 DIAGNOSIS — B20 Human immunodeficiency virus [HIV] disease: Secondary | ICD-10-CM

## 2021-12-21 MED ORDER — SYMTUZA 800-150-200-10 MG PO TABS: 1.0000 | ORAL_TABLET | Freq: Every day | ORAL | 0 refills | Status: DC

## 2021-12-21 NOTE — Telephone Encounter (Addendum)
Called patient after received refill request for Symtuza. Patient is overdue for an appointment with our office. Has canceled and no showed appointments since December. Attempted to schedule appointment for patient to be seen in office with a different provider. Explained to her that Dr. Ninetta Lights is not longer with [facility] and would be more then welcome to see a provider here. Patient ended call after hearing this.  Will send in 30 day supply  Juanita Laster, RMA

## 2021-12-21 NOTE — Addendum Note (Signed)
Addended by: Juanita Laster on: 12/21/2021 10:00 AM   Modules accepted: Orders
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
## 2022-01-23 ENCOUNTER — Telehealth: Payer: Self-pay

## 2022-01-23 NOTE — Telephone Encounter (Signed)
Brenda Tyler called to cancel her surgery with Dr. Lilian Kapur on 03/30/2022. She stated she would like to wait till next year. Notified Cynthia with [facility] and Dr. Lilian Kapur

## 2022-01-25 ENCOUNTER — Other Ambulatory Visit: Payer: Self-pay

## 2022-01-25 ENCOUNTER — Telehealth: Payer: Self-pay | Admitting: Pharmacist

## 2022-01-25 ENCOUNTER — Ambulatory Visit (INDEPENDENT_AMBULATORY_CARE_PROVIDER_SITE_OTHER): Payer: BC Managed Care – PPO | Admitting: Internal Medicine

## 2022-01-25 DIAGNOSIS — Z6841 Body Mass Index (BMI) 40.0 and over, adult: Secondary | ICD-10-CM

## 2022-01-25 DIAGNOSIS — B20 Human immunodeficiency virus [HIV] disease: Secondary | ICD-10-CM | POA: Diagnosis not present

## 2022-01-25 DIAGNOSIS — F32A Depression, unspecified: Secondary | ICD-10-CM | POA: Diagnosis not present

## 2022-01-25 DIAGNOSIS — E66813 Obesity, class 3: Secondary | ICD-10-CM

## 2022-01-25 MED ORDER — SYMTUZA 800-150-200-10 MG PO TABS: 1.0000 | ORAL_TABLET | Freq: Every day | ORAL | 11 refills | Status: DC

## 2022-01-25 NOTE — Progress Notes (Signed)
Patient Active Problem List   Diagnosis Date Noted   Pre-diabetes 11/02/2020   Vitamin D deficiency 11/02/2020   At risk for diabetes mellitus 11/02/2020   Acute intractable headache 02/23/2020   Dizziness and giddiness 02/23/2020   Cyst of bone of left hand 12/10/2019   Visit for routine gyn exam 07/30/2019   Desire for pregnancy 07/30/2019   Dislocation of knee, anterior, left, closed 01/21/2016   Closed anterior dislocation of left knee 01/21/2016   Depression 07/26/2014   Herpes labialis 07/03/2014   HIV disease (HCC) 04/23/2014   General counseling for prescription of oral contraceptives 06/11/2013   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   METHYLPREDNISOLONE (MEDROL DOSEPAK) 4 MG TBPK TABLET    6 day dose pack - take as directed   MONTELUKAST (SINGULAIR) 10 MG TABLET    Take 1 tablet (10 mg total) by mouth at bedtime.   ONDANSETRON (ZOFRAN-ODT) 4 MG DISINTEGRATING TABLET    Take 1 tablet (4 mg total) by mouth every 8 (eight) hours as needed for nausea or vomiting.  Modified Medications   Modified Medication Previous Medication   DARUNAVIR-COBICISTAT-EMTRICITABINE-TENOFOVIR ALAFENAMIDE (SYMTUZA) 800-150-200-10 MG TABS Darunavir-Cobicistat-Emtricitabine-Tenofovir Alafenamide (SYMTUZA) 800-150-200-10 MG TABS      Take 1 tablet by mouth daily with breakfast.    Take 1 tablet by mouth daily with breakfast.  Discontinued Medications   No medications on file    Subjective: Brenda Tyler is in for her routine HIV follow-up visit.  Her infection was diagnosed in 2015 after she went to [facility] complaining of abdominal pain.  She does not recall being tested prior to that.  At the time of diagnosis her CD4 count was 190 and her HIV viral load was 34,970.  She entered  into care and was started on Stribild but her viral load did not suppress right away.  It appears that she was changed to Genvoya at one-point.  Her viral load was down to 38 and her CD4 count was up to 350 in July 2018.  However her viral load went up to 10,800 in March 2019.  It looks like resistant assays were done but no milligrays inhibitor resistance was noted.  It looks like a genotype was canceled.  Genvoya was changed to [employer].  She has had no problems taking or tolerating it.  She was told recently by her pharmacy that she did not have refills but it did not cause her to miss.  Her last 2 viral loads have been undetectable.  Since learning of her infection she says she has told several close family members and her current female partner.  She says that they are supportive but she does not like to talk about her infection.  She does not like coming here for visits.  She has 3 teenage children that she has not told.  She keeps her Symtuza in a closet and out of sight of her boyfriend and children.  She takes the Symtuza at different times during the day.  She estimates the she misses several doses each month.  She is not sure if her partner has been tested for HIV.  She has questions about Cabenuva injections and prep therapy for her partner.  She has  struggled with depression since she was first diagnosed.  She has seen our behavioral health counselor here on at least 1 occasion but says that talking about her infection seems to make things worse.  She wants more information about the injectable medications that are being advertised for weight loss.  She does not have a PCP.  Review of Systems: Review of Systems  Constitutional:  Negative for fever and weight loss.  Psychiatric/Behavioral:  Positive for depression.     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  on pap 4/15 - s/p Colpo 11/02/13 - CIN II -->    HIV (human immunodeficiency virus infection) ([place])    HSV (herpes simplex virus) infection    Obesity    Oropharyngeal candidiasis 07/05/2014   Other fatigue    Shortness of breath on exertion     Social History   Tobacco Use   Smoking status: Never   Smokeless tobacco: Never  Vaping Use   Vaping Use: Never used  Substance Use Topics   Alcohol use: Yes    Alcohol/week: 2.0 standard drinks of alcohol    Types: 2 Standard drinks or equivalent per week    Comment: occasional   Drug use: Not Currently    Family History  Problem Relation Age of Onset   Heart disease Mother    Sudden death Mother    Stroke Mother    Hypertension Maternal Grandmother    Diabetes Maternal Grandmother    Heart disease Maternal Grandmother    Cancer Maternal Grandmother    Hypertension Paternal Grandmother    Diabetes Paternal Grandmother    Heart disease Paternal Grandmother     Allergies  Allergen Reactions   Dilaudid [Hydromorphone Hcl] Other (See Comments)    Makes her dizzy    Health Maintenance  Topic Date Due   COVID-19 Vaccine (1) Never done   TETANUS/TDAP  Never done   HPV VACCINES (3 - Risk 3-dose SCDM series) 04/10/2020   INFLUENZA VACCINE  01/23/2022   PAP SMEAR-Modifier  10/18/2023   Hepatitis C Screening  Completed   HIV Screening  Completed    Objective:  Vitals:   01/25/22 0949  BP: 121/87  Pulse: 66  Temp: 98.7 F (37.1 C)  TempSrc: Temporal  SpO2: 100%  Weight: (!) 311 lb (141.1 kg)   Body mass index is 52.56 kg/m.  Physical Exam Constitutional:      Comments: He is very quiet and soft-spoken.  She became tearful during the exam when talking about her infection.  Cardiovascular:     Rate and Rhythm: Normal rate and regular rhythm.     Heart sounds: No murmur heard. Pulmonary:     Effort: Pulmonary effort is normal.     Breath sounds: Normal breath sounds.     Lab Results Lab Results  Component  Value Date   WBC 3.8 06/15/2021   HGB 13.8 06/15/2021   HCT 40.3 06/15/2021   MCV 87.4 06/15/2021   PLT 343 06/15/2021    Lab Results  Component Value Date   CREATININE 0.87 11/13/2021   BUN 16 11/13/2021   NA 141 11/13/2021   K 4.2 11/13/2021   CL 104 11/13/2021   CO2 21 11/13/2021    Lab Results  Component Value Date   ALT 11 06/15/2021   AST 11 06/15/2021  ALKPHOS 113 10/08/2020   BILITOT 0.5 06/15/2021    Lab Results  Component Value Date   CHOL 209 (H) 10/04/2020   HDL 56 10/04/2020   LDLCALC 132 (H) 10/04/2020   TRIG 118 10/04/2020   CHOLHDL 3.6 11/19/2019   Lab Results  Component Value Date   LABRPR NON-REACTIVE 01/17/2021   HIV 1 RNA Quant  Date Value  06/15/2021 <20 Copies/mL (H)  01/17/2021 Not Detected Copies/mL  11/19/2019 22 copies/mL (H)   CD4 T Cell Abs (/uL)  Date Value  06/15/2021 392 (L)  01/17/2021 429  11/19/2019 410     Problem List Items Addressed This Visit       Unprioritized   HIV disease (HCC) (Chronic)    Her infection has been under much better control since switching to [place] several years ago.  The importance of not missing doses.  Her depression is making adherence difficult given that she wants to keep any discussion of her infection away from her boyfriend and children whom she lives with.  I told her that the injectable medication is not better than Symtuza and would require much more frequent visits here.  I did have her talk with Marchelle Folks, our pharmacist about testing for partner, prep for her partner and the injections.  She will get repeat blood work today, continue Marketing executive and follow-up in 1 month.      Relevant Medications   Darunavir-Cobicistat-Emtricitabine-Tenofovir Alafenamide (SYMTUZA) 800-150-200-10 MG TABS   Other Relevant Orders   T-helper cells (CD4) count (not at [facility])   HIV-1 RNA quant-no reflex-bld   CBC   Comprehensive metabolic panel   RPR   Lipid panel   Obesity    We will help her get a PCP.       Depression    Her reactive depression is weighing her down.  I asked her to consider further counseling.         Cliffton Asters, MD [facility] for Infectious Disease [facility] [phone] pager   [phone] cell 01/25/2022, 10:33 AM

## 2022-01-25 NOTE — Assessment & Plan Note (Signed)
Her reactive depression is weighing her down.  I asked her to consider further counseling.

## 2022-01-25 NOTE — Assessment & Plan Note (Signed)
Her infection has been under much better control since switching to [place] several years ago.  The importance of not missing doses.  Her depression is making adherence difficult given that she wants to keep any discussion of her infection away from her boyfriend and children whom she lives with.  I told her that the injectable medication is not better than Symtuza and would require much more frequent visits here.  I did have her talk with Marchelle Folks, our pharmacist about testing for partner, prep for her partner and the injections.  She will get repeat blood work today, continue Marketing executive and follow-up in 1 month.

## 2022-01-25 NOTE — Telephone Encounter (Signed)
Cumulative HIV Genotype Data  Genotype Dates: 2015, 2016, 2019  RT Mutations  K103N, V21I, K32R, V35T, K49R, V60I, W88S, D123G, K173R, Q174K, D177E, G196E, Q207E, R211K, I244M, V245Q, E248N, A272S, K281R, T286A, V292I, I293V, E297AV, E308K  PI Mutations  I13V, K14R, G16E, K20I, M36I, R41K, K43R, I64M, H69K, K70R, L89M  Integrase Mutations  M50I   Interpretation of Genotype Data per [employer] HIV Drug Resistance Database:  Nucleoside RTIs  Abacavir - susceptible Zidovudine - susceptible Emtricitabine - susceptible Lamivudine - susceptible Tenofovir - susceptible   Non-Nucleoside RTIs  Doravirine - susceptible Efavirenz - high-level resistance Etravirine - susceptible Nevirapine - high-level resistance Rilpivirine - susceptible   Protease Inhibitors  Atazanavir - susceptible Darunavir - susceptible Lopinavir - susceptible   Integrase Inhibitors  Bictegravir - susceptible Cabotegravir - susceptible Dolutegravir - susceptible Elvitegravir - susceptible Raltegravir - susceptible   Patient considering Cabenuva but would like to read more about it before transitioning. Will reach out if interested.   Margarite Gouge, PharmD, CPP, BCIDP Clinical Pharmacist Practitioner Infectious Diseases Clinical Pharmacist [facility] for Infectious Disease

## 2022-01-25 NOTE — Assessment & Plan Note (Signed)
We will help her get a PCP.

## 2022-01-26 LAB — T-HELPER CELLS (CD4) COUNT (NOT AT ARMC)
CD4 % Helper T Cell: 34 % (ref 33–65)
CD4 T Cell Abs: 485 /uL (ref 400–1790)

## 2022-01-29 LAB — CBC
HCT: 37 % (ref 35.0–45.0)
Hemoglobin: 12.2 g/dL (ref 11.7–15.5)
MCH: 29.9 pg (ref 27.0–33.0)
MCHC: 33 g/dL (ref 32.0–36.0)
MCV: 90.7 fL (ref 80.0–100.0)
MPV: 10.4 fL (ref 7.5–12.5)
Platelets: 344 10*3/uL (ref 140–400)
RBC: 4.08 10*6/uL (ref 3.80–5.10)
RDW: 12.1 % (ref 11.0–15.0)
WBC: 4 10*3/uL (ref 3.8–10.8)

## 2022-01-29 LAB — LIPID PANEL
Cholesterol: 212 mg/dL — ABNORMAL HIGH (ref <200)
HDL: 53 mg/dL (ref >=50)
LDL Cholesterol (Calc): 139 mg/dL (calc) — ABNORMAL HIGH
Non-HDL Cholesterol (Calc): 159 mg/dL (calc) — ABNORMAL HIGH (ref <130)
Total CHOL/HDL Ratio: 4 (calc) (ref <5.0)
Triglycerides: 97 mg/dL (ref <150)

## 2022-01-29 LAB — COMPREHENSIVE METABOLIC PANEL
AG Ratio: 1.1 (calc) (ref 1.0–2.5)
ALT: 12 U/L (ref 6–29)
AST: 13 U/L (ref 10–30)
Albumin: 3.9 g/dL (ref 3.6–5.1)
Alkaline phosphatase (APISO): 114 U/L (ref 31–125)
BUN: 9 mg/dL (ref 7–25)
CO2: 28 mmol/L (ref 20–32)
Calcium: 9.2 mg/dL (ref 8.6–10.2)
Chloride: 106 mmol/L (ref 98–110)
Creat: 0.77 mg/dL (ref 0.50–0.97)
Globulin: 3.4 g/dL (calc) (ref 1.9–3.7)
Glucose, Bld: 88 mg/dL (ref 65–99)
Potassium: 3.8 mmol/L (ref 3.5–5.3)
Sodium: 140 mmol/L (ref 135–146)
Total Bilirubin: 0.4 mg/dL (ref 0.2–1.2)
Total Protein: 7.3 g/dL (ref 6.1–8.1)

## 2022-01-29 LAB — HIV-1 RNA QUANT-NO REFLEX-BLD
HIV 1 RNA Quant: <20 Copies/mL — ABNORMAL HIGH
HIV-1 RNA Quant, Log: <1.3 Log cps/mL — ABNORMAL HIGH

## 2022-01-29 LAB — RPR: RPR Ser Ql: NONREACTIVE

## 2022-01-30 IMAGING — US US PELVIS COMPLETE
1 series · 14 of 25 positions shown · non-contrast
Comparison: 01/11/2017

CLINICAL DATA: No menses for 10-11 years, secondary amenorrhea

EXAM:
TRANSABDOMINAL ULTRASOUND OF PELVIS
TECHNIQUE: Transabdominal ultrasound examination of the pelvis was performed
including evaluation of the uterus, ovaries, adnexal regions, and
pelvic cul-de-sac. Transvaginal imaging was not ordered.

[Series 1: us pelvis complete · 14 of 35 slices shown]
[im 1/35]
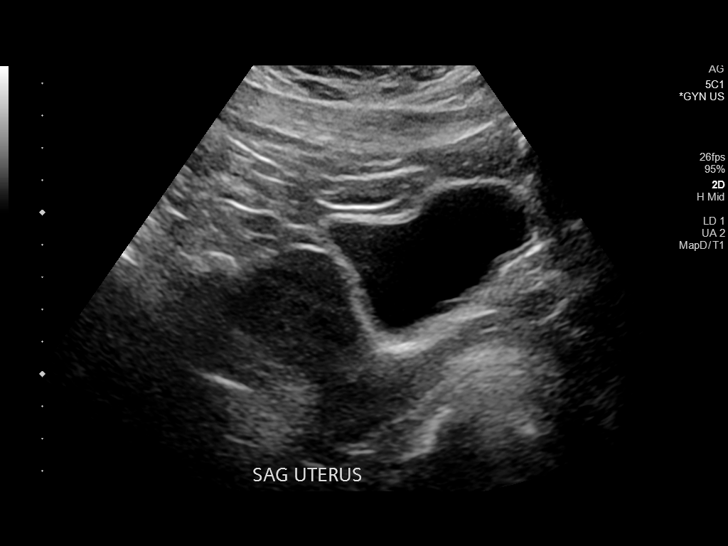
[im 3/35]
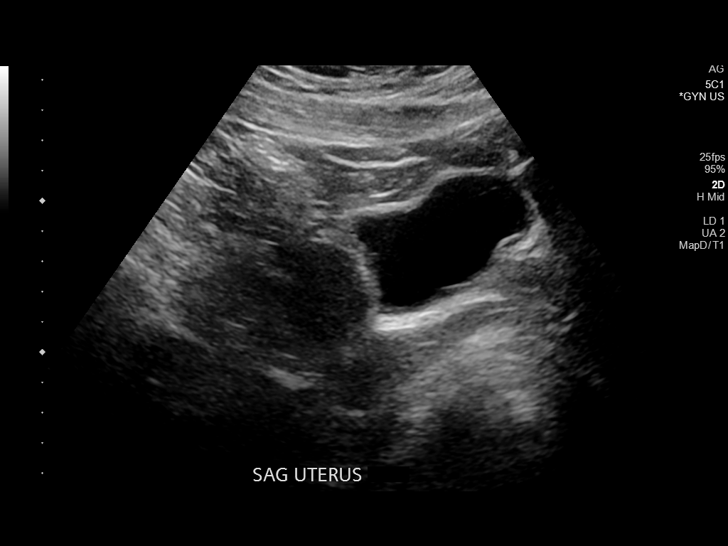
[im 6/35]
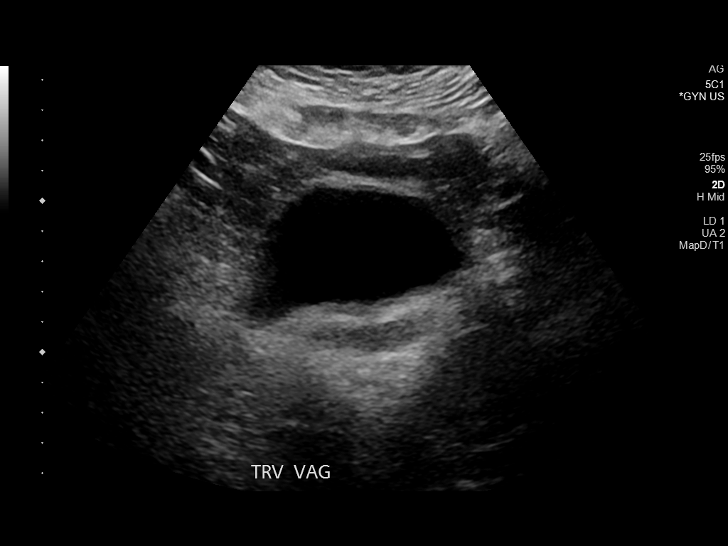
[im 9/35]
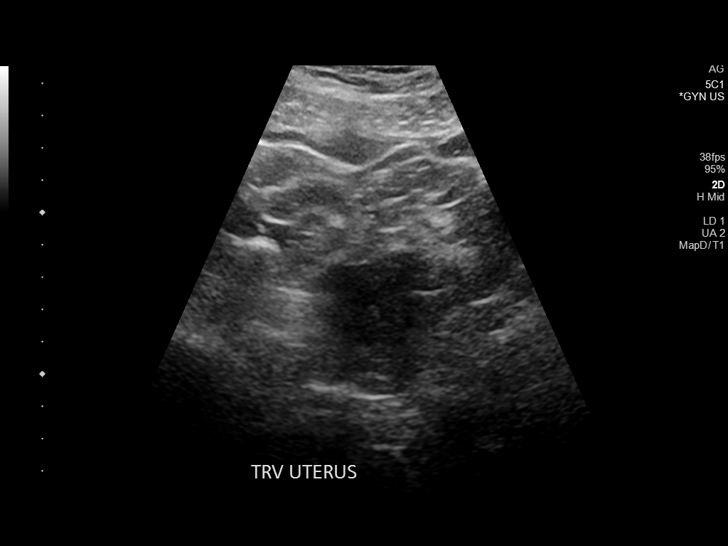
[im 12/35]
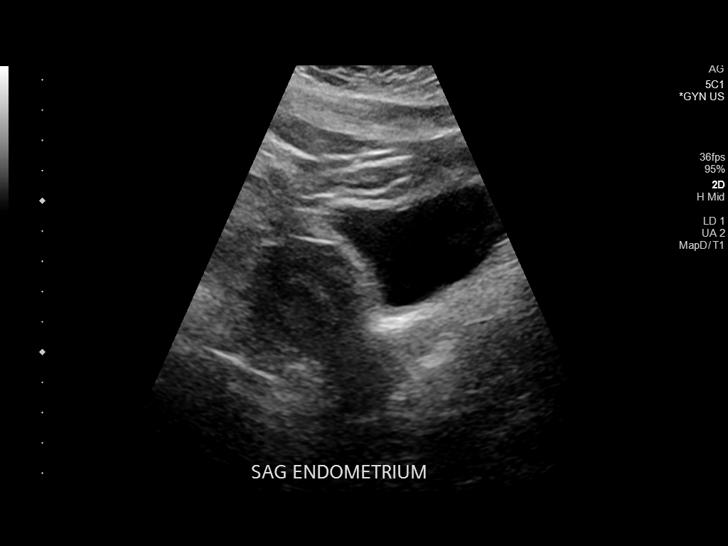
[im 13/35]
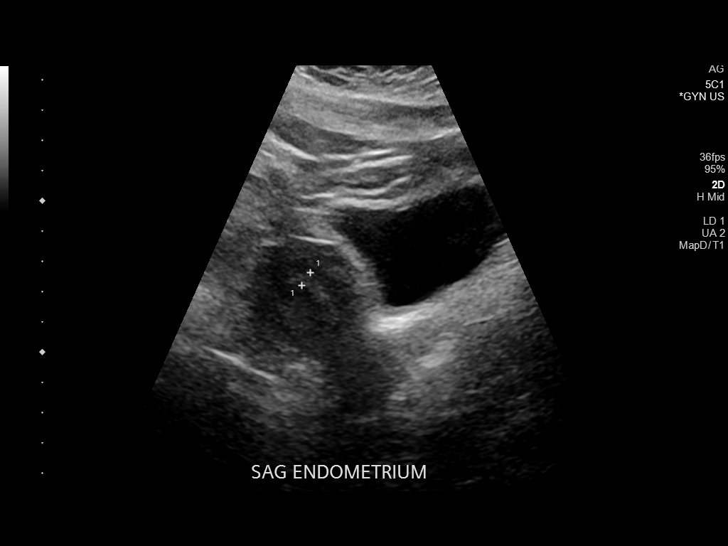
[im 16/35]
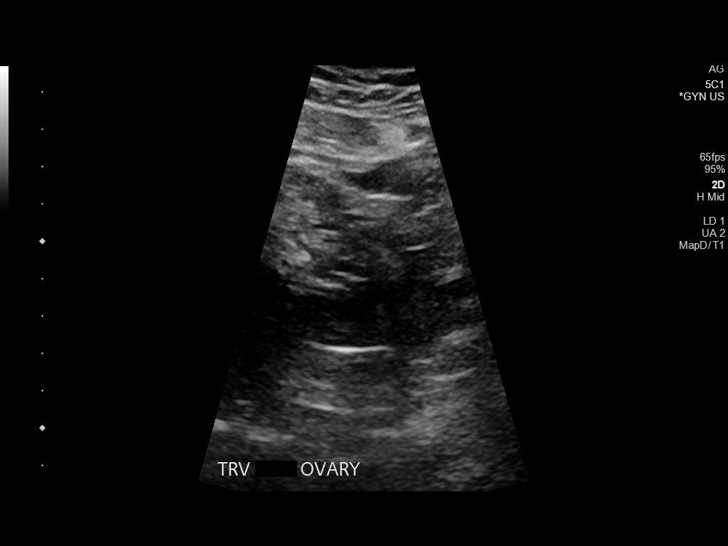
[im 19/35]
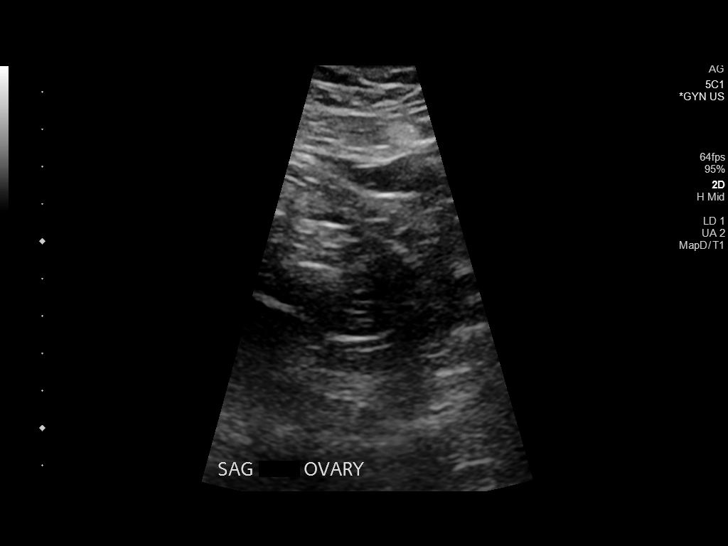
[im 22/35]
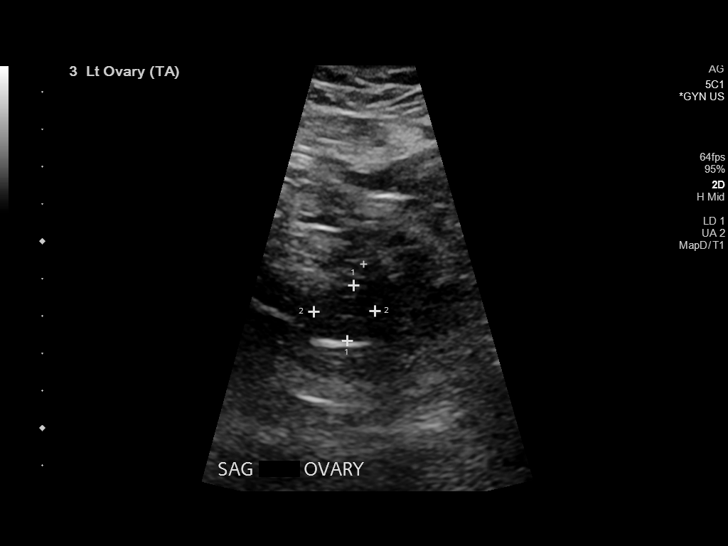
[im 23/35]
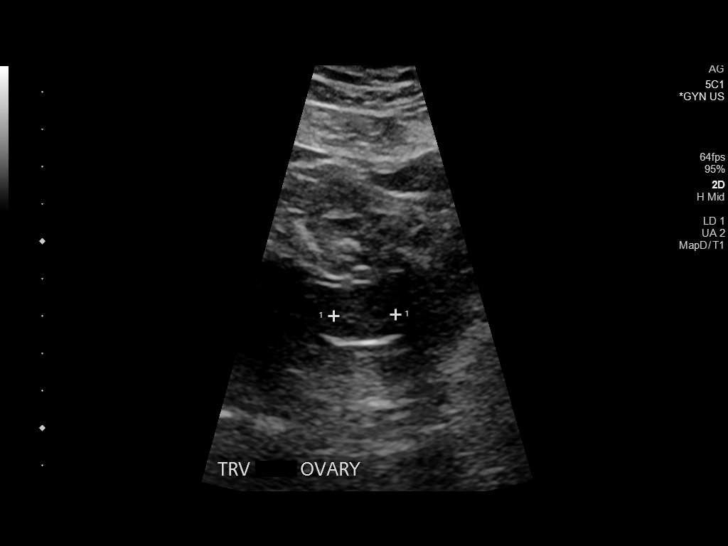
[im 26/35]
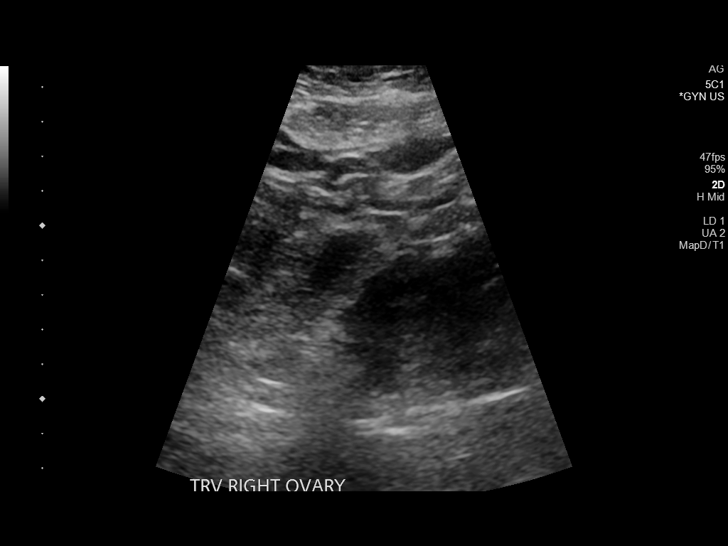
[im 29/35]
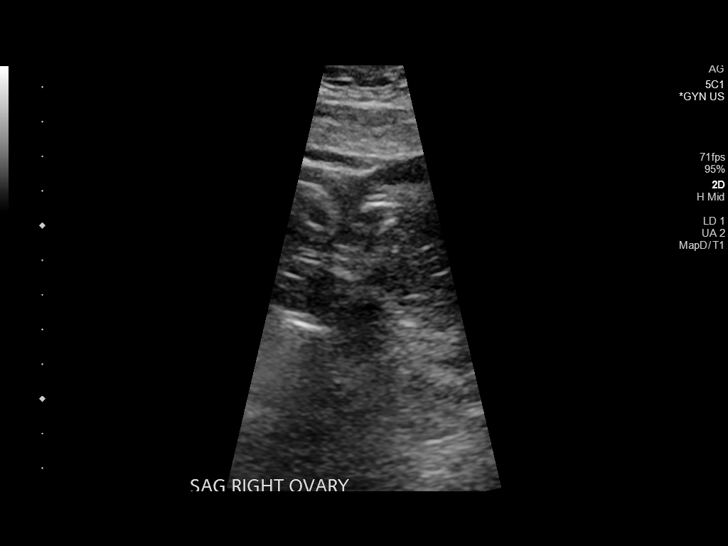
[im 32/35]
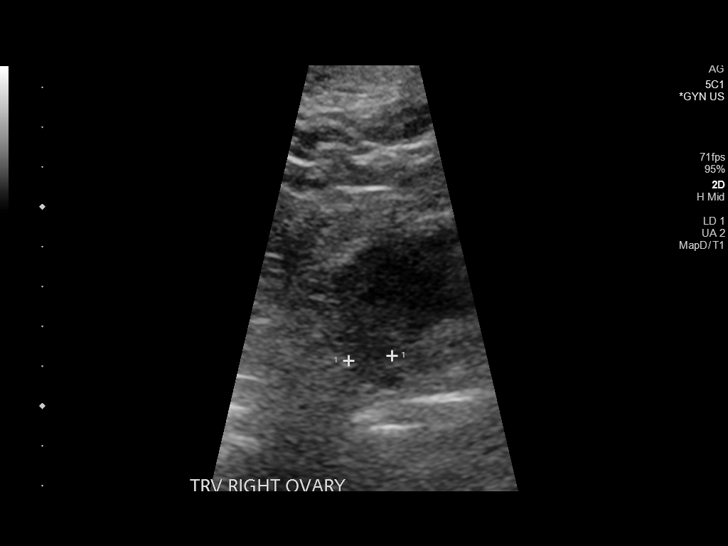
[im 35/35]
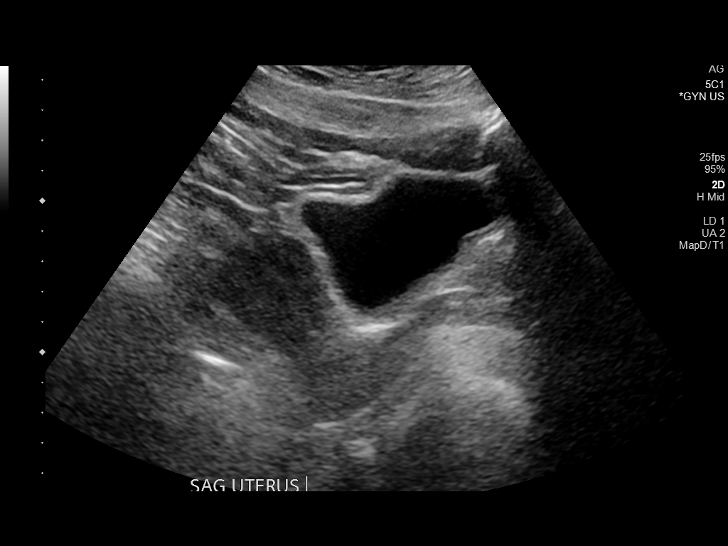

[14 of 25 positions shown; findings below may reference images not displayed]

FINDINGS: Uterus

Measurements: 6.3 x 3.5 x 3.9 cm = volume: 45 mL. Anteverted.
Suboptimally visualized due to body habitus and inadequate bladder
distention/poor acoustic window. No gross mass.

Endometrium

Thickness: 5 mm.  No endometrial fluid or focal abnormality.

Right ovary

Measurements: 1.4 x 1.0 x 1.1 cm = volume: 0.8 mL. Normal morphology
without mass

Left ovary

Measurements: 1.6 x 1.5 x 1.7 cm = volume: 2.1 mL. Normal morphology
without mass

Other findings:  No free pelvic fluid.  No adnexal masses.
IMPRESSION: No pelvic sonographic abnormalities identified.

## 2022-01-31 ENCOUNTER — Encounter (INDEPENDENT_AMBULATORY_CARE_PROVIDER_SITE_OTHER): Payer: Self-pay

## 2022-02-22 ENCOUNTER — Ambulatory Visit: Payer: BC Managed Care – PPO | Admitting: Internal Medicine

## 2022-04-05 ENCOUNTER — Encounter: Payer: BC Managed Care – PPO | Admitting: Podiatry

## 2022-04-19 ENCOUNTER — Encounter: Payer: BC Managed Care – PPO | Admitting: Podiatry

## 2022-05-04 ENCOUNTER — Encounter: Payer: Self-pay | Admitting: Podiatry

## 2022-05-08 ENCOUNTER — Ambulatory Visit: Payer: BC Managed Care – PPO | Admitting: Podiatry

## 2022-05-10 ENCOUNTER — Encounter: Payer: BC Managed Care – PPO | Admitting: Podiatry

## 2022-05-15 ENCOUNTER — Ambulatory Visit (INDEPENDENT_AMBULATORY_CARE_PROVIDER_SITE_OTHER): Payer: BC Managed Care – PPO | Admitting: Podiatry

## 2022-05-15 DIAGNOSIS — M25871 Other specified joint disorders, right ankle and foot: Secondary | ICD-10-CM | POA: Diagnosis not present

## 2022-05-15 DIAGNOSIS — M25371 Other instability, right ankle: Secondary | ICD-10-CM

## 2022-05-15 NOTE — Addendum Note (Signed)
Addended byLilian Kapur, Fayez Sturgell R on: 05/15/2022 01:01 PM   Modules accepted: Orders
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
## 2022-06-26 DIAGNOSIS — M79676 Pain in unspecified toe(s): Secondary | ICD-10-CM

## 2022-07-05 ENCOUNTER — Ambulatory Visit: Payer: BC Managed Care – PPO | Admitting: Internal Medicine

## 2022-07-11 ENCOUNTER — Telehealth: Payer: Self-pay | Admitting: Podiatry

## 2022-07-11 NOTE — Telephone Encounter (Addendum)
DOS: 08/10/2022  BCBS State Effective 06/25/2022 [place] Medicaid [employer]  Ankle Ligament Repair/Stabilization Rt [id number]) Ankle Arthroscopy Rt [id number])  Deductible: $1,250 with $0 met Out-of-Pocket: $4,890 with $0 met CoInsurance: 20%  Prior authorization is not required per Shenna H. Call Reference #: [id number]  Prior authorization is not required for [employer] per Helene Kelp after Caryl Pina C called. Call Reference #: [id number]

## 2022-07-17 ENCOUNTER — Ambulatory Visit (HOSPITAL_COMMUNITY)
Admission: EM | Admit: 2022-07-17 | Discharge: 2022-07-17 | Disposition: A | Discharge status: Home / Self Care | Payer: BC Managed Care – PPO | Attending: Emergency Medicine | Admitting: Emergency Medicine

## 2022-07-17 ENCOUNTER — Encounter (HOSPITAL_COMMUNITY): Payer: Self-pay

## 2022-07-17 DIAGNOSIS — B009 Herpesviral infection, unspecified: Secondary | ICD-10-CM

## 2022-07-17 MED ORDER — ACYCLOVIR 800 MG PO TABS: 400.0000 mg | ORAL_TABLET | Freq: Two times a day (BID) | ORAL | 0 refills | Status: DC

## 2022-07-17 MED ORDER — DOCOSANOL 10 % EX CREA: TOPICAL_CREAM | CUTANEOUS | 0 refills | Status: DC

## 2022-07-17 NOTE — Discharge Instructions (Addendum)
Acyclovir sent to the pharmacy, this is an antiviral medicine for herpes, you will take this medication 2 times a day for the next 5 days, in the morning and in the evening. Abreva cream has been sent to the pharmacy, you will place this on the affected area 3 times daily until healed, do not use this cream for longer than 10 days.  Please follow-up with your primary care doctor or with this office if your symptoms are not improving.

## 2022-07-17 NOTE — ED Provider Notes (Signed)
[facility]    CSN: [id number] Arrival date & time: 07/17/22  0801      History   Chief Complaint Chief Complaint  Patient presents with   Oral Swelling    HPI Brenda Tyler is a 37 y.o. female.  Patient presents complaining of right upper lip pain and swelling that started yesterday evening.  Patient reports that the site is increasingly painful and has a "itching and tingling"  sensation.  Patient denies any fall or trauma.  Patient denies any drainage at the site.  Patient denies any change in products that could have caused her symptoms.  Patient denies any throat pain, difficulty swallowing, fever, gastrointestinal symptoms, chills, or any systemic symptoms.  History of HSV, she states that she had a similar episode years ago.  Patient has a history of HIV and she states that she continues to follow-up with infectious disease.  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 - s/p Colpo 11/02/13 - CIN II -->    HIV (human immunodeficiency virus infection) ([place])    HSV (herpes simplex virus) infection    Obesity    Oropharyngeal candidiasis 07/05/2014   Other fatigue    Shortness of breath on exertion     Patient Active Problem List   Diagnosis Date Noted   Pre-diabetes 11/02/2020   Vitamin D deficiency 11/02/2020   At risk for diabetes mellitus 11/02/2020   Acute intractable headache 02/23/2020   Dizziness and giddiness 02/23/2020   Cyst of bone of left hand 12/10/2019   Visit for routine gyn exam 07/30/2019   Desire for pregnancy 07/30/2019   Dislocation of knee, anterior, left, closed 01/21/2016   Closed anterior dislocation of left knee 01/21/2016   Depression 07/26/2014   Herpes labialis 07/03/2014   HIV disease ([place]) 04/23/2014   General counseling for prescription of  oral contraceptives 06/11/2013   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   Obesity 12/22/2012    Past Surgical History:  Procedure Laterality Date   INCISION AND DRAINAGE ABSCESS     on abdomen and buttocks    OB History     Gravida  4   Para  3   Term  3   Preterm      AB  1   Living  3      SAB      IAB  1   Ectopic      Multiple      Live Births               Home Medications    Prior to Admission medications   Medication Sig Start Date End Date Taking? Authorizing Provider  acyclovir (ZOVIRAX) 800 MG tablet Take 0.5 tablets (400 mg total) by mouth 2 (two) times daily. 07/17/22  Yes Flossie Dibble, NP  Docosanol 10 % CREA Please some Abreva cream on the affected area 3 times daily until healed. 07/17/22  Yes Flossie Dibble, NP  Darunavir-Cobicistat-Emtricitabine-Tenofovir Alafenamide [facility]) 800-150-200-10 MG TABS Take 1 tablet by mouth daily with breakfast. 01/25/22   Michel Bickers, MD    Family History Family History  Problem Relation Age of Onset   Heart disease Mother    Sudden death Mother    Stroke Mother    Hypertension Maternal  Grandmother    Diabetes Maternal Grandmother    Heart disease Maternal Grandmother    Cancer Maternal Grandmother    Hypertension Paternal Grandmother    Diabetes Paternal Grandmother    Heart disease Paternal Grandmother     Social History Social History   Tobacco Use   Smoking status: Never   Smokeless tobacco: Never  Vaping Use   Vaping Use: Never used  Substance Use Topics   Alcohol use: Yes    Alcohol/week: 2.0 standard drinks of alcohol    Types: 2 Standard drinks or equivalent per week    Comment: occasional   Drug use: Not Currently     Allergies   Dilaudid [hydromorphone hcl]   Review of Systems Review of Systems Per HPI  Physical Exam Triage Vital Signs ED Triage Vitals  Enc Vitals Group     BP 07/17/22 0817 117/76     Pulse Rate 07/17/22 0817 81     Resp 07/17/22 0817 18      Temp 07/17/22 0817 98.3 F (36.8 C)     Temp Source 07/17/22 0817 Oral     SpO2 07/17/22 0817 97 %     Weight --      Height --      Head Circumference --      Peak Flow --      Pain Score 07/17/22 0818 10     Pain Loc --      Pain Edu? --      Excl. in GC? --    No data found.  Updated Vital Signs BP 117/76 (BP Location: Right Arm)   Pulse 81   Temp 98.3 F (36.8 C) (Oral)   Resp 18   SpO2 97%     Physical Exam Vitals and nursing note reviewed.  Constitutional:      Appearance: Normal appearance.  HENT:     Mouth/Throat:   Skin:    Findings: Rash present. Rash is vesicular.     Comments: Localized vesicular round rash noted on right upper lip.  Erythema present around base of rash.   Neurological:     Mental Status: She is alert.      UC Treatments / Results  Labs (all labs ordered are listed, but only abnormal results are displayed) Labs Reviewed - No data to display  EKG   Radiology No results found.  Procedures Procedures (including critical care time)  Medications Ordered in UC Medications - No data to display  Initial Impression / Assessment and Plan / UC Course  I have reviewed the triage vital signs and the nursing notes.  Pertinent labs & imaging results that were available during my care of the patient were reviewed by me and considered in my medical decision making (see chart for details).     Patient was evaluated for HSV infection.  Acyclovir 800 mg 5-day course was sent to the pharmacy, patient was made aware of treatment regiment.  Patient requested a topical medication as adjunctive therapy, Abreva prescription was provided, patient was educated on medication regiment.  Patient was made aware of timeline for symptom resolution.  Patient verbalized understanding of instructions.   Charting was provided using a a verbal dictation system, charting was proofread for errors, errors may occur which could change the meaning of the  information charted.   Final Clinical Impressions(s) / UC Diagnoses   Final diagnoses:  Herpes simplex virus (HSV) infection     Discharge Instructions      Acyclovir sent  to the pharmacy, this is an antiviral medicine for herpes, you will take this medication 2 times a day for the next 5 days, in the morning and in the evening. Abreva cream has been sent to the pharmacy, you will place this on the affected area 3 times daily until healed, do not use this cream for longer than 10 days.  Please follow-up with your primary care doctor or with this office if your symptoms are not improving.      ED Prescriptions     Medication Sig Dispense Auth. Provider   acyclovir (ZOVIRAX) 800 MG tablet Take 0.5 tablets (400 mg total) by mouth 2 (two) times daily. 10 tablet Flossie Dibble, NP   Docosanol 10 % CREA Please some Abreva cream on the affected area 3 times daily until healed. 2 g Flossie Dibble, NP      PDMP not reviewed this encounter.   Flossie Dibble, NP 07/17/22 1140

## 2022-07-17 NOTE — ED Triage Notes (Signed)
Patient c/o right upper lip swelling since yesterday evening.   Patient has not used any OTC med.

## 2022-08-10 ENCOUNTER — Other Ambulatory Visit: Payer: Self-pay | Admitting: Podiatry

## 2022-08-10 DIAGNOSIS — M65871 Other synovitis and tenosynovitis, right ankle and foot: Secondary | ICD-10-CM | POA: Diagnosis not present

## 2022-08-10 DIAGNOSIS — M25871 Other specified joint disorders, right ankle and foot: Secondary | ICD-10-CM | POA: Diagnosis not present

## 2022-08-10 DIAGNOSIS — M24671 Ankylosis, right ankle: Secondary | ICD-10-CM | POA: Diagnosis not present

## 2022-08-10 DIAGNOSIS — M25371 Other instability, right ankle: Secondary | ICD-10-CM | POA: Diagnosis not present

## 2022-08-10 DIAGNOSIS — M24271 Disorder of ligament, right ankle: Secondary | ICD-10-CM | POA: Diagnosis not present

## 2022-08-10 MED ORDER — IBUPROFEN 600 MG PO TABS: 600.0000 mg | ORAL_TABLET | Freq: Three times a day (TID) | ORAL | 0 refills | Status: AC | PRN

## 2022-08-10 MED ORDER — ASPIRIN 325 MG PO TBEC: 325.0000 mg | DELAYED_RELEASE_TABLET | Freq: Two times a day (BID) | ORAL | 0 refills | Status: AC

## 2022-08-10 MED ORDER — HYDROCODONE-ACETAMINOPHEN 5-325 MG PO TABS
1.0000 | ORAL_TABLET | ORAL | 0 refills | Status: DC | PRN
Start: 2022-08-10 — End: 2022-08-15

## 2022-08-10 MED ORDER — GABAPENTIN 300 MG PO CAPS
300.0000 mg | ORAL_CAPSULE | Freq: Three times a day (TID) | ORAL | 0 refills | Status: DC
Start: 2022-08-10 — End: 2022-08-14

## 2022-08-10 NOTE — Progress Notes (Signed)
Ankle arthroscopy and lateral ankle stabilization

## 2022-08-13 ENCOUNTER — Telehealth: Payer: Self-pay | Admitting: *Deleted

## 2022-08-13 DIAGNOSIS — R2689 Other abnormalities of gait and mobility: Secondary | ICD-10-CM

## 2022-08-13 DIAGNOSIS — Z9889 Other specified postprocedural states: Secondary | ICD-10-CM

## 2022-08-13 NOTE — Telephone Encounter (Signed)
Patient is requesting an order for home health supplies(bedside commode,bath chair, knee scooter(fax:[phone] )from [facility],recently had surgery on Friday and is doing ok otherwise,please advise.

## 2022-08-14 ENCOUNTER — Other Ambulatory Visit: Payer: Self-pay | Admitting: Podiatry

## 2022-08-15 ENCOUNTER — Telehealth: Payer: Self-pay

## 2022-08-15 MED ORDER — HYDROCODONE-ACETAMINOPHEN 5-325 MG PO TABS: 2.0000 | ORAL_TABLET | ORAL | 0 refills | Status: AC | PRN

## 2022-08-15 NOTE — Telephone Encounter (Signed)
Spoke to the patient and she stated that she is taking the pain medication once every 6 hrs. I advised her that she can take 2 every 4 hrs per provider instructions. Patient agreed. Please send over more refills to patients pharmacy.

## 2022-08-16 ENCOUNTER — Ambulatory Visit (INDEPENDENT_AMBULATORY_CARE_PROVIDER_SITE_OTHER): Payer: BC Managed Care – PPO

## 2022-08-16 ENCOUNTER — Ambulatory Visit (INDEPENDENT_AMBULATORY_CARE_PROVIDER_SITE_OTHER): Payer: BC Managed Care – PPO | Admitting: Podiatry

## 2022-08-16 DIAGNOSIS — S86311A Strain of muscle(s) and tendon(s) of peroneal muscle group at lower leg level, right leg, initial encounter: Secondary | ICD-10-CM

## 2022-08-16 DIAGNOSIS — M25871 Other specified joint disorders, right ankle and foot: Secondary | ICD-10-CM | POA: Diagnosis not present

## 2022-08-16 DIAGNOSIS — Z9889 Other specified postprocedural states: Secondary | ICD-10-CM

## 2022-08-16 NOTE — Patient Instructions (Addendum)
Brenda Tyler [phone]

## 2022-08-20 MED ORDER — CEPHALEXIN 500 MG PO CAPS: 500.0000 mg | ORAL_CAPSULE | Freq: Three times a day (TID) | ORAL | 2 refills | Status: DC

## 2022-08-20 NOTE — Progress Notes (Signed)
  Subjective:  Patient ID: Brenda Tyler, female    DOB: [birth year],  MRN: [id number]  Chief Complaint  Patient presents with   Routine Post Op    POV#1 DOS 02.16.24 ANKLE ARTHROSCOPY & ANKLE STABILIZATION RT - move to boot      37 y.o. female returns for post-op check.  She says she has been in quite severe pain.  She has not been able to get it under control.  She started taking the medication more frequently yesterday after I sent a refill.  Said she has been setting it down when seated  Review of Systems: Negative except as noted in the HPI. Denies N/V/F/Ch.   Objective:  There were no vitals filed for this visit. There is no height or weight on file to calculate BMI. Constitutional Well developed. Well nourished.  Vascular Foot warm and well perfused. Capillary refill normal to all digits.  Calf is soft and supple, no posterior calf or knee pain, negative Homans' sign  Neurologic Normal speech. Oriented to person, place, and time. Epicritic sensation to light touch grossly present bilaterally.  No allodynia or signs of CRPS type I or II  Dermatologic Skin healing well without signs of infection. Skin edges well coapted without signs of infection.  There is some peri-incisional erythema but no ascending cellulitis beyond 1 cm, no drainage  Orthopedic: Tenderness to palpation noted about the surgical site.  Moderate edema   Multiple view plain film radiographs: No osseous abnormalities noted Assessment:   1. Tear of peroneal tendon, right, initial encounter   2. Status post right foot surgery   3. Impingement syndrome of right ankle    Plan:  Patient was evaluated and treated and all questions answered.  S/p foot surgery right -Progressing as expected post-operatively.  She is having quite a bit of pain.  There is some erythema but no definitive signs of infection.  I am placing her on cephalexin as a precaution.  Rx sent.  Continue pain control I discussed with her the  importance of ice and elevation.  She may take the Norco 2 tablets every 4 hours if needed.  I discussed with her that unfortunately her Symtuza precludes some medications from being used such as Toradol or ibuprofen -XR: Noted above -WB Status: NWB in posterior splint.  Transition to cam boot once her symptoms have improved and her pain is controlled -Sutures: Return in 2 weeks to remove. -Medications: Cephalexin sent -Foot redressed.  Return in about 2 weeks (around 08/30/2022) for suture removal, move to boot.

## 2022-08-23 ENCOUNTER — Encounter: Payer: BC Managed Care – PPO | Admitting: Podiatry

## 2022-08-23 ENCOUNTER — Other Ambulatory Visit: Payer: Self-pay | Admitting: Podiatry

## 2022-08-27 ENCOUNTER — Other Ambulatory Visit: Payer: Self-pay | Admitting: Podiatry

## 2022-08-30 ENCOUNTER — Ambulatory Visit (INDEPENDENT_AMBULATORY_CARE_PROVIDER_SITE_OTHER): Payer: BC Managed Care – PPO | Admitting: Podiatry

## 2022-08-30 DIAGNOSIS — M25871 Other specified joint disorders, right ankle and foot: Secondary | ICD-10-CM | POA: Diagnosis not present

## 2022-08-30 DIAGNOSIS — M25371 Other instability, right ankle: Secondary | ICD-10-CM | POA: Diagnosis not present

## 2022-08-30 NOTE — Patient Instructions (Addendum)
You may start walking in the boot on March 18th (Monday)   Call to schedule physical therapy: [facility] at [facility]  [phone]   Start taking the boot off and moving the ankle up and down, left and right, draw circles with it.

## 2022-09-03 ENCOUNTER — Encounter: Payer: Self-pay | Admitting: Podiatry

## 2022-09-03 NOTE — Progress Notes (Signed)
  Subjective:  Patient ID: Brenda Tyler, female    DOB: Aug 11, 1985,  MRN: [id number]  Chief Complaint  Patient presents with   Routine Post Op    POV#2 DOS 02.16.24 ANKLE ARTHROSCOPY & ANKLE STABILIZATION RT      37 y.o. female returns for post-op check.  She is doing much better pain is improving  Review of Systems: Negative except as noted in the HPI. Denies N/V/F/Ch.   Objective:  There were no vitals filed for this visit. There is no height or weight on file to calculate BMI. Constitutional Well developed. Well nourished.  Vascular Foot warm and well perfused. Capillary refill normal to all digits.  Calf is soft and supple, no posterior calf or knee pain, negative Homans' sign  Neurologic Normal speech. Oriented to person, place, and time. Epicritic sensation to light touch grossly present bilaterally.  No allodynia or signs of CRPS type I or II  Dermatologic Incisions are well-healed not hypertrophic  Orthopedic: Tenderness to palpation noted about the surgical site.  Moderate edema, both have improved since last visit   Multiple view plain film radiographs: No osseous abnormalities noted Assessment:   1. Ankle instability, right   2. Impingement syndrome of right ankle    Plan:  Patient was evaluated and treated and all questions answered.  S/p foot surgery right -Doing much better.  She was transition out of the splint today, her sutures removed and she may begin bathing.  May begin range of motion exercises on the ankle.  In 2 weeks may begin weightbearing as well.  I would like her to begin physical therapy and referral was sent for this.  Return in about 3 weeks (around 09/20/2022) for post op (no x-rays).

## 2022-09-06 ENCOUNTER — Encounter: Payer: BC Managed Care – PPO | Admitting: Podiatry

## 2022-09-08 ENCOUNTER — Other Ambulatory Visit: Payer: Self-pay | Admitting: Podiatry

## 2022-09-10 ENCOUNTER — Telehealth: Payer: Self-pay

## 2022-09-10 MED ORDER — HYDROCODONE-ACETAMINOPHEN 5-325 MG PO TABS: 1.0000 | ORAL_TABLET | ORAL | 0 refills | Status: AC | PRN

## 2022-09-10 NOTE — Telephone Encounter (Signed)
Refill hydrocodone/acetaminophen sent to pharmacy

## 2022-09-10 NOTE — Telephone Encounter (Signed)
Patient is calling for pain medicine that was supposed to be sent to pharmacy after last office visit, not  there, please advise.

## 2022-09-10 NOTE — Telephone Encounter (Signed)
Noted, thanks!

## 2022-09-10 NOTE — Telephone Encounter (Signed)
Patient is aware 

## 2022-09-14 ENCOUNTER — Other Ambulatory Visit: Payer: Self-pay | Admitting: Podiatry

## 2022-09-17 NOTE — Therapy (Unsigned)
OUTPATIENT PHYSICAL THERAPY LOWER EXTREMITY EVALUATION   Patient Name: Brenda Tyler MRN: [id number] DOB:[birth year], 37 y.o., female Today's Date: 09/18/2022  END OF SESSION:  PT End of Session - 09/18/22 1319     Visit Number 1    Number of Visits 16    Date for PT Re-Evaluation 11/13/22    Authorization Type BC[employer]    PT Start Time 1215    PT Stop Time 1300    PT Time Calculation (min) 45 min    Activity Tolerance Patient tolerated treatment well;Patient limited by pain    Behavior During Therapy [facility] for tasks assessed/performed             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 - s/p Colpo 11/02/13 - CIN II -->    HIV (human immunodeficiency virus infection) ([place])    HSV (herpes simplex virus) infection    Obesity    Oropharyngeal candidiasis 07/05/2014   Other fatigue    Shortness of breath on exertion    Past Surgical History:  Procedure Laterality Date   INCISION AND DRAINAGE ABSCESS     on abdomen and buttocks   Patient Active Problem List   Diagnosis Date Noted   Pre-diabetes 11/02/2020   Vitamin D deficiency 11/02/2020   At risk for diabetes mellitus 11/02/2020   Acute intractable headache 02/23/2020   Dizziness and giddiness 02/23/2020   Cyst of bone of left hand 12/10/2019   Visit for routine gyn exam 07/30/2019   Desire for pregnancy 07/30/2019   Dislocation of knee, anterior, left, closed 01/21/2016   Closed anterior dislocation of left knee 01/21/2016   Depression 07/26/2014   Herpes labialis 07/03/2014   HIV disease ([place]) 04/23/2014   General counseling for prescription of oral contraceptives 06/11/2013   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  Boot is uncomfortable over time.  PERTINENT HISTORY: 1. Tear of peroneal tendon, right, initial encounter   2. Status post right foot surgery   3. Impingement syndrome of right ankle     1. Ankle instability, right   2. Impingement syndrome of right ankle     Plan:  Patient was evaluated and treated and all questions answered.   S/p foot surgery right -Doing much better.  She was transition out of the splint today, her sutures removed and she may begin bathing.  May begin range of motion exercises on the ankle.  In 2 weeks may begin weightbearing as well.  I would like her to begin physical therapy and referral was sent for this.   Return in about 3 weeks (around 09/20/2022) for post op (no x-rays).       Patient Instructions by Criselda Peaches, DPM at 08/30/2022 1:45 PM  Author: Criselda Peaches, DPM Author Type: Physician Filed: 08/30/2022  2:12 PM  Note Status: Addendum  CosignMickle Mallory Not Required Encounter Date: 08/30/2022  Editor: Criselda Peaches, DPM (Physician)      Prior Versions: 1. Criselda Peaches, DPM (Physician) at 08/30/2022  2:12 PM - Signed  You may start walking in the boot on March 18th (Monday)      PAIN:  Are you having pain? Yes: NPRS scale: 10/10 Pain location: R ankle Pain description: ache Aggravating factors: weight bearing Relieving factors: rest elevation  PRECAUTIONS: You may start walking in the boot on March 18th (Monday)  WEIGHT BEARING RESTRICTIONS: Yes WBAT  FALLS:  Has patient fallen in last 6 months? No  OCCUPATION: Chartered certified accountant  PLOF: Independent  PATIENT GOALS: To return to  ambulation w/o her boot  NEXT MD VISIT: 09/20/22  OBJECTIVE:   DIAGNOSTIC FINDINGS: none available  PATIENT SURVEYS:  LEFS TBD  POSTURE: rounded shoulders and increased thoracic kyphosis  PALPATION: TTP over lateral malleolus and scar  LOWER EXTREMITY ROM:  A/PROM Right eval Left eval  Hip flexion    Hip extension    Hip abduction    Hip adduction    Hip internal rotation    Hip external rotation    Knee flexion    Knee extension    Ankle dorsiflexion -10/0d   Ankle plantarflexion WFL   Ankle inversion 10/22d   Ankle eversion 0/8d    (Blank rows = not tested)  LOWER EXTREMITY MMT:  MMT Right eval Left eval  Hip flexion    Hip extension    Hip abduction    Hip adduction    Hip internal rotation    Hip external rotation    Knee flexion    Knee extension    Ankle dorsiflexion 2   Ankle plantarflexion 2+   Ankle inversion 2   Ankle eversion 2    (Blank rows = not tested)  LOWER EXTREMITY SPECIAL TESTS:  Deferred post-op  FUNCTIONAL TESTS:  Deferred post-op  GAIT: Distance walked: 6ftx2 Assistive device utilized: Crutches Level of assistance: Modified independence Comments: step to pattern   TODAY'S TREATMENT:                                                                                                                              DATE: 09/18/22 Eval and HEP    PATIENT EDUCATION:  Education details: Discussed eval findings, rehab rationale and POC and patient is in agreement  Person educated: Patient Education method: Explanation Education comprehension: verbalized understanding and needs further education  HOME EXERCISE PROGRAM: Access Code: [email or web address] URL: [email or web address] Date: 09/18/2022 Prepared by: Sharlynn Oliphant  Exercises - Long Sitting Plantar Fascia Stretch with Towel  - 3 x daily - 5 x weekly - 1 sets - 2 reps - 30s hold - Towel Scrunches  - 3 x daily - 5 x weekly - 1 sets - 2 reps - 30s hold - Seated Great  Toe Extension  - 3 x daily - 5 x weekly - 2 sets -  30 reps - Seated Lesser Toes Extension  - 3 x daily - 5 x weekly - 2 sets - 3 reps - Seated Ankle Alphabet  - 3 x daily - 5 x weekly - 1 sets - 26 reps - Seated Ankle Circles  - 3 x daily - 5 x weekly - 1 sets - 30 reps  ASSESSMENT:  CLINICAL IMPRESSION: Patient is a 37 y.o. female who was seen today for physical therapy evaluation and treatment for Rankle pain, weakness and ROM deficits following surgical repair of peroneal tendons. She is WBAT in CAM boot using axillary crutches and step to gait.  Pain levels are high and patient is hypersensitive to scar region.  A/PROM and strength decreased in all planes.  Good PT and DP pulses noted, no redness, warmth or color changes observed.  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  OBJECTIVE IMPAIRMENTS: Abnormal gait, decreased activity tolerance, decreased coordination, decreased endurance, decreased knowledge of condition, decreased knowledge of use of DME, decreased mobility, difficulty walking, decreased ROM, decreased strength, increased edema, obesity, and pain.   ACTIVITY LIMITATIONS: carrying, lifting, standing, squatting, stairs, and locomotion level  PERSONAL FACTORS: Fitness, Past/current experiences, Time since onset of injury/illness/exacerbation, and 1 comorbidity: anxiety  are also affecting patient's functional outcome.   REHAB POTENTIAL: Good  CLINICAL DECISION MAKING: Evolving/moderate complexity  EVALUATION COMPLEXITY: Moderate   GOALS: Goals reviewed with patient? No  SHORT TERM GOALS: Target date: 10/16/2022   Patient to demonstrate independence in HEP  Baseline:[email or web address] Goal status: INITIAL  2.  Obtain FOTO Baseline: TBD Goal status: INITIAL  3.  Establish a step through gait pattern Baseline: step to using boot/crutches Goal status: INITIAL   LONG TERM GOALS: Target date: 11/13/2022  Assess FOTO  progress Baseline: TBD Goal status: INITIAL  2.  Decrease pain to 6/10 Baseline: 10/10 Goal status: INITIAL  3.  Ambulate 268ft with LRAD using step through pattern. Baseline: 29ft with R boot and B axillary crutches Goal status: INITIAL  4.  Increase R ankle strength to 3+/5 Baseline:   R 09/18/22 L 09/18/22  Ankle dorsiflexion 2   Ankle plantarflexion 2+   Ankle inversion 2   Ankle eversion 2    Goal status: INITIAL  5.  Increase R ankle AROM to 10d DF, 30d Inv and 20d everison Baseline:  Knee extension R 09/18/22 L 09/18/22  Ankle dorsiflexion -10/0d   Ankle plantarflexion WFL   Ankle inversion 10/22d   Ankle eversion 0/8d    Goal status: INITIAL    PLAN:  PT FREQUENCY: 2x/week  PT DURATION: 8 weeks  PLANNED INTERVENTIONS: Therapeutic exercises, Therapeutic activity, Neuromuscular re-education, Balance training, Gait training, Patient/Family education, Self Care, Joint mobilization, Stair training, DME instructions, Aquatic Therapy, Dry Needling, Manual therapy, and Re-evaluation  PLAN FOR NEXT SESSION: HEP review, FOTO assessment, MD f/u update, ROM, proprioception, desensitization tasks, OK[place] strengthening, gait training   Lanice Shirts, PT 09/18/2022, 1:20 PM

## 2022-09-18 ENCOUNTER — Ambulatory Visit: Payer: BC Managed Care – PPO | Attending: Podiatry

## 2022-09-18 ENCOUNTER — Other Ambulatory Visit: Payer: Self-pay

## 2022-09-18 DIAGNOSIS — M25371 Other instability, right ankle: Secondary | ICD-10-CM | POA: Diagnosis not present

## 2022-09-18 DIAGNOSIS — M25571 Pain in right ankle and joints of right foot: Secondary | ICD-10-CM | POA: Insufficient documentation

## 2022-09-18 DIAGNOSIS — M6281 Muscle weakness (generalized): Secondary | ICD-10-CM | POA: Diagnosis not present

## 2022-09-18 DIAGNOSIS — M25671 Stiffness of right ankle, not elsewhere classified: Secondary | ICD-10-CM | POA: Insufficient documentation

## 2022-09-20 ENCOUNTER — Ambulatory Visit (INDEPENDENT_AMBULATORY_CARE_PROVIDER_SITE_OTHER): Payer: BC Managed Care – PPO | Admitting: Podiatry

## 2022-09-20 ENCOUNTER — Other Ambulatory Visit: Payer: Self-pay | Admitting: Podiatry

## 2022-09-20 DIAGNOSIS — M25371 Other instability, right ankle: Secondary | ICD-10-CM

## 2022-09-20 DIAGNOSIS — M25871 Other specified joint disorders, right ankle and foot: Secondary | ICD-10-CM

## 2022-09-20 NOTE — Progress Notes (Signed)
  Subjective:  Patient ID: Brenda Tyler, female    DOB: 1985-08-04,  MRN: [id number]  Chief Complaint  Patient presents with   Routine Post Op    POV#3 DOS 02.16.24 ANKLE ARTHROSCOPY & ANKLE STABILIZATION RT      37 y.o. female returns for post-op check.  She Started physical therapy.  Having numbness on the top of the foot, pain on the side is improving  Review of Systems: Negative except as noted in the HPI. Denies N/V/F/Ch.   Objective:  There were no vitals filed for this visit. There is no height or weight on file to calculate BMI. Constitutional Well developed. Well nourished.  Vascular Foot warm and well perfused. Capillary refill normal to all digits.  Calf is soft and supple, no posterior calf or knee pain, negative Homans' sign  Neurologic Normal speech. Oriented to person, place, and time. Epicritic sensation to light touch grossly present bilaterally.  No allodynia or signs of CRPS type I or II  Dermatologic Incisions are well-healed not hypertrophic  Orthopedic: Mild edema over dorsal foot, she has good stability of the ankle stabilization no pain to palpation of the surgical site good range of motion of the ankle   Multiple view plain film radiographs: No osseous abnormalities noted Assessment:   1. Ankle instability, right   2. Impingement syndrome of right ankle    Plan:  Patient was evaluated and treated and all questions answered.  S/p foot surgery right -Continuing to improve.  I discussed with her the numbness she is feeling is likely secondary to the edema.  Expect this will resolve with time and motion and conditioning.  I recommended she continue the cam boot for ambulation for the next 2 weeks and then transition to a supportive shoe and Tri-Lock ankle brace.  This was dispensed today.  For physical therapy she may be full weightbearing as tolerated without restrictions.  If he was unable to go back to work as planned next month and I will see her back  in 6 weeks for follow-up we will aim for a late May return to work.  Return in about 6 weeks (around 11/01/2022) for post op (new ankle x-rays).

## 2022-09-20 NOTE — Patient Instructions (Signed)
In 2 weeks you may come out of the boot and wear a lace up ankle brace and sneaker/shoe. For PT you can do any activity you are able to do out of the boot

## 2022-09-26 ENCOUNTER — Ambulatory Visit: Payer: BC Managed Care – PPO | Attending: Obstetrics and Gynecology

## 2022-09-26 DIAGNOSIS — R262 Difficulty in walking, not elsewhere classified: Secondary | ICD-10-CM | POA: Insufficient documentation

## 2022-09-26 DIAGNOSIS — M25571 Pain in right ankle and joints of right foot: Secondary | ICD-10-CM | POA: Diagnosis not present

## 2022-09-26 DIAGNOSIS — M25671 Stiffness of right ankle, not elsewhere classified: Secondary | ICD-10-CM | POA: Insufficient documentation

## 2022-09-26 DIAGNOSIS — M6281 Muscle weakness (generalized): Secondary | ICD-10-CM | POA: Diagnosis not present

## 2022-09-26 NOTE — Therapy (Signed)
OUTPATIENT PHYSICAL THERAPY TREATMENT NOTE   Patient Name: Brenda Tyler MRN: [id number] DOB:15-Oct-1985, 37 y.o., female Today's Date: 09/26/2022  PCP: Paula Compton, MD  REFERRING PROVIDER: Criselda Peaches, DPM   END OF SESSION:   PT End of Session - 09/26/22 1052     Visit Number 2    Number of Visits 16    Date for PT Re-Evaluation 11/13/22    Authorization Type BCBS    PT Start Time 1052    PT Stop Time 1130    PT Time Calculation (min) 38 min    Activity Tolerance Patient tolerated treatment well;Patient limited by pain    Behavior During Therapy [facility] for tasks assessed/performed             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 - s/p Colpo 11/02/13 - CIN II -->    HIV (human immunodeficiency virus infection)    HSV (herpes simplex virus) infection    Obesity    Oropharyngeal candidiasis 07/05/2014   Other fatigue    Shortness of breath on exertion    Past Surgical History:  Procedure Laterality Date   INCISION AND DRAINAGE ABSCESS     on abdomen and buttocks   Patient Active Problem List   Diagnosis Date Noted   Pre-diabetes 11/02/2020   Vitamin D deficiency 11/02/2020   At risk for diabetes mellitus 11/02/2020   Acute intractable headache 02/23/2020   Dizziness and giddiness 02/23/2020   Cyst of bone of left hand 12/10/2019   Visit for routine gyn exam 07/30/2019   Desire for pregnancy 07/30/2019   Dislocation of knee, anterior, left, closed 01/21/2016   Closed anterior dislocation of left knee 01/21/2016   Depression 07/26/2014   Herpes labialis 07/03/2014   HIV disease 04/23/2014   General counseling for prescription of oral contraceptives 06/11/2013   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   Obesity 12/22/2012    REFERRING DIAG: M25.371 (ICD-10-CM) - Ankle  instability, right   THERAPY DIAG:  Pain in right ankle and joints of right foot  Muscle weakness (generalized)  Rationale for Evaluation and Treatment Rehabilitation  PERTINENT HISTORY: R peroneal tendon repair on 08/10/22  PRECAUTIONS: WB on 09/13/22 - DOS 08/10/2022  SUBJECTIVE:                                                                                                                                                                                      SUBJECTIVE  STATEMENT:  Pt presents to PT with reports of R ankle discomfort, although it is less today. Has been compliant with HEP.    PAIN:  Are you having pain?  Yes: NPRS scale: 10/10 Pain location: R ankle Pain description: ache Aggravating factors: weight bearing Relieving factors: rest elevation   OBJECTIVE: (objective measures completed at initial evaluation unless otherwise dated)  DIAGNOSTIC FINDINGS: none available   PATIENT SURVEYS:  LEFS TBD   POSTURE: rounded shoulders and increased thoracic kyphosis   PALPATION: TTP over lateral malleolus and scar   LOWER EXTREMITY ROM:   A/PROM Right eval Left eval  Hip flexion      Hip extension      Hip abduction      Hip adduction      Hip internal rotation      Hip external rotation      Knee flexion      Knee extension      Ankle dorsiflexion -10/0d    Ankle plantarflexion WFL    Ankle inversion 10/22d    Ankle eversion 0/8d     (Blank rows = not tested)   LOWER EXTREMITY MMT:   MMT Right eval Left eval  Hip flexion      Hip extension      Hip abduction      Hip adduction      Hip internal rotation      Hip external rotation      Knee flexion      Knee extension      Ankle dorsiflexion 2    Ankle plantarflexion 2+    Ankle inversion 2    Ankle eversion 2     (Blank rows = not tested)   LOWER EXTREMITY SPECIAL TESTS:  Deferred post-op   FUNCTIONAL TESTS:  Deferred post-op   GAIT: Distance walked: 95ftx2 Assistive device  utilized: Crutches Level of assistance: Modified independence Comments: step to pattern     TREATMENT: [facility] Adult PT Treatment:                                                DATE: 09/26/2022 Therapeutic Exercise: Long sitting ankle pumps x 20 R calf stretch 2x30"  R eversion towel slide x 5 Towel scruch 2x30" R Long sitting ankle 4-way 2x10 YTB Seated heel raise with ball btwn heels 2x10 Standing weight shift 2x10  Standing heel raise 2x10   PATIENT EDUCATION:  Education details: N/A Person educated: Patient Education method: Explanation Education comprehension: verbalized understanding and needs further education   HOME EXERCISE PROGRAM: Access Code: [email or web address] URL: [email or web address] Date: 09/18/2022 Prepared by: Sharlynn Oliphant   Exercises - Long Sitting Plantar Fascia Stretch with Towel  - 3 x daily - 5 x weekly - 1 sets - 2 reps - 30s hold - Towel Scrunches  - 3 x daily - 5 x weekly - 1 sets - 2 reps - 30s hold - Seated Great Toe Extension  - 3 x daily - 5 x weekly - 2 sets - 30 reps - Seated Lesser Toes Extension  - 3 x daily - 5 x weekly - 2 sets - 3 reps - Seated Ankle Alphabet  - 3 x daily - 5 x weekly - 1 sets - 26 reps - Seated Ankle Circles  - 3 x daily - 5 x weekly - 1 sets - 30  reps   ASSESSMENT:   CLINICAL IMPRESSION: Pt was able to complete all prescribed exercises with progression to more out of boot activity. Therapy focused on ankle strength and ROM post surgery. She continues to benefit from skilled PT services, with PT encouraging pt to follow DPM advice and start weaning from boot more.   Eval: Patient is a 37 y.o. female who was seen today for physical therapy evaluation and treatment for Rankle pain, weakness and ROM deficits following surgical repair of peroneal tendons. She is WBAT in CAM boot using axillary crutches and step to gait.  Pain levels are high and patient is hypersensitive to scar region.  A/PROM and strength decreased in  all planes.  Good PT and DP pulses noted, no redness, warmth or color changes observed.  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   OBJECTIVE IMPAIRMENTS: Abnormal gait, decreased activity tolerance, decreased coordination, decreased endurance, decreased knowledge of condition, decreased knowledge of use of DME, decreased mobility, difficulty walking, decreased ROM, decreased strength, increased edema, obesity, and pain.    ACTIVITY LIMITATIONS: carrying, lifting, standing, squatting, stairs, and locomotion level   PERSONAL FACTORS: Fitness, Past/current experiences, Time since onset of injury/illness/exacerbation, and 1 comorbidity: anxiety  are also affecting patient's functional outcome.    REHAB POTENTIAL: Good   CLINICAL DECISION MAKING: Evolving/moderate complexity   EVALUATION COMPLEXITY: Moderate     GOALS: Goals reviewed with patient? No   SHORT TERM GOALS: Target date: 10/16/2022   Patient to demonstrate independence in HEP  Baseline:[email or web address] Goal status: INITIAL   2.  Obtain FOTO Baseline: TBD Goal status: INITIAL   3.  Establish a step through gait pattern Baseline: step to using boot/crutches Goal status: INITIAL     LONG TERM GOALS: Target date: 11/13/2022   Assess FOTO progress Baseline: TBD Goal status: INITIAL   2.  Decrease pain to 6/10 Baseline: 10/10 Goal status: INITIAL   3.  Ambulate 23ft with LRAD using step through pattern. Baseline: 39ft with R boot and B axillary crutches Goal status: INITIAL   4.  Increase R ankle strength to 3+/5 Baseline:    R 09/18/22 L 09/18/22  Ankle dorsiflexion 2    Ankle plantarflexion 2+    Ankle inversion 2    Ankle eversion 2      Goal status: INITIAL   5.  Increase R ankle AROM to 10d DF, 30d Inv and 20d everison Baseline:  Knee extension R 09/18/22 L 09/18/22  Ankle dorsiflexion -10/0d    Ankle plantarflexion WFL    Ankle inversion 10/22d     Ankle eversion 0/8d      Goal status: INITIAL       PLAN:   PT FREQUENCY: 2x/week   PT DURATION: 8 weeks   PLANNED INTERVENTIONS: Therapeutic exercises, Therapeutic activity, Neuromuscular re-education, Balance training, Gait training, Patient/Family education, Self Care, Joint mobilization, Stair training, DME instructions, Aquatic Therapy, Dry Needling, Manual therapy, and Re-evaluation   PLAN FOR NEXT SESSION: HEP review, FOTO assessment, MD f/u update, ROM, proprioception, desensitization tasks, OKC strengthening, gait training     Ward Chatters, PT 09/26/2022, 11:33 AM

## 2022-09-29 ENCOUNTER — Ambulatory Visit: Payer: BC Managed Care – PPO

## 2022-09-29 DIAGNOSIS — M6281 Muscle weakness (generalized): Secondary | ICD-10-CM

## 2022-09-29 DIAGNOSIS — M25571 Pain in right ankle and joints of right foot: Secondary | ICD-10-CM

## 2022-09-29 DIAGNOSIS — M25671 Stiffness of right ankle, not elsewhere classified: Secondary | ICD-10-CM | POA: Diagnosis not present

## 2022-09-29 DIAGNOSIS — R262 Difficulty in walking, not elsewhere classified: Secondary | ICD-10-CM | POA: Diagnosis not present

## 2022-09-29 NOTE — Therapy (Signed)
OUTPATIENT PHYSICAL THERAPY TREATMENT NOTE   Patient Name: Brenda Tyler MRN: [id number] DOB:[birth year], 37 y.o., female Today's Date: 09/29/2022  PCP: Huel Cote, MD  REFERRING PROVIDER: Edwin Cap, DPM   END OF SESSION:   PT End of Session - 09/29/22 1116     Visit Number 3    Number of Visits 16    Date for PT Re-Evaluation 11/13/22    Authorization Type BCBS    PT Start Time 1115    PT Stop Time 1155    PT Time Calculation (min) 40 min    Activity Tolerance Patient tolerated treatment well;Patient limited by pain    Behavior During Therapy [facility] for tasks assessed/performed              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 - s/p Colpo 11/02/13 - CIN II -->    HIV (human immunodeficiency virus infection)    HSV (herpes simplex virus) infection    Obesity    Oropharyngeal candidiasis 07/05/2014   Other fatigue    Shortness of breath on exertion    Past Surgical History:  Procedure Laterality Date   INCISION AND DRAINAGE ABSCESS     on abdomen and buttocks   Patient Active Problem List   Diagnosis Date Noted   Pre-diabetes 11/02/2020   Vitamin D deficiency 11/02/2020   At risk for diabetes mellitus 11/02/2020   Acute intractable headache 02/23/2020   Dizziness and giddiness 02/23/2020   Cyst of bone of left hand 12/10/2019   Visit for routine gyn exam 07/30/2019   Desire for pregnancy 07/30/2019   Dislocation of knee, anterior, left, closed 01/21/2016   Closed anterior dislocation of left knee 01/21/2016   Depression 07/26/2014   Herpes labialis 07/03/2014   HIV disease 04/23/2014   General counseling for prescription of oral contraceptives 06/11/2013   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   Obesity 12/22/2012    REFERRING DIAG: M25.371 (ICD-10-CM) - Ankle  instability, right   THERAPY DIAG:  Pain in right ankle and joints of right foot  Muscle weakness (generalized)  Decreased range of motion of right ankle  Stiffness of right ankle, not elsewhere classified  Difficulty in walking, not elsewhere classified  Rationale for Evaluation and Treatment Rehabilitation  PERTINENT HISTORY: R peroneal tendon repair on 08/10/22  PRECAUTIONS: WB on 09/13/22 - DOS 08/10/2022  SUBJECTIVE:  SUBJECTIVE STATEMENT:  Patient reports that she tried to cook last night but that her foot was swollen, changed colors, and was very painful afterwards. She brought shoe and ASO to session today.    PAIN:  Are you having pain?  Yes: NPRS scale: 5/10 Pain location: R ankle Pain description: ache Aggravating factors: weight bearing Relieving factors: rest elevation   OBJECTIVE: (objective measures completed at initial evaluation unless otherwise dated)  DIAGNOSTIC FINDINGS: none available   PATIENT SURVEYS:  FOTO 09/29/22: 38%   POSTURE: rounded shoulders and increased thoracic kyphosis   PALPATION: TTP over lateral malleolus and scar   LOWER EXTREMITY ROM:   A/PROM Right eval Left eval  Hip flexion      Hip extension      Hip abduction      Hip adduction      Hip internal rotation      Hip external rotation      Knee flexion      Knee extension      Ankle dorsiflexion -10/0d    Ankle plantarflexion WFL    Ankle inversion 10/22d    Ankle eversion 0/8d     (Blank rows = not tested)   LOWER EXTREMITY MMT:   MMT Right eval Left eval  Hip flexion      Hip extension      Hip abduction      Hip adduction      Hip internal rotation      Hip external rotation      Knee flexion      Knee extension      Ankle dorsiflexion 2    Ankle plantarflexion 2+    Ankle  inversion 2    Ankle eversion 2     (Blank rows = not tested)   LOWER EXTREMITY SPECIAL TESTS:  Deferred post-op   FUNCTIONAL TESTS:  Deferred post-op   GAIT: Distance walked: 4975ftx2 Assistive device utilized: Crutches Level of assistance: Modified independence Comments: step to pattern     TREATMENT: [facility] Adult PT Treatment:                                                DATE: 09/29/22 Therapeutic Exercise: Long sitting ankle pumps Rt x 20 R eversion towel slide x 5 Towel scruch 2x30" R Seated heel raise with ball btwn heels 2x10 Standing AP/lateral weight shift in ASO - cues for lifting heel/toes and bending Rt knee with AP shift Self Care: Desensitization techniques, lacing up ASO brace, wearing brace and weaning from boot/crutches as she feels safe to do so   [facility]                                                DATE: 09/26/2022 Therapeutic Exercise: Long sitting ankle pumps x 20 R calf stretch 2x30"  R eversion towel slide x 5 Towel scruch 2x30" R Long sitting ankle 4-way 2x10 YTB Seated heel raise with ball btwn heels 2x10 Standing weight shift 2x10  Standing heel raise 2x10   PATIENT EDUCATION:  Education details: N/A Person educated: Patient Education method: Explanation Education comprehension: verbalized understanding and needs further education   HOME EXERCISE PROGRAM: Access Code: [email or web address] URL: [email or web address]  Date: 09/18/2022 Prepared by: Gustavus Bryant   Exercises - Long Sitting Plantar Fascia Stretch with Towel  - 3 x daily - 5 x weekly - 1 sets - 2 reps - 30s hold - Towel Scrunches  - 3 x daily - 5 x weekly - 1 sets - 2 reps - 30s hold - Seated Great Toe Extension  - 3 x daily - 5 x weekly - 2 sets - 30 reps - Seated Lesser Toes Extension  - 3 x daily - 5 x weekly - 2 sets - 3 reps - Seated Ankle Alphabet  - 3 x daily - 5 x weekly - 1 sets - 26 reps - Seated Ankle Circles  - 3 x daily - 5 x weekly - 1 sets -  30 reps   ASSESSMENT:   CLINICAL IMPRESSION:  Patient presents to PT reporting she tried to cook for the first time since surgery last night and she had a lot of pain from this. Patient brought ASO brace and shoe this session. Session today focused on self care with education on lacing ASO brace, weaning from boot, and exercises including weight shifting in ASO. Will plan to work on early gait in // bars next session. Patient continues to benefit from skilled PT services and should be progressed as able to improve functional independence.    Eval: Patient is a 37 y.o. female who was seen today for physical therapy evaluation and treatment for Rankle pain, weakness and ROM deficits following surgical repair of peroneal tendons. She is WBAT in CAM boot using axillary crutches and step to gait.  Pain levels are high and patient is hypersensitive to scar region.  A/PROM and strength decreased in all planes.  Good PT and DP pulses noted, no redness, warmth or color changes observed.  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   OBJECTIVE IMPAIRMENTS: Abnormal gait, decreased activity tolerance, decreased coordination, decreased endurance, decreased knowledge of condition, decreased knowledge of use of DME, decreased mobility, difficulty walking, decreased ROM, decreased strength, increased edema, obesity, and pain.    ACTIVITY LIMITATIONS: carrying, lifting, standing, squatting, stairs, and locomotion level   PERSONAL FACTORS: Fitness, Past/current experiences, Time since onset of injury/illness/exacerbation, and 1 comorbidity: anxiety  are also affecting patient's functional outcome.    REHAB POTENTIAL: Good   CLINICAL DECISION MAKING: Evolving/moderate complexity   EVALUATION COMPLEXITY: Moderate     GOALS: Goals reviewed with patient? No   SHORT TERM GOALS: Target date: 10/16/2022   Patient to demonstrate independence in HEP   Baseline:[email or web address] Goal status: INITIAL   2.  Obtain FOTO Baseline: TBD Goal status: MET 09/29/22: 38%   3.  Establish a step through gait pattern Baseline: step to using boot/crutches Goal status: INITIAL     LONG TERM GOALS: Target date: 11/13/2022   Assess FOTO progress Baseline: TBD Goal status: INITIAL   2.  Decrease pain to 6/10 Baseline: 10/10 Goal status: INITIAL   3.  Ambulate 234ft with LRAD using step through pattern. Baseline: 74ft with R boot and B axillary crutches Goal status: INITIAL   4.  Increase R ankle strength to 3+/5 Baseline:    R 09/18/22 L 09/18/22  Ankle dorsiflexion 2    Ankle plantarflexion 2+    Ankle inversion 2    Ankle eversion 2      Goal status: INITIAL   5.  Increase R ankle AROM to 10d DF, 30d Inv and 20d  everison Baseline:  Knee extension R 09/18/22 L 09/18/22  Ankle dorsiflexion -10/0d    Ankle plantarflexion WFL    Ankle inversion 10/22d    Ankle eversion 0/8d      Goal status: INITIAL       PLAN:   PT FREQUENCY: 2x/week   PT DURATION: 8 weeks   PLANNED INTERVENTIONS: Therapeutic exercises, Therapeutic activity, Neuromuscular re-education, Balance training, Gait training, Patient/Family education, Self Care, Joint mobilization, Stair training, DME instructions, Aquatic Therapy, Dry Needling, Manual therapy, and Re-evaluation   PLAN FOR NEXT SESSION: HEP review, FOTO assessment, MD f/u update, ROM, proprioception, desensitization tasks, OKC strengthening, gait training     Berta Minor, PTA 09/29/2022, 11:55 AM

## 2022-09-30 ENCOUNTER — Other Ambulatory Visit: Payer: Self-pay | Admitting: Podiatry

## 2022-10-03 ENCOUNTER — Ambulatory Visit: Payer: BC Managed Care – PPO

## 2022-10-03 DIAGNOSIS — M25571 Pain in right ankle and joints of right foot: Secondary | ICD-10-CM

## 2022-10-03 DIAGNOSIS — R262 Difficulty in walking, not elsewhere classified: Secondary | ICD-10-CM | POA: Diagnosis not present

## 2022-10-03 DIAGNOSIS — M25671 Stiffness of right ankle, not elsewhere classified: Secondary | ICD-10-CM | POA: Diagnosis not present

## 2022-10-03 DIAGNOSIS — M6281 Muscle weakness (generalized): Secondary | ICD-10-CM | POA: Diagnosis not present

## 2022-10-03 NOTE — Therapy (Signed)
OUTPATIENT PHYSICAL THERAPY TREATMENT NOTE   Patient Name: Brenda Tyler MRN: [id number] DOB:1986-04-29, 37 y.o., female Today's Date: 10/03/2022  PCP: Huel Cote, MD  REFERRING PROVIDER: Edwin Cap, DPM   END OF SESSION:   PT End of Session - 10/03/22 1359     Visit Number 4    Number of Visits 16    Date for PT Re-Evaluation 11/13/22    Authorization Type BCBS    PT Start Time 1400    PT Stop Time 1440    PT Time Calculation (min) 40 min    Activity Tolerance Patient tolerated treatment well;Patient limited by pain    Behavior During Therapy [facility] for tasks assessed/performed               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 - s/p Colpo 11/02/13 - CIN II -->    HIV (human immunodeficiency virus infection)    HSV (herpes simplex virus) infection    Obesity    Oropharyngeal candidiasis 07/05/2014   Other fatigue    Shortness of breath on exertion    Past Surgical History:  Procedure Laterality Date   INCISION AND DRAINAGE ABSCESS     on abdomen and buttocks   Patient Active Problem List   Diagnosis Date Noted   Pre-diabetes 11/02/2020   Vitamin D deficiency 11/02/2020   At risk for diabetes mellitus 11/02/2020   Acute intractable headache 02/23/2020   Dizziness and giddiness 02/23/2020   Cyst of bone of left hand 12/10/2019   Visit for routine gyn exam 07/30/2019   Desire for pregnancy 07/30/2019   Dislocation of knee, anterior, left, closed 01/21/2016   Closed anterior dislocation of left knee 01/21/2016   Depression 07/26/2014   Herpes labialis 07/03/2014   HIV disease 04/23/2014   General counseling for prescription of oral contraceptives 06/11/2013   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   Obesity 12/22/2012    REFERRING DIAG: M25.371 (ICD-10-CM) - Ankle  instability, right   THERAPY DIAG:  Pain in right ankle and joints of right foot  Muscle weakness (generalized)  Rationale for Evaluation and Treatment Rehabilitation  PERTINENT HISTORY: R peroneal tendon repair on 08/10/22  PRECAUTIONS: WB on 09/13/22 - DOS 08/10/2022  SUBJECTIVE:  SUBJECTIVE STATEMENT:  Pt presetns to PT with reprots of continued R ankle pain. Has been compliant with HEP with no adverse effect. Pt ready to begin PT at this time.    PAIN:  Are you having pain?  Yes: NPRS scale: 5/10 Pain location: R ankle Pain description: ache Aggravating factors: weight bearing Relieving factors: rest elevation   OBJECTIVE: (objective measures completed at initial evaluation unless otherwise dated)  DIAGNOSTIC FINDINGS: none available   PATIENT SURVEYS:  FOTO 09/29/22: 38%   POSTURE: rounded shoulders and increased thoracic kyphosis   PALPATION: TTP over lateral malleolus and scar   LOWER EXTREMITY ROM:   A/PROM Right eval Left eval  Hip flexion      Hip extension      Hip abduction      Hip adduction      Hip internal rotation      Hip external rotation      Knee flexion      Knee extension      Ankle dorsiflexion -10/0d    Ankle plantarflexion WFL    Ankle inversion 10/22d    Ankle eversion 0/8d     (Blank rows = not tested)   LOWER EXTREMITY MMT:   MMT Right eval Left eval  Hip flexion      Hip extension      Hip abduction      Hip adduction      Hip internal rotation      Hip external rotation      Knee flexion      Knee extension      Ankle dorsiflexion 2    Ankle plantarflexion 2+    Ankle inversion 2    Ankle eversion 2     (Blank rows = not tested)   LOWER EXTREMITY SPECIAL TESTS:  Deferred post-op   FUNCTIONAL TESTS:  Deferred post-op    GAIT: Distance walked: 7475ftx2 Assistive device utilized: Crutches Level of assistance: Modified independence Comments: step to pattern     TREATMENT: [facility] Adult PT Treatment:                                                DATE: 10/03/2022 Therapeutic Exercise: NuStep lvl 5 UE/LE x 3 min while taking subjective Amb 13950ft with lace up ASO and 1 axillary crutch Standing heel raise 2x10 Step up fwd 2x10 6in - R leading Long sitting calf stretch 2x30" Long sitting ankle 4-way 2x10 YTB Seated heel raise with ball btwn heels 2x10 Modalites: Game Ready  10 min R ankle 36 - low compression - supine  [facility] Adult PT Treatment:                                                DATE: 09/29/22 Therapeutic Exercise: Long sitting ankle pumps Rt x 20 R eversion towel slide x 5 Towel scruch 2x30" R Seated heel raise with ball btwn heels 2x10 Standing AP/lateral weight shift in ASO - cues for lifting heel/toes and bending Rt knee with AP shift Self Care: Desensitization techniques, lacing up ASO brace, wearing brace and weaning from boot/crutches as she feels safe to do so  [facility]  [facility]: 09/26/2022 Therapeutic Exercise: Long sitting ankle pumps x 20 R calf stretch 2x30"  R eversion towel slide x 5 Towel scruch 2x30" R Long sitting ankle 4-way 2x10 YTB Seated heel raise with ball btwn heels 2x10 Standing weight shift 2x10  Standing heel raise 2x10   PATIENT EDUCATION:  Education details: N/A Person educated: Patient Education method: Explanation Education comprehension: verbalized understanding and needs further education   HOME EXERCISE PROGRAM: Access Code: [email or web address] URL: [email or web address] Date: 09/18/2022 Prepared by: Gustavus Bryant   Exercises - Long Sitting Plantar Fascia Stretch with Towel  - 3 x daily - 5 x weekly - 1 sets - 2 reps - 30s hold - Towel Scrunches  - 3 x daily - 5 x weekly - 1 sets - 2  reps - 30s hold - Seated Great Toe Extension  - 3 x daily - 5 x weekly - 2 sets - 30 reps - Seated Lesser Toes Extension  - 3 x daily - 5 x weekly - 2 sets - 3 reps - Seated Ankle Alphabet  - 3 x daily - 5 x weekly - 1 sets - 26 reps - Seated Ankle Circles  - 3 x daily - 5 x weekly - 1 sets - 30 reps   ASSESSMENT:   CLINICAL IMPRESSION:  Pt was able to complete all prescribed exercises with progression to more out of boot activity. Pt ambulate with lace up ASO and one crutch today. Therapy focused on ankle strength and ROM post surgery. She continues to benefit from skilled PT services, will continue per POC as prescribed.    Eval: Patient is a 37 y.o. female who was seen today for physical therapy evaluation and treatment for Rankle pain, weakness and ROM deficits following surgical repair of peroneal tendons. She is WBAT in CAM boot using axillary crutches and step to gait.  Pain levels are high and patient is hypersensitive to scar region.  A/PROM and strength decreased in all planes.  Good PT and DP pulses noted, no redness, warmth or color changes observed.  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   OBJECTIVE IMPAIRMENTS: Abnormal gait, decreased activity tolerance, decreased coordination, decreased endurance, decreased knowledge of condition, decreased knowledge of use of DME, decreased mobility, difficulty walking, decreased ROM, decreased strength, increased edema, obesity, and pain.    ACTIVITY LIMITATIONS: carrying, lifting, standing, squatting, stairs, and locomotion level   PERSONAL FACTORS: Fitness, Past/current experiences, Time since onset of injury/illness/exacerbation, and 1 comorbidity: anxiety  are also affecting patient's functional outcome.    REHAB POTENTIAL: Good   CLINICAL DECISION MAKING: Evolving/moderate complexity   EVALUATION COMPLEXITY: Moderate     GOALS: Goals reviewed with patient? No   SHORT  TERM GOALS: Target date: 10/16/2022   Patient to demonstrate independence in HEP  Baseline:[id number] Goal status: INITIAL   2.  Obtain FOTO Baseline: TBD Goal status: MET 09/29/22: 38%   3.  Establish a step through gait pattern Baseline: step to using boot/crutches Goal status: INITIAL     LONG TERM GOALS: Target date: 11/13/2022   Assess FOTO progress Baseline: TBD Goal status: INITIAL   2.  Decrease pain to 6/10 Baseline: 10/10 Goal status: INITIAL   3.  Ambulate 210ft with LRAD using step through pattern. Baseline: 69ft with R boot and B axillary crutches Goal status: INITIAL   4.  Increase R ankle strength to 3+/5 Baseline:    R 09/18/22 L 09/18/22  Ankle  dorsiflexion 2    Ankle plantarflexion 2+    Ankle inversion 2    Ankle eversion 2      Goal status: INITIAL   5.  Increase R ankle AROM to 10d DF, 30d Inv and 20d everison Baseline:  Knee extension R 09/18/22 L 09/18/22  Ankle dorsiflexion -10/0d    Ankle plantarflexion WFL    Ankle inversion 10/22d    Ankle eversion 0/8d      Goal status: INITIAL       PLAN:   PT FREQUENCY: 2x/week   PT DURATION: 8 weeks   PLANNED INTERVENTIONS: Therapeutic exercises, Therapeutic activity, Neuromuscular re-education, Balance training, Gait training, Patient/Family education, Self Care, Joint mobilization, Stair training, DME instructions, Aquatic Therapy, Dry Needling, Manual therapy, and Re-evaluation   PLAN FOR NEXT SESSION: HEP review, FOTO assessment, MD f/u update, ROM, proprioception, desensitization tasks, OKC strengthening, gait training   Check all possible CPT codes: 80165 - PT Re-evaluation, 97110- Therapeutic Exercise, [phone]- Neuro Re-education, [phone] - Gait Training, [phone] - Manual Therapy, 97530 - Therapeutic Activities, and 97535 - Self Care    Check all conditions that are expected to impact treatment: None of these apply   If treatment provided at initial evaluation, no treatment charged due to  lack of authorization.        Eloy End, PT 10/03/2022, 3:14 PM

## 2022-10-05 ENCOUNTER — Ambulatory Visit: Payer: BC Managed Care – PPO

## 2022-10-05 DIAGNOSIS — R262 Difficulty in walking, not elsewhere classified: Secondary | ICD-10-CM | POA: Diagnosis not present

## 2022-10-05 DIAGNOSIS — M25571 Pain in right ankle and joints of right foot: Secondary | ICD-10-CM | POA: Diagnosis not present

## 2022-10-05 DIAGNOSIS — M6281 Muscle weakness (generalized): Secondary | ICD-10-CM

## 2022-10-05 DIAGNOSIS — M25671 Stiffness of right ankle, not elsewhere classified: Secondary | ICD-10-CM

## 2022-10-05 NOTE — Therapy (Signed)
OUTPATIENT PHYSICAL THERAPY TREATMENT NOTE   Patient Name: Brenda Tyler MRN: [id number] DOB:05/23/1986, 37 y.o., female Today's Date: 10/05/2022  PCP: Huel Cote, MD  REFERRING PROVIDER: Edwin Cap, DPM   END OF SESSION:   PT End of Session - 10/05/22 1302     Visit Number 5    Number of Visits 16    Date for PT Re-Evaluation 11/13/22    Authorization Type BCBS    PT Start Time 1300    PT Stop Time 1340    PT Time Calculation (min) 40 min    Activity Tolerance Patient tolerated treatment well;Patient limited by pain    Behavior During Therapy [facility] for tasks assessed/performed                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 - s/p Colpo 11/02/13 - CIN II -->    HIV (human immunodeficiency virus infection)    HSV (herpes simplex virus) infection    Obesity    Oropharyngeal candidiasis 07/05/2014   Other fatigue    Shortness of breath on exertion    Past Surgical History:  Procedure Laterality Date   INCISION AND DRAINAGE ABSCESS     on abdomen and buttocks   Patient Active Problem List   Diagnosis Date Noted   Pre-diabetes 11/02/2020   Vitamin D deficiency 11/02/2020   At risk for diabetes mellitus 11/02/2020   Acute intractable headache 02/23/2020   Dizziness and giddiness 02/23/2020   Cyst of bone of left hand 12/10/2019   Visit for routine gyn exam 07/30/2019   Desire for pregnancy 07/30/2019   Dislocation of knee, anterior, left, closed 01/21/2016   Closed anterior dislocation of left knee 01/21/2016   Depression 07/26/2014   Herpes labialis 07/03/2014   HIV disease 04/23/2014   General counseling for prescription of oral contraceptives 06/11/2013   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   Obesity 12/22/2012    REFERRING DIAG: M25.371 (ICD-10-CM) - Ankle  instability, right   THERAPY DIAG:  Pain in right ankle and joints of right foot  Muscle weakness (generalized)  Decreased range of motion of right ankle  Rationale for Evaluation and Treatment Rehabilitation  PERTINENT HISTORY: R peroneal tendon repair on 08/10/22  PRECAUTIONS: WB on 09/13/22 - DOS 08/10/2022  SUBJECTIVE:  SUBJECTIVE STATEMENT:  Has been cleared to transition to ASO brace starting 10/04/22.  Rates soreness at 8/10   PAIN:  Are you having pain?  Yes: NPRS scale: 5/10 Pain location: R ankle Pain description: ache Aggravating factors: weight bearing Relieving factors: rest elevation   OBJECTIVE: (objective measures completed at initial evaluation unless otherwise dated)  DIAGNOSTIC FINDINGS: none available   PATIENT SURVEYS:  FOTO 09/29/22: 38%(59% predicted)   POSTURE: rounded shoulders and increased thoracic kyphosis   PALPATION: TTP over lateral malleolus and scar   LOWER EXTREMITY ROM:   A/PROM Right eval Left eval  Hip flexion      Hip extension      Hip abduction      Hip adduction      Hip internal rotation      Hip external rotation      Knee flexion      Knee extension      Ankle dorsiflexion -10/0d    Ankle plantarflexion WFL    Ankle inversion 10/22d    Ankle eversion 0/8d     (Blank rows = not tested)   LOWER EXTREMITY MMT:   MMT Right eval Left eval  Hip flexion      Hip extension      Hip abduction      Hip adduction      Hip internal rotation      Hip external rotation      Knee flexion      Knee extension      Ankle dorsiflexion 2    Ankle plantarflexion 2+    Ankle inversion 2    Ankle eversion 2     (Blank rows = not tested)   LOWER EXTREMITY SPECIAL TESTS:  Deferred post-op   FUNCTIONAL TESTS:  Deferred post-op   GAIT: Distance  walked: 14ftx2 Assistive device utilized: Crutches Level of assistance: Modified independence Comments: step to pattern     TREATMENT: [facility] Adult PT Treatment:                                                DATE: 10/05/22 Therapeutic Exercise: NuStep lvl 4 UE/LE x 6 min Gait through clinic wearing brace w/o crutch Standing heel raise 2x10 Step up fwd 4 in 15x leading R Lateral step up 4 in 15x leading R DF stretch on 6 in block 15x Long sitting calf stretch 2x30" Long sitting ankle 3-way 15x YTB Seated heel raise R over 4 in block 10# dumbbell 15x2  Self Care: Review of donning Tr-Loc ankle brace to ensure proper fit   [facility] Adult PT Treatment:                                                DATE: 10/03/2022 Therapeutic Exercise: NuStep lvl 5 UE/LE x 3 min while taking subjective Amb 179ft with lace up ASO and 1 axillary crutch Standing heel raise 2x10 Step up fwd 2x10 6in - R leading Long sitting calf stretch 2x30" Long sitting ankle 4-way 2x10 YTB Seated heel raise with ball btwn heels 2x10 Modalites: Game Ready  10 min R ankle 36 - low compression - supine  [facility] Adult PT Treatment:  DATE: 09/29/22 Therapeutic Exercise: Long sitting ankle pumps Rt x 20 R eversion towel slide x 5 Towel scruch 2x30" R Seated heel raise with ball btwn heels 2x10 Standing AP/lateral weight shift in ASO - cues for lifting heel/toes and bending Rt knee with AP shift Self Care: Desensitization techniques, lacing up ASO brace, wearing brace and weaning from boot/crutches as she feels safe to do so  [facility] Adult PT Treatment:                                                DATE: 09/26/2022 Therapeutic Exercise: Long sitting ankle pumps x 20 R calf stretch 2x30"  R eversion towel slide x 5 Towel scruch 2x30" R Long sitting ankle 4-way 2x10 YTB Seated heel raise with ball btwn heels 2x10 Standing weight shift 2x10  Standing heel raise 2x10   PATIENT  EDUCATION:  Education details: N/A Person educated: Patient Education method: Explanation Education comprehension: verbalized understanding and needs further education   HOME EXERCISE PROGRAM: Access Code: [email or web address] URL: [email or web address] Date: 09/18/2022 Prepared by: Gustavus Bryant   Exercises - Long Sitting Plantar Fascia Stretch with Towel  - 3 x daily - 5 x weekly - 1 sets - 2 reps - 30s hold - Towel Scrunches  - 3 x daily - 5 x weekly - 1 sets - 2 reps - 30s hold - Seated Great Toe Extension  - 3 x daily - 5 x weekly - 2 sets - 30 reps - Seated Lesser Toes Extension  - 3 x daily - 5 x weekly - 2 sets - 3 reps - Seated Ankle Alphabet  - 3 x daily - 5 x weekly - 1 sets - 26 reps - Seated Ankle Circles  - 3 x daily - 5 x weekly - 1 sets - 30 reps   ASSESSMENT:   CLINICAL IMPRESSION: Has been able to transition to ASO and ambulate in clinic w/o crutch.  Emphasized weight bearing tasks and ROM in CKC positions.  All WB tasks performed in brace.  Incorporated soleus PREs to strengthen PF ability   Eval: Patient is a 37 y.o. female who was seen today for physical therapy evaluation and treatment for Rankle pain, weakness and ROM deficits following surgical repair of peroneal tendons. She is WBAT in CAM boot using axillary crutches and step to gait.  Pain levels are high and patient is hypersensitive to scar region.  A/PROM and strength decreased in all planes.  Good PT and DP pulses noted, no redness, warmth or color changes observed.  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   OBJECTIVE IMPAIRMENTS: Abnormal gait, decreased activity tolerance, decreased coordination, decreased endurance, decreased knowledge of condition, decreased knowledge of use of DME, decreased mobility, difficulty walking, decreased ROM, decreased strength, increased edema, obesity, and pain.    ACTIVITY LIMITATIONS: carrying, lifting,  standing, squatting, stairs, and locomotion level   PERSONAL FACTORS: Fitness, Past/current experiences, Time since onset of injury/illness/exacerbation, and 1 comorbidity: anxiety  are also affecting patient's functional outcome.    REHAB POTENTIAL: Good   CLINICAL DECISION MAKING: Evolving/moderate complexity   EVALUATION COMPLEXITY: Moderate     GOALS: Goals reviewed with patient? No   SHORT TERM GOALS: Target date: 10/16/2022   Patient to demonstrate independence in HEP  Baseline:[id number] Goal status: INITIAL  2.  Obtain FOTO Baseline: TBD Goal status: MET 09/29/22: 38%   3.  Establish a step through gait pattern Baseline: step to using boot/crutches Goal status: INITIAL     LONG TERM GOALS: Target date: 11/13/2022   Assess FOTO progress Baseline: 38% 59% predicted) Goal status: INITIAL   2.  Decrease pain to 6/10 Baseline: 10/10 Goal status: INITIAL   3.  Ambulate 274ft with LRAD using step through pattern. Baseline: 68ft with R boot and B axillary crutches Goal status: INITIAL   4.  Increase R ankle strength to 3+/5 Baseline:    R 09/18/22 L 09/18/22  Ankle dorsiflexion 2    Ankle plantarflexion 2+    Ankle inversion 2    Ankle eversion 2      Goal status: INITIAL   5.  Increase R ankle AROM to 10d DF, 30d Inv and 20d everison Baseline:  Knee extension R 09/18/22 L 09/18/22  Ankle dorsiflexion -10/0d    Ankle plantarflexion WFL    Ankle inversion 10/22d    Ankle eversion 0/8d      Goal status: INITIAL       PLAN:   PT FREQUENCY: 2x/week   PT DURATION: 8 weeks   PLANNED INTERVENTIONS: Therapeutic exercises, Therapeutic activity, Neuromuscular re-education, Balance training, Gait training, Patient/Family education, Self Care, Joint mobilization, Stair training, DME instructions, Aquatic Therapy, Dry Needling, Manual therapy, and Re-evaluation   PLAN FOR NEXT SESSION: HEP review, FOTO assessment, MD f/u update, ROM, proprioception,  desensitization tasks, OKC strengthening, gait training   Check all possible CPT codes: 16109 - PT Re-evaluation, 97110- Therapeutic Exercise, [phone]- Neuro Re-education, [phone] - Gait Training, [phone] - Manual Therapy, 97530 - Therapeutic Activities, and 97535 - Self Care    Check all conditions that are expected to impact treatment: None of these apply   If treatment provided at initial evaluation, no treatment charged due to lack of authorization.        Hildred Laser, PT 10/05/2022, 2:30 PM

## 2022-10-06 ENCOUNTER — Other Ambulatory Visit: Payer: Self-pay | Admitting: Podiatry

## 2022-10-10 ENCOUNTER — Ambulatory Visit: Payer: BC Managed Care – PPO

## 2022-10-10 DIAGNOSIS — M25671 Stiffness of right ankle, not elsewhere classified: Secondary | ICD-10-CM

## 2022-10-10 DIAGNOSIS — M6281 Muscle weakness (generalized): Secondary | ICD-10-CM

## 2022-10-10 DIAGNOSIS — M25571 Pain in right ankle and joints of right foot: Secondary | ICD-10-CM | POA: Diagnosis not present

## 2022-10-10 DIAGNOSIS — R262 Difficulty in walking, not elsewhere classified: Secondary | ICD-10-CM

## 2022-10-10 NOTE — Therapy (Signed)
OUTPATIENT PHYSICAL THERAPY TREATMENT NOTE   Patient Name: Brenda Tyler MRN: [id number] DOB:1986-03-27, 37 y.o., female Today's Date: 10/12/2022  PCP: Huel Cote, MD  REFERRING PROVIDER: Edwin Cap, DPM   END OF SESSION:   PT End of Session - 10/12/22 1303     Visit Number 7    Number of Visits 16    Date for PT Re-Evaluation 11/13/22    Authorization Type BCBS    Authorization Time Period [employer] Secondary 10 visits 10/03/22-01/01/23    Authorization - Number of Visits 10    PT Start Time 1300    PT Stop Time 1340    PT Time Calculation (min) 40 min    Activity Tolerance Patient tolerated treatment well;Patient limited by pain    Behavior During Therapy [facility] for tasks assessed/performed                  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 - s/p Colpo 11/02/13 - CIN II -->    HIV (human immunodeficiency virus infection)    HSV (herpes simplex virus) infection    Obesity    Oropharyngeal candidiasis 07/05/2014   Other fatigue    Shortness of breath on exertion    Past Surgical History:  Procedure Laterality Date   INCISION AND DRAINAGE ABSCESS     on abdomen and buttocks   Patient Active Problem List   Diagnosis Date Noted   Pre-diabetes 11/02/2020   Vitamin D deficiency 11/02/2020   At risk for diabetes mellitus 11/02/2020   Acute intractable headache 02/23/2020   Dizziness and giddiness 02/23/2020   Cyst of bone of left hand 12/10/2019   Visit for routine gyn exam 07/30/2019   Desire for pregnancy 07/30/2019   Dislocation of knee, anterior, left, closed 01/21/2016   Closed anterior dislocation of left knee 01/21/2016   Depression 07/26/2014   Herpes labialis 07/03/2014   HIV disease 04/23/2014   General counseling for prescription of oral  contraceptives 06/11/2013   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  Reports continued swelling when in dependent position, has tried compression hose but has limited tolerance.  Feels she needs a more supportive shoe   PAIN:  Are you having pain?  Yes: NPRS scale: 8/10 Pain location: R ankle Pain description: ache Aggravating factors: weight bearing Relieving factors: rest elevation   OBJECTIVE: (objective measures completed at initial evaluation unless otherwise dated)  DIAGNOSTIC FINDINGS: none available   PATIENT SURVEYS:  FOTO 09/29/22: 38%(59% predicted)   POSTURE: rounded shoulders and increased thoracic kyphosis   PALPATION: TTP over lateral malleolus and scar   LOWER EXTREMITY ROM:   A/PROM Right eval Left eval  Hip flexion      Hip extension      Hip abduction      Hip adduction      Hip internal rotation      Hip external rotation      Knee flexion      Knee extension      Ankle dorsiflexion -10/0d    Ankle plantarflexion WFL    Ankle inversion 10/22d    Ankle eversion 0/8d     (Blank rows = not tested)   LOWER EXTREMITY MMT:   MMT Right eval Left eval  Hip flexion      Hip extension      Hip abduction      Hip adduction      Hip internal rotation      Hip external rotation      Knee flexion      Knee extension      Ankle  dorsiflexion 2    Ankle plantarflexion 2+    Ankle inversion 2    Ankle eversion 2     (Blank rows = not tested)   LOWER EXTREMITY SPECIAL TESTS:  Deferred post-op   FUNCTIONAL TESTS:  Deferred post-op   GAIT: Distance walked: 40ftx2 Assistive device utilized: Crutches Level of assistance: Modified independence Comments: step to pattern     TREATMENT:[facility] Adult PT Treatment:                                                DATE: 10/12/22 Therapeutic Exercise: NuStep lvl 2 8 min PF against wall 15x DF against wall 15x PF stretch against wall 30s x2 Runners step 4 in block R 10x Lateral step up 4 in 10x leading R 3 way ankle YTB 15x ea,   [facility] Adult PT Treatment:                                                DATE: 10/10/22 Therapeutic Exercise: NuStep lvl 5 UE/LE x 6 min Gait through clinic wearing brace w/o crutch Standing heel raise 2x10 Step up fwd 4 in 15x leading R Lateral step up 4 in 15x leading R Long sitting calf stretch 2x30" Seated heel raise R over 4 in block 10# dumbbell 15x2  [facility] Adult PT Treatment:  DATE: 10/05/22 Therapeutic Exercise: NuStep lvl 4 UE/LE x 6 min Gait through clinic wearing brace w/o crutch Standing heel raise 2x10 Step up fwd 4 in 15x leading R Lateral step up 4 in 15x leading R DF stretch on 6 in block 15x Long sitting calf stretch 2x30" Long sitting ankle 3-way 15x YTB Seated heel raise R over 4 in block 10# dumbbell 15x2  Self Care: Review of donning Tr-Loc ankle brace to ensure proper fit   [facility] Adult PT Treatment:                                                DATE: 10/03/2022 Therapeutic Exercise: NuStep lvl 5 UE/LE x 3 min while taking subjective Amb 152ft with lace up ASO and 1 axillary crutch Standing heel raise 2x10 Step up fwd 2x10 6in - R leading Long sitting calf stretch 2x30" Long sitting ankle 4-way 2x10 YTB Seated heel raise with ball btwn heels 2x10 Modalites: Game  Ready  10 min R ankle 36 - low compression - supine   PATIENT EDUCATION:  Education details: N/A Person educated: Patient Education method: Explanation Education comprehension: verbalized understanding and needs further education   HOME EXERCISE PROGRAM: Access Code: [email or web address] URL: [email or web address] Date: 09/18/2022 Prepared by: Gustavus Bryant   Exercises - Long Sitting Plantar Fascia Stretch with Towel  - 3 x daily - 5 x weekly - 1 sets - 2 reps - 30s hold - Towel Scrunches  - 3 x daily - 5 x weekly - 1 sets - 2 reps - 30s hold - Seated Great Toe Extension  - 3 x daily - 5 x weekly - 2 sets - 30 reps - Seated Lesser Toes Extension  - 3 x daily - 5 x weekly - 2 sets - 3 reps - Seated Ankle Alphabet  - 3 x daily - 5 x weekly - 1 sets - 26 reps - Seated Ankle Circles  - 3 x daily - 5 x weekly - 1 sets - 30 reps   ASSESSMENT:   CLINICAL IMPRESSION: Session performed out of brace.  Increased time on aerobic tasks, advanced to runners step to challenge balance, emphasize strength.  Discussed need to update footwear and bring compression sock in for PT review.   Eval: Patient is a 38 y.o. female who was seen today for physical therapy evaluation and treatment for Rankle pain, weakness and ROM deficits following surgical repair of peroneal tendons. She is WBAT in CAM boot using axillary crutches and step to gait.  Pain levels are high and patient is hypersensitive to scar region.  A/PROM and strength decreased in all planes.  Good PT and DP pulses noted, no redness, warmth or color changes observed.  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   OBJECTIVE IMPAIRMENTS: Abnormal gait, decreased activity tolerance, decreased coordination, decreased endurance, decreased knowledge of condition, decreased knowledge of use of DME, decreased mobility, difficulty walking, decreased ROM, decreased strength, increased edema,  obesity, and pain.    ACTIVITY LIMITATIONS: carrying, lifting, standing, squatting, stairs, and locomotion level   PERSONAL FACTORS: Fitness, Past/current experiences, Time since onset of injury/illness/exacerbation, and 1 comorbidity: anxiety  are also affecting patient's functional outcome.    REHAB POTENTIAL: Good   CLINICAL DECISION MAKING: Evolving/moderate complexity   EVALUATION COMPLEXITY: Moderate  GOALS: Goals reviewed with patient? No   SHORT TERM GOALS: Target date: 10/16/2022   Patient to demonstrate independence in HEP  Baseline:[id number] Goal status: INITIAL   2.  Obtain FOTO Baseline: TBD Goal status: MET 09/29/22: 38%   3.  Establish a step through gait pattern Baseline: step to using boot/crutches Goal status: INITIAL     LONG TERM GOALS: Target date: 11/13/2022   Assess FOTO progress Baseline: 38% 59% predicted) Goal status: INITIAL   2.  Decrease pain to 6/10 Baseline: 10/10 Goal status: INITIAL   3.  Ambulate 267ft with LRAD using step through pattern. Baseline: 44ft with R boot and B axillary crutches Goal status: INITIAL   4.  Increase R ankle strength to 3+/5 Baseline:    R 09/18/22 L 09/18/22  Ankle dorsiflexion 2    Ankle plantarflexion 2+    Ankle inversion 2    Ankle eversion 2      Goal status: INITIAL   5.  Increase R ankle AROM to 10d DF, 30d Inv and 20d everison Baseline:  Knee extension R 09/18/22 L 09/18/22  Ankle dorsiflexion -10/0d    Ankle plantarflexion WFL    Ankle inversion 10/22d    Ankle eversion 0/8d      Goal status: INITIAL       PLAN:   PT FREQUENCY: 2x/week   PT DURATION: 8 weeks   PLANNED INTERVENTIONS: Therapeutic exercises, Therapeutic activity, Neuromuscular re-education, Balance training, Gait training, Patient/Family education, Self Care, Joint mobilization, Stair training, DME instructions, Aquatic Therapy, Dry Needling, Manual therapy, and Re-evaluation   PLAN FOR NEXT SESSION: HEP review,  FOTO assessment, MD f/u update, ROM, proprioception, desensitization tasks, OKC strengthening, gait training    Hildred Laser, PT 10/12/2022, 1:44 PM

## 2022-10-10 NOTE — Therapy (Signed)
OUTPATIENT PHYSICAL THERAPY TREATMENT NOTE   Patient Name: Brenda Tyler MRN: [id number] DOB:07-23-1985, 37 y.o., female Today's Date: 10/10/2022  PCP: Huel Cote, MD  REFERRING PROVIDER: Edwin Cap, DPM   END OF SESSION:   PT End of Session - 10/10/22 1135     Visit Number 6    Number of Visits 16    Date for PT Re-Evaluation 11/13/22    Authorization Type BCBS    PT Start Time 1135    PT Stop Time 1215    PT Time Calculation (min) 40 min    Activity Tolerance Patient tolerated treatment well;Patient limited by pain    Behavior During Therapy [facility] for tasks assessed/performed                 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 - s/p Colpo 11/02/13 - CIN II -->    HIV (human immunodeficiency virus infection)    HSV (herpes simplex virus) infection    Obesity    Oropharyngeal candidiasis 07/05/2014   Other fatigue    Shortness of breath on exertion    Past Surgical History:  Procedure Laterality Date   INCISION AND DRAINAGE ABSCESS     on abdomen and buttocks   Patient Active Problem List   Diagnosis Date Noted   Pre-diabetes 11/02/2020   Vitamin D deficiency 11/02/2020   At risk for diabetes mellitus 11/02/2020   Acute intractable headache 02/23/2020   Dizziness and giddiness 02/23/2020   Cyst of bone of left hand 12/10/2019   Visit for routine gyn exam 07/30/2019   Desire for pregnancy 07/30/2019   Dislocation of knee, anterior, left, closed 01/21/2016   Closed anterior dislocation of left knee 01/21/2016   Depression 07/26/2014   Herpes labialis 07/03/2014   HIV disease 04/23/2014   General counseling for prescription of oral contraceptives 06/11/2013   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   Obesity 12/22/2012    REFERRING DIAG: M25.371 (ICD-10-CM) -  Ankle instability, right   THERAPY DIAG:  Pain in right ankle and joints of right foot  Muscle weakness (generalized)  Decreased range of motion of right ankle  Stiffness of right ankle, not elsewhere classified  Difficulty in walking, not elsewhere classified  Rationale for Evaluation and Treatment Rehabilitation  PERTINENT HISTORY: R peroneal tendon repair on 08/10/22  PRECAUTIONS: WB on 09/13/22 - DOS 08/10/2022  SUBJECTIVE:  SUBJECTIVE STATEMENT:  Patient presents in CAM boot, states pain is worse when not in boot. She states when she wears the ASO brace, her family and friends thinks she is "completely better" and she has been cooking, cleaning, and going out this week.    PAIN:  Are you having pain?  Yes: NPRS scale: 8/10 Pain location: R ankle Pain description: ache Aggravating factors: weight bearing Relieving factors: rest elevation   OBJECTIVE: (objective measures completed at initial evaluation unless otherwise dated)  DIAGNOSTIC FINDINGS: none available   PATIENT SURVEYS:  FOTO 09/29/22: 38%(59% predicted)   POSTURE: rounded shoulders and increased thoracic kyphosis   PALPATION: TTP over lateral malleolus and scar   LOWER EXTREMITY ROM:   A/PROM Right eval Left eval  Hip flexion      Hip extension      Hip abduction      Hip adduction      Hip internal rotation      Hip external rotation      Knee flexion      Knee extension      Ankle dorsiflexion -10/0d    Ankle plantarflexion WFL    Ankle inversion 10/22d    Ankle eversion 0/8d     (Blank rows = not tested)   LOWER EXTREMITY MMT:   MMT Right eval Left eval  Hip flexion      Hip extension      Hip abduction      Hip adduction      Hip internal rotation      Hip external rotation      Knee flexion      Knee  extension      Ankle dorsiflexion 2    Ankle plantarflexion 2+    Ankle inversion 2    Ankle eversion 2     (Blank rows = not tested)   LOWER EXTREMITY SPECIAL TESTS:  Deferred post-op   FUNCTIONAL TESTS:  Deferred post-op   GAIT: Distance walked: 67ftx2 Assistive device utilized: Crutches Level of assistance: Modified independence Comments: step to pattern     TREATMENT: [facility] Adult PT Treatment:                                                DATE: 10/10/22 Therapeutic Exercise: NuStep lvl 5 UE/LE x 6 min Gait through clinic wearing brace w/o crutch Standing heel raise 2x10 Step up fwd 4 in 15x leading R Lateral step up 4 in 15x leading R Long sitting calf stretch 2x30" Seated heel raise R over 4 in block 10# dumbbell 15x2  [facility] Adult PT Treatment:                                                DATE: 10/05/22 Therapeutic Exercise: NuStep lvl 4 UE/LE x 6 min Gait through clinic wearing brace w/o crutch Standing heel raise 2x10 Step up fwd 4 in 15x leading R Lateral step up 4 in 15x leading R DF stretch on 6 in block 15x Long sitting calf stretch 2x30" Long sitting ankle 3-way 15x YTB Seated heel raise R over 4 in block 10# dumbbell 15x2  Self Care: Review of donning Tr-Loc ankle brace to ensure proper fit   [facility]  Adult PT Treatment:                                                DATE: 10/03/2022 Therapeutic Exercise: NuStep lvl 5 UE/LE x 3 min while taking subjective Amb 120ft with lace up ASO and 1 axillary crutch Standing heel raise 2x10 Step up fwd 2x10 6in - R leading Long sitting calf stretch 2x30" Long sitting ankle 4-way 2x10 YTB Seated heel raise with ball btwn heels 2x10 Modalites: Game Ready  10 min R ankle 36 - low compression - supine   PATIENT EDUCATION:  Education details: N/A Person educated: Patient Education method: Explanation Education comprehension: verbalized understanding and needs further education   HOME EXERCISE PROGRAM: Access  Code: [email or web address] URL: [email or web address] Date: 09/18/2022 Prepared by: Gustavus Bryant   Exercises - Long Sitting Plantar Fascia Stretch with Towel  - 3 x daily - 5 x weekly - 1 sets - 2 reps - 30s hold - Towel Scrunches  - 3 x daily - 5 x weekly - 1 sets - 2 reps - 30s hold - Seated Great Toe Extension  - 3 x daily - 5 x weekly - 2 sets - 30 reps - Seated Lesser Toes Extension  - 3 x daily - 5 x weekly - 2 sets - 3 reps - Seated Ankle Alphabet  - 3 x daily - 5 x weekly - 1 sets - 26 reps - Seated Ankle Circles  - 3 x daily - 5 x weekly - 1 sets - 30 reps   ASSESSMENT:   CLINICAL IMPRESSION:  Patient presents to PT reporting increased pain today, stating that she has been very busy this week with cooking, cleaning and going out with friends as she feels like not wearing the boot has given others permission to not longer help her out. Session today continued to focus on strengthening in brace and shoe. Patient continues to benefit from skilled PT services and should be progressed as able to improve functional independence.    Eval: Patient is a 37 y.o. female who was seen today for physical therapy evaluation and treatment for Rankle pain, weakness and ROM deficits following surgical repair of peroneal tendons. She is WBAT in CAM boot using axillary crutches and step to gait.  Pain levels are high and patient is hypersensitive to scar region.  A/PROM and strength decreased in all planes.  Good PT and DP pulses noted, no redness, warmth or color changes observed.  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   OBJECTIVE IMPAIRMENTS: Abnormal gait, decreased activity tolerance, decreased coordination, decreased endurance, decreased knowledge of condition, decreased knowledge of use of DME, decreased mobility, difficulty walking, decreased ROM, decreased strength, increased edema, obesity, and pain.    ACTIVITY LIMITATIONS:  carrying, lifting, standing, squatting, stairs, and locomotion level   PERSONAL FACTORS: Fitness, Past/current experiences, Time since onset of injury/illness/exacerbation, and 1 comorbidity: anxiety  are also affecting patient's functional outcome.    REHAB POTENTIAL: Good   CLINICAL DECISION MAKING: Evolving/moderate complexity   EVALUATION COMPLEXITY: Moderate     GOALS: Goals reviewed with patient? No   SHORT TERM GOALS: Target date: 10/16/2022   Patient to demonstrate independence in HEP  Baseline:[id number] Goal status: INITIAL   2.  Obtain FOTO Baseline: TBD Goal status: MET 09/29/22: 38%  3.  Establish a step through gait pattern Baseline: step to using boot/crutches Goal status: INITIAL     LONG TERM GOALS: Target date: 11/13/2022   Assess FOTO progress Baseline: 38% 59% predicted) Goal status: INITIAL   2.  Decrease pain to 6/10 Baseline: 10/10 Goal status: INITIAL   3.  Ambulate 245ft with LRAD using step through pattern. Baseline: 30ft with R boot and B axillary crutches Goal status: INITIAL   4.  Increase R ankle strength to 3+/5 Baseline:    R 09/18/22 L 09/18/22  Ankle dorsiflexion 2    Ankle plantarflexion 2+    Ankle inversion 2    Ankle eversion 2      Goal status: INITIAL   5.  Increase R ankle AROM to 10d DF, 30d Inv and 20d everison Baseline:  Knee extension R 09/18/22 L 09/18/22  Ankle dorsiflexion -10/0d    Ankle plantarflexion WFL    Ankle inversion 10/22d    Ankle eversion 0/8d      Goal status: INITIAL       PLAN:   PT FREQUENCY: 2x/week   PT DURATION: 8 weeks   PLANNED INTERVENTIONS: Therapeutic exercises, Therapeutic activity, Neuromuscular re-education, Balance training, Gait training, Patient/Family education, Self Care, Joint mobilization, Stair training, DME instructions, Aquatic Therapy, Dry Needling, Manual therapy, and Re-evaluation   PLAN FOR NEXT SESSION: HEP review, FOTO assessment, MD f/u update, ROM,  proprioception, desensitization tasks, OKC strengthening, gait training    Berta Minor, PTA 10/10/2022, 11:36 AM

## 2022-10-11 ENCOUNTER — Other Ambulatory Visit: Payer: Self-pay | Admitting: Podiatry

## 2022-10-12 ENCOUNTER — Ambulatory Visit: Payer: BC Managed Care – PPO

## 2022-10-12 DIAGNOSIS — M25571 Pain in right ankle and joints of right foot: Secondary | ICD-10-CM | POA: Diagnosis not present

## 2022-10-12 DIAGNOSIS — M25671 Stiffness of right ankle, not elsewhere classified: Secondary | ICD-10-CM

## 2022-10-12 DIAGNOSIS — M6281 Muscle weakness (generalized): Secondary | ICD-10-CM

## 2022-10-12 DIAGNOSIS — R262 Difficulty in walking, not elsewhere classified: Secondary | ICD-10-CM | POA: Diagnosis not present

## 2022-10-12 MED ORDER — TRAMADOL HCL 50 MG PO TABS: 50.0000 mg | ORAL_TABLET | Freq: Four times a day (QID) | ORAL | 0 refills | Status: AC | PRN

## 2022-10-12 NOTE — Telephone Encounter (Signed)
Patient is requesting a refill of the Hydrocodone-ace,5-325 mg , is still having pain with walking and PT pain, please advise/send to pharmacy on file.

## 2022-10-17 ENCOUNTER — Ambulatory Visit: Payer: BC Managed Care – PPO

## 2022-10-18 NOTE — Therapy (Signed)
OUTPATIENT PHYSICAL THERAPY TREATMENT NOTE   Patient Name: Brenda Tyler MRN: [id number] DOB:March 09, 1986, 37 y.o., female Today's Date: 10/19/2022  PCP: Huel Cote, MD  REFERRING PROVIDER: Edwin Cap, DPM   END OF SESSION:   PT End of Session - 10/19/22 1220     Visit Number 8    Number of Visits 16    Date for PT Re-Evaluation 11/13/22    Authorization Type BCBS    Authorization Time Period [employer] Secondary 10 visits 10/03/22-01/01/23    Authorization - Number of Visits 10    PT Start Time 1215    PT Stop Time 1255    PT Time Calculation (min) 40 min    Activity Tolerance Patient tolerated treatment well;Patient limited by pain    Behavior During Therapy [facility] for tasks assessed/performed                   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 - s/p Colpo 11/02/13 - CIN II -->    HIV (human immunodeficiency virus infection) (HCC)    HSV (herpes simplex virus) infection    Obesity    Oropharyngeal candidiasis 07/05/2014   Other fatigue    Shortness of breath on exertion    Past Surgical History:  Procedure Laterality Date   INCISION AND DRAINAGE ABSCESS     on abdomen and buttocks   Patient Active Problem List   Diagnosis Date Noted   Pre-diabetes 11/02/2020   Vitamin D deficiency 11/02/2020   At risk for diabetes mellitus 11/02/2020   Acute intractable headache 02/23/2020   Dizziness and giddiness 02/23/2020   Cyst of bone of left hand 12/10/2019   Visit for routine gyn exam 07/30/2019   Desire for pregnancy 07/30/2019   Dislocation of knee, anterior, left, closed 01/21/2016   Closed anterior dislocation of left knee 01/21/2016   Depression 07/26/2014   Herpes labialis 07/03/2014   HIV disease (HCC) 04/23/2014   General counseling for prescription of  oral contraceptives 06/11/2013   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   Obesity 12/22/2012    REFERRING DIAG: M25.371 (ICD-10-CM) - Ankle instability, right   THERAPY DIAG:  Pain in right ankle and joints of right foot  Muscle weakness (generalized)  Decreased range of motion of right ankle  Rationale for Evaluation and Treatment Rehabilitation  PERTINENT HISTORY: R peroneal tendon repair on 08/10/22  PRECAUTIONS: WB on 09/13/22 - DOS 08/10/2022  SUBJECTIVE:  SUBJECTIVE STATEMENT:  Overall discomfort improving but has concerns about returning to work due to continued pain and discomfort.  Somewhat frustrated that ankle is still sore at this point.    PAIN:  Are you having pain?  Yes: NPRS scale: 8/10 Pain location: R ankle Pain description: ache Aggravating factors: weight bearing Relieving factors: rest elevation   OBJECTIVE: (objective measures completed at initial evaluation unless otherwise dated)  DIAGNOSTIC FINDINGS: none available   PATIENT SURVEYS:  FOTO 09/29/22: 38%(59% predicted)   POSTURE: rounded shoulders and increased thoracic kyphosis   PALPATION: TTP over lateral malleolus and scar   LOWER EXTREMITY ROM:   A/PROM Right eval Left eval R 10/19/22  Hip flexion       Hip extension       Hip abduction       Hip adduction       Hip internal rotation       Hip external rotation       Knee flexion       Knee extension       Ankle dorsiflexion -10/0d   0/8d  Ankle plantarflexion WFL     Ankle inversion 10/22d   25/35d  Ankle eversion 0/8d   20/25d   (Blank rows = not tested)   LOWER EXTREMITY MMT:   MMT Right eval Left eval R 10/19/22  Hip flexion       Hip extension       Hip abduction       Hip adduction       Hip internal rotation       Hip external rotation        Knee flexion       Knee extension       Ankle dorsiflexion 2   3-  Ankle plantarflexion 2+   3-  Ankle inversion 2   3-  Ankle eversion 2   3-   (Blank rows = not tested)   LOWER EXTREMITY SPECIAL TESTS:  Deferred post-op   FUNCTIONAL TESTS:  Deferred post-op   GAIT: Distance walked: 55ftx2 Assistive device utilized: Crutches Level of assistance: Modified independence Comments: step to pattern   [facility] Adult PT Treatment:                                                DATE: 10/19/22 Therapeutic Exercise: NuStep lvl 4 8 min Gastroc stretch 30s x2 Re-assessment   TREATMENT:[facility] Adult PT Treatment:                                                DATE: 10/12/22 Therapeutic Exercise: NuStep lvl 2 8 min PF against wall 15x DF against wall 15x PF stretch against wall 30s x2 Runners step 4 in block R 10x Lateral step up 4 in 10x leading R 3 way ankle YTB 15x ea,   [facility] Adult PT Treatment:                                                DATE: 10/10/22 Therapeutic Exercise: NuStep lvl 5 UE/LE x 6 min Gait through clinic  wearing brace w/o crutch Standing heel raise 2x10 Step up fwd 4 in 15x leading R Lateral step up 4 in 15x leading R Long sitting calf stretch 2x30" Seated heel raise R over 4 in block 10# dumbbell 15x2    PATIENT EDUCATION:  Education details: N/A Person educated: Patient Education method: Explanation Education comprehension: verbalized understanding and needs further education   HOME EXERCISE PROGRAM: Access Code: [email or web address] URL: [email or web address] Date: 09/18/2022 Prepared by: Gustavus Bryant   Exercises - Long Sitting Plantar Fascia Stretch with Towel  - 3 x daily - 5 x weekly - 1 sets - 2 reps - 30s hold - Towel Scrunches  - 3 x daily - 5 x weekly - 1 sets - 2 reps - 30s hold - Seated Great Toe Extension  - 3 x daily - 5 x weekly - 2 sets - 30 reps - Seated Lesser Toes Extension  - 3 x daily - 5 x weekly - 2 sets - 3 reps - Seated  Ankle Alphabet  - 3 x daily - 5 x weekly - 1 sets - 26 reps - Seated Ankle Circles  - 3 x daily - 5 x weekly - 1 sets - 30 reps   ASSESSMENT:   CLINICAL IMPRESSION: Todays session re-assessed progress towards goals, ROM, strength and function.  Added resistance to aerobic tasks.  Progress made regarding ROM and strength   Eval: Patient is a 37 y.o. female who was seen today for physical therapy evaluation and treatment for Rankle pain, weakness and ROM deficits following surgical repair of peroneal tendons. She is WBAT in CAM boot using axillary crutches and step to gait.  Pain levels are high and patient is hypersensitive to scar region.  A/PROM and strength decreased in all planes.  Good PT and DP pulses noted, no redness, warmth or color changes observed.  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   OBJECTIVE IMPAIRMENTS: Abnormal gait, decreased activity tolerance, decreased coordination, decreased endurance, decreased knowledge of condition, decreased knowledge of use of DME, decreased mobility, difficulty walking, decreased ROM, decreased strength, increased edema, obesity, and pain.    ACTIVITY LIMITATIONS: carrying, lifting, standing, squatting, stairs, and locomotion level   PERSONAL FACTORS: Fitness, Past/current experiences, Time since onset of injury/illness/exacerbation, and 1 comorbidity: anxiety  are also affecting patient's functional outcome.    REHAB POTENTIAL: Good   CLINICAL DECISION MAKING: Evolving/moderate complexity   EVALUATION COMPLEXITY: Moderate     GOALS: Goals reviewed with patient? No   SHORT TERM GOALS: Target date: 10/16/2022   Patient to demonstrate independence in HEP  Baseline:[email or web address] Goal status: Met   2.  Obtain FOTO Baseline: TBD Goal status: Ongoing 09/29/22: 38%   3.  Establish a step through gait pattern Baseline: step to using boot/crutches; 10/19/22 Step through pattern w/o need  of ADs just ASO brace Goal status: INITIAL     LONG TERM GOALS: Target date: 11/13/2022   Assess FOTO progress Baseline: 38% 59% predicted) Goal status: INITIAL   2.  Decrease pain to 6/10 Baseline: 10/10 Goal status: INITIAL   3.  Ambulate 251ft with LRAD using step through pattern. Baseline: 64ft with R boot and B axillary crutches Goal status: INITIAL   4.  Increase R ankle strength to 3+/5 Baseline:  MMT Right eval Left eval R 10/19/22  Hip flexion       Hip extension       Hip abduction  Hip adduction       Hip internal rotation       Hip external rotation       Knee flexion       Knee extension       Ankle dorsiflexion 2   3-  Ankle plantarflexion 2+   3-  Ankle inversion 2   3-  Ankle eversion 2   3-    Goal status: INITIAL   5.  Increase R ankle AROM to 10d DF, 30d Inv and 20d everison Baseline:  A/PROM Right eval Left eval R 10/19/22  Hip flexion       Hip extension       Hip abduction       Hip adduction       Hip internal rotation       Hip external rotation       Knee flexion       Knee extension       Ankle dorsiflexion -10/0d   0/8d  Ankle plantarflexion WFL     Ankle inversion 10/22d   25/35d  Ankle eversion 0/8d   20/25d    Goal status: INITIAL       PLAN:   PT FREQUENCY: 2x/week   PT DURATION: 8 weeks   PLANNED INTERVENTIONS: Therapeutic exercises, Therapeutic activity, Neuromuscular re-education, Balance training, Gait training, Patient/Family education, Self Care, Joint mobilization, Stair training, DME instructions, Aquatic Therapy, Dry Needling, Manual therapy, and Re-evaluation   PLAN FOR NEXT SESSION: HEP review, FOTO assessment, MD f/u update, ROM, proprioception, desensitization tasks, OKC strengthening, gait training    Hildred Laser, PT 10/19/2022, 12:21 PM

## 2022-10-19 ENCOUNTER — Ambulatory Visit: Payer: BC Managed Care – PPO

## 2022-10-19 DIAGNOSIS — M25671 Stiffness of right ankle, not elsewhere classified: Secondary | ICD-10-CM | POA: Diagnosis not present

## 2022-10-19 DIAGNOSIS — M6281 Muscle weakness (generalized): Secondary | ICD-10-CM | POA: Diagnosis not present

## 2022-10-19 DIAGNOSIS — M25571 Pain in right ankle and joints of right foot: Secondary | ICD-10-CM | POA: Diagnosis not present

## 2022-10-19 DIAGNOSIS — R262 Difficulty in walking, not elsewhere classified: Secondary | ICD-10-CM | POA: Diagnosis not present

## 2022-10-20 ENCOUNTER — Other Ambulatory Visit: Payer: Self-pay | Admitting: Podiatry

## 2022-10-23 NOTE — Therapy (Unsigned)
OUTPATIENT PHYSICAL THERAPY TREATMENT NOTE   Patient Name: Brenda Tyler MRN: [id number] DOB:[birth year], 37 y.o., female Today's Date: 10/24/2022  PCP: Huel Cote, MD  REFERRING PROVIDER: Edwin Cap, DPM   END OF SESSION:   PT End of Session - 10/24/22 1216     Visit Number 9    Number of Visits 16    Date for PT Re-Evaluation 11/13/22    Authorization Type [employer]    Authorization Time Period [employer] Secondary 10 visits 10/03/22-01/01/23    Authorization - Number of Visits 10    PT Start Time 1215    Activity Tolerance Patient tolerated treatment well;Patient limited by pain    Behavior During Therapy [facility] for tasks assessed/performed                   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 - s/p Colpo 11/02/13 - CIN II -->    HIV (human immunodeficiency virus infection) (HCC)    HSV (herpes simplex virus) infection    Obesity    Oropharyngeal candidiasis 07/05/2014   Other fatigue    Shortness of breath on exertion    Past Surgical History:  Procedure Laterality Date   INCISION AND DRAINAGE ABSCESS     on abdomen and buttocks   Patient Active Problem List   Diagnosis Date Noted   Pre-diabetes 11/02/2020   Vitamin D deficiency 11/02/2020   At risk for diabetes mellitus 11/02/2020   Acute intractable headache 02/23/2020   Dizziness and giddiness 02/23/2020   Cyst of bone of left hand 12/10/2019   Visit for routine gyn exam 07/30/2019   Desire for pregnancy 07/30/2019   Dislocation of knee, anterior, left, closed 01/21/2016   Closed anterior dislocation of left knee 01/21/2016   Depression 07/26/2014   Herpes labialis 07/03/2014   HIV disease (HCC) 04/23/2014   General counseling for prescription of oral contraceptives 06/11/2013   Secondary Amenorrhea  12/22/2012   Obesity 12/22/2012    REFERRING DIAG: M25.371 (ICD-10-CM) - Ankle instability, right   THERAPY DIAG:  Pain in right ankle and joints of right foot  Muscle weakness (generalized)  Decreased range of motion of right ankle  Rationale for Evaluation and Treatment Rehabilitation  PERTINENT HISTORY: R peroneal tendon repair on 08/10/22  PRECAUTIONS: WB on 09/13/22 - DOS 08/10/2022  SUBJECTIVE:  SUBJECTIVE STATEMENT:  No change in pain/discomfort levels, has been wearing ASO brace   PAIN:  Are you having pain?  Yes: NPRS scale: 8/10 Pain location: R ankle Pain description: ache Aggravating factors: weight bearing Relieving factors: rest elevation   OBJECTIVE: (objective measures completed at initial evaluation unless otherwise dated)  DIAGNOSTIC FINDINGS: none available   PATIENT SURVEYS:  FOTO 09/29/22: 38%(59% predicted)   POSTURE: rounded shoulders and increased thoracic kyphosis   PALPATION: TTP over lateral malleolus and scar   LOWER EXTREMITY ROM:   A/PROM Right eval Left eval R 10/19/22  Hip flexion       Hip extension       Hip abduction       Hip adduction       Hip internal rotation       Hip external rotation       Knee flexion       Knee extension       Ankle dorsiflexion -10/0d   0/8d  Ankle plantarflexion WFL     Ankle inversion 10/22d   25/35d  Ankle eversion 0/8d   20/25d   (Blank rows = not tested)   LOWER EXTREMITY MMT:   MMT Right eval Left eval R 10/19/22  Hip flexion       Hip extension       Hip abduction       Hip adduction       Hip internal rotation       Hip external rotation       Knee flexion       Knee extension       Ankle dorsiflexion 2   3-  Ankle plantarflexion 2+   3-  Ankle inversion 2   3-  Ankle eversion 2   3-   (Blank  rows = not tested)   LOWER EXTREMITY SPECIAL TESTS:  Deferred post-op   FUNCTIONAL TESTS:  Deferred post-op   GAIT: Distance walked: 59ftx2 Assistive device utilized: Crutches Level of assistance: Modified independence Comments: step to pattern  TREATMENT: [facility] Adult PT Treatment:                                                DATE: 10/24/22 Therapeutic Exercise: Nustep L4 8 min Step ups L 4 in 15x Lateral steps 4 in 15x 3 way ankle YTB 15x 2 ea. Countertop heel/toe and backward walking 4 trips Countertop side stepping 4 trips STM over anterolateral portal site elicited symptoms   [facility] Adult PT Treatment:                                                DATE: 10/19/22 Therapeutic Exercise: NuStep lvl 4 8 min Gastroc stretch 30s x2 Re-assessment   [facility] Adult PT Treatment:                                                DATE: 10/12/22 Therapeutic Exercise: NuStep lvl 2 8 min PF against wall 15x DF against wall 15x PF stretch against wall 30s x2 Runners step 4 in block R 10x Lateral  step up 4 in 10x leading R 3 way ankle YTB 15x ea,   [facility] Adult PT Treatment:                                                DATE: 10/10/22 Therapeutic Exercise: NuStep lvl 5 UE/LE x 6 min Gait through clinic wearing brace w/o crutch Standing heel raise 2x10 Step up fwd 4 in 15x leading R Lateral step up 4 in 15x leading R Long sitting calf stretch 2x30" Seated heel raise R over 4 in block 10# dumbbell 15x2    PATIENT EDUCATION:  Education details: N/A Person educated: Patient Education method: Explanation Education comprehension: verbalized understanding and needs further education   HOME EXERCISE PROGRAM: Access Code: [email or web address] URL: [email or web address] Date: 09/18/2022 Prepared by: Gustavus Bryant   Exercises - Long Sitting Plantar Fascia Stretch with Towel  - 3 x daily - 5 x weekly - 1 sets - 2 reps - 30s hold - Towel Scrunches  - 3 x daily - 5 x weekly - 1 sets  - 2 reps - 30s hold - Seated Great Toe Extension  - 3 x daily - 5 x weekly - 2 sets - 30 reps - Seated Lesser Toes Extension  - 3 x daily - 5 x weekly - 2 sets - 3 reps - Seated Ankle Alphabet  - 3 x daily - 5 x weekly - 1 sets - 26 reps - Seated Ankle Circles  - 3 x daily - 5 x weekly - 1 sets - 30 reps   ASSESSMENT:   CLINICAL IMPRESSION: Advanced to more challenging and dynamic balance and functional tasks.  Resumed T-band tasks for strength and endurance. Point tenderness noted to anterolateral portal site, instructed in massage and STM to resolve adhesions.   Eval: Patient is a 37 y.o. female who was seen today for physical therapy evaluation and treatment for Rankle pain, weakness and ROM deficits following surgical repair of peroneal tendons. She is WBAT in CAM boot using axillary crutches and step to gait.  Pain levels are high and patient is hypersensitive to scar region.  A/PROM and strength decreased in all planes.  Good PT and DP pulses noted, no redness, warmth or color changes observed.  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   OBJECTIVE IMPAIRMENTS: Abnormal gait, decreased activity tolerance, decreased coordination, decreased endurance, decreased knowledge of condition, decreased knowledge of use of DME, decreased mobility, difficulty walking, decreased ROM, decreased strength, increased edema, obesity, and pain.    ACTIVITY LIMITATIONS: carrying, lifting, standing, squatting, stairs, and locomotion level   PERSONAL FACTORS: Fitness, Past/current experiences, Time since onset of injury/illness/exacerbation, and 1 comorbidity: anxiety  are also affecting patient's functional outcome.    REHAB POTENTIAL: Good   CLINICAL DECISION MAKING: Evolving/moderate complexity   EVALUATION COMPLEXITY: Moderate     GOALS: Goals reviewed with patient? No   SHORT TERM GOALS: Target date: 10/16/2022   Patient to demonstrate  independence in HEP  Baseline:[email or web address] Goal status: Met   2.  Obtain FOTO Baseline: TBD Goal status: Ongoing 09/29/22: 38%   3.  Establish a step through gait pattern Baseline: step to using boot/crutches; 10/19/22 Step through pattern w/o need of ADs just ASO brace Goal status: Met     LONG TERM GOALS: Target date: 11/13/2022  Assess FOTO progress Baseline: 38% 59% predicted) Goal status: INITIAL   2.  Decrease pain to 6/10 Baseline: 10/10 Goal status: INITIAL   3.  Ambulate 276ft with LRAD using step through pattern. Baseline: 72ft with R boot and B axillary crutches Goal status: INITIAL   4.  Increase R ankle strength to 3+/5 Baseline:  MMT Right eval Left eval R 10/19/22  Hip flexion       Hip extension       Hip abduction       Hip adduction       Hip internal rotation       Hip external rotation       Knee flexion       Knee extension       Ankle dorsiflexion 2   3-  Ankle plantarflexion 2+   3-  Ankle inversion 2   3-  Ankle eversion 2   3-    Goal status: INITIAL   5.  Increase R ankle AROM to 10d DF, 30d Inv and 20d everison Baseline:  A/PROM Right eval Left eval R 10/19/22  Hip flexion       Hip extension       Hip abduction       Hip adduction       Hip internal rotation       Hip external rotation       Knee flexion       Knee extension       Ankle dorsiflexion -10/0d   0/8d  Ankle plantarflexion WFL     Ankle inversion 10/22d   25/35d  Ankle eversion 0/8d   20/25d    Goal status: INITIAL       PLAN:   PT FREQUENCY: 2x/week   PT DURATION: 8 weeks   PLANNED INTERVENTIONS: Therapeutic exercises, Therapeutic activity, Neuromuscular re-education, Balance training, Gait training, Patient/Family education, Self Care, Joint mobilization, Stair training, DME instructions, Aquatic Therapy, Dry Needling, Manual therapy, and Re-evaluation   PLAN FOR NEXT SESSION: HEP review, FOTO assessment, MD f/u update, ROM, proprioception,  desensitization tasks, OKC strengthening, gait training    Hildred Laser, PT 10/24/2022, 1:01 PM

## 2022-10-24 ENCOUNTER — Ambulatory Visit: Payer: BC Managed Care – PPO | Attending: Obstetrics and Gynecology

## 2022-10-24 DIAGNOSIS — M25571 Pain in right ankle and joints of right foot: Secondary | ICD-10-CM | POA: Diagnosis not present

## 2022-10-24 DIAGNOSIS — M25671 Stiffness of right ankle, not elsewhere classified: Secondary | ICD-10-CM

## 2022-10-24 DIAGNOSIS — M6281 Muscle weakness (generalized): Secondary | ICD-10-CM

## 2022-10-26 ENCOUNTER — Ambulatory Visit: Payer: BC Managed Care – PPO

## 2022-10-29 ENCOUNTER — Telehealth (INDEPENDENT_AMBULATORY_CARE_PROVIDER_SITE_OTHER): Payer: BC Managed Care – PPO | Admitting: Podiatry

## 2022-10-29 NOTE — Telephone Encounter (Signed)
Patient Brenda Tyler at 11:21 am requesting call back regarding prescription for pain medicaiton.  Phoned patient back at 1:59 pm: Patient requesting refill of  hydrocodone for pain. States medication most recently prescribed is not alleviating her pain. Advised her I would send message to Dr. Lilian Kapur. She related understanding.

## 2022-10-30 NOTE — Therapy (Unsigned)
OUTPATIENT PHYSICAL THERAPY TREATMENT NOTE   Patient Name: Brenda Tyler MRN: [id number] DOB:February 17, 1986, 37 y.o., female Today's Date: 10/31/2022  PCP: Huel Cote, MD  REFERRING PROVIDER: Edwin Cap, DPM   END OF SESSION:   PT End of Session - 10/31/22 1221     Visit Number 10    Number of Visits 16    Date for PT Re-Evaluation 11/13/22    Authorization Type BCBS    Authorization Time Period [employer] Secondary 10 visits 10/03/22-01/01/23    Authorization - Number of Visits 10    PT Start Time 1220    PT Stop Time 1300    PT Time Calculation (min) 40 min    Activity Tolerance Patient tolerated treatment well;Patient limited by pain    Behavior During Therapy [facility] for tasks assessed/performed                    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 - s/p Colpo 11/02/13 - CIN II -->    HIV (human immunodeficiency virus infection) (HCC)    HSV (herpes simplex virus) infection    Obesity    Oropharyngeal candidiasis 07/05/2014   Other fatigue    Shortness of breath on exertion    Past Surgical History:  Procedure Laterality Date   INCISION AND DRAINAGE ABSCESS     on abdomen and buttocks   Patient Active Problem List   Diagnosis Date Noted   Pre-diabetes 11/02/2020   Vitamin D deficiency 11/02/2020   At risk for diabetes mellitus 11/02/2020   Acute intractable headache 02/23/2020   Dizziness and giddiness 02/23/2020   Cyst of bone of left hand 12/10/2019   Visit for routine gyn exam 07/30/2019   Desire for pregnancy 07/30/2019   Dislocation of knee, anterior, left, closed 01/21/2016   Closed anterior dislocation of left knee 01/21/2016   Depression 07/26/2014   Herpes labialis 07/03/2014   HIV disease (HCC) 04/23/2014   General counseling for prescription of  oral contraceptives 06/11/2013   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   Obesity 12/22/2012    REFERRING DIAG: M25.371 (ICD-10-CM) - Ankle instability, right   THERAPY DIAG:  Pain in right ankle and joints of right foot  Muscle weakness (generalized)  Decreased range of motion of right ankle  Rationale for Evaluation and Treatment Rehabilitation  PERTINENT HISTORY: R peroneal tendon repair on 08/10/22  PRECAUTIONS: WB on 09/13/22 - DOS 08/10/2022  SUBJECTIVE:  SUBJECTIVE STATEMENT:  Feeling better but ankle remains sore and painful.   PAIN:  Are you having pain?  Yes: NPRS scale: 8/10 Pain location: R ankle Pain description: ache Aggravating factors: weight bearing Relieving factors: rest elevation   OBJECTIVE: (objective measures completed at initial evaluation unless otherwise dated)  DIAGNOSTIC FINDINGS: none available   PATIENT SURVEYS:  FOTO 09/29/22: 38%(59% predicted); 10/31/22 38%   POSTURE: rounded shoulders and increased thoracic kyphosis   PALPATION: TTP over lateral malleolus and scar   LOWER EXTREMITY ROM:   A/PROM Right eval Left eval R 10/19/22  Hip flexion       Hip extension       Hip abduction       Hip adduction       Hip internal rotation       Hip external rotation       Knee flexion       Knee extension       Ankle dorsiflexion -10/0d   0/8d  Ankle plantarflexion WFL     Ankle inversion 10/22d   25/35d  Ankle eversion 0/8d   20/25d   (Blank rows = not tested)   LOWER EXTREMITY MMT:   MMT Right eval Left eval R 10/19/22  Hip flexion       Hip extension       Hip abduction       Hip adduction       Hip internal rotation       Hip external rotation       Knee flexion       Knee extension       Ankle dorsiflexion 2   3-  Ankle plantarflexion 2+   3-  Ankle  inversion 2   3-  Ankle eversion 2   3-   (Blank rows = not tested)   LOWER EXTREMITY SPECIAL TESTS:  Deferred post-op   FUNCTIONAL TESTS:  Deferred post-op   GAIT: Distance walked: 64ftx2 Assistive device utilized: Crutches Level of assistance: Modified independence Comments: step to pattern  TREATMENT: [facility] Adult PT Treatment:                                                DATE: 10/31/22 Therapeutic Exercise: Nustep L5 8 min Step overs 6 in 5x over and back with countertop support 3 way ankle RTB 15x 2 ea. Countertop heel/toe and backward walking 3 trips Countertop side stepping 3 trips L LE march against wall 15x   [facility] Adult PT Treatment:                                                DATE: 10/24/22 Therapeutic Exercise: Nustep L4 8 min Step ups L 4 in 15x Lateral steps 4 in 15x 3 way ankle YTB 15x 2 ea. Countertop heel/toe and backward walking 4 trips Countertop side stepping 4 trips STM over anterolateral portal site elicited symptoms   [facility] Adult PT Treatment:                                                DATE:  10/19/22 Therapeutic Exercise: NuStep lvl 4 8 min Gastroc stretch 30s x2 Re-assessment   [facility] Adult PT Treatment:                                                DATE: 10/12/22 Therapeutic Exercise: NuStep lvl 2 8 min PF against wall 15x DF against wall 15x PF stretch against wall 30s x2 Runners step 4 in block R 10x Lateral step up 4 in 10x leading R 3 way ankle YTB 15x ea,   [facility] Adult PT Treatment:                                                DATE: 10/10/22 Therapeutic Exercise: NuStep lvl 5 UE/LE x 6 min Gait through clinic wearing brace w/o crutch Standing heel raise 2x10 Step up fwd 4 in 15x leading R Lateral step up 4 in 15x leading R Long sitting calf stretch 2x30" Seated heel raise R over 4 in block 10# dumbbell 15x2    PATIENT EDUCATION:  Education details: N/A Person educated: Patient Education method: Explanation Education  comprehension: verbalized understanding and needs further education   HOME EXERCISE PROGRAM: Access Code: [email or web address] URL: [email or web address] Date: 09/18/2022 Prepared by: Gustavus Bryant   Exercises - Long Sitting Plantar Fascia Stretch with Towel  - 3 x daily - 5 x weekly - 1 sets - 2 reps - 30s hold - Towel Scrunches  - 3 x daily - 5 x weekly - 1 sets - 2 reps - 30s hold - Seated Great Toe Extension  - 3 x daily - 5 x weekly - 2 sets - 30 reps - Seated Lesser Toes Extension  - 3 x daily - 5 x weekly - 2 sets - 3 reps - Seated Ankle Alphabet  - 3 x daily - 5 x weekly - 1 sets - 26 reps - Seated Ankle Circles  - 3 x daily - 5 x weekly - 1 sets - 30 reps   ASSESSMENT:   CLINICAL IMPRESSION: Continued to challenge balance, strength and SLS tasks incorporating stepping tasks.  Did not allow visual support to improve proprioception.  All tasks performed outside of ASO and able to complete all tasks despite c/o soreness.    Eval: Patient is a 37 y.o. female who was seen today for physical therapy evaluation and treatment for Rankle pain, weakness and ROM deficits following surgical repair of peroneal tendons. She is WBAT in CAM boot using axillary crutches and step to gait.  Pain levels are high and patient is hypersensitive to scar region.  A/PROM and strength decreased in all planes.  Good PT and DP pulses noted, no redness, warmth or color changes observed.  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   OBJECTIVE IMPAIRMENTS: Abnormal gait, decreased activity tolerance, decreased coordination, decreased endurance, decreased knowledge of condition, decreased knowledge of use of DME, decreased mobility, difficulty walking, decreased ROM, decreased strength, increased edema, obesity, and pain.    ACTIVITY LIMITATIONS: carrying, lifting, standing, squatting, stairs, and locomotion level   PERSONAL FACTORS: Fitness,  Past/current experiences, Time since onset of injury/illness/exacerbation, and 1 comorbidity: anxiety  are also affecting patient's functional outcome.  REHAB POTENTIAL: Good   CLINICAL DECISION MAKING: Evolving/moderate complexity   EVALUATION COMPLEXITY: Moderate     GOALS: Goals reviewed with patient? No   SHORT TERM GOALS: Target date: 10/16/2022   Patient to demonstrate independence in HEP  Baseline:[email or web address] Goal status: Met   2.  Obtain FOTO Baseline: TBD; 09/29/22: 38%; 10/31/22 38% Goal status: Met    3.  Establish a step through gait pattern Baseline: step to using boot/crutches; 10/19/22 Step through pattern w/o need of ADs just ASO brace Goal status: Met     LONG TERM GOALS: Target date: 11/13/2022   Assess FOTO progress Baseline: 38% (59% predicted); 10/31/22 38% Goal status: Ongoing   2.  Decrease pain to 6/10 Baseline: 10/10 Goal status: INITIAL   3.  Ambulate 2103ft with LRAD using step through pattern. Baseline: 97ft with R boot and B axillary crutches; 10/31/22 227ft with ASO only using step through pattern Goal status: Met   4.  Increase R ankle strength to 3+/5 Baseline:  MMT Right eval Left eval R 10/19/22  Hip flexion       Hip extension       Hip abduction       Hip adduction       Hip internal rotation       Hip external rotation       Knee flexion       Knee extension       Ankle dorsiflexion 2   3-  Ankle plantarflexion 2+   3-  Ankle inversion 2   3-  Ankle eversion 2   3-    Goal status: Ongoing   5.  Increase R ankle AROM to 10d DF, 30d Inv and 20d everison Baseline:  A/PROM Right eval Left eval R 10/19/22  Hip flexion       Hip extension       Hip abduction       Hip adduction       Hip internal rotation       Hip external rotation       Knee flexion       Knee extension       Ankle dorsiflexion -10/0d   0/8d  Ankle plantarflexion WFL     Ankle inversion 10/22d   25/35d  Ankle eversion 0/8d   20/25d    Goal status:  Ongoing       PLAN:   PT FREQUENCY: 2x/week   PT DURATION: 8 weeks   PLANNED INTERVENTIONS: Therapeutic exercises, Therapeutic activity, Neuromuscular re-education, Balance training, Gait training, Patient/Family education, Self Care, Joint mobilization, Stair training, DME instructions, Aquatic Therapy, Dry Needling, Manual therapy, and Re-evaluation   PLAN FOR NEXT SESSION: HEP review, FOTO assessment, MD f/u update, ROM, proprioception, desensitization tasks, OKC strengthening, gait training    Hildred Laser, PT 10/31/2022, 1:10 PM

## 2022-10-31 ENCOUNTER — Ambulatory Visit: Payer: BC Managed Care – PPO

## 2022-10-31 DIAGNOSIS — M25571 Pain in right ankle and joints of right foot: Secondary | ICD-10-CM

## 2022-10-31 DIAGNOSIS — M6281 Muscle weakness (generalized): Secondary | ICD-10-CM

## 2022-10-31 DIAGNOSIS — M25671 Stiffness of right ankle, not elsewhere classified: Secondary | ICD-10-CM | POA: Diagnosis not present

## 2022-11-01 ENCOUNTER — Ambulatory Visit (INDEPENDENT_AMBULATORY_CARE_PROVIDER_SITE_OTHER): Payer: BC Managed Care – PPO | Admitting: Podiatry

## 2022-11-01 ENCOUNTER — Ambulatory Visit (INDEPENDENT_AMBULATORY_CARE_PROVIDER_SITE_OTHER): Payer: BC Managed Care – PPO

## 2022-11-01 DIAGNOSIS — S86311S Strain of muscle(s) and tendon(s) of peroneal muscle group at lower leg level, right leg, sequela: Secondary | ICD-10-CM

## 2022-11-01 DIAGNOSIS — Z9889 Other specified postprocedural states: Secondary | ICD-10-CM

## 2022-11-01 DIAGNOSIS — M25871 Other specified joint disorders, right ankle and foot: Secondary | ICD-10-CM

## 2022-11-01 DIAGNOSIS — M19071 Primary osteoarthritis, right ankle and foot: Secondary | ICD-10-CM

## 2022-11-01 DIAGNOSIS — M7751 Other enthesopathy of right foot: Secondary | ICD-10-CM

## 2022-11-01 DIAGNOSIS — M25371 Other instability, right ankle: Secondary | ICD-10-CM

## 2022-11-01 MED ORDER — HYDROCODONE-ACETAMINOPHEN 5-325 MG PO TABS: 1.0000 | ORAL_TABLET | ORAL | 0 refills | Status: AC | PRN

## 2022-11-01 NOTE — Therapy (Signed)
OUTPATIENT PHYSICAL THERAPY TREATMENT NOTE   Patient Name: Brenda Tyler MRN: [id number] DOB:05/05/1986, 37 y.o., female Today's Date: 11/02/2022  PCP: Huel Cote, MD  REFERRING PROVIDER: Edwin Cap, DPM   END OF SESSION:   PT End of Session - 11/02/22 1219     Visit Number 11    Number of Visits 16    Date for PT Re-Evaluation 11/13/22    Authorization Type BC[employer]    Authorization Time Period [employer] Secondary 10 visits 10/03/22-01/01/23    Authorization - Number of Visits 10    PT Start Time 1220    PT Stop Time 1300    PT Time Calculation (min) 40 min    Activity Tolerance Patient tolerated treatment well;Patient limited by pain    Behavior During Therapy [facility] for tasks assessed/performed                    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 - s/p Colpo 11/02/13 - CIN II -->    HIV (human immunodeficiency virus infection) (HCC)    HSV (herpes simplex virus) infection    Obesity    Oropharyngeal candidiasis 07/05/2014   Other fatigue    Shortness of breath on exertion    Past Surgical History:  Procedure Laterality Date   INCISION AND DRAINAGE ABSCESS     on abdomen and buttocks   Patient Active Problem List   Diagnosis Date Noted   Pre-diabetes 11/02/2020   Vitamin D deficiency 11/02/2020   At risk for diabetes mellitus 11/02/2020   Acute intractable headache 02/23/2020   Dizziness and giddiness 02/23/2020   Cyst of bone of left hand 12/10/2019   Visit for routine gyn exam 07/30/2019   Desire for pregnancy 07/30/2019   Dislocation of knee, anterior, left, closed 01/21/2016   Closed anterior dislocation of left knee 01/21/2016   Depression 07/26/2014   Herpes labialis 07/03/2014   HIV disease (HCC) 04/23/2014   General counseling for prescription  of oral contraceptives 06/11/2013   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   Obesity 12/22/2012    REFERRING DIAG: M25.371 (ICD-10-CM) - Ankle instability, right   THERAPY DIAG:  Pain in right ankle and joints of right foot  Muscle weakness (generalized)  Decreased range of motion of right ankle  Rationale for Evaluation and Treatment Rehabilitation  PERTINENT HISTORY: R peroneal tendon repair on 08/10/22  PRECAUTIONS: WB on 09/13/22 - DOS 08/10/2022  SUBJECTIVE:  SUBJECTIVE STATEMENT:  Saw MD, had R ankle injected and MRI has been ordered and scheduled for 11/06/22.  R ankle remains sore and stiff   PAIN:  Are you having pain?  Yes: NPRS scale: 8/10 Pain location: R ankle Pain description: ache Aggravating factors: weight bearing Relieving factors: rest elevation   OBJECTIVE: (objective measures completed at initial evaluation unless otherwise dated)  DIAGNOSTIC FINDINGS: none available   PATIENT SURVEYS:  FOTO 09/29/22: 38%(59% predicted); 10/31/22 38%   POSTURE: rounded shoulders and increased thoracic kyphosis   PALPATION: TTP over lateral malleolus and scar   LOWER EXTREMITY ROM:   A/PROM Right eval Left eval R 10/19/22  Hip flexion       Hip extension       Hip abduction       Hip adduction       Hip internal rotation       Hip external rotation       Knee flexion       Knee extension       Ankle dorsiflexion -10/0d   0/8d  Ankle plantarflexion WFL     Ankle inversion 10/22d   25/35d  Ankle eversion 0/8d   20/25d   (Blank rows = not tested)   LOWER EXTREMITY MMT:   MMT Right eval Left eval R 10/19/22  Hip flexion       Hip extension       Hip abduction       Hip adduction       Hip internal rotation       Hip external rotation       Knee flexion       Knee extension        Ankle dorsiflexion 2   3-  Ankle plantarflexion 2+   3-  Ankle inversion 2   3-  Ankle eversion 2   3-   (Blank rows = not tested)   LOWER EXTREMITY SPECIAL TESTS:  Deferred post-op   FUNCTIONAL TESTS:  Deferred post-op   GAIT: Distance walked: 49ftx2 Assistive device utilized: Crutches Level of assistance: Modified independence Comments: step to pattern  TREATMENT: [facility] Adult PT Treatment:                                                DATE: 11/02/22 Therapeutic Exercise: Nustep L2 8 min Seated PF stretch with towel 30s x3 Seated R rocker board PF/DF 30x 2 DF hold 3 way ankle YTB 15x 2 ea. Stepping onto 8 in block and weight shifting 15x  [facility] Adult PT Treatment:                                                DATE: 10/31/22 Therapeutic Exercise: Nustep L5 8 min Step overs 6 in 5x over and back with countertop support 3 way ankle RTB 15x 2 ea. Countertop heel/toe and backward walking 3 trips Countertop side stepping 3 trips L LE march against wall 15x   [facility] Adult PT Treatment:  DATE: 10/24/22 Therapeutic Exercise: Nustep L4 8 min Step ups L 4 in 15x Lateral steps 4 in 15x 3 way ankle YTB 15x 2 ea. Countertop heel/toe and backward walking 4 trips Countertop side stepping 4 trips STM over anterolateral portal site elicited symptoms    PATIENT EDUCATION:  Education details: N/A Person educated: Patient Education method: Explanation Education comprehension: verbalized understanding and needs further education   HOME EXERCISE PROGRAM: Access Code: [email or web address] URL: [email or web address] Date: 09/18/2022 Prepared by: Gustavus Bryant   Exercises - Long Sitting Plantar Fascia Stretch with Towel  - 3 x daily - 5 x weekly - 1 sets - 2 reps - 30s hold - Towel Scrunches  - 3 x daily - 5 x weekly - 1 sets - 2 reps - 30s hold - Seated Great Toe Extension  - 3 x daily - 5 x weekly - 2 sets - 30 reps - Seated Lesser  Toes Extension  - 3 x daily - 5 x weekly - 2 sets - 3 reps - Seated Ankle Alphabet  - 3 x daily - 5 x weekly - 1 sets - 26 reps - Seated Ankle Circles  - 3 x daily - 5 x weekly - 1 sets - 30 reps   ASSESSMENT:   CLINICAL IMPRESSION: Intensity of session decreased anf focus placed on ROM and stretching following recent CSI and resultant icreased pain, soreness and diminished proprioception due to increased fluid in ankle joint.   Eval: Patient is a 37 y.o. female who was seen today for physical therapy evaluation and treatment for Rankle pain, weakness and ROM deficits following surgical repair of peroneal tendons. She is WBAT in CAM boot using axillary crutches and step to gait.  Pain levels are high and patient is hypersensitive to scar region.  A/PROM and strength decreased in all planes.  Good PT and DP pulses noted, no redness, warmth or color changes observed.  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   OBJECTIVE IMPAIRMENTS: Abnormal gait, decreased activity tolerance, decreased coordination, decreased endurance, decreased knowledge of condition, decreased knowledge of use of DME, decreased mobility, difficulty walking, decreased ROM, decreased strength, increased edema, obesity, and pain.    ACTIVITY LIMITATIONS: carrying, lifting, standing, squatting, stairs, and locomotion level   PERSONAL FACTORS: Fitness, Past/current experiences, Time since onset of injury/illness/exacerbation, and 1 comorbidity: anxiety  are also affecting patient's functional outcome.    REHAB POTENTIAL: Good   CLINICAL DECISION MAKING: Evolving/moderate complexity   EVALUATION COMPLEXITY: Moderate     GOALS: Goals reviewed with patient? No   SHORT TERM GOALS: Target date: 10/16/2022   Patient to demonstrate independence in HEP  Baseline:[email or web address] Goal status: Met   2.  Obtain FOTO Baseline: TBD; 09/29/22: 38%; 10/31/22 38% Goal status: Met     3.  Establish a step through gait pattern Baseline: step to using boot/crutches; 10/19/22 Step through pattern w/o need of ADs just ASO brace Goal status: Met     LONG TERM GOALS: Target date: 11/13/2022   Assess FOTO progress Baseline: 38% (59% predicted); 10/31/22 38% Goal status: Ongoing   2.  Decrease pain to 6/10 Baseline: 10/10 Goal status: INITIAL   3.  Ambulate 276ft with LRAD using step through pattern. Baseline: 62ft with R boot and B axillary crutches; 10/31/22 232ft with ASO only using step through pattern Goal status: Met   4.  Increase R ankle strength to 3+/5 Baseline:  MMT Right eval Left eval  R 10/19/22  Hip flexion       Hip extension       Hip abduction       Hip adduction       Hip internal rotation       Hip external rotation       Knee flexion       Knee extension       Ankle dorsiflexion 2   3-  Ankle plantarflexion 2+   3-  Ankle inversion 2   3-  Ankle eversion 2   3-    Goal status: Ongoing   5.  Increase R ankle AROM to 10d DF, 30d Inv and 20d everison Baseline:  A/PROM Right eval Left eval R 10/19/22  Hip flexion       Hip extension       Hip abduction       Hip adduction       Hip internal rotation       Hip external rotation       Knee flexion       Knee extension       Ankle dorsiflexion -10/0d   0/8d  Ankle plantarflexion WFL     Ankle inversion 10/22d   25/35d  Ankle eversion 0/8d   20/25d    Goal status: Ongoing       PLAN:   PT FREQUENCY: 2x/week   PT DURATION: 8 weeks   PLANNED INTERVENTIONS: Therapeutic exercises, Therapeutic activity, Neuromuscular re-education, Balance training, Gait training, Patient/Family education, Self Care, Joint mobilization, Stair training, DME instructions, Aquatic Therapy, Dry Needling, Manual therapy, and Re-evaluation   PLAN FOR NEXT SESSION: HEP review, FOTO assessment, MD f/u update, ROM, proprioception, desensitization tasks, OKC strengthening, gait training    Hildred Laser,  PT 11/02/2022, 12:56 PM

## 2022-11-02 ENCOUNTER — Ambulatory Visit: Payer: BC Managed Care – PPO

## 2022-11-02 DIAGNOSIS — M25671 Stiffness of right ankle, not elsewhere classified: Secondary | ICD-10-CM | POA: Diagnosis not present

## 2022-11-02 DIAGNOSIS — M25571 Pain in right ankle and joints of right foot: Secondary | ICD-10-CM

## 2022-11-02 DIAGNOSIS — M6281 Muscle weakness (generalized): Secondary | ICD-10-CM

## 2022-11-05 NOTE — Progress Notes (Signed)
  Subjective:  Patient ID: Brenda Tyler, female    DOB: 09/12/1985,  MRN: [id number]  Chief Complaint  Patient presents with   Routine Post Op    POV#4 DOS 02.16.24 ANKLE ARTHROSCOPY & ANKLE STABILIZATION RT      37 y.o. female returns for post-op check.   She has been doing therapy, still having quite a bit of pain.  Has difficulty ambulating in the brace.  Feels like burning shooting pain across the top of the foot and side of the ankle  Review of Systems: Negative except as noted in the HPI. Denies N/V/F/Ch.   Objective:  There were no vitals filed for this visit. There is no height or weight on file to calculate BMI. Constitutional Well developed. Well nourished.  Vascular Foot warm and well perfused. Capillary refill normal to all digits.  Calf is soft and supple, no posterior calf or knee pain, negative Homans' sign  Neurologic Normal speech. Oriented to person, place, and time. Epicritic sensation to light touch grossly present bilaterally.  No allodynia or signs of CRPS type I or II  Dermatologic Incisions are well-healed not hypertrophic, tenderness along the entire length of the lateral scar  Orthopedic: Mild edema over dorsal foot, she has good stability of the ankle stabilization no pain to palpation of the surgical site good range of motion of the ankle   Multiple view plain film radiographs: New films taken today show unchanged alignment and no new osseous abnormalities Assessment:   1. Arthritis of right subtalar joint   2. Impingement syndrome of right ankle   3. Status post right foot surgery   4. Tear of peroneal tendon, right, sequela   5. Capsulitis of ankle, right   6. Ankle instability, right    Plan:  Patient was evaluated and treated and all questions answered.  S/p foot surgery right -We reviewed her radiographs today.  Unfortunately still having quite a bit of pain.  A large amount of his pain is consistent with scar pain on the lateral incision.   Has not made much progress or improvement yet.  I recommended corticosteroid injection of this area.  Following sterile prep with alcohol and consent, 4 mg of dexamethasone and 1 cc of 0.5% Marcaine plain was injected along the lateral scar in a subcutaneous manner.  She tolerated this well.  Norco refill sent to pharmacy take only as needed.  Continue gabapentin.  Continue physical therapy as well.  Considering her minimal improvement since surgery I would recommend a new MRI at this point.  Would like to reevaluate ankle joint and ligaments to see what if there are issues are present.  If there is significant bone marrow edema may need to return to rest and nonweightbearing with knee scooter.  Return in about 5 weeks (around 12/06/2022) for follow up ankle pain after surgery, MRI review, injection f/u .

## 2022-11-05 NOTE — Therapy (Deleted)
OUTPATIENT PHYSICAL THERAPY TREATMENT NOTE   Patient Name: Brenda Tyler MRN: [id number] DOB:07/14/1985, 37 y.o., female Today's Date: 11/02/2022  PCP: Richardson, Kathy, MD  REFERRING PROVIDER: McDonald, Adam R, DPM   END OF SESSION:   PT End of Session - 11/02/22 1219     Visit Number 11    Number of Visits 16    Date for PT Re-Evaluation 11/13/22    Authorization Type BCBS    Authorization Time Period [employer] Secondary 10 visits 10/03/22-01/01/23    Authorization - Number of Visits 10    PT Start Time 1220    PT Stop Time 1300    PT Time Calculation (min) 40 min    Activity Tolerance Patient tolerated treatment well;Patient limited by pain    Behavior During Therapy WFL for tasks assessed/performed                    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 - s/p Colpo 11/02/13 - CIN II -->    HIV (human immunodeficiency virus infection) (HCC)    HSV (herpes simplex virus) infection    Obesity    Oropharyngeal candidiasis 07/05/2014   Other fatigue    Shortness of breath on exertion    Past Surgical History:  Procedure Laterality Date   INCISION AND DRAINAGE ABSCESS     on abdomen and buttocks   Patient Active Problem List   Diagnosis Date Noted   Pre-diabetes 11/02/2020   Vitamin D deficiency 11/02/2020   At risk for diabetes mellitus 11/02/2020   Acute intractable headache 02/23/2020   Dizziness and giddiness 02/23/2020   Cyst of bone of left hand 12/10/2019   Visit for routine gyn exam 07/30/2019   Desire for pregnancy 07/30/2019   Dislocation of knee, anterior, left, closed 01/21/2016   Closed anterior dislocation of left knee 01/21/2016   Depression 07/26/2014   Herpes labialis 07/03/2014   HIV disease (HCC) 04/23/2014   General counseling for prescription  of oral contraceptives 06/11/2013   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   Obesity 12/22/2012    REFERRING DIAG: M25.371 (ICD-10-CM) - Ankle instability, right   THERAPY DIAG:  Pain in right ankle and joints of right foot  Muscle weakness (generalized)  Decreased range of motion of right ankle  Rationale for Evaluation and Treatment Rehabilitation  PERTINENT HISTORY: R peroneal tendon repair on 08/10/22  PRECAUTIONS: WB on 09/13/22 - DOS 08/10/2022  SUBJECTIVE:                                                                                                                                                                                        SUBJECTIVE STATEMENT:  Saw MD, had R ankle injected and MRI has been ordered and scheduled for 11/06/22.  R ankle remains sore and stiff   PAIN:  Are you having pain?  Yes: NPRS scale: 8/10 Pain location: R ankle Pain description: ache Aggravating factors: weight bearing Relieving factors: rest elevation   OBJECTIVE: (objective measures completed at initial evaluation unless otherwise dated)  DIAGNOSTIC FINDINGS: none available   PATIENT SURVEYS:  FOTO 09/29/22: 38%(59% predicted); 10/31/22 38%   POSTURE: rounded shoulders and increased thoracic kyphosis   PALPATION: TTP over lateral malleolus and scar   LOWER EXTREMITY ROM:   A/PROM Right eval Left eval R 10/19/22  Hip flexion       Hip extension       Hip abduction       Hip adduction       Hip internal rotation       Hip external rotation       Knee flexion       Knee extension       Ankle dorsiflexion -10/0d   0/8d  Ankle plantarflexion WFL     Ankle inversion 10/22d   25/35d  Ankle eversion 0/8d   20/25d   (Blank rows = not tested)   LOWER EXTREMITY MMT:   MMT Right eval Left eval R 10/19/22  Hip flexion       Hip extension       Hip abduction       Hip adduction       Hip internal rotation       Hip external rotation       Knee flexion       Knee extension        Ankle dorsiflexion 2   3-  Ankle plantarflexion 2+   3-  Ankle inversion 2   3-  Ankle eversion 2   3-   (Blank rows = not tested)   LOWER EXTREMITY SPECIAL TESTS:  Deferred post-op   FUNCTIONAL TESTS:  Deferred post-op   GAIT: Distance walked: 75ftx2 Assistive device utilized: Crutches Level of assistance: Modified independence Comments: step to pattern  TREATMENT: [facility] Adult PT Treatment:                                                DATE: 11/02/22 Therapeutic Exercise: Nustep L2 8 min Seated PF stretch with towel 30s x3 Seated R rocker board PF/DF 30x 2 DF hold 3 way ankle YTB 15x 2 ea. Stepping onto 8 in block and weight shifting 15x  [facility] Adult PT Treatment:                                                DATE: 10/31/22 Therapeutic Exercise: Nustep L5 8 min Step overs 6 in 5x over and back with countertop support 3 way ankle RTB 15x 2 ea. Countertop heel/toe and backward walking 3 trips Countertop side stepping 3 trips L LE march against wall 15x   [facility] Adult PT Treatment:                                                  DATE: 10/24/22 Therapeutic Exercise: Nustep L4 8 min Step ups L 4 in 15x Lateral steps 4 in 15x 3 way ankle YTB 15x 2 ea. Countertop heel/toe and backward walking 4 trips Countertop side stepping 4 trips STM over anterolateral portal site elicited symptoms    PATIENT EDUCATION:  Education details: N/A Person educated: Patient Education method: Explanation Education comprehension: verbalized understanding and needs further education   HOME EXERCISE PROGRAM: Access Code: [email or web address] URL: [email or web address] Date: 09/18/2022 Prepared by: Briscoe Daniello   Exercises - Long Sitting Plantar Fascia Stretch with Towel  - 3 x daily - 5 x weekly - 1 sets - 2 reps - 30s hold - Towel Scrunches  - 3 x daily - 5 x weekly - 1 sets - 2 reps - 30s hold - Seated Great Toe Extension  - 3 x daily - 5 x weekly - 2 sets - 30 reps - Seated Lesser  Toes Extension  - 3 x daily - 5 x weekly - 2 sets - 3 reps - Seated Ankle Alphabet  - 3 x daily - 5 x weekly - 1 sets - 26 reps - Seated Ankle Circles  - 3 x daily - 5 x weekly - 1 sets - 30 reps   ASSESSMENT:   CLINICAL IMPRESSION: Intensity of session decreased anf focus placed on ROM and stretching following recent CSI and resultant icreased pain, soreness and diminished proprioception due to increased fluid in ankle joint.   Eval: Patient is a 36 y.o. female who was seen today for physical therapy evaluation and treatment for Rankle pain, weakness and ROM deficits following surgical repair of peroneal tendons. She is WBAT in CAM boot using axillary crutches and step to gait.  Pain levels are high and patient is hypersensitive to scar region.  A/PROM and strength decreased in all planes.  Good PT and DP pulses noted, no redness, warmth or color changes observed.  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   OBJECTIVE IMPAIRMENTS: Abnormal gait, decreased activity tolerance, decreased coordination, decreased endurance, decreased knowledge of condition, decreased knowledge of use of DME, decreased mobility, difficulty walking, decreased ROM, decreased strength, increased edema, obesity, and pain.    ACTIVITY LIMITATIONS: carrying, lifting, standing, squatting, stairs, and locomotion level   PERSONAL FACTORS: Fitness, Past/current experiences, Time since onset of injury/illness/exacerbation, and 1 comorbidity: anxiety  are also affecting patient's functional outcome.    REHAB POTENTIAL: Good   CLINICAL DECISION MAKING: Evolving/moderate complexity   EVALUATION COMPLEXITY: Moderate     GOALS: Goals reviewed with patient? No   SHORT TERM GOALS: Target date: 10/16/2022   Patient to demonstrate independence in HEP  Baseline:[email or web address] Goal status: Met   2.  Obtain FOTO Baseline: TBD; 09/29/22: 38%; 10/31/22 38% Goal status: Met     3.  Establish a step through gait pattern Baseline: step to using boot/crutches; 10/19/22 Step through pattern w/o need of ADs just ASO brace Goal status: Met     LONG TERM GOALS: Target date: 11/13/2022   Assess FOTO progress Baseline: 38% (59% predicted); 10/31/22 38% Goal status: Ongoing   2.  Decrease pain to 6/10 Baseline: 10/10 Goal status: INITIAL   3.  Ambulate 200ft with LRAD using step through pattern. Baseline: 75ft with R boot and B axillary crutches; 10/31/22 200ft with ASO only using step through pattern Goal status: Met   4.  Increase R ankle strength to 3+/5 Baseline:  MMT Right eval Left eval   R 10/19/22  Hip flexion       Hip extension       Hip abduction       Hip adduction       Hip internal rotation       Hip external rotation       Knee flexion       Knee extension       Ankle dorsiflexion 2   3-  Ankle plantarflexion 2+   3-  Ankle inversion 2   3-  Ankle eversion 2   3-    Goal status: Ongoing   5.  Increase R ankle AROM to 10d DF, 30d Inv and 20d everison Baseline:  A/PROM Right eval Left eval R 10/19/22  Hip flexion       Hip extension       Hip abduction       Hip adduction       Hip internal rotation       Hip external rotation       Knee flexion       Knee extension       Ankle dorsiflexion -10/0d   0/8d  Ankle plantarflexion WFL     Ankle inversion 10/22d   25/35d  Ankle eversion 0/8d   20/25d    Goal status: Ongoing       PLAN:   PT FREQUENCY: 2x/week   PT DURATION: 8 weeks   PLANNED INTERVENTIONS: Therapeutic exercises, Therapeutic activity, Neuromuscular re-education, Balance training, Gait training, Patient/Family education, Self Care, Joint mobilization, Stair training, DME instructions, Aquatic Therapy, Dry Needling, Manual therapy, and Re-evaluation   PLAN FOR NEXT SESSION: HEP review, FOTO assessment, MD f/u update, ROM, proprioception, desensitization tasks, OKC strengthening, gait training    Tine Mabee M Vishnu Moeller,  PT 11/02/2022, 12:56 PM     

## 2022-11-06 ENCOUNTER — Ambulatory Visit
Admission: RE | Admit: 2022-11-06 | Discharge: 2022-11-06 | Disposition: A | Discharge status: Home / Self Care | Payer: BC Managed Care – PPO | Source: Ambulatory Visit | Attending: Podiatry | Admitting: Podiatry

## 2022-11-06 DIAGNOSIS — M25871 Other specified joint disorders, right ankle and foot: Secondary | ICD-10-CM

## 2022-11-06 DIAGNOSIS — S86311S Strain of muscle(s) and tendon(s) of peroneal muscle group at lower leg level, right leg, sequela: Secondary | ICD-10-CM | POA: Diagnosis not present

## 2022-11-06 DIAGNOSIS — Z9889 Other specified postprocedural states: Secondary | ICD-10-CM | POA: Diagnosis not present

## 2022-11-06 DIAGNOSIS — M25371 Other instability, right ankle: Secondary | ICD-10-CM | POA: Diagnosis not present

## 2022-11-07 ENCOUNTER — Ambulatory Visit: Payer: BC Managed Care – PPO

## 2022-11-08 NOTE — Therapy (Signed)
OUTPATIENT PHYSICAL THERAPY TREATMENT NOTE   Patient Name: Brenda Tyler MRN: [id number] DOB:04-25-1986, 37 y.o., female Today's Date: 11/09/2022  PCP: Huel Cote, MD  REFERRING PROVIDER: Edwin Cap, DPM   END OF SESSION:   PT End of Session - 11/09/22 1217     Visit Number 12    Number of Visits 16    Date for PT Re-Evaluation 11/13/22    Authorization Type BC[employer]    Authorization Time Period [employer] Secondary 10 visits 10/03/22-01/01/23    Authorization - Number of Visits 10    PT Start Time 1215    PT Stop Time 1300    PT Time Calculation (min) 45 min    Activity Tolerance Patient tolerated treatment well;Patient limited by pain    Behavior During Therapy [facility] for tasks assessed/performed                     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 - s/p Colpo 11/02/13 - CIN II -->    HIV (human immunodeficiency virus infection) (HCC)    HSV (herpes simplex virus) infection    Obesity    Oropharyngeal candidiasis 07/05/2014   Other fatigue    Shortness of breath on exertion    Past Surgical History:  Procedure Laterality Date   INCISION AND DRAINAGE ABSCESS     on abdomen and buttocks   Patient Active Problem List   Diagnosis Date Noted   Pre-diabetes 11/02/2020   Vitamin D deficiency 11/02/2020   At risk for diabetes mellitus 11/02/2020   Acute intractable headache 02/23/2020   Dizziness and giddiness 02/23/2020   Cyst of bone of left hand 12/10/2019   Visit for routine gyn exam 07/30/2019   Desire for pregnancy 07/30/2019   Dislocation of knee, anterior, left, closed 01/21/2016   Closed anterior dislocation of left knee 01/21/2016   Depression 07/26/2014   Herpes labialis 07/03/2014   HIV disease (HCC) 04/23/2014   General counseling for prescription  of oral contraceptives 06/11/2013   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   Obesity 12/22/2012    REFERRING DIAG: M25.371 (ICD-10-CM) - Ankle instability, right   THERAPY DIAG:  Pain in right ankle and joints of right foot  Muscle weakness (generalized)  Decreased range of motion of right ankle  Rationale for Evaluation and Treatment Rehabilitation  PERTINENT HISTORY: R peroneal tendon repair on 08/10/22  PRECAUTIONS: WB on 09/13/22 - DOS 08/10/2022  SUBJECTIVE:  SUBJECTIVE STATEMENT:  Reports 6/10 ankle soreness today, rainy weather bothers symptoms.   PAIN:  Are you having pain?  Yes: NPRS scale: 8/10 Pain location: R ankle Pain description: ache Aggravating factors: weight bearing Relieving factors: rest elevation   OBJECTIVE: (objective measures completed at initial evaluation unless otherwise dated)  DIAGNOSTIC FINDINGS: none available   PATIENT SURVEYS:  FOTO 09/29/22: 38%(59% predicted); 10/31/22 38%   POSTURE: rounded shoulders and increased thoracic kyphosis   PALPATION: TTP over lateral malleolus and scar   LOWER EXTREMITY ROM:   A/PROM Right eval Left eval R 10/19/22  Hip flexion       Hip extension       Hip abduction       Hip adduction       Hip internal rotation       Hip external rotation       Knee flexion       Knee extension       Ankle dorsiflexion -10/0d   0/8d  Ankle plantarflexion WFL     Ankle inversion 10/22d   25/35d  Ankle eversion 0/8d   20/25d   (Blank rows = not tested)   LOWER EXTREMITY MMT:   MMT Right eval Left eval R 10/19/22  Hip flexion       Hip extension       Hip abduction       Hip adduction       Hip internal rotation       Hip external rotation       Knee flexion       Knee extension       Ankle dorsiflexion 2   3-  Ankle  plantarflexion 2+   3-  Ankle inversion 2   3-  Ankle eversion 2   3-   (Blank rows = not tested)   LOWER EXTREMITY SPECIAL TESTS:  Deferred post-op   FUNCTIONAL TESTS:  Deferred post-op   GAIT: Distance walked: 33ftx2 Assistive device utilized: Crutches Level of assistance: Modified independence Comments: step to pattern  TREATMENT: [facility] Adult PT Treatment:                                                DATE: 11/09/22 Therapeutic Exercise: Nustep L5 8 min Runners step from 4 in block 15/15 with UE support 3 way ankle RTB 15x 2 ea. Manual Therapy: STM and scar massage to medial portal site and lateral incision site  [facility] Adult PT Treatment:                                                DATE: 11/02/22 Therapeutic Exercise: Nustep L2 8 min Seated PF stretch with towel 30s x3 Seated R rocker board PF/DF 30x 2 DF hold 3 way ankle YTB 15x 2 ea. Stepping onto 8 in block and weight shifting 15x  [facility] Adult PT Treatment:                                                DATE: 10/31/22 Therapeutic Exercise: Nustep L5 8 min Step overs 6 in  5x over and back with countertop support 3 way ankle RTB 15x 2 ea. Countertop heel/toe and backward walking 3 trips Countertop side stepping 3 trips L LE march against wall 15x   [facility] Adult PT Treatment:                                                DATE: 10/24/22 Therapeutic Exercise: Nustep L4 8 min Step ups L 4 in 15x Lateral steps 4 in 15x 3 way ankle YTB 15x 2 ea. Countertop heel/toe and backward walking 4 trips Countertop side stepping 4 trips STM over anterolateral portal site elicited symptoms    PATIENT EDUCATION:  Education details: N/A Person educated: Patient Education method: Explanation Education comprehension: verbalized understanding and needs further education   HOME EXERCISE PROGRAM: Access Code: [email or web address] URL: [email or web address] Date: 09/18/2022 Prepared by: Gustavus Bryant   Exercises - Long  Sitting Plantar Fascia Stretch with Towel  - 3 x daily - 5 x weekly - 1 sets - 2 reps - 30s hold - Towel Scrunches  - 3 x daily - 5 x weekly - 1 sets - 2 reps - 30s hold - Seated Great Toe Extension  - 3 x daily - 5 x weekly - 2 sets - 30 reps - Seated Lesser Toes Extension  - 3 x daily - 5 x weekly - 2 sets - 3 reps - Seated Ankle Alphabet  - 3 x daily - 5 x weekly - 1 sets - 26 reps - Seated Ankle Circles  - 3 x daily - 5 x weekly - 1 sets - 30 reps   ASSESSMENT:   CLINICAL IMPRESSION: Ankle symptoms returned to baseline, resumed prior level of resistance with exercises and proprioceptive tasks. Added extensive and aggressive scar mobilization to surgical sites, initially hypersensitive but symptoms subsided.  Able to detect a decrease in adhesions following technique. Advance to runners stepping for strength, balance and proprioception   Eval: Patient is a 37 y.o. female who was seen today for physical therapy evaluation and treatment for Rankle pain, weakness and ROM deficits following surgical repair of peroneal tendons. She is WBAT in CAM boot using axillary crutches and step to gait.  Pain levels are high and patient is hypersensitive to scar region.  A/PROM and strength decreased in all planes.  Good PT and DP pulses noted, no redness, warmth or color changes observed.  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   OBJECTIVE IMPAIRMENTS: Abnormal gait, decreased activity tolerance, decreased coordination, decreased endurance, decreased knowledge of condition, decreased knowledge of use of DME, decreased mobility, difficulty walking, decreased ROM, decreased strength, increased edema, obesity, and pain.    ACTIVITY LIMITATIONS: carrying, lifting, standing, squatting, stairs, and locomotion level   PERSONAL FACTORS: Fitness, Past/current experiences, Time since onset of injury/illness/exacerbation, and 1 comorbidity: anxiety  are also  affecting patient's functional outcome.    REHAB POTENTIAL: Good   CLINICAL DECISION MAKING: Evolving/moderate complexity   EVALUATION COMPLEXITY: Moderate     GOALS: Goals reviewed with patient? No   SHORT TERM GOALS: Target date: 10/16/2022   Patient to demonstrate independence in HEP  Baseline:[email or web address] Goal status: Met   2.  Obtain FOTO Baseline: TBD; 09/29/22: 38%; 10/31/22 38% Goal status: Met    3.  Establish a step through gait pattern Baseline: step  to using boot/crutches; 10/19/22 Step through pattern w/o need of ADs just ASO brace Goal status: Met     LONG TERM GOALS: Target date: 11/13/2022   Assess FOTO progress Baseline: 38% (59% predicted); 10/31/22 38% Goal status: Ongoing   2.  Decrease pain to 6/10 Baseline: 10/10 Goal status: INITIAL   3.  Ambulate 220ft with LRAD using step through pattern. Baseline: 31ft with R boot and B axillary crutches; 10/31/22 267ft with ASO only using step through pattern Goal status: Met   4.  Increase R ankle strength to 3+/5 Baseline:  MMT Right eval Left eval R 10/19/22  Hip flexion       Hip extension       Hip abduction       Hip adduction       Hip internal rotation       Hip external rotation       Knee flexion       Knee extension       Ankle dorsiflexion 2   3-  Ankle plantarflexion 2+   3-  Ankle inversion 2   3-  Ankle eversion 2   3-    Goal status: Ongoing   5.  Increase R ankle AROM to 10d DF, 30d Inv and 20d everison Baseline:  A/PROM Right eval Left eval R 10/19/22  Hip flexion       Hip extension       Hip abduction       Hip adduction       Hip internal rotation       Hip external rotation       Knee flexion       Knee extension       Ankle dorsiflexion -10/0d   0/8d  Ankle plantarflexion WFL     Ankle inversion 10/22d   25/35d  Ankle eversion 0/8d   20/25d    Goal status: Ongoing       PLAN:   PT FREQUENCY: 2x/week   PT DURATION: 8 weeks   PLANNED INTERVENTIONS: Therapeutic  exercises, Therapeutic activity, Neuromuscular re-education, Balance training, Gait training, Patient/Family education, Self Care, Joint mobilization, Stair training, DME instructions, Aquatic Therapy, Dry Needling, Manual therapy, and Re-evaluation   PLAN FOR NEXT SESSION: HEP review, FOTO assessment, MD f/u update, ROM, proprioception, desensitization tasks, OKC strengthening, gait training    Hildred Laser, PT 11/09/2022, 1:05 PM

## 2022-11-09 ENCOUNTER — Ambulatory Visit: Payer: BC Managed Care – PPO

## 2022-11-09 DIAGNOSIS — M25671 Stiffness of right ankle, not elsewhere classified: Secondary | ICD-10-CM

## 2022-11-09 DIAGNOSIS — M6281 Muscle weakness (generalized): Secondary | ICD-10-CM

## 2022-11-09 DIAGNOSIS — M25571 Pain in right ankle and joints of right foot: Secondary | ICD-10-CM

## 2022-11-12 NOTE — Therapy (Unsigned)
OUTPATIENT PHYSICAL THERAPY TREATMENT NOTE   Patient Name: Brenda Tyler MRN: [id number] DOB:1986-05-29, 37 y.o., female Today's Date: 11/14/2022  PCP: Huel Cote, MD  REFERRING PROVIDER: Edwin Cap, DPM   END OF SESSION:   PT End of Session - 11/14/22 1220     Visit Number 13    Number of Visits 16    Authorization Type BCBS    Authorization Time Period [employer] Secondary 10 visits 10/03/22-01/01/23    PT Start Time 1215             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 - s/p Colpo 11/02/13 - CIN II -->    HIV (human immunodeficiency virus infection) (HCC)    HSV (herpes simplex virus) infection    Obesity    Oropharyngeal candidiasis 07/05/2014   Other fatigue    Shortness of breath on exertion    Past Surgical History:  Procedure Laterality Date   INCISION AND DRAINAGE ABSCESS     on abdomen and buttocks   Patient Active Problem List   Diagnosis Date Noted   Pre-diabetes 11/02/2020   Vitamin D deficiency 11/02/2020   At risk for diabetes mellitus 11/02/2020   Acute intractable headache 02/23/2020   Dizziness and giddiness 02/23/2020   Cyst of bone of left hand 12/10/2019   Visit for routine gyn exam 07/30/2019   Desire for pregnancy 07/30/2019   Dislocation of knee, anterior, left, closed 01/21/2016   Closed anterior dislocation of left knee 01/21/2016   Depression 07/26/2014   Herpes labialis 07/03/2014   HIV disease (HCC) 04/23/2014   General counseling for prescription of oral contraceptives 06/11/2013   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   Obesity 12/22/2012    REFERRING DIAG: M25.371 (ICD-10-CM) - Ankle instability, right   THERAPY DIAG:  Pain in right ankle and joints of right foot - Plan: PT plan of care cert/re-cert  Muscle weakness (generalized)  - Plan: PT plan of care cert/re-cert  Decreased range of motion of right ankle - Plan: PT plan of care cert/re-cert  Rationale for Evaluation and Treatment Rehabilitation  PERTINENT HISTORY: R peroneal tendon repair on 08/10/22  PRECAUTIONS: WB on 09/13/22 - DOS 08/10/2022  SUBJECTIVE:                                                                                                                                                                                      SUBJECTIVE STATEMENT:  Felt  scar mobilization was helpful as she was able to stand to wash dishes with only minimal pain and no need of a rest period   PAIN:  Are you having pain?  Yes: NPRS scale: 8/10 Pain location: R ankle Pain description: ache Aggravating factors: weight bearing Relieving factors: rest elevation   OBJECTIVE: (objective measures completed at initial evaluation unless otherwise dated)  DIAGNOSTIC FINDINGS: none available   PATIENT SURVEYS:  FOTO 09/29/22: 38%(59% predicted); 10/31/22 38%   POSTURE: rounded shoulders and increased thoracic kyphosis   PALPATION: TTP over lateral malleolus and scar   LOWER EXTREMITY ROM:   A/PROM Right eval Left eval R 10/19/22 R 11/14/22  Hip flexion        Hip extension        Hip abduction        Hip adduction        Hip internal rotation        Hip external rotation        Knee flexion        Knee extension        Ankle dorsiflexion -10/0d   0/8d 4/12d  Ankle plantarflexion WFL      Ankle inversion 10/22d   25/35d   Ankle eversion 0/8d   20/25d    (Blank rows = not tested)   LOWER EXTREMITY MMT:   MMT Right eval Left eval R 10/19/22 R  11/14/22  Hip flexion        Hip extension        Hip abduction        Hip adduction        Hip internal rotation        Hip external rotation        Knee flexion        Knee extension        Ankle dorsiflexion 2   3- 4-  Ankle plantarflexion 2+   3- 4-  Ankle inversion 2   3- 4-  Ankle eversion 2   3- 4-    (Blank rows = not tested)   LOWER EXTREMITY SPECIAL TESTS:  Deferred post-op   FUNCTIONAL TESTS:  Deferred post-op   GAIT: Distance walked: 14ftx2 Assistive device utilized: Crutches Level of assistance: Modified independence Comments: step to pattern  TREATMENT: [facility] Adult PT Treatment:                                                DATE: 11/14/22 Therapeutic Exercise: Nustep L6 8 min Runners step to 6 in block from airex pad 15/15 with UE support Rocker board A/P 15x countertop support Rocker board M/L 15x countertop support PF against wall 15x Manual Therapy: STM and scar massage to medial portal site and lateral incision site, skin roll technique emphasized  [facility] Adult PT Treatment:                                                DATE: 11/09/22 Therapeutic Exercise: Nustep L5 8 min Runners step from 4 in block 15/15 with UE support 3 way ankle RTB 15x 2 ea. Manual Therapy: STM and scar massage to medial portal site and lateral incision site  [facility] Adult PT Treatment:  DATE: 11/02/22 Therapeutic Exercise: Nustep L2 8 min Seated PF stretch with towel 30s x3 Seated R rocker board PF/DF 30x 2 DF hold 3 way ankle YTB 15x 2 ea. Stepping onto 8 in block and weight shifting 15x  [facility] Adult PT Treatment:                                                DATE: 10/31/22 Therapeutic Exercise: Nustep L5 8 min Step overs 6 in 5x over and back with countertop support 3 way ankle RTB 15x 2 ea. Countertop heel/toe and backward walking 3 trips Countertop side stepping 3 trips L LE march against wall 15x   [facility] Adult PT Treatment:                                                DATE: 10/24/22 Therapeutic Exercise: Nustep L4 8 min Step ups L 4 in 15x Lateral steps 4 in 15x 3 way ankle YTB 15x 2 ea. Countertop heel/toe and backward walking 4 trips Countertop side stepping 4 trips STM over anterolateral portal site elicited  symptoms    PATIENT EDUCATION:  Education details: N/A Person educated: Patient Education method: Explanation Education comprehension: verbalized understanding and needs further education   HOME EXERCISE PROGRAM: Access Code: [email or web address] URL: [email or web address] Date: 09/18/2022 Prepared by: Gustavus Bryant   Exercises - Long Sitting Plantar Fascia Stretch with Towel  - 3 x daily - 5 x weekly - 1 sets - 2 reps - 30s hold - Towel Scrunches  - 3 x daily - 5 x weekly - 1 sets - 2 reps - 30s hold - Seated Great Toe Extension  - 3 x daily - 5 x weekly - 2 sets - 30 reps - Seated Lesser Toes Extension  - 3 x daily - 5 x weekly - 2 sets - 3 reps - Seated Ankle Alphabet  - 3 x daily - 5 x weekly - 1 sets - 26 reps - Seated Ankle Circles  - 3 x daily - 5 x weekly - 1 sets - 30 reps   ASSESSMENT:   CLINICAL IMPRESSION: Scar and soft tissue mobilization beneficial as activity tolerance improved.  Advanced to more balance and proprioceptive work today to challenge strength and balance.  Added unstable surface as patient performed runners stepping.   Eval: Patient is a 37 y.o. female who was seen today for physical therapy evaluation and treatment for Rankle pain, weakness and ROM deficits following surgical repair of peroneal tendons. She is WBAT in CAM boot using axillary crutches and step to gait.  Pain levels are high and patient is hypersensitive to scar region.  A/PROM and strength decreased in all planes.  Good PT and DP pulses noted, no redness, warmth or color changes observed.  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   OBJECTIVE IMPAIRMENTS: Abnormal gait, decreased activity tolerance, decreased coordination, decreased endurance, decreased knowledge of condition, decreased knowledge of use of DME, decreased mobility, difficulty walking, decreased ROM, decreased strength, increased edema, obesity, and pain.     ACTIVITY LIMITATIONS: carrying, lifting, standing, squatting, stairs, and locomotion level   PERSONAL FACTORS: Fitness, Past/current experiences, Time since onset of  injury/illness/exacerbation, and 1 comorbidity: anxiety  are also affecting patient's functional outcome.    REHAB POTENTIAL: Good   CLINICAL DECISION MAKING: Evolving/moderate complexity   EVALUATION COMPLEXITY: Moderate     GOALS: Goals reviewed with patient? No   SHORT TERM GOALS: Target date: 10/16/2022   Patient to demonstrate independence in HEP  Baseline:[id number] Goal status: Met   2.  Obtain FOTO Baseline: TBD; 09/29/22: 38%; 10/31/22 38% Goal status: Met    3.  Establish a step through gait pattern Baseline: step to using boot/crutches; 10/19/22 Step through pattern w/o need of ADs just ASO brace Goal status: Met     LONG TERM GOALS: Target date: 12/12/2022     Assess FOTO progress Baseline: 38% (59% predicted); 10/31/22 38% Goal status: Ongoing   2.  Decrease pain to 6/10 Baseline: 10/10 Goal status: INITIAL   3.  Ambulate 279ft with LRAD using step through pattern. Baseline: 78ft with R boot and B axillary crutches; 10/31/22 264ft with ASO only using step through pattern Goal status: Met   4.  Increase R ankle strength to 3+/5 Baseline:  MMT Right eval Left eval R 10/19/22 R  11/14/22  Hip flexion        Hip extension        Hip abduction        Hip adduction        Hip internal rotation        Hip external rotation        Knee flexion        Knee extension        Ankle dorsiflexion 2   3- 4-  Ankle plantarflexion 2+   3- 4-  Ankle inversion 2   3- 4-  Ankle eversion 2   3- 4-    Goal status: Ongoing   5.  Increase R ankle AROM to 10d DF, 30d Inv and 20d everison Baseline:  A/PROM Right eval Left eval R 10/19/22 R 11/14/22  Hip flexion        Hip extension        Hip abduction        Hip adduction        Hip internal rotation        Hip external rotation        Knee flexion         Knee extension        Ankle dorsiflexion -10/0d   0/8d 4/12d  Ankle plantarflexion WFL      Ankle inversion 10/22d   25/35d   Ankle eversion 0/8d   20/25d     Goal status: Ongoing       PLAN:   PT FREQUENCY: 2x/week   PT DURATION: 4 weeks   PLANNED INTERVENTIONS: Therapeutic exercises, Therapeutic activity, Neuromuscular re-education, Balance training, Gait training, Patient/Family education, Self Care, Joint mobilization, Stair training, DME instructions, Aquatic Therapy, Dry Needling, Manual therapy, and Re-evaluation   PLAN FOR NEXT SESSION: HEP review, FOTO assessment, MD f/u update, ROM, proprioception, desensitization tasks, OKC strengthening, gait training    Hildred Laser, PT 11/14/2022, 1:12 PM

## 2022-11-14 ENCOUNTER — Ambulatory Visit: Payer: BC Managed Care – PPO

## 2022-11-14 DIAGNOSIS — M6281 Muscle weakness (generalized): Secondary | ICD-10-CM

## 2022-11-14 DIAGNOSIS — M25671 Stiffness of right ankle, not elsewhere classified: Secondary | ICD-10-CM | POA: Diagnosis not present

## 2022-11-14 DIAGNOSIS — M25571 Pain in right ankle and joints of right foot: Secondary | ICD-10-CM

## 2022-11-15 DIAGNOSIS — M79676 Pain in unspecified toe(s): Secondary | ICD-10-CM

## 2022-11-15 NOTE — Therapy (Signed)
OUTPATIENT PHYSICAL THERAPY TREATMENT NOTE   Patient Name: Brenda Tyler MRN: [id number] DOB:11/26/1985, 37 y.o., female Today's Date: 11/16/2022  PCP: Huel Cote, MD  REFERRING PROVIDER: Edwin Cap, DPM   END OF SESSION:   PT End of Session - 11/16/22 1219     Visit Number 14    Number of Visits 16    Date for PT Re-Evaluation 01/14/23    Authorization Type BCBS    Authorization Time Period [employer] Secondary 10 visits 10/03/22-01/01/23    PT Start Time 1220             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 - s/p Colpo 11/02/13 - CIN II -->    HIV (human immunodeficiency virus infection) (HCC)    HSV (herpes simplex virus) infection    Obesity    Oropharyngeal candidiasis 07/05/2014   Other fatigue    Shortness of breath on exertion    Past Surgical History:  Procedure Laterality Date   INCISION AND DRAINAGE ABSCESS     on abdomen and buttocks   Patient Active Problem List   Diagnosis Date Noted   Pre-diabetes 11/02/2020   Vitamin D deficiency 11/02/2020   At risk for diabetes mellitus 11/02/2020   Acute intractable headache 02/23/2020   Dizziness and giddiness 02/23/2020   Cyst of bone of left hand 12/10/2019   Visit for routine gyn exam 07/30/2019   Desire for pregnancy 07/30/2019   Dislocation of knee, anterior, left, closed 01/21/2016   Closed anterior dislocation of left knee 01/21/2016   Depression 07/26/2014   Herpes labialis 07/03/2014   HIV disease (HCC) 04/23/2014   General counseling for prescription of oral contraceptives 06/11/2013   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   Obesity 12/22/2012    REFERRING DIAG: M25.371 (ICD-10-CM) - Ankle instability, right   THERAPY DIAG:  Pain in right ankle and joints of right foot  Muscle weakness  (generalized)  Decreased range of motion of right ankle  Rationale for Evaluation and Treatment Rehabilitation  PERTINENT HISTORY: R peroneal tendon repair on 08/10/22  PRECAUTIONS: WB on 09/13/22 - DOS 08/10/2022  SUBJECTIVE:                                                                                                                                                                                      SUBJECTIVE STATEMENT:  Continues to feel scar mobilization helpful.  Feels well enough to attend baseball game  and holidayactivities outside the home.   PAIN:  Are you having pain?  Yes: NPRS scale: 8/10 Pain location: R ankle Pain description: ache Aggravating factors: weight bearing Relieving factors: rest elevation   OBJECTIVE: (objective measures completed at initial evaluation unless otherwise dated)  DIAGNOSTIC FINDINGS: none available   PATIENT SURVEYS:  FOTO 09/29/22: 38%(59% predicted); 10/31/22 38%   POSTURE: rounded shoulders and increased thoracic kyphosis   PALPATION: TTP over lateral malleolus and scar   LOWER EXTREMITY ROM:   A/PROM Right eval Left eval R 10/19/22 R 11/14/22  Hip flexion        Hip extension        Hip abduction        Hip adduction        Hip internal rotation        Hip external rotation        Knee flexion        Knee extension        Ankle dorsiflexion -10/0d   0/8d 4/12d  Ankle plantarflexion WFL      Ankle inversion 10/22d   25/35d   Ankle eversion 0/8d   20/25d    (Blank rows = not tested)   LOWER EXTREMITY MMT:   MMT Right eval Left eval R 10/19/22 R  11/14/22  Hip flexion        Hip extension        Hip abduction        Hip adduction        Hip internal rotation        Hip external rotation        Knee flexion        Knee extension        Ankle dorsiflexion 2   3- 4-  Ankle plantarflexion 2+   3- 4-  Ankle inversion 2   3- 4-  Ankle eversion 2   3- 4-   (Blank rows = not tested)   LOWER EXTREMITY SPECIAL  TESTS:  Deferred post-op   FUNCTIONAL TESTS:  Deferred post-op   GAIT: Distance walked: 62ftx2 Assistive device utilized: Crutches Level of assistance: Modified independence Comments: step to pattern  TREATMENT: [facility] Adult PT Treatment:                                                DATE: 11/16/22 Therapeutic Exercise: Nustep L6 8 min Step ups onto airex on 4 in block R 15x Lateral Step ups onto airex on 4 in block R 15x Statis stance on rocker board 60s x2 A/P only PF at counter 15x DF at counter 15x Manual Therapy: STM and scar massage to medial portal site and lateral incision site, skin roll technique emphasized  [facility] Adult PT Treatment:                                                DATE: 11/14/22 Therapeutic Exercise: Nustep L6 8 min Runners step to 6 in block from airex pad 15/15 with UE support Rocker board A/P 15x countertop support Rocker board M/L 15x countertop support PF against wall 15x Manual Therapy: STM and scar massage to medial portal site and lateral incision site, skin roll technique emphasized  [facility]  Adult PT Treatment:                                                DATE: 11/09/22 Therapeutic Exercise: Nustep L5 8 min Runners step from 4 in block 15/15 with UE support 3 way ankle RTB 15x 2 ea. Manual Therapy: STM and scar massage to medial portal site and lateral incision site  [facility] Adult PT Treatment:                                                DATE: 11/02/22 Therapeutic Exercise: Nustep L2 8 min Seated PF stretch with towel 30s x3 Seated R rocker board PF/DF 30x 2 DF hold 3 way ankle YTB 15x 2 ea. Stepping onto 8 in block and weight shifting 15x  PATIENT EDUCATION:  Education details: N/A Person educated: Patient Education method: Explanation Education comprehension: verbalized understanding and needs further education   HOME EXERCISE PROGRAM: Access Code: [email or web address] URL: [email or web address] Date: 09/18/2022 Prepared by:  Gustavus Bryant   Exercises - Long Sitting Plantar Fascia Stretch with Towel  - 3 x daily - 5 x weekly - 1 sets - 2 reps - 30s hold - Towel Scrunches  - 3 x daily - 5 x weekly - 1 sets - 2 reps - 30s hold - Seated Great Toe Extension  - 3 x daily - 5 x weekly - 2 sets - 30 reps - Seated Lesser Toes Extension  - 3 x daily - 5 x weekly - 2 sets - 3 reps - Seated Ankle Alphabet  - 3 x daily - 5 x weekly - 1 sets - 26 reps - Seated Ankle Circles  - 3 x daily - 5 x weekly - 1 sets - 30 reps   ASSESSMENT:   CLINICAL IMPRESSION: Continues to respond positively to scar mobilization as pain and function have increased.  Added additional tasks to challenge and improve static and dynamic balance.  Incorporated compliant surfaces into stepping tasks.  Able to complete all activities and challenges w/o LOB or symptom increase.    Eval: Patient is a 37 y.o. female who was seen today for physical therapy evaluation and treatment for Rankle pain, weakness and ROM deficits following surgical repair of peroneal tendons. She is WBAT in CAM boot using axillary crutches and step to gait.  Pain levels are high and patient is hypersensitive to scar region.  A/PROM and strength decreased in all planes.  Good PT and DP pulses noted, no redness, warmth or color changes observed.  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   OBJECTIVE IMPAIRMENTS: Abnormal gait, decreased activity tolerance, decreased coordination, decreased endurance, decreased knowledge of condition, decreased knowledge of use of DME, decreased mobility, difficulty walking, decreased ROM, decreased strength, increased edema, obesity, and pain.    ACTIVITY LIMITATIONS: carrying, lifting, standing, squatting, stairs, and locomotion level   PERSONAL FACTORS: Fitness, Past/current experiences, Time since onset of injury/illness/exacerbation, and 1 comorbidity: anxiety  are also affecting patient's  functional outcome.    REHAB POTENTIAL: Good   CLINICAL DECISION MAKING: Evolving/moderate complexity   EVALUATION COMPLEXITY: Moderate     GOALS: Goals reviewed with patient? No  SHORT TERM GOALS: Target date: 10/16/2022   Patient to demonstrate independence in HEP  Baseline:[id number] Goal status: Met   2.  Obtain FOTO Baseline: TBD; 09/29/22: 38%; 10/31/22 38% Goal status: Met    3.  Establish a step through gait pattern Baseline: step to using boot/crutches; 10/19/22 Step through pattern w/o need of ADs just ASO brace Goal status: Met     LONG TERM GOALS: Target date: 12/12/2022     Assess FOTO progress Baseline: 38% (59% predicted); 10/31/22 38% Goal status: Ongoing   2.  Decrease pain to 6/10 Baseline: 10/10 Goal status: INITIAL   3.  Ambulate 241ft with LRAD using step through pattern. Baseline: 27ft with R boot and B axillary crutches; 10/31/22 242ft with ASO only using step through pattern Goal status: Met   4.  Increase R ankle strength to 3+/5 Baseline:  MMT Right eval Left eval R 10/19/22 R  11/14/22  Hip flexion        Hip extension        Hip abduction        Hip adduction        Hip internal rotation        Hip external rotation        Knee flexion        Knee extension        Ankle dorsiflexion 2   3- 4-  Ankle plantarflexion 2+   3- 4-  Ankle inversion 2   3- 4-  Ankle eversion 2   3- 4-    Goal status: Ongoing   5.  Increase R ankle AROM to 10d DF, 30d Inv and 20d everison Baseline:  A/PROM Right eval Left eval R 10/19/22 R 11/14/22  Hip flexion        Hip extension        Hip abduction        Hip adduction        Hip internal rotation        Hip external rotation        Knee flexion        Knee extension        Ankle dorsiflexion -10/0d   0/8d 4/12d  Ankle plantarflexion WFL      Ankle inversion 10/22d   25/35d   Ankle eversion 0/8d   20/25d     Goal status: Ongoing       PLAN:   PT FREQUENCY: 2x/week   PT DURATION: 4  weeks   PLANNED INTERVENTIONS: Therapeutic exercises, Therapeutic activity, Neuromuscular re-education, Balance training, Gait training, Patient/Family education, Self Care, Joint mobilization, Stair training, DME instructions, Aquatic Therapy, Dry Needling, Manual therapy, and Re-evaluation   PLAN FOR NEXT SESSION: HEP review, FOTO assessment, MD f/u update, ROM, proprioception, desensitization tasks, OKC strengthening, gait training    Hildred Laser, PT 11/16/2022, 12:58 PM

## 2022-11-16 ENCOUNTER — Ambulatory Visit: Payer: BC Managed Care – PPO

## 2022-11-16 DIAGNOSIS — M25571 Pain in right ankle and joints of right foot: Secondary | ICD-10-CM

## 2022-11-16 DIAGNOSIS — M25671 Stiffness of right ankle, not elsewhere classified: Secondary | ICD-10-CM | POA: Diagnosis not present

## 2022-11-16 DIAGNOSIS — M6281 Muscle weakness (generalized): Secondary | ICD-10-CM | POA: Diagnosis not present

## 2022-11-29 ENCOUNTER — Ambulatory Visit (INDEPENDENT_AMBULATORY_CARE_PROVIDER_SITE_OTHER): Payer: BC Managed Care – PPO

## 2022-11-29 ENCOUNTER — Ambulatory Visit (INDEPENDENT_AMBULATORY_CARE_PROVIDER_SITE_OTHER): Payer: BC Managed Care – PPO | Admitting: Podiatry

## 2022-11-29 DIAGNOSIS — M25871 Other specified joint disorders, right ankle and foot: Secondary | ICD-10-CM

## 2022-11-29 DIAGNOSIS — Z9889 Other specified postprocedural states: Secondary | ICD-10-CM

## 2022-11-29 DIAGNOSIS — S86311S Strain of muscle(s) and tendon(s) of peroneal muscle group at lower leg level, right leg, sequela: Secondary | ICD-10-CM | POA: Diagnosis not present

## 2022-11-29 MED ORDER — HYDROCODONE-ACETAMINOPHEN 5-325 MG PO TABS: 1.0000 | ORAL_TABLET | Freq: Three times a day (TID) | ORAL | 0 refills | Status: AC | PRN

## 2022-12-03 NOTE — Progress Notes (Signed)
  Subjective:  Patient ID: Brenda Tyler, female    DOB: 23-Jan-1986,  MRN: [id number]  Chief Complaint  Patient presents with   Routine Post Op    for follow up ankle pain after surgery, MRI review, injection f/u . POV#5 DOS 02.16.24 ANKLE ARTHROSCOPY & ANKLE STABILIZATION RT (new ankle x rays)        37 y.o. female returns for post-op check.   She still doing therapy feels like it is starting to improve a little bit.  The injection did help some.  Review of Systems: Negative except as noted in the HPI. Denies N/V/F/Ch.   Objective:  There were no vitals filed for this visit. There is no height or weight on file to calculate BMI. Constitutional Well developed. Well nourished.  Vascular Foot warm and well perfused. Capillary refill normal to all digits.  Calf is soft and supple, no posterior calf or knee pain, negative Homans' sign  Neurologic Normal speech. Oriented to person, place, and time. Epicritic sensation to light touch grossly present bilaterally.  No allodynia or signs of CRPS type I or II  Dermatologic Scar is much less painful amount of hypertrophy  Orthopedic: Good stability of the ankle joint and good range of motion, 5 out of 5 strength with plantarflexion and eversion   Multiple view plain film radiographs: New films taken today show unchanged alignment and no new osseous abnormalities  MRI 11/07/2022 showed no new abnormalities in the area of repair with improvement in prior bony edema Assessment:   1. Tear of peroneal tendon, right, sequela   2. Impingement syndrome of right ankle   3. Status post right foot surgery    Plan:  Patient was evaluated and treated and all questions answered.  S/p foot surgery right -Has had quite a bit of improvement.  I recommend she continue to maximize and push the limit on her therapy and recovery process for work hardening.  Her repair is strong and her MRI shows no new abnormalities in the areas that are visible of the  repair are holding strong.  Continue WBAT in regular shoe gear.  Her gait has improved today on examination.  Did refill her pain medication and request to use only as needed.  Return in late July prior to returning to work for reevaluation.  Return in about 7 weeks (around 01/17/2023) for f/u from ankle stabilization .

## 2022-12-07 NOTE — Therapy (Signed)
OUTPATIENT PHYSICAL THERAPY TREATMENT NOTE   Patient Name: ANTORIA WISEHART MRN: [id number] DOB:08/27/1985, 37 y.o., female Today's Date: 12/08/2022  PCP: Huel Cote, MD  REFERRING PROVIDER: Edwin Cap, DPM   END OF SESSION:   PT End of Session - 12/08/22 1119     Visit Number 15    Number of Visits 16    Date for PT Re-Evaluation 01/14/23    Authorization Type BCBS    Authorization Time Period [employer] Secondary 10 visits 10/03/22-01/01/23    Authorization - Visit Number 4    Authorization - Number of Visits 10    PT Start Time 1119    PT Stop Time 1159    PT Time Calculation (min) 40 min    Activity Tolerance Patient tolerated treatment well;Patient limited by pain    Behavior During Therapy [facility] for tasks assessed/performed              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 - s/p Colpo 11/02/13 - CIN II -->    HIV (human immunodeficiency virus infection) (HCC)    HSV (herpes simplex virus) infection    Obesity    Oropharyngeal candidiasis 07/05/2014   Other fatigue    Shortness of breath on exertion    Past Surgical History:  Procedure Laterality Date   INCISION AND DRAINAGE ABSCESS     on abdomen and buttocks   Patient Active Problem List   Diagnosis Date Noted   Pre-diabetes 11/02/2020   Vitamin D deficiency 11/02/2020   At risk for diabetes mellitus 11/02/2020   Acute intractable headache 02/23/2020   Dizziness and giddiness 02/23/2020   Cyst of bone of left hand 12/10/2019   Visit for routine gyn exam 07/30/2019   Desire for pregnancy 07/30/2019   Dislocation of knee, anterior, left, closed 01/21/2016   Closed anterior dislocation of left knee 01/21/2016   Depression 07/26/2014   Herpes labialis 07/03/2014   HIV disease (HCC) 04/23/2014   General  counseling for prescription of oral contraceptives 06/11/2013   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   Obesity 12/22/2012    REFERRING DIAG: M25.371 (ICD-10-CM) - Ankle instability, right   THERAPY DIAG:  Pain in right ankle and joints of right foot  Muscle weakness (generalized)  Decreased range of motion of right ankle  Stiffness of right ankle, not elsewhere classified  Difficulty in walking, not elsewhere classified  Rationale for Evaluation and Treatment Rehabilitation  PERTINENT HISTORY: R peroneal tendon repair on 08/10/22  PRECAUTIONS: WB on 09/13/22 - DOS 08/10/2022  SUBJECTIVE:  SUBJECTIVE STATEMENT: Patient reports that she goes to a local high school to walk a mile twice a week and while she has some pain during she reports no increase in pain the next day. She also states that she has been able to walk around [employer] instead of using the scooter.    PAIN:  Are you having pain?  Yes: NPRS scale: 3/10 Pain location: R ankle Pain description: ache Aggravating factors: weight bearing Relieving factors: rest elevation   OBJECTIVE: (objective measures completed at initial evaluation unless otherwise dated)  DIAGNOSTIC FINDINGS: none available   PATIENT SURVEYS:  FOTO 09/29/22: 38%(59% predicted); 10/31/22 38%   POSTURE: rounded shoulders and increased thoracic kyphosis   PALPATION: TTP over lateral malleolus and scar   LOWER EXTREMITY ROM:   A/PROM Right eval Left eval R 10/19/22 R 11/14/22  Hip flexion        Hip extension        Hip abduction        Hip adduction        Hip internal rotation        Hip external rotation        Knee flexion        Knee extension        Ankle dorsiflexion -10/0d   0/8d 4/12d  Ankle plantarflexion WFL      Ankle inversion 10/22d   25/35d   Ankle  eversion 0/8d   20/25d    (Blank rows = not tested)   LOWER EXTREMITY MMT:   MMT Right eval Left eval R 10/19/22 R  11/14/22  Hip flexion        Hip extension        Hip abduction        Hip adduction        Hip internal rotation        Hip external rotation        Knee flexion        Knee extension        Ankle dorsiflexion 2   3- 4-  Ankle plantarflexion 2+   3- 4-  Ankle inversion 2   3- 4-  Ankle eversion 2   3- 4-   (Blank rows = not tested)   LOWER EXTREMITY SPECIAL TESTS:  Deferred post-op   FUNCTIONAL TESTS:  Deferred post-op   GAIT: Distance walked: 67ftx2 Assistive device utilized: Crutches Level of assistance: Modified independence Comments: step to pattern  TREATMENT: [facility] Adult PT Treatment:                                                DATE: 12/08/22 Therapeutic Exercise: Nustep L6 8 min Runners step to 6 in block from airex pad x 15 Rt, 4" x 15 Lt with UE support Step ups onto airex on 4 in block R 15x Lateral Step ups onto airex on 4 in block R 15x Static stance on rocker board 60s x2 A/P only PF at counter 15x DF at counter 15x   [facility] Adult PT Treatment:                                                DATE: 11/16/22 Therapeutic Exercise: Nustep L6  8 min Step ups onto airex on 4 in block R 15x Lateral Step ups onto airex on 4 in block R 15x Statis stance on rocker board 60s x2 A/P only PF at counter 15x DF at counter 15x Manual Therapy: STM and scar massage to medial portal site and lateral incision site, skin roll technique emphasized  [facility] Adult PT Treatment:                                                DATE: 11/14/22 Therapeutic Exercise: Nustep L6 8 min Runners step to 6 in block from airex pad 15/15 with UE support Rocker board A/P 15x countertop support Rocker board M/L 15x countertop support PF against wall 15x Manual Therapy: STM and scar massage to medial portal site and lateral incision site, skin roll technique  emphasized  [facility] Adult PT Treatment:                                                DATE: 11/09/22 Therapeutic Exercise: Nustep L5 8 min Runners step from 4 in block 15/15 with UE support 3 way ankle RTB 15x 2 ea. Manual Therapy: STM and scar massage to medial portal site and lateral incision site   PATIENT EDUCATION:  Education details: N/A Person educated: Patient Education method: Explanation Education comprehension: verbalized understanding and needs further education   HOME EXERCISE PROGRAM: Access Code: [email or web address] URL: [email or web address] Date: 09/18/2022 Prepared by: Gustavus Bryant   Exercises - Long Sitting Plantar Fascia Stretch with Towel  - 3 x daily - 5 x weekly - 1 sets - 2 reps - 30s hold - Towel Scrunches  - 3 x daily - 5 x weekly - 1 sets - 2 reps - 30s hold - Seated Great Toe Extension  - 3 x daily - 5 x weekly - 2 sets - 30 reps - Seated Lesser Toes Extension  - 3 x daily - 5 x weekly - 2 sets - 3 reps - Seated Ankle Alphabet  - 3 x daily - 5 x weekly - 1 sets - 26 reps - Seated Ankle Circles  - 3 x daily - 5 x weekly - 1 sets - 30 reps   ASSESSMENT:   CLINICAL IMPRESSION: Patient presents to PT reporting that her pain and function are improving. She has been walking a mile around a track 2x a week and has been able to walk around grocery stores without utilizing store scooter. Session today focused on increasing standing activity tolerance and distal LE strengthening. Patient was able to tolerate all prescribed exercises with no adverse effects. Patient continues to benefit from skilled PT services and should be progressed as able to improve functional independence.   Eval: Patient is a 37 y.o. female who was seen today for physical therapy evaluation and treatment for Rankle pain, weakness and ROM deficits following surgical repair of peroneal tendons. She is WBAT in CAM boot using axillary crutches and step to gait.  Pain levels are high and  patient is hypersensitive to scar region.  A/PROM and strength decreased in all planes.  Good PT and DP pulses noted, no redness, warmth or color changes observed.  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   OBJECTIVE IMPAIRMENTS: Abnormal gait, decreased activity tolerance, decreased coordination, decreased endurance, decreased knowledge of condition, decreased knowledge of use of DME, decreased mobility, difficulty walking, decreased ROM, decreased strength, increased edema, obesity, and pain.    ACTIVITY LIMITATIONS: carrying, lifting, standing, squatting, stairs, and locomotion level   PERSONAL FACTORS: Fitness, Past/current experiences, Time since onset of injury/illness/exacerbation, and 1 comorbidity: anxiety  are also affecting patient's functional outcome.    REHAB POTENTIAL: Good   CLINICAL DECISION MAKING: Evolving/moderate complexity   EVALUATION COMPLEXITY: Moderate     GOALS: Goals reviewed with patient? No   SHORT TERM GOALS: Target date: 10/16/2022   Patient to demonstrate independence in HEP  Baseline:[id number] Goal status: Met   2.  Obtain FOTO Baseline: TBD; 09/29/22: 38%; 10/31/22 38% Goal status: Met    3.  Establish a step through gait pattern Baseline: step to using boot/crutches; 10/19/22 Step through pattern w/o need of ADs just ASO brace Goal status: Met     LONG TERM GOALS: Target date: 12/12/2022     Assess FOTO progress Baseline: 38% (59% predicted); 10/31/22 38% Goal status: Ongoing   2.  Decrease pain to 6/10 Baseline: 10/10 Goal status: INITIAL   3.  Ambulate 290ft with LRAD using step through pattern. Baseline: 44ft with R boot and B axillary crutches; 10/31/22 232ft with ASO only using step through pattern Goal status: Met   4.  Increase R ankle strength to 3+/5 Baseline:  MMT Right eval Left eval R 10/19/22 R  11/14/22  Hip flexion        Hip extension        Hip abduction         Hip adduction        Hip internal rotation        Hip external rotation        Knee flexion        Knee extension        Ankle dorsiflexion 2   3- 4-  Ankle plantarflexion 2+   3- 4-  Ankle inversion 2   3- 4-  Ankle eversion 2   3- 4-    Goal status: Ongoing   5.  Increase R ankle AROM to 10d DF, 30d Inv and 20d everison Baseline:  A/PROM Right eval Left eval R 10/19/22 R 11/14/22  Hip flexion        Hip extension        Hip abduction        Hip adduction        Hip internal rotation        Hip external rotation        Knee flexion        Knee extension        Ankle dorsiflexion -10/0d   0/8d 4/12d  Ankle plantarflexion WFL      Ankle inversion 10/22d   25/35d   Ankle eversion 0/8d   20/25d     Goal status: Ongoing       PLAN:   PT FREQUENCY: 2x/week   PT DURATION: 4 weeks   PLANNED INTERVENTIONS: Therapeutic exercises, Therapeutic activity, Neuromuscular re-education, Balance training, Gait training, Patient/Family education, Self Care, Joint mobilization, Stair training, DME instructions, Aquatic Therapy, Dry Needling, Manual therapy, and Re-evaluation   PLAN FOR NEXT SESSION: HEP review, FOTO assessment, MD f/u update, ROM, proprioception, desensitization tasks, OKC strengthening, gait training    Berta Minor, PTA 12/08/2022, 11:55 AM

## 2022-12-08 ENCOUNTER — Ambulatory Visit: Payer: BC Managed Care – PPO | Attending: Podiatry

## 2022-12-08 DIAGNOSIS — M25671 Stiffness of right ankle, not elsewhere classified: Secondary | ICD-10-CM | POA: Insufficient documentation

## 2022-12-08 DIAGNOSIS — M6281 Muscle weakness (generalized): Secondary | ICD-10-CM | POA: Diagnosis not present

## 2022-12-08 DIAGNOSIS — R262 Difficulty in walking, not elsewhere classified: Secondary | ICD-10-CM | POA: Insufficient documentation

## 2022-12-08 DIAGNOSIS — M25571 Pain in right ankle and joints of right foot: Secondary | ICD-10-CM | POA: Diagnosis not present

## 2022-12-11 NOTE — Therapy (Unsigned)
OUTPATIENT PHYSICAL THERAPY TREATMENT NOTE   Patient Name: Brenda Tyler MRN: [id number] DOB:30-Apr-1986, 37 y.o., female Today's Date: 12/12/2022  PCP: Huel Cote, MD  REFERRING PROVIDER: Edwin Cap, DPM   END OF SESSION:   PT End of Session - 12/12/22 1221     Visit Number 16    Number of Visits 22    Date for PT Re-Evaluation 01/14/23    Authorization Type BCBS    Authorization Time Period [employer] Secondary 10 visits 10/03/22-01/01/23    Authorization - Number of Visits 10    PT Start Time 1220    PT Stop Time 1300    PT Time Calculation (min) 40 min    Activity Tolerance Patient tolerated treatment well;Patient limited by pain    Behavior During Therapy [facility] for tasks assessed/performed               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 - s/p Colpo 11/02/13 - CIN II -->    HIV (human immunodeficiency virus infection) (HCC)    HSV (herpes simplex virus) infection    Obesity    Oropharyngeal candidiasis 07/05/2014   Other fatigue    Shortness of breath on exertion    Past Surgical History:  Procedure Laterality Date   INCISION AND DRAINAGE ABSCESS     on abdomen and buttocks   Patient Active Problem List   Diagnosis Date Noted   Pre-diabetes 11/02/2020   Vitamin D deficiency 11/02/2020   At risk for diabetes mellitus 11/02/2020   Acute intractable headache 02/23/2020   Dizziness and giddiness 02/23/2020   Cyst of bone of left hand 12/10/2019   Visit for routine gyn exam 07/30/2019   Desire for pregnancy 07/30/2019   Dislocation of knee, anterior, left, closed 01/21/2016   Closed anterior dislocation of left knee 01/21/2016   Depression 07/26/2014   Herpes labialis 07/03/2014   HIV disease (HCC) 04/23/2014   General counseling for prescription of oral  contraceptives 06/11/2013   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   Obesity 12/22/2012    REFERRING DIAG: M25.371 (ICD-10-CM) - Ankle instability, right   THERAPY DIAG:  Pain in right ankle and joints of right foot  Decreased range of motion of right ankle  Muscle weakness (generalized)  Rationale for Evaluation and Treatment Rehabilitation  PERTINENT HISTORY: R peroneal tendon repair on 08/10/22  PRECAUTIONS: WB on 09/13/22 - DOS 08/10/2022  SUBJECTIVE:  SUBJECTIVE STATEMENT: Reports improvement overall still notes some pain in    PAIN:  Are you having pain?  Yes: NPRS scale: 3/10 Pain location: R ankle Pain description: ache Aggravating factors: weight bearing Relieving factors: rest elevation   OBJECTIVE: (objective measures completed at initial evaluation unless otherwise dated)  DIAGNOSTIC FINDINGS: none available   PATIENT SURVEYS:  FOTO 09/29/22: 38%(59% predicted); 10/31/22 38%   POSTURE: rounded shoulders and increased thoracic kyphosis   PALPATION: TTP over lateral malleolus and scar   LOWER EXTREMITY ROM:   A/PROM Right eval Left eval R 10/19/22 R 11/14/22 R 12/12/22  Hip flexion         Hip extension         Hip abduction         Hip adduction         Hip internal rotation         Hip external rotation         Knee flexion         Knee extension         Ankle dorsiflexion -10/0d   0/8d 4/12d 8/14d  Ankle plantarflexion WFL       Ankle inversion 10/22d   25/35d    Ankle eversion 0/8d   20/25d     (Blank rows = not tested)   LOWER EXTREMITY MMT:   MMT Right eval Left eval R 10/19/22 R  11/14/22 R 12/12/22  Hip flexion         Hip extension         Hip abduction         Hip adduction         Hip internal rotation         Hip external rotation         Knee flexion          Knee extension         Ankle dorsiflexion 2   3- 4- 4  Ankle plantarflexion 2+   3- 4- 4-  Ankle inversion 2   3- 4- 4  Ankle eversion 2   3- 4- 4   (Blank rows = not tested)   LOWER EXTREMITY SPECIAL TESTS:  Deferred post-op   FUNCTIONAL TESTS:  Deferred post-op   GAIT: Distance walked: 77ftx2 Assistive device utilized: Crutches Level of assistance: Modified independence Comments: step to pattern  TREATMENT: [facility] Adult PT Treatment:                                                DATE: 12/12/22 Therapeutic Exercise: Nustep L6 8 min Runners step to 6 in block holding 5# KB 15/15 Static stance on rocker board 60s x2 A/P only PF at counter 15x from 4 in block DF at counter 15x Re-Assessment and goal updates   [facility] Adult PT Treatment:                                                DATE: 12/08/22 Therapeutic Exercise: Nustep L6 8 min Runners step to 6 in block from airex pad x 15 Rt, 4" x 15 Lt with UE support Step ups onto airex on 4 in block R 15x Lateral Step ups onto airex on  4 in block R 15x Static stance on rocker board 60s x2 A/P only PF at counter 15x DF at counter 15x   [facility] Adult PT Treatment:                                                DATE: 11/16/22 Therapeutic Exercise: Nustep L6 8 min Step ups onto airex on 4 in block R 15x Lateral Step ups onto airex on 4 in block R 15x Statis stance on rocker board 60s x2 A/P only PF at counter 15x DF at counter 15x Manual Therapy: STM and scar massage to medial portal site and lateral incision site, skin roll technique emphasized  [facility] Adult PT Treatment:                                                DATE: 11/14/22 Therapeutic Exercise: Nustep L6 8 min Runners step to 6 in block from airex pad 15/15 with UE support Rocker board A/P 15x countertop support Rocker board M/L 15x countertop support PF against wall 15x Manual Therapy: STM and scar massage to medial portal site and lateral incision site, skin roll  technique emphasized  [facility] Adult PT Treatment:                                                DATE: 11/09/22 Therapeutic Exercise: Nustep L5 8 min Runners step from 4 in block 15/15 with UE support 3 way ankle RTB 15x 2 ea. Manual Therapy: STM and scar massage to medial portal site and lateral incision site   PATIENT EDUCATION:  Education details: N/A Person educated: Patient Education method: Explanation Education comprehension: verbalized understanding and needs further education   HOME EXERCISE PROGRAM: Access Code: [email or web address] URL: [email or web address] Date: 09/18/2022 Prepared by: Gustavus Bryant   Exercises - Long Sitting Plantar Fascia Stretch with Towel  - 3 x daily - 5 x weekly - 1 sets - 2 reps - 30s hold - Towel Scrunches  - 3 x daily - 5 x weekly - 1 sets - 2 reps - 30s hold - Seated Great Toe Extension  - 3 x daily - 5 x weekly - 2 sets - 30 reps - Seated Lesser Toes Extension  - 3 x daily - 5 x weekly - 2 sets - 3 reps - Seated Ankle Alphabet  - 3 x daily - 5 x weekly - 1 sets - 26 reps - Seated Ankle Circles  - 3 x daily - 5 x weekly - 1 sets - 30 reps   ASSESSMENT:   CLINICAL IMPRESSION: Advanced to more difficult strengthening tasks including heel raises over 4 in block.  Able to tolerate all tasks w/o symptom increase.  LTGs modified to address new targets and reflect progress made to date.  Gains in ROM and strength noted.  Pain levels trending downward.  Eval: Patient is a 37 y.o. female who was seen today for physical therapy evaluation and treatment for Rankle pain, weakness and ROM deficits following surgical repair of peroneal tendons. She is WBAT in CAM boot using axillary crutches and step  to gait.  Pain levels are high and patient is hypersensitive to scar region.  A/PROM and strength decreased in all planes.  Good PT and DP pulses noted, no redness, warmth or color changes observed.  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   OBJECTIVE IMPAIRMENTS: Abnormal gait, decreased activity tolerance, decreased coordination, decreased endurance, decreased knowledge of condition, decreased knowledge of use of DME, decreased mobility, difficulty walking, decreased ROM, decreased strength, increased edema, obesity, and pain.    ACTIVITY LIMITATIONS: carrying, lifting, standing, squatting, stairs, and locomotion level   PERSONAL FACTORS: Fitness, Past/current experiences, Time since onset of injury/illness/exacerbation, and 1 comorbidity: anxiety  are also affecting patient's functional outcome.    REHAB POTENTIAL: Good   CLINICAL DECISION MAKING: Evolving/moderate complexity   EVALUATION COMPLEXITY: Moderate     GOALS: Goals reviewed with patient? No   SHORT TERM GOALS: Target date: 10/16/2022   Patient to demonstrate independence in HEP  Baseline:[id number] Goal status: Met   2.  Obtain FOTO Baseline: TBD; 09/29/22: 38%; 10/31/22 38% Goal status: Met    3.  Establish a step through gait pattern Baseline: step to using boot/crutches; 10/19/22 Step through pattern w/o need of ADs just ASO brace Goal status: Met     LONG TERM GOALS: Target date: 12/12/2022     Assess FOTO progress Baseline: 38% (59% predicted); 10/31/22 38% Goal status: Ongoing   2.  Decrease pain to 6/10 Baseline: 10/10 Goal status: INITIAL   3.  Ambulate 244ft with LRAD using step through pattern. Baseline: 87ft with R boot and B axillary crutches; 10/31/22 253ft with ASO only using step through pattern Goal status: Met   4.  Increase R ankle strength to 4+/5(modified 12/12/22) Baseline:  MMT Right eval Left eval R 10/19/22 R  11/14/22 R 12/12/22  Hip flexion         Hip extension         Hip abduction         Hip adduction         Hip internal rotation         Hip external rotation         Knee flexion         Knee extension         Ankle dorsiflexion 2   3- 4- 4  Ankle plantarflexion 2+    3- 4- 4-  Ankle inversion 2   3- 4- 4  Ankle eversion 2   3- 4- 4    Goal status: Ongoing   5.  Increase R ankle AROM to 10d DF, 30d Inv and 20d everison Baseline:  A/PROM Right eval Left eval R 10/19/22 R 11/14/22 R 12/12/22  Hip flexion         Hip extension         Hip abduction         Hip adduction         Hip internal rotation         Hip external rotation         Knee flexion         Knee extension         Ankle dorsiflexion -10/0d   0/8d 4/12d 8/14d  Ankle plantarflexion WFL       Ankle inversion 10/22d   25/35d    Ankle eversion 0/8d   20/25d      Goal status: Ongoing  PLAN:   PT FREQUENCY: 1x/week   PT DURATION: 6 weeks   PLANNED INTERVENTIONS: Therapeutic exercises, Therapeutic activity, Neuromuscular re-education, Balance training, Gait training, Patient/Family education, Self Care, Joint mobilization, Stair training, DME instructions, Aquatic Therapy, Dry Needling, Manual therapy, and Re-evaluation   PLAN FOR NEXT SESSION: HEP review, FOTO assessment, MD f/u update, ROM, proprioception, desensitization tasks, OKC strengthening, gait training    Hildred Laser, PT 12/12/2022, 1:08 PM  Check all possible CPT codes: 16109 - PT Re-evaluation, 97110- Therapeutic Exercise, [phone]- Neuro Re-education, [id number] - Gait Training, [phone] - Manual Therapy, 97530 - Therapeutic Activities, and 97535 - Self Care    Check all conditions that are expected to impact treatment: {Conditions expected to impact treatment:Morbid obesity   If treatment provided at initial evaluation, no treatment charged due to lack of authorization.

## 2022-12-12 ENCOUNTER — Ambulatory Visit: Payer: BC Managed Care – PPO

## 2022-12-12 DIAGNOSIS — M6281 Muscle weakness (generalized): Secondary | ICD-10-CM

## 2022-12-12 DIAGNOSIS — R262 Difficulty in walking, not elsewhere classified: Secondary | ICD-10-CM | POA: Diagnosis not present

## 2022-12-12 DIAGNOSIS — M25671 Stiffness of right ankle, not elsewhere classified: Secondary | ICD-10-CM | POA: Diagnosis not present

## 2022-12-12 DIAGNOSIS — M25571 Pain in right ankle and joints of right foot: Secondary | ICD-10-CM

## 2022-12-17 NOTE — Therapy (Deleted)
OUTPATIENT PHYSICAL THERAPY TREATMENT NOTE   Patient Name: Brenda Tyler MRN: [id number] DOB:30-Apr-1986, 37 y.o., female Today's Date: 12/12/2022  PCP: Huel Cote, MD  REFERRING PROVIDER: Edwin Cap, DPM   END OF SESSION:   PT End of Session - 12/12/22 1221     Visit Number 16    Number of Visits 22    Date for PT Re-Evaluation 01/14/23    Authorization Type BCBS    Authorization Time Period [employer] Secondary 10 visits 10/03/22-01/01/23    Authorization - Number of Visits 10    PT Start Time 1220    PT Stop Time 1300    PT Time Calculation (min) 40 min    Activity Tolerance Patient tolerated treatment well;Patient limited by pain    Behavior During Therapy [facility] for tasks assessed/performed               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 - s/p Colpo 11/02/13 - CIN II -->    HIV (human immunodeficiency virus infection) (HCC)    HSV (herpes simplex virus) infection    Obesity    Oropharyngeal candidiasis 07/05/2014   Other fatigue    Shortness of breath on exertion    Past Surgical History:  Procedure Laterality Date   INCISION AND DRAINAGE ABSCESS     on abdomen and buttocks   Patient Active Problem List   Diagnosis Date Noted   Pre-diabetes 11/02/2020   Vitamin D deficiency 11/02/2020   At risk for diabetes mellitus 11/02/2020   Acute intractable headache 02/23/2020   Dizziness and giddiness 02/23/2020   Cyst of bone of left hand 12/10/2019   Visit for routine gyn exam 07/30/2019   Desire for pregnancy 07/30/2019   Dislocation of knee, anterior, left, closed 01/21/2016   Closed anterior dislocation of left knee 01/21/2016   Depression 07/26/2014   Herpes labialis 07/03/2014   HIV disease (HCC) 04/23/2014   General counseling for prescription of oral  contraceptives 06/11/2013   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   Obesity 12/22/2012    REFERRING DIAG: M25.371 (ICD-10-CM) - Ankle instability, right   THERAPY DIAG:  Pain in right ankle and joints of right foot  Decreased range of motion of right ankle  Muscle weakness (generalized)  Rationale for Evaluation and Treatment Rehabilitation  PERTINENT HISTORY: R peroneal tendon repair on 08/10/22  PRECAUTIONS: WB on 09/13/22 - DOS 08/10/2022  SUBJECTIVE:  SUBJECTIVE STATEMENT: Reports improvement overall still notes some pain in    PAIN:  Are you having pain?  Yes: NPRS scale: 3/10 Pain location: R ankle Pain description: ache Aggravating factors: weight bearing Relieving factors: rest elevation   OBJECTIVE: (objective measures completed at initial evaluation unless otherwise dated)  DIAGNOSTIC FINDINGS: none available   PATIENT SURVEYS:  FOTO 09/29/22: 38%(59% predicted); 10/31/22 38%   POSTURE: rounded shoulders and increased thoracic kyphosis   PALPATION: TTP over lateral malleolus and scar   LOWER EXTREMITY ROM:   A/PROM Right eval Left eval R 10/19/22 R 11/14/22 R 12/12/22  Hip flexion         Hip extension         Hip abduction         Hip adduction         Hip internal rotation         Hip external rotation         Knee flexion         Knee extension         Ankle dorsiflexion -10/0d   0/8d 4/12d 8/14d  Ankle plantarflexion WFL       Ankle inversion 10/22d   25/35d    Ankle eversion 0/8d   20/25d     (Blank rows = not tested)   LOWER EXTREMITY MMT:   MMT Right eval Left eval R 10/19/22 R  11/14/22 R 12/12/22  Hip flexion         Hip extension         Hip abduction         Hip adduction         Hip internal rotation         Hip external rotation         Knee flexion          Knee extension         Ankle dorsiflexion 2   3- 4- 4  Ankle plantarflexion 2+   3- 4- 4-  Ankle inversion 2   3- 4- 4  Ankle eversion 2   3- 4- 4   (Blank rows = not tested)   LOWER EXTREMITY SPECIAL TESTS:  Deferred post-op   FUNCTIONAL TESTS:  Deferred post-op   GAIT: Distance walked: 77ftx2 Assistive device utilized: Crutches Level of assistance: Modified independence Comments: step to pattern  TREATMENT: [facility] Adult PT Treatment:                                                DATE: 12/12/22 Therapeutic Exercise: Nustep L6 8 min Runners step to 6 in block holding 5# KB 15/15 Static stance on rocker board 60s x2 A/P only PF at counter 15x from 4 in block DF at counter 15x Re-Assessment and goal updates   [facility] Adult PT Treatment:                                                DATE: 12/08/22 Therapeutic Exercise: Nustep L6 8 min Runners step to 6 in block from airex pad x 15 Rt, 4" x 15 Lt with UE support Step ups onto airex on 4 in block R 15x Lateral Step ups onto airex on  4 in block R 15x Static stance on rocker board 60s x2 A/P only PF at counter 15x DF at counter 15x   [facility] Adult PT Treatment:                                                DATE: 11/16/22 Therapeutic Exercise: Nustep L6 8 min Step ups onto airex on 4 in block R 15x Lateral Step ups onto airex on 4 in block R 15x Statis stance on rocker board 60s x2 A/P only PF at counter 15x DF at counter 15x Manual Therapy: STM and scar massage to medial portal site and lateral incision site, skin roll technique emphasized  [facility] Adult PT Treatment:                                                DATE: 11/14/22 Therapeutic Exercise: Nustep L6 8 min Runners step to 6 in block from airex pad 15/15 with UE support Rocker board A/P 15x countertop support Rocker board M/L 15x countertop support PF against wall 15x Manual Therapy: STM and scar massage to medial portal site and lateral incision site, skin roll  technique emphasized  [facility] Adult PT Treatment:                                                DATE: 11/09/22 Therapeutic Exercise: Nustep L5 8 min Runners step from 4 in block 15/15 with UE support 3 way ankle RTB 15x 2 ea. Manual Therapy: STM and scar massage to medial portal site and lateral incision site   PATIENT EDUCATION:  Education details: N/A Person educated: Patient Education method: Explanation Education comprehension: verbalized understanding and needs further education   HOME EXERCISE PROGRAM: Access Code: [email or web address] URL: [email or web address] Date: 09/18/2022 Prepared by: Gustavus Bryant   Exercises - Long Sitting Plantar Fascia Stretch with Towel  - 3 x daily - 5 x weekly - 1 sets - 2 reps - 30s hold - Towel Scrunches  - 3 x daily - 5 x weekly - 1 sets - 2 reps - 30s hold - Seated Great Toe Extension  - 3 x daily - 5 x weekly - 2 sets - 30 reps - Seated Lesser Toes Extension  - 3 x daily - 5 x weekly - 2 sets - 3 reps - Seated Ankle Alphabet  - 3 x daily - 5 x weekly - 1 sets - 26 reps - Seated Ankle Circles  - 3 x daily - 5 x weekly - 1 sets - 30 reps   ASSESSMENT:   CLINICAL IMPRESSION: Advanced to more difficult strengthening tasks including heel raises over 4 in block.  Able to tolerate all tasks w/o symptom increase.  LTGs modified to address new targets and reflect progress made to date.  Gains in ROM and strength noted.  Pain levels trending downward.  Eval: Patient is a 37 y.o. female who was seen today for physical therapy evaluation and treatment for Rankle pain, weakness and ROM deficits following surgical repair of peroneal tendons. She is WBAT in CAM boot using axillary crutches and step  to gait.  Pain levels are high and patient is hypersensitive to scar region.  A/PROM and strength decreased in all planes.  Good PT and DP pulses noted, no redness, warmth or color changes observed.  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   OBJECTIVE IMPAIRMENTS: Abnormal gait, decreased activity tolerance, decreased coordination, decreased endurance, decreased knowledge of condition, decreased knowledge of use of DME, decreased mobility, difficulty walking, decreased ROM, decreased strength, increased edema, obesity, and pain.    ACTIVITY LIMITATIONS: carrying, lifting, standing, squatting, stairs, and locomotion level   PERSONAL FACTORS: Fitness, Past/current experiences, Time since onset of injury/illness/exacerbation, and 1 comorbidity: anxiety  are also affecting patient's functional outcome.    REHAB POTENTIAL: Good   CLINICAL DECISION MAKING: Evolving/moderate complexity   EVALUATION COMPLEXITY: Moderate     GOALS: Goals reviewed with patient? No   SHORT TERM GOALS: Target date: 10/16/2022   Patient to demonstrate independence in HEP  Baseline:[id number] Goal status: Met   2.  Obtain FOTO Baseline: TBD; 09/29/22: 38%; 10/31/22 38% Goal status: Met    3.  Establish a step through gait pattern Baseline: step to using boot/crutches; 10/19/22 Step through pattern w/o need of ADs just ASO brace Goal status: Met     LONG TERM GOALS: Target date: 12/12/2022     Assess FOTO progress Baseline: 38% (59% predicted); 10/31/22 38% Goal status: Ongoing   2.  Decrease pain to 6/10 Baseline: 10/10 Goal status: INITIAL   3.  Ambulate 244ft with LRAD using step through pattern. Baseline: 87ft with R boot and B axillary crutches; 10/31/22 253ft with ASO only using step through pattern Goal status: Met   4.  Increase R ankle strength to 4+/5(modified 12/12/22) Baseline:  MMT Right eval Left eval R 10/19/22 R  11/14/22 R 12/12/22  Hip flexion         Hip extension         Hip abduction         Hip adduction         Hip internal rotation         Hip external rotation         Knee flexion         Knee extension         Ankle dorsiflexion 2   3- 4- 4  Ankle plantarflexion 2+    3- 4- 4-  Ankle inversion 2   3- 4- 4  Ankle eversion 2   3- 4- 4    Goal status: Ongoing   5.  Increase R ankle AROM to 10d DF, 30d Inv and 20d everison Baseline:  A/PROM Right eval Left eval R 10/19/22 R 11/14/22 R 12/12/22  Hip flexion         Hip extension         Hip abduction         Hip adduction         Hip internal rotation         Hip external rotation         Knee flexion         Knee extension         Ankle dorsiflexion -10/0d   0/8d 4/12d 8/14d  Ankle plantarflexion WFL       Ankle inversion 10/22d   25/35d    Ankle eversion 0/8d   20/25d      Goal status: Ongoing  PLAN:   PT FREQUENCY: 1x/week   PT DURATION: 6 weeks   PLANNED INTERVENTIONS: Therapeutic exercises, Therapeutic activity, Neuromuscular re-education, Balance training, Gait training, Patient/Family education, Self Care, Joint mobilization, Stair training, DME instructions, Aquatic Therapy, Dry Needling, Manual therapy, and Re-evaluation   PLAN FOR NEXT SESSION: HEP review, FOTO assessment, MD f/u update, ROM, proprioception, desensitization tasks, OKC strengthening, gait training    Hildred Laser, PT 12/12/2022, 1:08 PM  Check all possible CPT codes: 16109 - PT Re-evaluation, 97110- Therapeutic Exercise, [phone]- Neuro Re-education, [phone] - Gait Training, [phone] - Manual Therapy, 97530 - Therapeutic Activities, and 97535 - Self Care    Check all conditions that are expected to impact treatment: {Conditions expected to impact treatment:Morbid obesity   If treatment provided at initial evaluation, no treatment charged due to lack of authorization.

## 2022-12-19 ENCOUNTER — Telehealth: Payer: Self-pay

## 2022-12-19 ENCOUNTER — Ambulatory Visit: Payer: BC Managed Care – PPO

## 2022-12-19 NOTE — Telephone Encounter (Signed)
TC due to missed visit.  Spoke directly to patient, she forgot appointment.  Reminded of next scheduled visit 12/26/22

## 2022-12-25 NOTE — Therapy (Unsigned)
OUTPATIENT PHYSICAL THERAPY TREATMENT NOTE   Patient Name: Brenda Tyler MRN: [id number] DOB:1986-05-09, 37 y.o., female Today's Date: 12/26/2022  PCP: Huel Cote, MD  REFERRING PROVIDER: Edwin Cap, DPM   END OF SESSION:   PT End of Session - 12/26/22 1219     Visit Number 17    Number of Visits 22    Date for PT Re-Evaluation 01/14/23    Authorization Type BCBS    Authorization Time Period [employer] Secondary 10 visits 10/03/22-01/01/23    PT Start Time 1215    PT Stop Time 1255    PT Time Calculation (min) 40 min    Activity Tolerance Patient tolerated treatment well;Patient limited by pain    Behavior During Therapy [facility] for tasks assessed/performed                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 - s/p Colpo 11/02/13 - CIN II -->    HIV (human immunodeficiency virus infection) (HCC)    HSV (herpes simplex virus) infection    Obesity    Oropharyngeal candidiasis 07/05/2014   Other fatigue    Shortness of breath on exertion    Past Surgical History:  Procedure Laterality Date   INCISION AND DRAINAGE ABSCESS     on abdomen and buttocks   Patient Active Problem List   Diagnosis Date Noted   Pre-diabetes 11/02/2020   Vitamin D deficiency 11/02/2020   At risk for diabetes mellitus 11/02/2020   Acute intractable headache 02/23/2020   Dizziness and giddiness 02/23/2020   Cyst of bone of left hand 12/10/2019   Visit for routine gyn exam 07/30/2019   Desire for pregnancy 07/30/2019   Dislocation of knee, anterior, left, closed 01/21/2016   Closed anterior dislocation of left knee 01/21/2016   Depression 07/26/2014   Herpes labialis 07/03/2014   HIV disease (HCC) 04/23/2014   General counseling for prescription of oral contraceptives 06/11/2013   Secondary  Amenorrhea 12/22/2012   Obesity 12/22/2012    REFERRING DIAG: M25.371 (ICD-10-CM) - Ankle instability, right   THERAPY DIAG:  Pain in right ankle and joints of right foot  Muscle weakness (generalized)  Decreased range of motion of right ankle  Rationale for Evaluation and Treatment Rehabilitation  PERTINENT HISTORY: R peroneal tendon repair on 08/10/22  PRECAUTIONS: WB on 09/13/22 - DOS 08/10/2022  SUBJECTIVE:  SUBJECTIVE STATEMENT: Reports improvement overall still notes some pain in    PAIN:  Are you having pain?  Yes: NPRS scale: 3/10 Pain location: R ankle Pain description: ache Aggravating factors: weight bearing Relieving factors: rest elevation   OBJECTIVE: (objective measures completed at initial evaluation unless otherwise dated)  DIAGNOSTIC FINDINGS: none available   PATIENT SURVEYS:  FOTO 09/29/22: 38%(59% predicted); 10/31/22 38%   POSTURE: rounded shoulders and increased thoracic kyphosis   PALPATION: TTP over lateral malleolus and scar   LOWER EXTREMITY ROM:   A/PROM Right eval Left eval R 10/19/22 R 11/14/22 R 12/12/22  Hip flexion         Hip extension         Hip abduction         Hip adduction         Hip internal rotation         Hip external rotation         Knee flexion         Knee extension         Ankle dorsiflexion -10/0d   0/8d 4/12d 8/14d  Ankle plantarflexion WFL       Ankle inversion 10/22d   25/35d    Ankle eversion 0/8d   20/25d     (Blank rows = not tested)   LOWER EXTREMITY MMT:   MMT Right eval Left eval R 10/19/22 R  11/14/22 R 12/12/22  Hip flexion         Hip extension         Hip abduction         Hip adduction         Hip internal rotation         Hip external rotation         Knee flexion         Knee extension         Ankle  dorsiflexion 2   3- 4- 4  Ankle plantarflexion 2+   3- 4- 4-  Ankle inversion 2   3- 4- 4  Ankle eversion 2   3- 4- 4   (Blank rows = not tested)   LOWER EXTREMITY SPECIAL TESTS:  Deferred post-op   FUNCTIONAL TESTS:  Deferred post-op   GAIT: Distance walked: 4ftx2 Assistive device utilized: Crutches Level of assistance: Modified independence Comments: step to pattern  TREATMENT: [facility] Adult PT Treatment:                                                DATE: 12/26/22 Therapeutic Exercise: Nustep L6 8 min Runners step to 8 in block holding counter 10/10 PF/DF at counter 15x Static stance on airex with alternating trunk rotations and BUE flexion/extension with 2# wt. 10x ea. Manual Therapy: STM and scar massage to medial portal site and lateral incision site, skin roll technique emphasized  [facility] Adult PT Treatment:                                                DATE: 12/12/22 Therapeutic Exercise: Nustep L6 8 min Runners step to 6 in block holding 5# KB 15/15 Static stance on rocker board 60s x2 A/P only PF at counter 15x from  4 in block DF at counter 15x Re-Assessment and goal updates   [facility] Adult PT Treatment:                                                DATE: 12/08/22 Therapeutic Exercise: Nustep L6 8 min Runners step to 6 in block from airex pad x 15 Rt, 4" x 15 Lt with UE support Step ups onto airex on 4 in block R 15x Lateral Step ups onto airex on 4 in block R 15x Static stance on rocker board 60s x2 A/P only PF at counter 15x DF at counter 15x   [facility] Adult PT Treatment:                                                DATE: 11/16/22 Therapeutic Exercise: Nustep L6 8 min Step ups onto airex on 4 in block R 15x Lateral Step ups onto airex on 4 in block R 15x Statis stance on rocker board 60s x2 A/P only PF at counter 15x DF at counter 15x Manual Therapy: STM and scar massage to medial portal site and lateral incision site, skin roll technique emphasized  [facility]  Adult PT Treatment:                                                DATE: 11/14/22 Therapeutic Exercise: Nustep L6 8 min Runners step to 6 in block from airex pad 15/15 with UE support Rocker board A/P 15x countertop support Rocker board M/L 15x countertop support PF against wall 15x Manual Therapy: STM and scar massage to medial portal site and lateral incision site, skin roll technique emphasized  [facility] Adult PT Treatment:                                                DATE: 11/09/22 Therapeutic Exercise: Nustep L5 8 min Runners step from 4 in block 15/15 with UE support 3 way ankle RTB 15x 2 ea. Manual Therapy: STM and scar massage to medial portal site and lateral incision site   PATIENT EDUCATION:  Education details: N/A Person educated: Patient Education method: Explanation Education comprehension: verbalized understanding and needs further education   HOME EXERCISE PROGRAM: Access Code: [email or web address] URL: [email or web address] Date: 09/18/2022 Prepared by: Gustavus Bryant   Exercises - Long Sitting Plantar Fascia Stretch with Towel  - 3 x daily - 5 x weekly - 1 sets - 2 reps - 30s hold - Towel Scrunches  - 3 x daily - 5 x weekly - 1 sets - 2 reps - 30s hold - Seated Great Toe Extension  - 3 x daily - 5 x weekly - 2 sets - 30 reps - Seated Lesser Toes Extension  - 3 x daily - 5 x weekly - 2 sets - 3 reps - Seated Ankle Alphabet  - 3 x daily - 5 x weekly - 1 sets - 26 reps - Seated Ankle Circles  - 3 x  daily - 5 x weekly - 1 sets - 30 reps   ASSESSMENT:   CLINICAL IMPRESSION: Advanced to more difficult strengthening tasks including heel raises over 4 in block.  Able to tolerate all tasks w/o symptom increase.  LTGs modified to address new targets and reflect progress made to date.  Gains in ROM and strength noted.  Pain levels trending downward.  Eval: Patient is a 37 y.o. female who was seen today for physical therapy evaluation and treatment for Rankle pain,  weakness and ROM deficits following surgical repair of peroneal tendons. She is WBAT in CAM boot using axillary crutches and step to gait.  Pain levels are high and patient is hypersensitive to scar region.  A/PROM and strength decreased in all planes.  Good PT and DP pulses noted, no redness, warmth or color changes observed.  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   OBJECTIVE IMPAIRMENTS: Abnormal gait, decreased activity tolerance, decreased coordination, decreased endurance, decreased knowledge of condition, decreased knowledge of use of DME, decreased mobility, difficulty walking, decreased ROM, decreased strength, increased edema, obesity, and pain.    ACTIVITY LIMITATIONS: carrying, lifting, standing, squatting, stairs, and locomotion level   PERSONAL FACTORS: Fitness, Past/current experiences, Time since onset of injury/illness/exacerbation, and 1 comorbidity: anxiety  are also affecting patient's functional outcome.    REHAB POTENTIAL: Good   CLINICAL DECISION MAKING: Evolving/moderate complexity   EVALUATION COMPLEXITY: Moderate     GOALS: Goals reviewed with patient? No   SHORT TERM GOALS: Target date: 10/16/2022   Patient to demonstrate independence in HEP  Baseline:[id number] Goal status: Met   2.  Obtain FOTO Baseline: TBD; 09/29/22: 38%; 10/31/22 38% Goal status: Met    3.  Establish a step through gait pattern Baseline: step to using boot/crutches; 10/19/22 Step through pattern w/o need of ADs just ASO brace Goal status: Met     LONG TERM GOALS: Target date: 12/12/2022     Assess FOTO progress Baseline: 38% (59% predicted); 10/31/22 38% Goal status: Ongoing   2.  Decrease pain to 6/10 Baseline: 10/10 Goal status: INITIAL   3.  Ambulate 27ft with LRAD using step through pattern. Baseline: 74ft with R boot and B axillary crutches; 10/31/22 28ft with ASO only using step through pattern Goal status: Met    4.  Increase R ankle strength to 4+/5(modified 12/12/22) Baseline:  MMT Right eval Left eval R 10/19/22 R  11/14/22 R 12/12/22  Hip flexion         Hip extension         Hip abduction         Hip adduction         Hip internal rotation         Hip external rotation         Knee flexion         Knee extension         Ankle dorsiflexion 2   3- 4- 4  Ankle plantarflexion 2+   3- 4- 4-  Ankle inversion 2   3- 4- 4  Ankle eversion 2   3- 4- 4    Goal status: Ongoing   5.  Increase R ankle AROM to 10d DF, 30d Inv and 20d everison Baseline:  A/PROM Right eval Left eval R 10/19/22 R 11/14/22 R 12/12/22  Hip flexion         Hip extension  Hip abduction         Hip adduction         Hip internal rotation         Hip external rotation         Knee flexion         Knee extension         Ankle dorsiflexion -10/0d   0/8d 4/12d 8/14d  Ankle plantarflexion WFL       Ankle inversion 10/22d   25/35d    Ankle eversion 0/8d   20/25d      Goal status: Ongoing       PLAN:   PT FREQUENCY: 1x/week   PT DURATION: 6 weeks   PLANNED INTERVENTIONS: Therapeutic exercises, Therapeutic activity, Neuromuscular re-education, Balance training, Gait training, Patient/Family education, Self Care, Joint mobilization, Stair training, DME instructions, Aquatic Therapy, Dry Needling, Manual therapy, and Re-evaluation   PLAN FOR NEXT SESSION: HEP review, FOTO assessment, MD f/u update, ROM, proprioception, desensitization tasks, OKC strengthening, gait training    Hildred Laser, PT 12/26/2022, 1:03 PM  Check all possible CPT codes: 40981 - PT Re-evaluation, 97110- Therapeutic Exercise, [phone]- Neuro Re-education, [phone] - Gait Training, [phone] - Manual Therapy, 97530 - Therapeutic Activities, and [phone] - Self Care    Check all conditions that are expected to impact treatment: {Conditions expected to impact treatment:Morbid obesity   If treatment provided at initial evaluation, no treatment  charged due to lack of authorization.

## 2022-12-26 ENCOUNTER — Ambulatory Visit: Payer: 59 | Attending: Podiatry

## 2022-12-26 DIAGNOSIS — M6281 Muscle weakness (generalized): Secondary | ICD-10-CM | POA: Diagnosis not present

## 2022-12-26 DIAGNOSIS — M25671 Stiffness of right ankle, not elsewhere classified: Secondary | ICD-10-CM | POA: Diagnosis not present

## 2022-12-26 DIAGNOSIS — M25571 Pain in right ankle and joints of right foot: Secondary | ICD-10-CM | POA: Diagnosis not present

## 2023-01-14 ENCOUNTER — Encounter: Payer: Self-pay | Admitting: Podiatry

## 2023-01-16 NOTE — Therapy (Unsigned)
OUTPATIENT PHYSICAL THERAPY TREATMENT NOTE   Patient Name: Brenda Tyler MRN: [id number] DOB:03/14/1986, 37 y.o., female Today's Date: 01/17/2023  PCP: Huel Cote, MD  REFERRING PROVIDER: Edwin Cap, DPM   END OF SESSION:   PT End of Session - 01/17/23 1137     Visit Number 18    Number of Visits 22    Date for PT Re-Evaluation 01/14/23    Authorization Type BCBS    Authorization Time Period [employer] Secondary 10 visits 10/03/22-01/01/23    PT Start Time 1135    PT Stop Time 1215    PT Time Calculation (min) 40 min    Activity Tolerance Patient tolerated treatment well;Patient limited by pain    Behavior During Therapy [facility] for tasks assessed/performed                 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 - s/p Colpo 11/02/13 - CIN II -->    HIV (human immunodeficiency virus infection) (HCC)    HSV (herpes simplex virus) infection    Obesity    Oropharyngeal candidiasis 07/05/2014   Other fatigue    Shortness of breath on exertion    Past Surgical History:  Procedure Laterality Date   INCISION AND DRAINAGE ABSCESS     on abdomen and buttocks   Patient Active Problem List   Diagnosis Date Noted   Pre-diabetes 11/02/2020   Vitamin D deficiency 11/02/2020   At risk for diabetes mellitus 11/02/2020   Acute intractable headache 02/23/2020   Dizziness and giddiness 02/23/2020   Cyst of bone of left hand 12/10/2019   Visit for routine gyn exam 07/30/2019   Desire for pregnancy 07/30/2019   Dislocation of knee, anterior, left, closed 01/21/2016   Closed anterior dislocation of left knee 01/21/2016   Depression 07/26/2014   Herpes labialis 07/03/2014   HIV disease (HCC) 04/23/2014   General counseling for prescription of oral contraceptives 06/11/2013   Secondary  Amenorrhea 12/22/2012   Obesity 12/22/2012    REFERRING DIAG: M25.371 (ICD-10-CM) - Ankle instability, right   THERAPY DIAG:  Pain in right ankle and joints of right foot  Muscle weakness (generalized)  Decreased range of motion of right ankle  Rationale for Evaluation and Treatment Rehabilitation  PERTINENT HISTORY: R peroneal tendon repair on 08/10/22  PRECAUTIONS: WB on 09/13/22 - DOS 08/10/2022  SUBJECTIVE:  SUBJECTIVE STATEMENT: Has been trying to reproduce work duties at home but continues to struggle with pain levels.  Pain 10/10 even with brace.  Symptoms onset following 30 min of activity.    PAIN:  Are you having pain?  Yes: NPRS scale: 3/10 Pain location: R ankle Pain description: ache Aggravating factors: weight bearing Relieving factors: rest elevation   OBJECTIVE: (objective measures completed at initial evaluation unless otherwise dated)  DIAGNOSTIC FINDINGS: none available   PATIENT SURVEYS:  FOTO 09/29/22: 38%(59% predicted); 10/31/22 38%   POSTURE: rounded shoulders and increased thoracic kyphosis   PALPATION: TTP over lateral malleolus and scar   LOWER EXTREMITY ROM:   A/PROM Right eval Left eval R 10/19/22 R 11/14/22 R 12/12/22 R 01/17/23  Hip flexion          Hip extension          Hip abduction          Hip adduction          Hip internal rotation          Hip external rotation          Knee flexion          Knee extension          Ankle dorsiflexion -10/0d   0/8d 4/12d 8/14d 10/15d  Ankle plantarflexion WFL        Ankle inversion 10/22d   25/35d   25/30d  Ankle eversion 0/8d   20/25d         20/25d   (Blank rows = not tested)   LOWER EXTREMITY MMT:   MMT Right eval Left eval R 10/19/22 R  11/14/22 R 12/12/22 R 01/17/23  Hip flexion          Hip extension           Hip abduction          Hip adduction          Hip internal rotation          Hip external rotation          Knee flexion          Knee extension          Ankle dorsiflexion 2   3- 4- 4 4+  Ankle plantarflexion 2+   3- 4- 4- 4  Ankle inversion 2   3- 4- 4 4+  Ankle eversion 2   3- 4- 4 4+   (Blank rows = not tested)   LOWER EXTREMITY SPECIAL TESTS:  Deferred post-op   FUNCTIONAL TESTS:  Deferred post-op   GAIT: Distance walked: 21ftx2 Assistive device utilized: Crutches Level of assistance: Modified independence Comments: step to pattern  TREATMENT: [facility] Adult PT Treatment:                                                DATE: 01/17/23 Re-assessment  [facility] Adult PT Treatment:                                                DATE: 12/26/22 Therapeutic Exercise: Nustep L6 8 min Runners step to 8 in block holding counter 10/10 PF/DF at counter 15x Static stance on airex with alternating trunk  rotations and BUE flexion/extension with 2# wt. 10x ea. Manual Therapy: STM and scar massage to medial portal site and lateral incision site, skin roll technique emphasized  [facility] Adult PT Treatment:                                                DATE: 12/12/22 Therapeutic Exercise: Nustep L6 8 min Runners step to 6 in block holding 5# KB 15/15 Static stance on rocker board 60s x2 A/P only PF at counter 15x from 4 in block DF at counter 15x Re-Assessment and goal updates   [facility] Adult PT Treatment:                                                DATE: 12/08/22 Therapeutic Exercise: Nustep L6 8 min Runners step to 6 in block from airex pad x 15 Rt, 4" x 15 Lt with UE support Step ups onto airex on 4 in block R 15x Lateral Step ups onto airex on 4 in block R 15x Static stance on rocker board 60s x2 A/P only PF at counter 15x DF at counter 15x   [facility] Adult PT Treatment:                                                DATE: 11/16/22 Therapeutic Exercise: Nustep L6 8 min Step ups  onto airex on 4 in block R 15x Lateral Step ups onto airex on 4 in block R 15x Statis stance on rocker board 60s x2 A/P only PF at counter 15x DF at counter 15x Manual Therapy: STM and scar massage to medial portal site and lateral incision site, skin roll technique emphasized  [facility] Adult PT Treatment:                                                DATE: 11/14/22 Therapeutic Exercise: Nustep L6 8 min Runners step to 6 in block from airex pad 15/15 with UE support Rocker board A/P 15x countertop support Rocker board M/L 15x countertop support PF against wall 15x Manual Therapy: STM and scar massage to medial portal site and lateral incision site, skin roll technique emphasized  [facility] Adult PT Treatment:                                                DATE: 11/09/22 Therapeutic Exercise: Nustep L5 8 min Runners step from 4 in block 15/15 with UE support 3 way ankle RTB 15x 2 ea. Manual Therapy: STM and scar massage to medial portal site and lateral incision site   PATIENT EDUCATION:  Education details: N/A Person educated: Patient Education method: Explanation Education comprehension: verbalized understanding and needs further education   HOME EXERCISE PROGRAM: Access Code: [email or web address] URL: [email or web address] Date: 09/18/2022 Prepared by: Gustavus Bryant   Exercises - Long Sitting  Plantar Fascia Stretch with Towel  - 3 x daily - 5 x weekly - 1 sets - 2 reps - 30s hold - Towel Scrunches  - 3 x daily - 5 x weekly - 1 sets - 2 reps - 30s hold - Seated Great Toe Extension  - 3 x daily - 5 x weekly - 2 sets - 30 reps - Seated Lesser Toes Extension  - 3 x daily - 5 x weekly - 2 sets - 3 reps - Seated Ankle Alphabet  - 3 x daily - 5 x weekly - 1 sets - 26 reps - Seated Ankle Circles  - 3 x daily - 5 x weekly - 1 sets - 30 reps   ASSESSMENT:   CLINICAL IMPRESSION: Re-assessment only as authorization has expired.  FOTO score remains unchanged, patient has regained  functional AROM in R ankle.  Still reports pain at portal sites despite desensitization techniques.  Strength grades have increased.  SLS 10s B with fingertip support.  Despite gains in ROM and strength, pain levels persist and perceived function is unchanged.  Eval: Patient is a 37 y.o. female who was seen today for physical therapy evaluation and treatment for Rankle pain, weakness and ROM deficits following surgical repair of peroneal tendons. She is WBAT in CAM boot using axillary crutches and step to gait.  Pain levels are high and patient is hypersensitive to scar region.  A/PROM and strength decreased in all planes.  Good PT and DP pulses noted, no redness, warmth or color changes observed.  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   OBJECTIVE IMPAIRMENTS: Abnormal gait, decreased activity tolerance, decreased coordination, decreased endurance, decreased knowledge of condition, decreased knowledge of use of DME, decreased mobility, difficulty walking, decreased ROM, decreased strength, increased edema, obesity, and pain.    ACTIVITY LIMITATIONS: carrying, lifting, standing, squatting, stairs, and locomotion level   PERSONAL FACTORS: Fitness, Past/current experiences, Time since onset of injury/illness/exacerbation, and 1 comorbidity: anxiety  are also affecting patient's functional outcome.    REHAB POTENTIAL: Good   CLINICAL DECISION MAKING: Evolving/moderate complexity   EVALUATION COMPLEXITY: Moderate     GOALS: Goals reviewed with patient? No   SHORT TERM GOALS: Target date: 10/16/2022   Patient to demonstrate independence in HEP  Baseline:[id number] Goal status: Met   2.  Obtain FOTO Baseline: TBD; 09/29/22: 38%; 10/31/22 38% Goal status: Ongoing    3.  Establish a step through gait pattern Baseline: step to using boot/crutches; 10/19/22 Step through pattern w/o need of ADs just ASO brace Goal status: Met     LONG  TERM GOALS: Target date: 12/12/2022     Assess FOTO progress Baseline: 38% (59% predicted); 10/31/22 38%; 01/17/23 38% Goal status: Ongoing   2.  Decrease pain to 6/10 Baseline: 10/10 Goal status: Ongoing   3.  Ambulate 23ft with LRAD using step through pattern. Baseline: 25ft with R boot and B axillary crutches; 10/31/22 221ft with ASO only using step through pattern Goal status: Met   4.  Increase R ankle strength to 4+/5(modified 12/12/22) Baseline:  MMT Right eval Left eval R 10/19/22 R  11/14/22 R 12/12/22 R 01/17/23  Hip flexion          Hip extension          Hip abduction          Hip adduction          Hip internal rotation  Hip external rotation          Knee flexion          Knee extension          Ankle dorsiflexion 2   3- 4- 4 4+  Ankle plantarflexion 2+   3- 4- 4- 4  Ankle inversion 2   3- 4- 4 4+  Ankle eversion 2   3- 4- 4 4+    Goal status: Ongoing   5.  Increase R ankle AROM to 10d DF, 30d Inv and 20d everison Baseline:  A/PROM Right eval Left eval R 10/19/22 R 11/14/22 R 12/12/22 R 01/17/23  Hip flexion          Hip extension          Hip abduction          Hip adduction          Hip internal rotation          Hip external rotation          Knee flexion          Knee extension          Ankle dorsiflexion -10/0d   0/8d 4/12d 8/14d 10/15d  Ankle plantarflexion WFL        Ankle inversion 10/22d   25/35d   25/30d  Ankle eversion 0/8d   20/25d         20/25d    Goal status: Ongoing       PLAN:   PT FREQUENCY: 1x/week   PT DURATION: 4 weeks   PLANNED INTERVENTIONS: Therapeutic exercises, Therapeutic activity, Neuromuscular re-education, Balance training, Gait training, Patient/Family education, Self Care, Joint mobilization, Stair training, DME instructions, Aquatic Therapy, Dry Needling, Manual therapy, and Re-evaluation   PLAN FOR NEXT SESSION: HEP review, FOTO assessment, MD f/u update, ROM, proprioception, desensitization tasks, OKC  strengthening, gait training    Hildred Laser, PT 01/17/2023, 11:56 AM  Check all possible CPT codes: 40981 - PT Re-evaluation, 97110- Therapeutic Exercise, [phone]- Neuro Re-education, [phone] - Gait Training, [phone] - Manual Therapy, 97530 - Therapeutic Activities, and [phone] - Self Care    Check all conditions that are expected to impact treatment: {Conditions expected to impact treatment:Morbid obesity   If treatment provided at initial evaluation, no treatment charged due to lack of authorization.

## 2023-01-17 ENCOUNTER — Ambulatory Visit: Payer: 59

## 2023-01-17 ENCOUNTER — Other Ambulatory Visit: Payer: Self-pay

## 2023-01-17 DIAGNOSIS — M25571 Pain in right ankle and joints of right foot: Secondary | ICD-10-CM | POA: Diagnosis not present

## 2023-01-17 DIAGNOSIS — M6281 Muscle weakness (generalized): Secondary | ICD-10-CM | POA: Diagnosis not present

## 2023-01-17 DIAGNOSIS — M25671 Stiffness of right ankle, not elsewhere classified: Secondary | ICD-10-CM | POA: Diagnosis not present

## 2023-01-17 DIAGNOSIS — B20 Human immunodeficiency virus [HIV] disease: Secondary | ICD-10-CM

## 2023-01-17 MED ORDER — SYMTUZA 800-150-200-10 MG PO TABS
1.0000 | ORAL_TABLET | Freq: Every day | ORAL | 0 refills | Status: DC
Start: 2023-01-17 — End: 2023-01-31

## 2023-01-21 ENCOUNTER — Encounter: Payer: Self-pay | Admitting: Podiatry

## 2023-01-21 ENCOUNTER — Ambulatory Visit: Payer: Medicaid Other | Admitting: Podiatry

## 2023-01-21 DIAGNOSIS — M216X1 Other acquired deformities of right foot: Secondary | ICD-10-CM | POA: Diagnosis not present

## 2023-01-21 DIAGNOSIS — M25371 Other instability, right ankle: Secondary | ICD-10-CM

## 2023-01-21 DIAGNOSIS — M25871 Other specified joint disorders, right ankle and foot: Secondary | ICD-10-CM

## 2023-01-21 DIAGNOSIS — S86311S Strain of muscle(s) and tendon(s) of peroneal muscle group at lower leg level, right leg, sequela: Secondary | ICD-10-CM

## 2023-01-21 NOTE — Progress Notes (Signed)
  Subjective:  Patient ID: Brenda Tyler, female    DOB: Jan 20, 1986,  MRN: [id number]  Chief Complaint  Patient presents with   Routine Post Op    "It still hurts."      37 y.o. female returns for post-op check.  Starting to make some improvement.  She is able to walk more.  Plan to go back to work.  Review of Systems: Negative except as noted in the HPI. Denies N/V/F/Ch.   Objective:  There were no vitals filed for this visit. There is no height or weight on file to calculate BMI. Constitutional Well developed. Well nourished.  Vascular Foot warm and well perfused. Capillary refill normal to all digits.  Calf is soft and supple, no posterior calf or knee pain, negative Homans' sign  Neurologic Normal speech. Oriented to person, place, and time. Epicritic sensation to light touch grossly present bilaterally.  No allodynia or signs of CRPS type I or II  Dermatologic Minimal pain on scar today  Orthopedic: Good stability of the ankle joint and good range of motion, 5 out of 5 strength with plantarflexion and eversion, no pain with resisted range of motion, good stability   Multiple view plain film radiographs:  unchanged alignment and no new osseous abnormalities  MRI 11/07/2022 showed no new abnormalities in the area of repair with improvement in prior bony edema Assessment:   1. Impingement syndrome of right ankle   2. Tear of peroneal tendon, right, sequela   3. Ankle instability, right    Plan:  Patient was evaluated and treated and all questions answered.  S/p foot surgery right -Continues to improve.  I would like her to continue to maximize physical therapy to continue to build strengthening and conditioning.  Return in 6 months for follow-up.  She is released back to work without restrictions.  Return in about 6 months (around 07/24/2023) for follow up right ankle stabilization.

## 2023-01-31 ENCOUNTER — Other Ambulatory Visit: Payer: Self-pay

## 2023-01-31 ENCOUNTER — Encounter: Payer: Self-pay | Admitting: Family

## 2023-01-31 ENCOUNTER — Ambulatory Visit (INDEPENDENT_AMBULATORY_CARE_PROVIDER_SITE_OTHER): Payer: 59 | Admitting: Family

## 2023-01-31 VITALS — BP 131/89 | HR 69 | Temp 97.4°F | Resp 16 | Wt 323.6 lb

## 2023-01-31 DIAGNOSIS — Z0000 Encounter for general adult medical examination without abnormal findings: Secondary | ICD-10-CM | POA: Insufficient documentation

## 2023-01-31 DIAGNOSIS — Z113 Encounter for screening for infections with a predominantly sexual mode of transmission: Secondary | ICD-10-CM

## 2023-01-31 DIAGNOSIS — B20 Human immunodeficiency virus [HIV] disease: Secondary | ICD-10-CM | POA: Diagnosis not present

## 2023-01-31 DIAGNOSIS — N644 Mastodynia: Secondary | ICD-10-CM | POA: Diagnosis not present

## 2023-01-31 LAB — COMPLETE METABOLIC PANEL WITH GFR
AG Ratio: 1.2 (calc) (ref 1.0–2.5)
ALT: 11 U/L (ref 6–29)
AST: 12 U/L (ref 10–30)
Albumin: 4.1 g/dL (ref 3.6–5.1)
Alkaline phosphatase (APISO): 130 U/L — ABNORMAL HIGH (ref 31–125)
BUN: 14 mg/dL (ref 7–25)
CO2: 28 mmol/L (ref 20–32)
Calcium: 9.5 mg/dL (ref 8.6–10.2)
Chloride: 106 mmol/L (ref 98–110)
Creat: 0.87 mg/dL (ref 0.50–0.97)
Globulin: 3.4 g/dL (calc) (ref 1.9–3.7)
Glucose, Bld: 91 mg/dL (ref 65–99)
Potassium: 4.1 mmol/L (ref 3.5–5.3)
Sodium: 141 mmol/L (ref 135–146)
Total Bilirubin: 0.3 mg/dL (ref 0.2–1.2)
Total Protein: 7.5 g/dL (ref 6.1–8.1)
eGFR: 87 mL/min/{1.73_m2} (ref >=60)

## 2023-01-31 MED ORDER — SYMTUZA 800-150-200-10 MG PO TABS
1.0000 | ORAL_TABLET | Freq: Every day | ORAL | 3 refills | Status: DC
Start: 2023-01-31 — End: 2023-06-25

## 2023-01-31 NOTE — Assessment & Plan Note (Signed)
Discussed importance of safe sexual practice and condom use. Condoms and STD testing offered.  Vaccines reviewed and declined.  Encouraged routine dental care and will schedule independently or can refer to [facility] if unable to do so independently.  Cervical cancer screening up to date.

## 2023-01-31 NOTE — Assessment & Plan Note (Signed)
Brenda Tyler appears to have adequately controlled virus however refill history is not very consistent. Discussed importance of taking medications regularly to reduce risk of disease progression and complications in the future. Reviewed U equals U and HIV transmission. Check lab work. Continue current dose of Symtuza. Reminded of importance of routine follow up which we will plan for 3 months or sooner if needed with lab work on the same day.

## 2023-01-31 NOTE — Progress Notes (Signed)
Brief Narrative   Patient ID: Brenda Tyler, female    DOB: 1985-06-28, 37 y.o.   MRN: [id number]  Ms. Kipper is a 37 y/o AA female diagnosed with HIV disease in October 2015 with risk factor of heterosexual contact. Initial viral load was 34,970 with CD4 count 190. Entered care at [facility] Stage 3. Genotype with K103N (R - efavirenz, nevirapine). No previous history of opportunistic infection. [id number] and Quantiferon Gold negative. ART experienced with Stribild, Leroy Kennedy and currently Symtuza.   Subjective:    Chief Complaint  Patient presents with   Follow-up    B20     HPI:  Brenda Tyler is a 37 y.o. female with HIV disease last seen by Dr. Orvan Falconer on 01/25/22 with well controlled virus and good adherence and tolerance to Symtuza. Viral load was undetectable and CD4 count 485. Requested to follow up in 1 month which did not occur. Presents today for follow up.  Ms. Tapp has been doing okay since her last office visit and continues to take her medication although sometimes forgets and may go 2-3 days because of her busy schedule. Keeps the medication concealed as her children are not aware of her diagnosis. Current partner is aware of diagnosis and she has questions about condom use and virus transmission. Has fatigue and tiredness with headaches on occasion. Working full time. Concern for ongoing breast achy, and occasionally sharp, bilateral breast pain. Previously scheduled for mammogram however did not attend that appointment. Current covered by Medicaid. Condoms and STD testing offered.  Declines vaccines.   Denies fevers, chills, night sweats, changes in vision, neck pain/stiffness, nausea, diarrhea, vomiting, lesions or rashes.  Allergies  Allergen Reactions   Dilaudid [Hydromorphone Hcl] Other (See Comments)    Makes her dizzy      Outpatient Medications Prior to Visit  Medication Sig Dispense Refill   gabapentin (NEURONTIN) 300 MG capsule TAKE 1 CAPSULE(300 MG) BY MOUTH  THREE TIMES DAILY FOR 7 DAYS 21 capsule 0   Darunavir-Cobicistat-Emtricitabine-Tenofovir Alafenamide (SYMTUZA) 800-150-200-10 MG TABS Take 1 tablet by mouth daily with breakfast. 30 tablet 0   acyclovir (ZOVIRAX) 800 MG tablet Take 0.5 tablets (400 mg total) by mouth 2 (two) times daily. (Patient not taking: Reported on 01/31/2023) 10 tablet 0   cephALEXin (KEFLEX) 500 MG capsule Take 1 capsule (500 mg total) by mouth 3 (three) times daily. (Patient not taking: Reported on 01/21/2023) 30 capsule 2   Docosanol 10 % CREA Please some Abreva cream on the affected area 3 times daily until healed. (Patient not taking: Reported on 01/31/2023) 2 g 0   No facility-administered medications prior to visit.     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 - s/p Colpo 11/02/13 - CIN II -->    HIV (human immunodeficiency virus infection) (HCC)    HSV (herpes simplex virus) infection    Obesity    Oropharyngeal candidiasis 07/05/2014   Other fatigue    Shortness of breath on exertion      Past Surgical History:  Procedure Laterality Date   INCISION AND DRAINAGE ABSCESS     on abdomen and buttocks      Review of Systems  Constitutional:  Negative for appetite change, chills, diaphoresis, fatigue, fever and unexpected weight change.  Eyes:        Negative for acute change in vision  Respiratory:  Negative for chest tightness, shortness of breath and wheezing.   Cardiovascular:  Negative for chest pain.  Gastrointestinal:  Negative for diarrhea, nausea and vomiting.  Genitourinary:  Negative for dysuria, pelvic pain and vaginal discharge.  Musculoskeletal:  Negative for neck pain and neck stiffness.  Skin:  Negative for rash.  Neurological:  Negative for seizures, syncope, weakness and headaches.  Hematological:  Negative  for adenopathy. Does not bruise/bleed easily.  Psychiatric/Behavioral:  Negative for hallucinations.       Objective:    BP 131/89   Pulse 69   Temp (!) 97.4 F (36.3 C) (Temporal)   Resp 16   Wt (!) 323 lb 9.6 oz (146.8 kg)   SpO2 100%   BMI 54.69 kg/m  Nursing note and vital signs reviewed.  Physical Exam Constitutional:      General: She is not in acute distress.    Appearance: She is well-developed.  Eyes:     Conjunctiva/sclera: Conjunctivae normal.  Cardiovascular:     Rate and Rhythm: Normal rate and regular rhythm.     Heart sounds: Normal heart sounds. No murmur heard.    No friction rub. No gallop.  Pulmonary:     Effort: Pulmonary effort is normal. No respiratory distress.     Breath sounds: Normal breath sounds. No wheezing or rales.  Chest:     Chest wall: No tenderness.  Abdominal:     General: Bowel sounds are normal.     Palpations: Abdomen is soft.     Tenderness: There is no abdominal tenderness.  Musculoskeletal:     Cervical back: Neck supple.  Lymphadenopathy:     Cervical: No cervical adenopathy.  Skin:    General: Skin is warm and dry.     Findings: No rash.  Neurological:     Mental Status: She is alert and oriented to person, place, and time.  Psychiatric:        Mood and Affect: Mood normal.         01/31/2023    9:31 AM 06/15/2021    2:16 PM 11/14/2020    2:22 PM 10/04/2020    9:54 AM 09/08/2020    2:14 PM  Depression screen PHQ 2/9  Decreased Interest 0 0 0 3 0  Down, Depressed, Hopeless 0 0 0 3 0  PHQ - 2 Score 0 0 0 6 0  Altered sleeping   0 2   Tired, decreased energy   0 3   Change in appetite   0 3   Feeling bad or failure about yourself    0 3   Trouble concentrating   0 3   Moving slowly or fidgety/restless   0 0   Suicidal thoughts   0 1   PHQ-9 Score   0 21        Assessment & Plan:    Patient Active Problem List   Diagnosis Date Noted   Breast pain 01/31/2023   Healthcare maintenance 01/31/2023    Pre-diabetes 11/02/2020   Vitamin D deficiency 11/02/2020   At risk for diabetes mellitus 11/02/2020   Acute intractable headache 02/23/2020   Dizziness and giddiness 02/23/2020   Cyst of bone of left hand 12/10/2019   Visit for routine gyn exam 07/30/2019   Desire for pregnancy 07/30/2019   Dislocation of knee, anterior, left, closed 01/21/2016   Closed anterior dislocation of left knee 01/21/2016  Depression 07/26/2014   Herpes labialis 07/03/2014   HIV disease (HCC) 04/23/2014   General counseling for prescription of oral contraceptives 06/11/2013   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   Obesity 12/22/2012     Problem List Items Addressed This Visit       Other   HIV disease (HCC) - Primary (Chronic)    Ms. Gagnier appears to have adequately controlled virus however refill history is not very consistent. Discussed importance of taking medications regularly to reduce risk of disease progression and complications in the future. Reviewed U equals U and HIV transmission. Check lab work. Continue current dose of Symtuza. Reminded of importance of routine follow up which we will plan for 3 months or sooner if needed with lab work on the same day.      Relevant Medications   Darunavir-Cobicistat-Emtricitabine-Tenofovir Alafenamide (SYMTUZA) 800-150-200-10 MG TABS   Other Relevant Orders   COMPLETE METABOLIC PANEL WITH GFR   HIV-1 RNA quant-no reflex-bld   Hepatitis C antibody   T-helper cells (CD4) count (not at [facility])   Breast pain    Ongoing chronic bilateral breast pain with no other current symptoms. Obtain mammogram to check for underlying pathology. Emphasized importance of showing up for appointment.       Relevant Orders   MM Digital Diagnostic Bilat   US BREAST COMPLETE UNI LEFT INC AXILLA   US BREAST COMPLETE UNI RIGHT INC AXILLA   Healthcare maintenance    Discussed importance of safe sexual practice and condom use. Condoms and STD testing offered.  Vaccines reviewed and  declined.  Encouraged routine dental care and will schedule independently or can refer to [facility] if unable to do so independently.  Cervical cancer screening up to date.       Other Visit Diagnoses     Screening for STDs (sexually transmitted diseases)       Relevant Orders   RPR        I have discontinued [name] B. Firmin's acyclovir, Docosanol, and cephALEXin. I am also having her maintain her gabapentin and Symtuza.   Meds ordered this encounter  Medications   Darunavir-Cobicistat-Emtricitabine-Tenofovir Alafenamide (SYMTUZA) 800-150-200-10 MG TABS    Sig: Take 1 tablet by mouth daily with breakfast.    Dispense:  30 tablet    Refill:  3    Order Specific Question:   Supervising Provider    Answer:   Judyann Munson [[id number]]     Follow-up: Return in about 3 months (around 05/03/2023), or if symptoms worsen or fail to improve.   Marcos Eke, MSN, FNP-C Nurse Practitioner [facility] Main number: [phone]

## 2023-01-31 NOTE — Assessment & Plan Note (Signed)
Ongoing chronic bilateral breast pain with no other current symptoms. Obtain mammogram to check for underlying pathology. Emphasized importance of showing up for appointment.

## 2023-01-31 NOTE — Patient Instructions (Addendum)
Nice to see you.  We will check your lab work today.  Continue to take your medication daily as prescribed.  Refills have been sent to the pharmacy.  Plan for follow up in 3 months or sooner if needed with lab work on the same day.  Have a great day and stay safe!  

## 2023-02-19 ENCOUNTER — Other Ambulatory Visit: Payer: Medicaid Other

## 2023-02-21 ENCOUNTER — Ambulatory Visit
Admission: EM | Admit: 2023-02-21 | Discharge: 2023-02-21 | Disposition: A | Discharge status: Home / Self Care | Payer: 59 | Attending: Internal Medicine | Admitting: Internal Medicine

## 2023-02-21 ENCOUNTER — Other Ambulatory Visit: Payer: Self-pay

## 2023-02-21 DIAGNOSIS — A084 Viral intestinal infection, unspecified: Secondary | ICD-10-CM | POA: Insufficient documentation

## 2023-02-21 DIAGNOSIS — R112 Nausea with vomiting, unspecified: Secondary | ICD-10-CM | POA: Diagnosis not present

## 2023-02-21 DIAGNOSIS — Z1152 Encounter for screening for COVID-19: Secondary | ICD-10-CM | POA: Insufficient documentation

## 2023-02-21 MED ORDER — ONDANSETRON 4 MG PO TBDP: 4.0000 mg | ORAL_TABLET | Freq: Three times a day (TID) | ORAL | 0 refills | Status: DC | PRN

## 2023-02-21 MED ORDER — ONDANSETRON 4 MG PO TBDP
4.0000 mg | ORAL_TABLET | Freq: Once | ORAL | Status: AC
  Administered 2023-02-21: 4 mg via ORAL

## 2023-02-21 NOTE — ED Notes (Signed)
Pt states she had a runny nose yesterday, so she took some "off brand tussin" and began having diarrhea.

## 2023-02-21 NOTE — ED Triage Notes (Signed)
Pt presents to UC w/ c/o diarrhea and vomiting starting last night. Pt is able to keep fluids down. 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

## 2023-02-21 NOTE — ED Provider Notes (Signed)
[facility]    CSN: [id number] Arrival date & time: 02/21/23  0932      History   Chief Complaint No chief complaint on file.   HPI COLUMBINE ACRE is a 37 y.o. female presents for evaluation of nausea/vomiting/diarrhea.  Patient reports last night she developed nausea/vomiting/diarrhea with 2 episodes of nonbloody vomit and 2-3 episodes of nonbloody diarrhea.  Also reports some runny nose but denies sore throat, cough, congestion, abdominal pain, fevers.  Denies history of GI diagnoses such as Crohn's, IBS, colitis.  Does report sick contact via work colleague who has a stomach flu.  States she has been able to stay hydrated.  No dysuria.  She took an off brand cough medicine yesterday.  No other concerns at this time.  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 - s/p Colpo 11/02/13 - CIN II -->    HIV (human immunodeficiency virus infection) (HCC)    HSV (herpes simplex virus) infection    Obesity    Oropharyngeal candidiasis 07/05/2014   Other fatigue    Shortness of breath on exertion     Patient Active Problem List   Diagnosis Date Noted   Breast pain 01/31/2023   Healthcare maintenance 01/31/2023   Pre-diabetes 11/02/2020   Vitamin D deficiency 11/02/2020   At risk for diabetes mellitus 11/02/2020   Acute intractable headache 02/23/2020   Dizziness and giddiness 02/23/2020   Cyst of bone of left hand 12/10/2019   Visit for routine gyn exam 07/30/2019   Desire for pregnancy 07/30/2019   Dislocation of knee, anterior, left, closed 01/21/2016   Closed anterior dislocation of left knee 01/21/2016   Depression 07/26/2014   Herpes labialis 07/03/2014   HIV disease (HCC) 04/23/2014   General counseling for prescription of oral contraceptives 06/11/2013   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 Authorizing Provider  ondansetron (ZOFRAN-ODT) 4 MG disintegrating tablet Take 1 tablet (4 mg total) by mouth every 8 (eight) hours as needed for nausea or vomiting. 02/21/23  Yes Radford Pax, NP  Darunavir-Cobicistat-Emtricitabine-Tenofovir Alafenamide [facility]) [id number] MG TABS Take 1 tablet by mouth daily with breakfast. 01/31/23   Veryl Speak, FNP  gabapentin (NEURONTIN) 300 MG capsule TAKE 1 CAPSULE(300 MG) BY MOUTH THREE TIMES DAILY FOR 7 DAYS 10/31/22   Edwin Cap, DPM    Family History Family History  Problem Relation Age of Onset   Heart disease Mother    Sudden death Mother    Stroke Mother  Hypertension Maternal Grandmother    Diabetes Maternal Grandmother    Heart disease Maternal Grandmother    Cancer Maternal Grandmother    Hypertension Paternal Grandmother    Diabetes Paternal Grandmother    Heart disease Paternal Grandmother     Social History Social History   Tobacco Use   Smoking status: Never   Smokeless tobacco: Never  Vaping Use   Vaping status: Never Used  Substance Use Topics   Alcohol use: Yes    Alcohol/week: 2.0 standard drinks of alcohol    Types: 2 Standard drinks or equivalent per week    Comment: occasional   Drug use: Not Currently     Allergies   Dilaudid [hydromorphone hcl]   Review of Systems Review of Systems  HENT:  Positive for rhinorrhea.   Gastrointestinal:  Positive for diarrhea, nausea and vomiting.     Physical Exam Triage Vital Signs ED Triage  Vitals  Encounter Vitals Group     BP 02/21/23 0945 102/72     Systolic BP Percentile --      Diastolic BP Percentile --      Pulse Rate 02/21/23 0945 85     Resp 02/21/23 0945 16     Temp 02/21/23 0945 99.2 F (37.3 C)     Temp Source 02/21/23 0945 Oral     SpO2 02/21/23 0945 95 %     Weight --      Height --      Head Circumference --      Peak Flow --      Pain Score 02/21/23 0949 2     Pain Loc --      Pain Education --      Exclude from Growth Chart --    No data found.  Updated Vital Signs BP 102/72 (BP Location: Right Arm)   Pulse 85   Temp 99.2 F (37.3 C) (Oral)   Resp 16   SpO2 95%   Visual Acuity Right Eye Distance:   Left Eye Distance:   Bilateral Distance:    Right Eye Near:   Left Eye Near:    Bilateral Near:     Physical Exam Vitals and nursing note reviewed.  Constitutional:      General: She is not in acute distress.    Appearance: Normal appearance. She is obese. She is not ill-appearing.  HENT:     Head: Normocephalic and atraumatic.     Nose: No congestion or rhinorrhea.  Eyes:     Pupils: Pupils are equal, round, and reactive to light.  Cardiovascular:     Rate and Rhythm: Normal rate and regular rhythm.     Heart sounds: Normal heart sounds.  Pulmonary:     Effort: Pulmonary effort is normal.     Breath sounds: Normal breath sounds.  Abdominal:     General: Bowel sounds are normal.     Palpations: Abdomen is soft.     Tenderness: There is no abdominal tenderness. There is no guarding.  Skin:    General: Skin is warm.  Neurological:     General: No focal deficit present.     Mental Status: She is alert and oriented to person, place, and time.  Psychiatric:        Mood and Affect: Mood normal.        Behavior: Behavior normal.      UC Treatments / Results  Labs (all labs ordered are listed, but only abnormal results are displayed) Labs Reviewed  SARS CORONAVIRUS 2 (TAT 6-24 HRS)    EKG   Radiology No results  found.  Procedures Procedures (including critical care time)  Medications Ordered in UC Medications  ondansetron (ZOFRAN-ODT) disintegrating tablet 4 mg (has no administration in time range)    Initial Impression / Assessment and Plan / UC Course  I have reviewed the triage vital signs and the nursing notes.  Pertinent labs & imaging results that were available during my care of the patient were reviewed by me and considered in my medical decision making (see chart for details).     Reviewed exam and symptoms with patient.  No red flags.  COVID PCR and will contact if positive.  Discussed likely viral enteritis and symptomatic treatment.  Patient given Zofran in clinic and Rx sent to pharmacy.  Encouraged hydration/electrolyte replacement as well as bland/brat diet.  PCP follow-up 2 days for recheck.  ER precautions reviewed and patient verbalized understanding. Final Clinical Impressions(s) / UC Diagnoses   Final diagnoses:  Nausea and vomiting, unspecified vomiting type  Viral gastroenteritis     Discharge Instructions      The clinic will call you with results of the COVID test done today if positive.  You may take Zofran every 8 hours as needed for nausea or vomiting.  You were given a dose of this medication while you are in the clinic.  Lots of rest and fluids.  Use Gatorade, Powerade, Pedialyte, water.  Bland diet such as rice, applesauce, toast, bananas and advance as you tolerate.  Please follow-up with your PCP in 2 days for recheck.  Please go to the ER if you develop any worsening symptoms.  I hope you feel better soon!    ED Prescriptions     Medication Sig Dispense Auth. Provider   ondansetron (ZOFRAN-ODT) 4 MG disintegrating tablet Take 1 tablet (4 mg total) by mouth every 8 (eight) hours as needed for nausea or vomiting. 10 tablet Radford Pax, NP      PDMP not reviewed this encounter.   Radford Pax, NP 02/21/23 1003

## 2023-02-21 NOTE — Discharge Instructions (Signed)
The clinic will call you with results of the COVID test done today if positive.  You may take Zofran every 8 hours as needed for nausea or vomiting.  You were given a dose of this medication while you are in the clinic.  Lots of rest and fluids.  Use Gatorade, Powerade, Pedialyte, water.  Bland diet such as rice, applesauce, toast, bananas and advance as you tolerate.  Please follow-up with your PCP in 2 days for recheck.  Please go to the ER if you develop any worsening symptoms.  I hope you feel better soon!

## 2023-02-22 LAB — SARS CORONAVIRUS 2 (TAT 6-24 HRS): SARS Coronavirus 2: NEGATIVE

## 2023-02-26 ENCOUNTER — Telehealth: Payer: Self-pay | Admitting: Podiatry

## 2023-02-26 NOTE — Telephone Encounter (Signed)
Pt is dr Milas Gain would like a rx refill and a handicap placard.

## 2023-03-05 DIAGNOSIS — S83512D Sprain of anterior cruciate ligament of left knee, subsequent encounter: Secondary | ICD-10-CM | POA: Diagnosis not present

## 2023-03-07 ENCOUNTER — Telehealth: Payer: Self-pay

## 2023-03-07 MED ORDER — GABAPENTIN 300 MG PO CAPS: 300.0000 mg | ORAL_CAPSULE | Freq: Three times a day (TID) | ORAL | 0 refills | Status: DC | PRN

## 2023-03-07 NOTE — Telephone Encounter (Signed)
I called and left detailed message for patient

## 2023-03-07 NOTE — Telephone Encounter (Signed)
Patient called requesting refill on hydrocodone as well as a handicap placard. Patient states she has been waiting 2 weeks for medication and she is in pain.

## 2023-03-07 NOTE — Telephone Encounter (Signed)
Patient would like to have the gabapentin sent to pharmacy.

## 2023-04-02 ENCOUNTER — Encounter: Payer: Self-pay | Admitting: Podiatry

## 2023-04-02 ENCOUNTER — Ambulatory Visit (INDEPENDENT_AMBULATORY_CARE_PROVIDER_SITE_OTHER): Payer: Medicaid Other | Admitting: Podiatry

## 2023-04-02 VITALS — BP 138/80 | HR 89 | Temp 98.0°F | Resp 18 | Ht 64.5 in | Wt 323.0 lb

## 2023-04-02 DIAGNOSIS — M2141 Flat foot [pes planus] (acquired), right foot: Secondary | ICD-10-CM

## 2023-04-02 DIAGNOSIS — M76822 Posterior tibial tendinitis, left leg: Secondary | ICD-10-CM

## 2023-04-02 DIAGNOSIS — M2142 Flat foot [pes planus] (acquired), left foot: Secondary | ICD-10-CM | POA: Diagnosis not present

## 2023-04-02 NOTE — Patient Instructions (Signed)
Posterior Tibial Tendinitis  Posterior tibial tendinitis is irritation of a tendon called the posterior tibial tendon. Your posterior tibial tendon is a cord-like tissue that connects bones of your lower leg and foot to a muscle that: Supports your arch. Helps you raise up on your toes. Helps you turn your foot down and in. This condition causes foot and ankle pain. It can also lead to a flat foot. What are the causes? This condition is most often caused by repeated stress to the tendon (overuse injury). It can also be caused by a sudden injury that stresses the tendon, such as landing on your foot after jumping or falling. What increases the risk? This condition is more likely to develop in: People who play a sport that involves putting a lot of pressure on the feet, such as: Basketball. Tennis. Soccer. Hockey. Runners. Females who are older than 38 years of age and are overweight. People with diabetes. People with decreased foot stability. People with flat feet. What are the signs or symptoms? Symptoms include: Pain in the inner ankle. Pain at the arch of your foot. Pain that gets worse with running, walking, or standing. Swelling on the inside of your ankle and foot. Weakness in your ankle or foot. Inability to stand up on tiptoe. Flattening of the arch of your foot. How is this diagnosed? This condition may be diagnosed based on: Your symptoms. Your medical history. A physical exam. Tests, such as: X-ray. MRI. Ultrasound. How is this treated? This condition may be treated by: Putting ice to the injured area. Taking NSAIDs, such as ibuprofen, to reduce pain and swelling. Wearing a special shoe or shoe insert to support your arch (orthotic). Having physical therapy. Replacing high-impact exercise with low-impact exercise, such as swimming or cycling. If your symptoms do not improve with these treatments, you may need to wear a splint, removable walking boot, or short  leg cast for 6-8 weeks to keep your foot and ankle still (immobilized). Follow these instructions at home: If you have a cast, splint, or boot: Keep it clean and dry. Check the skin around it every day. Tell your health care provider about any concerns. If you have a cast: Do not stick anything inside it to scratch your skin. Doing that increases your risk of infection. You may put lotion on dry skin around the edges of the cast. Do not put lotion on the skin underneath the cast. If you have a splint or boot: Wear it as told by your health care provider. Remove it only as told by your health care provider. Loosen it if your toes tingle, become numb, or turn cold and blue. Bathing Do not take baths, swim, or use a hot tub until your health care provider approves. Ask your health care provider if you may take showers. If your cast, splint, or boot is not waterproof: Do not let it get wet. Cover it with a waterproof covering while you take a bath or a shower. Managing pain and swelling   If directed, put ice on the injured area. If you have a removable splint or boot, remove it as told by your health care provider. Put ice in a plastic bag. Place a towel between your skin and the bag or between your cast and the bag. Leave the ice on for 20 minutes, 2-3 times a day. Move your toes often to reduce stiffness and swelling. Raise (elevate) the injured area above the level of your heart while you are sitting   or lying down. Activity Do not use the injured foot to support your body weight until your health care provider says that you can. Use crutches as told by your health care provider. Do not do activities that make pain or swelling worse. Ask your health care provider when it is safe to drive if you have a cast, splint, or boot on your foot. Return to your normal activities as told by your health care provider. Ask your health care provider what activities are safe for you. Do exercises as  told by your health care provider. General instructions Take over-the-counter and prescription medicines only as told by your health care provider. If you have an orthotic, use it as told by your health care provider. Keep all follow-up visits as told by your health care provider. This is important. How is this prevented? Wear footwear that is appropriate to your athletic activity. Avoid athletic activities that cause pain or swelling in your ankle or foot. Before being active, do range-of-motion and stretching exercises. If you develop pain or swelling while training, stop training. If you have pain or swelling that does not improve after a few days of rest, see your health care provider. If you start a new athletic activity, start gradually so you can build up your strength and flexibility. Contact a health care provider if: Your symptoms get worse. Your symptoms do not improve in 6-8 weeks. You develop new, unexplained symptoms. Your splint, boot, or cast gets damaged. Summary Posterior tibial tendinitis is irritation of a tendon called the posterior tibial tendon. This condition is most often caused by repeated stress to the tendon (overuse injury). This condition causes foot pain and ankle pain. It can also lead to a flat foot. This condition may be treated by not doing high-impact activities, applying ice, having physical therapy, wearing orthotics, and wearing a cast, splint, or boot if needed. This information is not intended to replace advice given to you by your health care provider. Make sure you discuss any questions you have with your health care provider. Document Revised: 10/07/2018 Document Reviewed: 08/14/2018 Elsevier Patient Education  2020 [employer]  Posterior Tibial Tendinitis Rehab Ask your health care provider which exercises are safe for you. Do exercises exactly as told by your health care provider and adjust them as directed. It is normal to feel mild  stretching, pulling, tightness, or discomfort as you do these exercises. Stop right away if you feel sudden pain or your pain gets worse. Do not begin these exercises until told by your health care provider. Stretching and range-of-motion exercises These exercises warm up your muscles and joints and improve the movement and flexibility in your ankle and foot. These exercises may also help to relieve pain. Standing wall calf stretch, knee straight   Stand with your hands against a wall. Extend your left / right leg behind you, and bend your front knee slightly. If directed, place a folded washcloth under the arch of your foot for support. Point the toes of your back foot slightly inward. Keeping your heels on the floor and your back knee straight, shift your weight toward the wall. Do not allow your back to arch. You should feel a gentle stretch in your upper left / right calf. Hold this position for 10 seconds. Repeat 10 times. Complete this exercise 2 times a day. Standing wall calf stretch, knee bent Stand with your hands against a wall. Extend your left / right leg behind you, and bend your front   knee slightly. If directed, place a folded washcloth under the arch of your foot for support. Point the toes of your back foot slightly inward. Unlock your back knee so it is bent. Keep your heels on the floor. You should feel a gentle stretch deep in your lower left / right calf. Hold this position for 10 seconds. Repeat 10 times. Complete this exercise 2 times a day. Strengthening exercises These exercises build strength and endurance in your ankle and foot. Endurance is the ability to use your muscles for a long time, even after they get tired. Ankle inversion with band Secure one end of a rubber exercise band or tubing to a fixed object, such as a table leg or a pole, that will stay still when the band is pulled. Loop the other end of the band around the middle of your left / right foot. Sit  on the floor facing the object with your left / right leg extended. The band or tube should be slightly tense when your foot is relaxed. Leading with your big toe, slowly bring your left / right foot and ankle inward, toward your other foot (inversion). Hold this position for 10 seconds. Slowly return your foot to the starting position. Repeat 10 times. Complete this exercise 2 times a day. Towel curls   Sit in a chair on a non-carpeted surface, and put your feet on the floor. Place a towel in front of your feet. Keeping your heel on the floor, put your left / right foot on the towel. Pull the towel toward you by grabbing the towel with your toes and curling them under. Keep your heel on the floor while you do this. Let your toes relax. Grab the towel with your toes again. Keep going until the towel is completely underneath your foot. Repeat 10 times. Complete this exercise 2 times a day. Balance exercise This exercise improves or maintains your balance. Balance is important in preventing falls. Single leg stand Without wearing shoes, stand near a railing or in a doorway. You may hold on to the railing or door frame as needed for balance. Stand on your left / right foot. Keep your big toe down on the floor and try to keep your arch lifted. If balancing in this position is too easy, try the exercise with your eyes closed or while standing on a pillow. Hold this position for 10 seconds. Repeat 10 times. Complete this exercise 2 times a day. This information is not intended to replace advice given to you by your health care provider. Make sure you discuss any questions you have with your health care provider.  

## 2023-04-02 NOTE — Progress Notes (Signed)
  Subjective:  Patient ID: Brenda Tyler, female    DOB: June 20, 1986,  MRN: [id number]  Chief Complaint  Patient presents with   Foot Pain    Chronic right ankle pain/ req injection      37 y.o. female returns for post-op check.  Overall doing well on the lateral ankle she is not have any pain there she is going to work she does feel some fatigue at the end of the day but she is having new pain on the inside of the ankle now  Review of Systems: Negative except as noted in the HPI. Denies N/V/F/Ch.   Objective:   Vitals:   04/02/23 0902  BP: 138/80  Pulse: 89  Resp: 18  Temp: 98 F (36.7 C)  SpO2: 100%   Body mass index is 54.59 kg/m. Constitutional Well developed. Well nourished.  Vascular Foot warm and well perfused. Capillary refill normal to all digits.  Calf is soft and supple, no posterior calf or knee pain, negative Homans' sign  Neurologic Normal speech. Oriented to person, place, and time. Epicritic sensation to light touch grossly present bilaterally.  No allodynia or signs of CRPS type I or II  Dermatologic Scar is nontender and not hypertrophic  Orthopedic: Continued good stability laterally and no pain, medially she has pain on the PT tendon in the retromalleolar groove distal to the navicular insertion   Multiple view plain film radiographs:  unchanged alignment and no new osseous abnormalities  MRI 11/07/2022 showed no new abnormalities in the area of repair with improvement in prior bony edema Assessment:   1. Posterior tibial tendinitis of left lower extremity   2. Pes planus of both feet    Plan:  Patient was evaluated and treated and all questions answered.  S/p foot surgery right -From a surgical standpoint doing very well and does not have any pain on the lateral ankle and maintains good stability, she has new PT tendon pain and this is likely secondary to her pes planus deformity.  We discussed treatment of this and I recommended home physical  therapy plan and this was dispensed to her discussed that if not improving could recommend formal physical therapy but I do think she likely would benefit from continued use of a Tri-Lock ankle brace which was dispensed today to support the medial ankle and allow for soft tissue healing for the next 4 to 6 weeks, she will return to see me in 2 months.  She was also casted for custom molded foot orthoses today I do think she would benefit from a custom molded longitudinal arch support to prevent collapse of the medial longitudinal arch and offload the PT tendon. No follow-ups on file.

## 2023-05-03 ENCOUNTER — Other Ambulatory Visit (HOSPITAL_COMMUNITY): Payer: Self-pay

## 2023-05-03 ENCOUNTER — Encounter: Payer: Self-pay | Admitting: Family

## 2023-05-03 ENCOUNTER — Other Ambulatory Visit: Payer: Self-pay

## 2023-05-03 ENCOUNTER — Ambulatory Visit (INDEPENDENT_AMBULATORY_CARE_PROVIDER_SITE_OTHER): Payer: Medicaid Other | Admitting: Family

## 2023-05-03 VITALS — BP 120/57 | HR 70 | Temp 97.8°F | Ht 65.5 in | Wt 316.0 lb

## 2023-05-03 DIAGNOSIS — Z113 Encounter for screening for infections with a predominantly sexual mode of transmission: Secondary | ICD-10-CM

## 2023-05-03 DIAGNOSIS — B001 Herpesviral vesicular dermatitis: Secondary | ICD-10-CM | POA: Diagnosis not present

## 2023-05-03 DIAGNOSIS — N926 Irregular menstruation, unspecified: Secondary | ICD-10-CM | POA: Diagnosis not present

## 2023-05-03 DIAGNOSIS — B20 Human immunodeficiency virus [HIV] disease: Secondary | ICD-10-CM

## 2023-05-03 DIAGNOSIS — Z0000 Encounter for general adult medical examination without abnormal findings: Secondary | ICD-10-CM

## 2023-05-03 NOTE — Assessment & Plan Note (Signed)
Concerned about HSV infection passing out to others.  Discussed nature of HSV including lack of cure likely through treatment with outbreaks versus suppression.  She will consider options and will discuss at next office visit.

## 2023-05-03 NOTE — Assessment & Plan Note (Signed)
Brenda Tyler has been having irregular menstrual bleeding and was previously under the care of gynecology.  She has been unable to get in touch with her previous gynecologist and requesting referral to gynecology for further evaluation and treatment.  Referral placed.

## 2023-05-03 NOTE — Assessment & Plan Note (Signed)
Brenda Tyler continues to have well-controlled virus with good adherence and tolerance to Symtuza.  Reviewed previous lab work and discussed plan of care and U equals U.  Check blood work.  Continue current dose of Symtuza.  Samples provided.  Met with financial assistance to determine steps in getting medication covered/evaluation of insurance.  Plan for follow-up in 1 month or sooner if needed.

## 2023-05-03 NOTE — Assessment & Plan Note (Addendum)
Discussed importance of safe sexual practice and condom use. Condoms and STD testing offered.  Vaccinations reviewed -declined. Cervical cancer screening up-to-date. Due for routine dental care and will refer to [facility].

## 2023-05-03 NOTE — Patient Instructions (Addendum)
Nice to see you.  We will check your lab work today.  Continue to take your medication daily as prescribed.  Refills have been sent to the pharmacy.  Plan for follow up in 1 months or sooner if needed with lab work on the same day.  Have a great day and stay safe!  

## 2023-05-03 NOTE — Progress Notes (Signed)
Brief Narrative   Patient ID: Brenda Tyler, female    DOB: 1986/01/19, 37 y.o.   MRN: [id number]  Ms. Garde is a 37 y/o AA female diagnosed with HIV disease in October 2015 with risk factor of heterosexual contact. Initial viral load was 34,970 with CD4 count 190. Entered care at [facility] Stage 3. Genotype with K103N (R - efavirenz, nevirapine). No previous history of opportunistic infection. [id number] and Quantiferon Gold negative. ART experienced with Stribild, Leroy Kennedy and currently Symtuza.   Subjective:    Chief Complaint  Patient presents with   Follow-up    B20    HPI:  Brenda Tyler is a 37 y.o. female with HIV disease last seen on 01/31/2023 with well-controlled virus and good adherence and tolerance to Symtuza.  Viral load was undetectable with CD4 count 485.  RPR was nonreactive with kidney function, liver function, electrolytes being normal.  Here today for routine follow-up.  Ms. Odell has been doing okay since her last office visit and continues to take Symtuza as prescribed.  Having difficulties obtaining refills of her medications secondary to Medicaid not covering the medication.  She may have an alternative insurance but she is unclear of this.  Also has concern for abnormal menstrual bleeding and is requesting referral to gynecology for further evaluation and treatment.  Condoms and STD testing offered.  Continues to work full-time.  Drinks alcohol on occasion with no recreational/illicit drug use or tobacco use.  Healthcare maintenance reviewed.   Denies fevers, chills, night sweats, headaches, changes in vision, neck pain/stiffness, nausea, diarrhea, vomiting, lesions or rashes.  Lab Results  Component Value Date   CD4TCELL 35 01/31/2023   CD4TABS 485 01/25/2022   Lab Results  Component Value Date   HIV1RNAQUANT Not Detected 01/31/2023     Allergies  Allergen Reactions   Dilaudid [Hydromorphone Hcl] Other (See Comments)    Makes her dizzy       Outpatient Medications Prior to Visit  Medication Sig Dispense Refill   Darunavir-Cobicistat-Emtricitabine-Tenofovir Alafenamide (SYMTUZA) 800-150-200-10 MG TABS Take 1 tablet by mouth daily with breakfast. 30 tablet 3   gabapentin (NEURONTIN) 300 MG capsule Take 1 capsule (300 mg total) by mouth 3 (three) times daily as needed (pain). 90 capsule 0   ondansetron (ZOFRAN-ODT) 4 MG disintegrating tablet Take 1 tablet (4 mg total) by mouth every 8 (eight) hours as needed for nausea or vomiting. (Patient not taking: Reported on 05/03/2023) 10 tablet 0   No facility-administered medications prior to visit.     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  Eyes:        Negative for acute change in vision  Respiratory:  Negative for chest tightness, shortness of breath and wheezing.   Cardiovascular:  Negative for chest pain.  Gastrointestinal:  Negative for diarrhea, nausea and vomiting.  Genitourinary:  Negative for dysuria, pelvic pain and vaginal discharge.  Musculoskeletal:  Negative for neck pain and neck stiffness.  Skin:  Negative for rash.  Neurological:  Negative for seizures, syncope, weakness and headaches.  Hematological:  Negative for adenopathy. Does not bruise/bleed easily.   Psychiatric/Behavioral:  Negative for hallucinations.       Objective:    BP (!) 120/57   Pulse 70   Temp 97.8 F (36.6 C) (Temporal)   Ht 5' 5.5" (1.664 m)   Wt (!) 316 lb (143.3 kg)   SpO2 99%   BMI 51.79 kg/m  Nursing note and vital signs reviewed.  Physical Exam Constitutional:      General: She is not in acute distress.    Appearance: She is well-developed.  Eyes:     Conjunctiva/sclera: Conjunctivae normal.  Cardiovascular:     Rate and Rhythm: Normal rate and regular rhythm.     Heart sounds: Normal heart sounds. No murmur heard.    No friction rub. No gallop.  Pulmonary:     Effort: Pulmonary effort is normal. No respiratory distress.     Breath sounds: Normal breath sounds. No wheezing or rales.  Chest:     Chest wall: No tenderness.  Musculoskeletal:     Cervical back: Neck supple.  Lymphadenopathy:     Cervical: No cervical adenopathy.  Skin:    General: Skin is warm and dry.     Findings: No rash.  Neurological:     Mental Status: She is alert.         05/03/2023   10:46 AM 01/31/2023    9:31 AM 06/15/2021    2:16 PM 11/14/2020    2:22 PM 10/04/2020    9:54 AM  Depression screen PHQ 2/9  Decreased Interest 0 0 0 0 3  Down, Depressed, Hopeless 0 0 0 0 3  PHQ - 2 Score 0 0 0 0 6  Altered sleeping    0 2  Tired, decreased energy    0 3  Change in appetite    0 3  Feeling bad or failure about yourself     0 3  Trouble concentrating    0 3  Moving slowly or fidgety/restless    0 0  Suicidal thoughts    0 1  PHQ-9 Score    0 21       Assessment & Plan:    Patient Active Problem List   Diagnosis Date Noted   Irregular menstrual bleeding 05/03/2023   Breast pain 01/31/2023   Healthcare maintenance 01/31/2023   Pre-diabetes 11/02/2020   Vitamin D deficiency 11/02/2020   At risk for diabetes mellitus 11/02/2020   Acute intractable headache 02/23/2020   Dizziness and giddiness 02/23/2020   Cyst of bone of left hand 12/10/2019   Visit for  routine gyn exam 07/30/2019   Desire for pregnancy 07/30/2019   Dislocation of knee, anterior, left, closed 01/21/2016   Closed anterior dislocation of left knee 01/21/2016   Depression 07/26/2014   Herpes labialis 07/03/2014   HIV disease (HCC) 04/23/2014   General counseling for prescription of oral contraceptives 06/11/2013   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  Discussed nature of HSV including lack of cure likely through treatment with outbreaks versus suppression.  She will consider options and will discuss at next office visit.        Other   HIV disease (HCC) - Primary (Chronic)    Ms. Blakely continues to have well-controlled virus with good adherence and tolerance to [employer].  Reviewed previous lab work and discussed plan of care and U equals U.  Check blood work.  Continue current dose of Symtuza.  Samples provided.  Met with financial assistance to determine steps in getting medication covered/evaluation of insurance.  Plan for follow-up in 1 month or sooner if needed.      Relevant Orders   COMPLETE METABOLIC PANEL WITH GFR   HIV-1 RNA quant-no reflex-bld   T-helper cells (CD4) count (not at [facility])   Healthcare maintenance    Discussed importance of safe sexual practice and condom use. Condoms and STD testing offered.  Vaccinations reviewed -declined. Cervical cancer screening up-to-date. Due for routine dental care and will refer to [facility].      Irregular menstrual bleeding    Ms. Kovaleski has been having irregular menstrual bleeding and was previously under the care of gynecology.  She has been unable to get in touch with her previous gynecologist and requesting referral to gynecology for further evaluation and treatment.  Referral placed.      Relevant Orders   Ambulatory referral to Gynecology   Other Visit  Diagnoses     Screening for STDs (sexually transmitted diseases)       Relevant Orders   RPR        I am having Jametta B. Haran maintain her Symtuza, ondansetron, and gabapentin.   No orders of the defined types were placed in this encounter.    Follow-up: Return in about 1 month (around 06/02/2023). or sooner if needed.    Marcos Eke, MSN, FNP-C Nurse Practitioner [facility] Main number: [phone]

## 2023-05-07 LAB — COMPLETE METABOLIC PANEL WITH GFR
AG Ratio: 1.2 (calc) (ref 1.0–2.5)
ALT: 14 U/L (ref 6–29)
AST: 11 U/L (ref 10–30)
Albumin: 4.1 g/dL (ref 3.6–5.1)
Alkaline phosphatase (APISO): 104 U/L (ref 31–125)
BUN: 17 mg/dL (ref 7–25)
CO2: 28 mmol/L (ref 20–32)
Calcium: 9.6 mg/dL (ref 8.6–10.2)
Chloride: 106 mmol/L (ref 98–110)
Creat: 0.76 mg/dL (ref 0.50–0.97)
Globulin: 3.5 g/dL (ref 1.9–3.7)
Glucose, Bld: 87 mg/dL (ref 65–99)
Potassium: 4.1 mmol/L (ref 3.5–5.3)
Sodium: 141 mmol/L (ref 135–146)
Total Bilirubin: 0.4 mg/dL (ref 0.2–1.2)
Total Protein: 7.6 g/dL (ref 6.1–8.1)
eGFR: 103 mL/min/{1.73_m2} (ref >=60)

## 2023-05-07 LAB — HIV-1 RNA QUANT-NO REFLEX-BLD
HIV 1 RNA Quant: NOT DETECTED {copies}/mL
HIV-1 RNA Quant, Log: NOT DETECTED {Log_copies}/mL

## 2023-05-07 LAB — RPR: RPR Ser Ql: NONREACTIVE

## 2023-05-07 LAB — T-HELPER CELLS (CD4) COUNT (NOT AT ARMC)
Absolute CD4: 886 {cells}/uL (ref 490–1740)
CD4 T Helper %: 38 % (ref 30–61)
Total lymphocyte count: 2305 {cells}/uL (ref 850–3900)

## 2023-05-25 ENCOUNTER — Emergency Department (HOSPITAL_COMMUNITY): Payer: Medicaid Other

## 2023-05-25 ENCOUNTER — Emergency Department (HOSPITAL_COMMUNITY)
Admission: EM | Admit: 2023-05-25 | Discharge: 2023-05-25 | Disposition: A | Discharge status: Home / Self Care | Payer: Medicaid Other | Attending: Emergency Medicine | Admitting: Emergency Medicine

## 2023-05-25 ENCOUNTER — Encounter (HOSPITAL_COMMUNITY): Payer: Self-pay

## 2023-05-25 ENCOUNTER — Other Ambulatory Visit: Payer: Self-pay

## 2023-05-25 DIAGNOSIS — Z21 Asymptomatic human immunodeficiency virus [HIV] infection status: Secondary | ICD-10-CM | POA: Diagnosis not present

## 2023-05-25 DIAGNOSIS — Z23 Encounter for immunization: Secondary | ICD-10-CM | POA: Diagnosis not present

## 2023-05-25 DIAGNOSIS — S0990XA Unspecified injury of head, initial encounter: Secondary | ICD-10-CM | POA: Diagnosis not present

## 2023-05-25 DIAGNOSIS — S0181XA Laceration without foreign body of other part of head, initial encounter: Secondary | ICD-10-CM

## 2023-05-25 DIAGNOSIS — S01112A Laceration without foreign body of left eyelid and periocular area, initial encounter: Secondary | ICD-10-CM | POA: Diagnosis not present

## 2023-05-25 MED ORDER — TETANUS-DIPHTH-ACELL PERTUSSIS 5-2.5-18.5 LF-MCG/0.5 IM SUSY
0.5000 mL | PREFILLED_SYRINGE | Freq: Once | INTRAMUSCULAR | Status: AC
  Administered 2023-05-25: 0.5 mL via INTRAMUSCULAR
  Filled 2023-05-25: qty 0.5

## 2023-05-25 MED ORDER — OXYCODONE-ACETAMINOPHEN 5-325 MG PO TABS
1.0000 | ORAL_TABLET | Freq: Once | ORAL | Status: AC
  Administered 2023-05-25: 1 via ORAL
  Filled 2023-05-25: qty 1

## 2023-05-25 MED ORDER — LIDOCAINE-EPINEPHRINE (PF) 2 %-1:200000 IJ SOLN
20.0000 mL | Freq: Once | INTRAMUSCULAR | Status: AC
Start: 2023-05-25 — End: 2023-05-25
  Administered 2023-05-25: 20 mL
  Filled 2023-05-25: qty 20
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
## 2023-05-25 NOTE — ED Notes (Signed)
Patient transported to CT 
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
## 2023-05-31 ENCOUNTER — Ambulatory Visit: Payer: Medicaid Other | Admitting: Family

## 2023-06-03 ENCOUNTER — Ambulatory Visit (HOSPITAL_COMMUNITY)
Admission: EM | Admit: 2023-06-03 | Discharge: 2023-06-03 | Disposition: A | Discharge status: Home / Self Care | Payer: Medicaid Other

## 2023-06-03 ENCOUNTER — Encounter (HOSPITAL_COMMUNITY): Payer: Self-pay

## 2023-06-03 DIAGNOSIS — Z4802 Encounter for removal of sutures: Secondary | ICD-10-CM | POA: Diagnosis not present

## 2023-06-03 DIAGNOSIS — S01112D Laceration without foreign body of left eyelid and periocular area, subsequent encounter: Secondary | ICD-10-CM

## 2023-06-03 NOTE — Discharge Instructions (Signed)
Sutures have been removed

## 2023-06-03 NOTE — ED Triage Notes (Addendum)
Pt presents for suture removal after having placed on 11/30 at [facility]. Pt's sutures is located on left eyebrow, 4 sutures present on arrival. Area appears to be clean and dry. Only other complaint at this time is pain in left eye, rates it a 5/10.

## 2023-06-03 NOTE — ED Provider Notes (Signed)
[facility]    CSN: [id number] Arrival date & time: 06/03/23  1014      History   Chief Complaint Chief Complaint  Patient presents with   Suture / Staple Removal    HPI MASIEL LOFTHOUSE is a 37 y.o. female.  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 - s/p Colpo 11/02/13 - CIN II -->    HIV (human immunodeficiency virus infection) (HCC)    HSV (herpes simplex virus) infection    Obesity    Oropharyngeal candidiasis 07/05/2014   Other fatigue    Shortness of breath on exertion     Patient Active Problem List   Diagnosis Date Noted   Irregular menstrual bleeding 05/03/2023   Breast pain 01/31/2023   Healthcare maintenance 01/31/2023   Pre-diabetes 11/02/2020   Vitamin D deficiency 11/02/2020   At risk for diabetes mellitus 11/02/2020   Acute intractable headache 02/23/2020   Dizziness and giddiness 02/23/2020   Cyst of bone of left hand 12/10/2019   Visit for routine gyn exam 07/30/2019   Desire for pregnancy 07/30/2019   Dislocation of knee, anterior, left, closed 01/21/2016   Closed anterior dislocation of left knee 01/21/2016   Depression 07/26/2014   Herpes labialis 07/03/2014   HIV disease (HCC) 04/23/2014   General counseling for prescription of oral contraceptives 06/11/2013   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 Authorizing Provider  Darunavir-Cobicistat-Emtricitabine-Tenofovir Alafenamide (SYMTUZA) 800-150-200-10 MG TABS Take 1 tablet by mouth daily with breakfast. 01/31/23   Veryl Speak, FNP  gabapentin (NEURONTIN) 300 MG capsule Take 1 capsule (300 mg total) by mouth 3 (three) times daily as needed (pain). 03/07/23   McDonald, Rachelle Hora, DPM  ondansetron (ZOFRAN-ODT) 4 MG disintegrating tablet Take 1 tablet (4 mg total) by mouth every 8 (eight) hours as needed for nausea or vomiting. Patient not taking: Reported on 05/03/2023 02/21/23   Radford Pax, NP    Family History Family History  Problem Relation Age of Onset   Heart disease Mother    Sudden death Mother    Stroke Mother    Hypertension Maternal Grandmother    Diabetes Maternal Grandmother    Heart disease Maternal Grandmother    Cancer Maternal Grandmother    Hypertension Paternal Grandmother    Diabetes Paternal Grandmother    Heart disease Paternal Grandmother     Social History Social History  Tobacco Use   Smoking status: Never   Smokeless tobacco: Never  Vaping Use   Vaping status: Never Used  Substance Use Topics   Alcohol use: Yes    Alcohol/week: 2.0 standard drinks of alcohol    Types: 2 Standard drinks or equivalent per week    Comment: occasional   Drug use: Not Currently     Allergies   Dilaudid [hydromorphone hcl]   Review of Systems Review of Systems Per HPI  Physical Exam Triage Vital Signs ED Triage Vitals  Encounter Vitals Group     BP 06/03/23 1126 130/81     Systolic BP Percentile --      Diastolic BP Percentile --      Pulse Rate 06/03/23 1126 84     Resp 06/03/23 1126 18     Temp 06/03/23 1126 (!) 97.5 F (36.4 C)     Temp Source 06/03/23 1126 Oral     SpO2 06/03/23 1126 98 %     Weight --      Height 06/03/23 1122 5\' 5"  (1.651 m)     Head Circumference --      Peak  Flow --      Pain Score 06/03/23 1120 5     Pain Loc --      Pain Education --      Exclude from Growth Chart --    No data found.  Updated Vital Signs BP 130/81 (BP Location: Right Arm)   Pulse 84   Temp (!) 97.5 F (36.4 C) (Oral)   Resp 18   Ht 5\' 5"  (1.651 m)   SpO2 98%   BMI 52.42 kg/m    Physical Exam Vitals and nursing note reviewed.  Constitutional:      General: She is not in acute distress. HENT:     Head:     Comments: 4 intact sutures over the left eyebrow. There is a scab. No swelling, erythema, drainage. Healing well.  Eyes:     Extraocular Movements: Extraocular movements intact.     Conjunctiva/sclera: Conjunctivae normal.     Pupils: Pupils are equal, round, and reactive to light.  Cardiovascular:     Rate and Rhythm: Normal rate and regular rhythm.  Pulmonary:     Effort: Pulmonary effort is normal.  Skin:    General: Skin is warm and dry.  Neurological:     Mental Status: She is alert and oriented to person, place, and time.     UC Treatments / Results  Labs (all labs ordered are listed, but only abnormal results are displayed) Labs Reviewed - No data to display  EKG  Radiology No results found.  Procedures Procedures (including critical care time)  Medications Ordered in UC Medications - No data to display  Initial Impression / Assessment and Plan / UC Course  I have reviewed the triage vital signs and the nursing notes.  Pertinent labs & imaging results that were available during my care of the patient were reviewed by me and considered in my medical decision making (see chart for details).  Patient was very nervous and reports stress about suture removal. All 4 sutures removed without issue by this provider. Area is healing well and reassurance provided.  Final Clinical Impressions(s) / UC Diagnoses   Final diagnoses:  Laceration of left eyebrow, subsequent encounter  Visit for suture removal   Discharge Instructions    None    ED Prescriptions   None    PDMP not reviewed this  encounter.   Geordie Nooney, Lurena Joiner, [place] 06/03/23 1201

## 2023-06-03 NOTE — ED Notes (Signed)
Lurena Joiner PA removed pt's sutures at this time.

## 2023-06-05 ENCOUNTER — Other Ambulatory Visit: Payer: Medicaid Other

## 2023-06-05 ENCOUNTER — Ambulatory Visit: Payer: Medicaid Other | Admitting: Infectious Diseases

## 2023-06-06 ENCOUNTER — Ambulatory Visit (INDEPENDENT_AMBULATORY_CARE_PROVIDER_SITE_OTHER): Payer: Medicaid Other | Admitting: Podiatry

## 2023-06-06 DIAGNOSIS — Z91199 Patient's noncompliance with other medical treatment and regimen due to unspecified reason: Secondary | ICD-10-CM

## 2023-06-06 NOTE — Progress Notes (Signed)
Patient was no-show for appointment today 

## 2023-06-24 ENCOUNTER — Telehealth: Payer: Self-pay | Admitting: Family

## 2023-06-24 NOTE — Telephone Encounter (Signed)
Rozelle called to rescheduled a missed appointment. Patient requested a different provider to be seen sooner. She is currently scheduled 1/9 with [facility]. She was upset with Greg's next available and requested to speak with him directly. She can be reached at [phone]

## 2023-06-25 ENCOUNTER — Other Ambulatory Visit: Payer: Self-pay

## 2023-06-25 ENCOUNTER — Other Ambulatory Visit: Payer: Self-pay | Admitting: Pharmacist

## 2023-06-25 ENCOUNTER — Other Ambulatory Visit (HOSPITAL_COMMUNITY): Payer: Self-pay

## 2023-06-25 ENCOUNTER — Other Ambulatory Visit: Payer: Self-pay | Admitting: Family

## 2023-06-25 DIAGNOSIS — B20 Human immunodeficiency virus [HIV] disease: Secondary | ICD-10-CM

## 2023-06-25 MED ORDER — SYMTUZA 800-150-200-10 MG PO TABS
1.0000 | ORAL_TABLET | Freq: Every day | ORAL | 3 refills | Status: DC
  Filled 2023-06-25: qty 30, 30d supply, fill #0
  Filled 2023-07-11: qty 30, 30d supply, fill #1
  Filled 2023-08-16 – 2023-09-23 (×3): qty 30, 30d supply, fill #2
  Filled 2023-10-18: qty 30, 30d supply, fill #3

## 2023-06-25 NOTE — Progress Notes (Signed)
 Spoke with Brenda Tyler via phone and has been having difficulty obtaining medication from the pharmacy and was informed there was a problem with her Medicaid. Per Pharmacy Team there was no issues with Medicaid that were noted and has coverage through Medicaid [employer]. Will send prescription to [facility] for her to fill. Advised to call back if unable to get medication.   Greg Duilio Heritage, NP 06/25/2023 12:05 PM

## 2023-06-25 NOTE — Progress Notes (Signed)
 Specialty Pharmacy Initial Fill Coordination Note  Brenda Tyler is a 37 y.o. female contacted today regarding initial fill of specialty medication(s) Darun-Cobic-Emtricit-TenofAF (Symtuza )   Patient requested [name] at [facility] at [place] date: 06/25/23   Medication will be filled on 06/25/23.   Patient is aware of $0 copayment.

## 2023-06-25 NOTE — Progress Notes (Signed)
 Specialty Pharmacy Initiation Note   Brenda Tyler is a 37 y.o. female who will be followed by the specialty pharmacy service for RxSp HIV    Review of administration, indication, effectiveness, safety, potential side effects, storage/disposable, and missed dose instructions occurred today for patient's specialty medication(s) Darun-Cobic-Emtricit-TenofAF (Symtuza )     Patient/Caregiver did not have any additional questions or concerns.   Patient's therapy is appropriate to: Initiate    Goals Addressed             This Visit's Progress    Comply with lab assessments   On track    Patient is on track. Patient will adhere to provider and/or lab appointments.      Maintain optimal adherence to therapy   On track    Patient is on track. Patient will work on increased adherence.      Minimize and address adverse drug events/drug interactions   On track    Patient is on track. Patient will be monitored by provider to determine if a change in treatment plan is warranted.         [facility] Specialty Pharmacist

## 2023-07-04 ENCOUNTER — Ambulatory Visit: Payer: Medicaid Other | Admitting: Internal Medicine

## 2023-07-11 ENCOUNTER — Other Ambulatory Visit (HOSPITAL_COMMUNITY): Payer: Self-pay

## 2023-07-11 NOTE — Progress Notes (Signed)
Specialty Pharmacy Refill Coordination Note  Brenda Tyler is a 38 y.o. female contacted today regarding refills of specialty medication(s) Darun-Cobic-Emtricit-TenofAF [name])   Patient requested [name] at [facility] at [place] date: 07/23/23   Medication will be filled on 07/22/23.

## 2023-07-22 ENCOUNTER — Other Ambulatory Visit: Payer: Self-pay

## 2023-07-24 ENCOUNTER — Ambulatory Visit
Admission: RE | Admit: 2023-07-24 | Discharge: 2023-07-24 | Disposition: A | Discharge status: Home / Self Care | Payer: Medicaid Other | Source: Ambulatory Visit | Attending: Family Medicine | Admitting: Family Medicine

## 2023-07-24 ENCOUNTER — Telehealth: Payer: Medicaid Other | Admitting: Physician Assistant

## 2023-07-24 VITALS — BP 131/87 | HR 68 | Temp 98.1°F | Resp 18

## 2023-07-24 DIAGNOSIS — R112 Nausea with vomiting, unspecified: Secondary | ICD-10-CM

## 2023-07-24 DIAGNOSIS — B349 Viral infection, unspecified: Secondary | ICD-10-CM

## 2023-07-24 MED ORDER — ONDANSETRON 4 MG PO TBDP
4.0000 mg | ORAL_TABLET | Freq: Once | ORAL | Status: AC
  Administered 2023-07-24: 4 mg via ORAL

## 2023-07-24 MED ORDER — PROMETHAZINE-DM 6.25-15 MG/5ML PO SYRP: 5.0000 mL | ORAL_SOLUTION | Freq: Four times a day (QID) | ORAL | 0 refills | Status: DC | PRN

## 2023-07-24 MED ORDER — ONDANSETRON 4 MG PO TBDP: 4.0000 mg | ORAL_TABLET | Freq: Three times a day (TID) | ORAL | 0 refills | Status: DC | PRN

## 2023-07-24 NOTE — Progress Notes (Signed)
The patient no-showed for appointment despite this provider sending direct link with no response and waiting for at least 10 minutes from appointment time for patient to join. They will be marked as a NS for this appointment/time.   Piedad Climes, PA-C

## 2023-07-24 NOTE — Discharge Instructions (Addendum)
You may take Zofran every 8 hours as needed for nausea or vomiting.  You were given a dose of this while you are in the clinic.  Promethazine DM as needed for cough, please of this medication will make you drowsy.  Do not drink alcohol or drive on this medication.  Lots of rest and fluids.  Please follow-up with your PCP if your symptoms do not improve.  Please go to the ER for any worsening symptoms.  I hope you feel better soon!

## 2023-07-24 NOTE — ED Provider Notes (Signed)
[facility]    CSN: [id number] Arrival date & time: 07/24/23  4098      History   Chief Complaint Chief Complaint  Patient presents with   Chills    I've been caught and throwing up - Entered by patient    HPI Brenda Tyler is a 38 y.o. female  presents for evaluation of URI symptoms for 3 days. Patient reports associated symptoms of cough, congestion, headache, nausea/vomiting, chills, body aches. Denies diarrhea, sore throat, fevers, ear pain, shortness of breath, hematochezia.  She is able to keep fluids down and did have some broth yesterday without issue.  Patient does not have a hx of asthma. Patient is not an active smoker.   Reports sick cough text via work.  Pt has taken NyQuil OTC for symptoms. Pt has no other concerns at this time.   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 - s/p Colpo 11/02/13 - CIN II -->    HIV (human immunodeficiency virus infection) (HCC)    HSV (herpes simplex virus) infection    Obesity    Oropharyngeal candidiasis 07/05/2014   Other fatigue    Shortness of breath on exertion     Patient Active Problem List   Diagnosis Date Noted   Irregular menstrual bleeding 05/03/2023   Breast pain 01/31/2023   Healthcare maintenance 01/31/2023   Pre-diabetes 11/02/2020   Vitamin D deficiency 11/02/2020   At risk for diabetes mellitus 11/02/2020   Acute intractable headache 02/23/2020   Dizziness and giddiness 02/23/2020   Cyst of bone of left hand 12/10/2019   Visit for routine gyn exam 07/30/2019   Desire for pregnancy 07/30/2019   Dislocation of knee, anterior, left, closed 01/21/2016   Closed anterior dislocation of left knee 01/21/2016   Depression 07/26/2014   Herpes labialis 07/03/2014   HIV disease (HCC) 04/23/2014   General counseling for  prescription of oral contraceptives 06/11/2013   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 Authorizing Provider  Darunavir-Cobicistat-Emtricitabine-Tenofovir Alafenamide (SYMTUZA) 800-150-200-10 MG TABS Take 1 tablet by mouth daily with breakfast. 06/25/23  Yes Veryl Speak, FNP  gabapentin (NEURONTIN) 300 MG capsule Take 1 capsule (300 mg total) by mouth 3 (three) times daily as needed (pain). 03/07/23   McDonald, Rachelle Hora, DPM  ondansetron (ZOFRAN-ODT) 4 MG disintegrating tablet Take 1 tablet (4 mg total) by mouth every 8 (eight) hours as needed for nausea or vomiting. 07/24/23  Yes Radford Pax, NP  promethazine-dextromethorphan (PROMETHAZINE-DM) 6.25-15 MG/5ML syrup Take 5  mLs by mouth 4 (four) times daily as needed for cough. 07/24/23  Yes Radford Pax, NP    Family History Family History  Problem Relation Age of Onset   Heart disease Mother    Sudden death Mother    Stroke Mother    Hypertension Maternal Grandmother    Diabetes Maternal Grandmother    Heart disease Maternal Grandmother    Cancer Maternal Grandmother    Hypertension Paternal Grandmother    Diabetes Paternal Grandmother    Heart disease Paternal Grandmother     Social History Social History   Tobacco Use   Smoking status: Never   Smokeless tobacco: Never  Vaping Use   Vaping status: Never Used  Substance Use Topics   Alcohol use: Yes    Alcohol/week: 2.0 standard drinks of alcohol    Types: 2 Standard drinks or equivalent per week    Comment: occasional   Drug use: Not  Currently     Allergies   Dilaudid [hydromorphone hcl]   Review of Systems Review of Systems  Constitutional:  Positive for chills.  HENT:  Positive for congestion.   Respiratory:  Positive for cough.   Gastrointestinal:  Positive for nausea and vomiting.  Musculoskeletal:  Positive for myalgias.     Physical Exam Triage Vital Signs ED Triage Vitals  Encounter Vitals Group     BP 07/24/23 1007 131/87     Systolic BP Percentile --      Diastolic BP Percentile --      Pulse Rate 07/24/23 1007 68     Resp 07/24/23 1007 18     Temp 07/24/23 1007 98.1 F (36.7 C)     Temp Source 07/24/23 1007 Oral     SpO2 07/24/23 1007 98 %     Weight --      Height --      Head Circumference --      Peak Flow --      Pain Score 07/24/23 1005 8     Pain Loc --      Pain Education --      Exclude from Growth Chart --    No data found.  Updated Vital Signs BP 131/87 (BP Location: Right Arm)   Pulse 68   Temp 98.1 F (36.7 C) (Oral)   Resp 18   SpO2 98%   Visual Acuity Right Eye Distance:   Left Eye Distance:   Bilateral Distance:    Right Eye Near:   Left Eye Near:    Bilateral Near:     Physical Exam Vitals and nursing note reviewed.  Constitutional:      General: She is not in acute distress.    Appearance: She is well-developed. She is not ill-appearing.  HENT:     Head: Normocephalic and atraumatic.     Right Ear: Tympanic membrane and ear canal normal.     Left Ear: Tympanic membrane and ear canal normal.     Nose: Congestion present.     Mouth/Throat:     Mouth: Mucous membranes are moist.     Pharynx: Oropharynx is clear. Uvula midline. No oropharyngeal exudate or posterior oropharyngeal erythema.     Tonsils: No tonsillar exudate or tonsillar abscesses.  Eyes:     Conjunctiva/sclera: Conjunctivae normal.     Pupils: Pupils are equal, round, and reactive to light.  Cardiovascular:     Rate and Rhythm: Normal rate and regular rhythm.     Heart sounds:  Normal heart sounds.  Pulmonary:     Effort: Pulmonary effort is normal.     Breath sounds: Normal breath sounds.  Abdominal:     General: Bowel sounds are normal. There is no distension.     Palpations: Abdomen is soft.     Tenderness: There is no abdominal tenderness. There is no guarding.  Musculoskeletal:     Cervical back: Normal range of motion and neck supple.  Lymphadenopathy:     Cervical: No cervical adenopathy.  Skin:    General: Skin is warm and dry.  Neurological:     General: No focal deficit present.     Mental Status: She is alert and oriented to person, place, and time.  Psychiatric:        Mood and Affect: Mood normal.        Behavior: Behavior normal.      UC Treatments / Results  Labs (all labs ordered are listed, but only abnormal results are displayed) Labs Reviewed - No data to display  EKG   Radiology No results found.  Procedures Procedures (including critical care time)  Medications Ordered in UC Medications  ondansetron (ZOFRAN-ODT) disintegrating tablet 4 mg (has no administration in time range)    Initial Impression / Assessment and Plan / UC Course  I have reviewed the triage vital signs and the nursing notes.  Pertinent labs & imaging results that were available during my care of the patient were reviewed by me and considered in my medical decision making (see chart for details).     Reviewed exam and symptoms with patient.  No red flags.  Discussed viral illness and symptomatic treatment.  Patient given Zofran in clinic and Rx sent to pharmacy.  Promethazine DM as needed for cough, side effect profile reviewed.  Discussed hydration/electrolyte replacement as well as brat diet.  PCP follow-up if symptoms do not improve.  ER precautions reviewed. Final Clinical Impressions(s) / UC Diagnoses   Final diagnoses:  Nausea and vomiting, unspecified vomiting type  Viral illness     Discharge Instructions      You may take Zofran  every 8 hours as needed for nausea or vomiting.  You were given a dose of this while you are in the clinic.  Promethazine DM as needed for cough, please of this medication will make you drowsy.  Do not drink alcohol or drive on this medication.  Lots of rest and fluids.  Please follow-up with your PCP if your symptoms do not improve.  Please go to the ER for any worsening symptoms.  I hope you feel better soon!    ED Prescriptions     Medication Sig Dispense Auth. Provider   ondansetron (ZOFRAN-ODT) 4 MG disintegrating tablet Take 1 tablet (4 mg total) by mouth every 8 (eight) hours as needed for nausea or vomiting. 10 tablet Radford Pax, NP   promethazine-dextromethorphan (PROMETHAZINE-DM) 6.25-15 MG/5ML syrup Take 5 mLs by mouth 4 (four) times daily as needed for cough. 118 mL Radford Pax, NP      PDMP not reviewed this encounter.   Radford Pax, NP 07/24/23 1050

## 2023-07-24 NOTE — ED Triage Notes (Signed)
Pt presents to UC for c/o headache, chills, vomiting x3 days. Took cough medicine.

## 2023-07-29 ENCOUNTER — Other Ambulatory Visit: Payer: Self-pay

## 2023-07-29 ENCOUNTER — Ambulatory Visit
Admission: RE | Admit: 2023-07-29 | Discharge: 2023-07-29 | Disposition: A | Discharge status: Home / Self Care | Payer: Medicaid Other | Source: Ambulatory Visit | Attending: Family | Admitting: Family

## 2023-07-29 ENCOUNTER — Ambulatory Visit: Payer: Medicaid Other

## 2023-07-29 DIAGNOSIS — N644 Mastodynia: Secondary | ICD-10-CM

## 2023-08-16 ENCOUNTER — Other Ambulatory Visit: Payer: Self-pay

## 2023-08-16 NOTE — Progress Notes (Signed)
Specialty Pharmacy Refill Coordination Note  Brenda Tyler is a 38 y.o. female contacted today regarding refills of specialty medication(s) Darun-Cobic-Emtricit-TenofAF [name])   Patient requested [name] at [facility] at [place] date: 08/27/23   Medication will be filled on 03.03.25.

## 2023-08-26 ENCOUNTER — Other Ambulatory Visit: Payer: Self-pay

## 2023-08-27 ENCOUNTER — Other Ambulatory Visit (HOSPITAL_COMMUNITY)
Admission: RE | Admit: 2023-08-27 | Discharge: 2023-08-27 | Disposition: A | Discharge status: Home / Self Care | Source: Ambulatory Visit | Attending: Obstetrics and Gynecology | Admitting: Obstetrics and Gynecology

## 2023-08-27 ENCOUNTER — Encounter: Payer: Self-pay | Admitting: Obstetrics and Gynecology

## 2023-08-27 ENCOUNTER — Ambulatory Visit: Payer: Medicaid Other | Admitting: Obstetrics and Gynecology

## 2023-08-27 ENCOUNTER — Other Ambulatory Visit: Payer: Self-pay

## 2023-08-27 VITALS — BP 119/86 | HR 76 | Wt 314.0 lb

## 2023-08-27 DIAGNOSIS — N898 Other specified noninflammatory disorders of vagina: Secondary | ICD-10-CM | POA: Insufficient documentation

## 2023-08-27 DIAGNOSIS — E2839 Other primary ovarian failure: Secondary | ICD-10-CM | POA: Diagnosis not present

## 2023-08-27 DIAGNOSIS — N760 Acute vaginitis: Secondary | ICD-10-CM | POA: Diagnosis not present

## 2023-08-27 DIAGNOSIS — N950 Postmenopausal bleeding: Secondary | ICD-10-CM | POA: Insufficient documentation

## 2023-08-27 DIAGNOSIS — B9689 Other specified bacterial agents as the cause of diseases classified elsewhere: Secondary | ICD-10-CM

## 2023-08-27 DIAGNOSIS — Z3202 Encounter for pregnancy test, result negative: Secondary | ICD-10-CM

## 2023-08-27 LAB — POCT PREGNANCY, URINE: Preg Test, Ur: NEGATIVE

## 2023-08-27 MED ORDER — IBUPROFEN 800 MG PO TABS
800.0000 mg | ORAL_TABLET | Freq: Once | ORAL | Status: AC
  Administered 2023-08-27: 800 mg via ORAL

## 2023-08-27 NOTE — Progress Notes (Signed)
 GYNECOLOGY OFFICE VISIT NOTE  History:   Brenda Tyler is a 38 y.o. [phone] here today for irregular bleeding. She bleeds about once a year. She had wanted to get pregnant but then was told she was perimenopausal. When she bleeds she bleeds like it is a period.   She notes vaginal odor but no discharge or irritation. She does have a history of HIV, but last viral load was undetectable.   She was never told why she went into early menopause/perimenopause. She denies history of postpartum hemorrhage. Her youngest child is 106 yo.     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 - s/p Colpo 11/02/13 - CIN II -->    HIV (human immunodeficiency virus infection) (HCC)    HSV (herpes simplex virus) infection    Obesity    Oropharyngeal candidiasis 07/05/2014   Other fatigue    Shortness of breath on exertion     Past Surgical History:  Procedure Laterality Date   ANKLE SURGERY Right    INCISION AND DRAINAGE ABSCESS     on abdomen and buttocks    The following portions of the patient's history were reviewed and updated as appropriate: allergies, current medications, past family history, past medical history, past social history, past surgical history and problem list.   Health Maintenance:   Normal pap:  Diagnosis  Date Value Ref Range Status  10/17/2020   Final   - Negative for intraepithelial lesion or malignancy (NILM)     Normal mammogram on 07/29/2023.   Review of Systems:  Pertinent items noted in HPI and remainder of comprehensive ROS otherwise negative.  Physical Exam:  BP 119/86   Pulse 76   Wt (!) 314 lb (142.4 kg)   BMI 52.25 kg/m  CONSTITUTIONAL: Well-developed, well-nourished female in no acute distress.  HEENT:  Normocephalic, atraumatic. External right and left ear normal. No  scleral icterus.  NECK: Normal range of motion, supple, no masses noted on observation SKIN: No rash noted. Not diaphoretic. No erythema. No pallor. MUSCULOSKELETAL: Normal range of motion. No edema noted. NEUROLOGIC: Alert and oriented to person, place, and time. Normal muscle tone coordination. No cranial nerve deficit noted. PSYCHIATRIC: Normal mood and affect. Normal behavior. Normal judgment and thought content.  PELVIC: Normal appearing external genitalia; normal urethral meatus; normal appearing vaginal mucosa and cervix.  No abnormal discharge noted.  Normal uterine size, no other palpable masses, no uterine or adnexal tenderness. Performed in the presence of a chaperone  Labs and Imaging Results for orders placed or performed in visit on 08/27/23 (from the past week)  Pregnancy, urine POC   Collection Time: 08/27/23  9:56 AM  Result Value Ref Range   Preg Test, Ur NEGATIVE NEGATIVE   MM 3D DIAGNOSTIC MAMMOGRAM BILATERAL BREAST Result Date: 07/29/2023 CLINICAL DATA:  Patient complains of diffuse, intermittent bilateral breast pain. EXAM: DIGITAL DIAGNOSTIC BILATERAL MAMMOGRAM WITH TOMOSYNTHESIS AND CAD TECHNIQUE: Bilateral digital diagnostic mammography and breast tomosynthesis was performed. The images were evaluated with computer-aided detection. COMPARISON:  None available. ACR Breast Density Category b: There are scattered areas of fibroglandular density. FINDINGS: No suspicious mass, malignant type microcalcifications or distortion detected in either breast. IMPRESSION: No evidence of malignancy in either breast. RECOMMENDATION: If the clinical exam remains benign/stable screening mammography  can be deferred until the age of 89. I have discussed the findings and recommendations with the patient. If applicable, a reminder letter will be sent to the patient regarding the next appointment. BI-RADS CATEGORY  1: Negative. Electronically Signed   By: Baird Lyons M.D.   On: 07/29/2023 10:03        GYNECOLOGY OFFICE PROCEDURE NOTE   ENDOMETRIAL BIOPSY     The indications for endometrial biopsy were reviewed.   Risks of the biopsy including cramping, bleeding, infection, uterine perforation, inadequate specimen and need for additional procedures were discussed. Offered alternative of hysteroscopy, dilation and curettage in OR. The patient states she understands the R/B/I/A and agrees to undergo procedure today. Urine pregnancy test was Negative. Consent was signed. Time out was performed.    Patient was positioned in dorsal lithotomy position. A vaginal speculum was placed.  Cervix sprayed with hurricane spray. The cervix was visualized and was prepped with Betadine.  A single-toothed tenaculum was placed on the anterior lip of the cervix to stabilize it. The 3 mm pipelle was easily introduced into the endometrial cavity without difficulty to a depth of 6.5 cm, and a Scant amount of tissue was obtained after two passes and sent to pathology. The instruments were removed from the patient's vagina. Minimal bleeding from the cervix was noted. The patient tolerated the procedure well.     Assessment and Plan:  Brenda Tyler was seen today for abnormal uterine bleeding.  Diagnoses and all orders for this visit:  Postmenopausal bleeding Due to scant tissue, also recommend TVUS in addition to EMB to check for polyp, etc.  EMB done today, but again scant tissue despite multiple passes.  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 If other labs are normal and Horizon is negative, would send to endocrinologist for additional work up for POI.  -     CBC -     TSH Rfx on Abnormal to Free T4 [facility] -     HORIZON Basic Panel  Vaginal odor Has a history of BV, but will await culture.  -     Cervicovaginal ancillary only( [place])  Other  orders -     Pregnancy, urine POC    Meds ordered this encounter  Medications   ibuprofen (ADVIL) tablet 800 mg     Routine preventative health maintenance measures emphasized. Please refer to After Visit Summary for other counseling recommendations.   Return in about 1 month (around 09/27/2023) for Follow up w/Allene Furuya.  Milas Hock, MD, FACOG Obstetrician & Gynecologist, [facility] for [facility], [facility]

## 2023-08-28 ENCOUNTER — Encounter: Payer: Self-pay | Admitting: Obstetrics and Gynecology

## 2023-08-28 LAB — CBC
Hematocrit: 40.7 % (ref 34.0–46.6)
Hemoglobin: 13.4 g/dL (ref 11.1–15.9)
MCH: 29.3 pg (ref 26.6–33.0)
MCHC: 32.9 g/dL (ref 31.5–35.7)
MCV: 89 fL (ref 79–97)
Platelets: 317 10*3/uL (ref 150–450)
RBC: 4.57 x10E6/uL (ref 3.77–5.28)
RDW: 12.5 % (ref 11.7–15.4)
WBC: 5 10*3/uL (ref 3.4–10.8)

## 2023-08-28 LAB — CERVICOVAGINAL ANCILLARY ONLY
Bacterial Vaginitis (gardnerella): POSITIVE — AB
Candida Glabrata: NEGATIVE
Candida Vaginitis: NEGATIVE
Chlamydia: NEGATIVE
Comment: NEGATIVE
Comment: NEGATIVE
Comment: NEGATIVE
Comment: NEGATIVE
Comment: NEGATIVE
Comment: NORMAL
Neisseria Gonorrhea: NEGATIVE
Trichomonas: NEGATIVE

## 2023-08-28 LAB — TSH RFX ON ABNORMAL TO FREE T4: TSH: 1.55 u[IU]/mL (ref 0.450–4.500)

## 2023-08-28 LAB — FOLLICLE STIMULATING HORMONE: FSH: 64 m[IU]/mL

## 2023-08-28 MED ORDER — METRONIDAZOLE 500 MG PO TABS: 500.0000 mg | ORAL_TABLET | Freq: Two times a day (BID) | ORAL | 0 refills | Status: DC

## 2023-08-28 NOTE — Addendum Note (Signed)
 Addended by: Milas Hock A on: 08/28/2023 08:44 PM   Modules accepted: Orders

## 2023-08-29 ENCOUNTER — Encounter: Payer: Self-pay | Admitting: Obstetrics and Gynecology

## 2023-08-29 LAB — SURGICAL PATHOLOGY

## 2023-08-30 LAB — CYTOLOGY - PAP
Comment: NEGATIVE
Comment: NEGATIVE
Comment: NEGATIVE
Diagnosis: NEGATIVE
HPV 16: NEGATIVE
HPV 18 / 45: NEGATIVE
High risk HPV: POSITIVE — AB

## 2023-09-02 ENCOUNTER — Ambulatory Visit (HOSPITAL_COMMUNITY)
Admission: RE | Admit: 2023-09-02 | Discharge: 2023-09-02 | Disposition: A | Discharge status: Home / Self Care | Source: Ambulatory Visit | Attending: Obstetrics and Gynecology | Admitting: Obstetrics and Gynecology

## 2023-09-02 ENCOUNTER — Other Ambulatory Visit (HOSPITAL_COMMUNITY)

## 2023-09-02 DIAGNOSIS — N950 Postmenopausal bleeding: Secondary | ICD-10-CM | POA: Insufficient documentation

## 2023-09-02 DIAGNOSIS — R140 Abdominal distension (gaseous): Secondary | ICD-10-CM | POA: Diagnosis not present

## 2023-09-04 ENCOUNTER — Encounter: Payer: Self-pay | Admitting: Obstetrics and Gynecology

## 2023-09-07 LAB — HORIZON CUSTOM: REPORT SUMMARY: POSITIVE — AB

## 2023-09-09 ENCOUNTER — Other Ambulatory Visit (HOSPITAL_COMMUNITY): Payer: Self-pay

## 2023-09-11 ENCOUNTER — Encounter: Payer: Self-pay | Admitting: *Deleted

## 2023-09-11 ENCOUNTER — Telehealth: Payer: Self-pay | Admitting: *Deleted

## 2023-09-11 NOTE — Telephone Encounter (Signed)
-----   Message from Milas Hock sent at 09/09/2023  8:11 AM EDT ----- I needed this Horizon to be run with Fragile X and the incorrect one was ordered (I thought I ordered the right one). Can you call [employer] and have them run Fragile X on this patient?  Thanks, pad

## 2023-09-11 NOTE — Telephone Encounter (Signed)
 I called [employer] and requested additional test. They informed me they can add test but will need a signed form.They will send form by fax for me to complete and fax back.  Nancy Fetter

## 2023-09-11 NOTE — Telephone Encounter (Signed)
 Received form, completed and faxed. Nancy Fetter

## 2023-09-13 ENCOUNTER — Other Ambulatory Visit: Payer: Self-pay

## 2023-09-13 NOTE — Progress Notes (Signed)
 Patient never picked up, and has a call 3/25. Returned to [employer].

## 2023-09-17 ENCOUNTER — Other Ambulatory Visit (HOSPITAL_COMMUNITY): Payer: Self-pay

## 2023-09-18 ENCOUNTER — Encounter: Payer: Self-pay | Admitting: Obstetrics and Gynecology

## 2023-09-19 ENCOUNTER — Other Ambulatory Visit (HOSPITAL_COMMUNITY): Payer: Self-pay

## 2023-09-23 ENCOUNTER — Other Ambulatory Visit: Payer: Self-pay

## 2023-09-23 NOTE — Progress Notes (Signed)
 Specialty Pharmacy Refill Coordination Note  Brenda Tyler is a 38 y.o. female contacted today regarding refills of specialty medication(s) Darun-Cobic-Emtricit-TenofAF [name])   Patient requested [name] at [facility] at [place] date: 09/23/23   Medication will be filled on 03.31.25.

## 2023-09-26 NOTE — Progress Notes (Unsigned)
 GYNECOLOGY OFFICE VISIT NOTE  History:   Brenda Tyler is a 38 y.o. [phone] here today for follow up for PMB, abnormal pap smear and POI.    The following portions of the patient's history were reviewed and updated as appropriate: allergies, current medications, past family history, past medical history, past social history, past surgical history and problem list.   Health Maintenance:   Normal pap but HPV positive. (Non 16/18) Diagnosis  Date Value Ref Range Status  08/27/2023   Final   - Negative for intraepithelial lesion or malignancy (NILM)     Review of Systems:  Pertinent items noted in HPI and remainder of comprehensive ROS otherwise negative.  Physical Exam:  BP 107/74 (BP Location: Left Arm, Patient Position: Sitting, Cuff Size: Large)   Pulse 78   Ht 5\' 5"  (1.651 m)   Wt (!) 311 lb 8 oz (141.3 kg)   SpO2 99%   BMI 51.84 kg/m  CONSTITUTIONAL: Well-developed, well-nourished female in no acute distress.  HEENT:  Normocephalic, atraumatic. External right and left ear normal. No scleral icterus.  NECK: Normal range of motion, supple, no masses noted on observation SKIN: No rash noted. Not diaphoretic. No erythema. No pallor. MUSCULOSKELETAL: Normal range of motion. No edema noted. NEUROLOGIC: Alert and oriented to person, place, and time. Normal muscle tone coordination. No cranial nerve deficit noted. PSYCHIATRIC: Normal mood and affect. Normal behavior. Normal judgment and thought content.  Labs and Imaging No results found for this or any previous visit (from the past week). US PELVIC COMPLETE WITH TRANSVAGINAL Result Date: 09/17/2023 CLINICAL DATA:  pmb. EXAM: TRANSABDOMINAL AND TRANSVAGINAL ULTRASOUND OF PELVIS TECHNIQUE: Both transabdominal and transvaginal ultrasound examinations of the pelvis were performed. Transabdominal technique was performed for global imaging of the pelvis including uterus, ovaries, adnexal regions, and pelvic cul-de-sac. It was  necessary to proceed with endovaginal exam following the transabdominal exam to visualize the uterus and endometrium. COMPARISON:  None Available. FINDINGS: The technologist noted reduced resolution due to patient's body habitus/bowel gas. The Uterus Measurements: 2.5 x 3.7 x 4.7 cm. = volume: 22.8 mL. No fibroids or other mass visualized. Endometrium Thickness: 6.3 mm.  No focal abnormality visualized. Right ovary Right ovary is not visualized. No large adnexal mass seen on the provided images. Left ovary Left ovary is not visualized. No large adnexal mass seen on the provided images. Other findings No abnormal free fluid. IMPRESSION: *Unremarkable uterus. *Nonvisualization of bilateral ovaries. No large adnexal mass seen on the provided images. Electronically Signed   By: Jules Schick M.D.   On: 09/17/2023 19:06      GYNECOLOGY OFFICE COLPOSCOPY PROCEDURE NOTE  38 y.o. [id number] here for colposcopy for normal pap/HPV pos.   Pregnancy test:  not indicated  Informed consent and review of risks, benefit and alternatives performed. Written consent given.   Speculum inserted into patient's vagina assuring full view of cervix and vaginal walls. 3 swabs of vinegar solution applied to the cervix and vaginal walls and colposcope was used to observe both the cervix and vaginal walls.   Colposcopy adequate? No - unable to complete colposcopy due to patient burning with vinegar and with numbing spray. Pt requested end of procedure so procedure ended before completion.  Speculum removed.   Assessment and Plan:   1. Postmenopausal bleeding (Primary) EL thickened at 6.3 mm. Repeat EMB done as prior was insufficient.  Discussed in office procedure vs D&C, hysteroscopy in the OR.  She requests OR.  Discussed r/b/a.  Amb ref for surg scheduling sent.   2. 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 Has 3 sons- 2 with development delays. Recommend peds genetics with Dr. Roetta Sessions.  Referral placed.  Discussed not able to conceive spontaneously anymore - only conception would be with REI I.e. donor embryo.   3. Abnormal cervical Papanicolaou smear, unspecified abnormal pap finding Colposcopy unable to be completed d/t patient discomfort. She would like this in the OR too.  Anticipate cotesting in one year.  She is s/p Gardasil    Routine preventative health maintenance measures emphasized. Please refer to After Visit Summary for other counseling recommendations.   No follow-ups on file.  Milas Hock, MD, FACOG Obstetrician & Gynecologist, [facility] for [facility], [facility]

## 2023-09-27 ENCOUNTER — Encounter: Payer: Self-pay | Admitting: Obstetrics and Gynecology

## 2023-09-27 ENCOUNTER — Other Ambulatory Visit: Payer: Self-pay

## 2023-09-27 ENCOUNTER — Encounter: Payer: Self-pay | Admitting: Family Medicine

## 2023-09-27 ENCOUNTER — Ambulatory Visit: Admitting: Obstetrics and Gynecology

## 2023-09-27 VITALS — BP 107/74 | HR 78 | Ht 65.0 in | Wt 311.5 lb

## 2023-09-27 DIAGNOSIS — N950 Postmenopausal bleeding: Secondary | ICD-10-CM

## 2023-09-27 DIAGNOSIS — R87619 Unspecified abnormal cytological findings in specimens from cervix uteri: Secondary | ICD-10-CM | POA: Diagnosis not present

## 2023-09-27 DIAGNOSIS — E2839 Other primary ovarian failure: Secondary | ICD-10-CM | POA: Diagnosis not present

## 2023-09-28 ENCOUNTER — Other Ambulatory Visit (HOSPITAL_COMMUNITY): Payer: Self-pay

## 2023-10-01 ENCOUNTER — Encounter: Payer: Self-pay | Admitting: Obstetrics and Gynecology

## 2023-10-01 DIAGNOSIS — Z148 Genetic carrier of other disease: Secondary | ICD-10-CM | POA: Insufficient documentation

## 2023-10-08 ENCOUNTER — Ambulatory Visit
Admission: RE | Admit: 2023-10-08 | Discharge: 2023-10-08 | Disposition: A | Discharge status: Home / Self Care | Source: Ambulatory Visit | Attending: Nurse Practitioner | Admitting: Nurse Practitioner

## 2023-10-08 VITALS — BP 121/84 | HR 71 | Temp 98.5°F | Resp 16

## 2023-10-08 DIAGNOSIS — J302 Other seasonal allergic rhinitis: Secondary | ICD-10-CM | POA: Diagnosis not present

## 2023-10-08 DIAGNOSIS — K0889 Other specified disorders of teeth and supporting structures: Secondary | ICD-10-CM

## 2023-10-08 MED ORDER — CHLORPHEN-PE-ACETAMINOPHEN 4-10-325 MG PO TABS: 1.0000 | ORAL_TABLET | Freq: Three times a day (TID) | ORAL | 0 refills | Status: DC

## 2023-10-08 MED ORDER — AMOXICILLIN 875 MG PO TABS: 875.0000 mg | ORAL_TABLET | Freq: Two times a day (BID) | ORAL | 0 refills | Status: DC

## 2023-10-08 MED ORDER — KETOROLAC TROMETHAMINE 60 MG/2ML IM SOLN
60.0000 mg | Freq: Once | INTRAMUSCULAR | Status: AC
  Administered 2023-10-08: 60 mg via INTRAMUSCULAR

## 2023-10-08 NOTE — Discharge Instructions (Addendum)
 You were seen today for dental pain and seasonal allergies. It is very important that you see a dentist as soon as possible to fully treat the problem. Take the antibiotics exactly as prescribed. Brush your teeth twice a day. Rinse your mouth with water after eating to help keep the area clean. Avoid any foods or drinks that make the pain worse, and stick to soft foods while your mouth is healing. Do not use straws or smoke, as this can slow the healing process.  You were seen today for seasonal allergies. Your symptoms are likely caused by pollen or other allergens in the environment. Take any prescribed allergy medications as directed. Use over-the-counter nasal sprays and antihistamines to help with congestion, sneezing, and itchy eyes. Try to stay indoors on high pollen days and keep windows closed. Shower after being outside to remove pollen from your skin and hair. If symptoms get worse or do not improve, follow up with your primary care provider.

## 2023-10-08 NOTE — ED Triage Notes (Signed)
 Pt present with lt side facial pain and dental pain X 2 wks. Pt states she has seasonal allergies and it has worsened.  Home interventions: benadryl

## 2023-10-08 NOTE — ED Provider Notes (Signed)
 [facility]    CSN: [id number] Arrival date & time: 10/08/23  1115      History   Chief Complaint Chief Complaint  Patient presents with   Dental Pain    HPI Brenda Tyler is a 38 y.o. female.   Brenda Tyler is a 38 year old female presents with a complaint of toothache localized to the left upper molar, ongoing for the past two weeks. She also reports pain on the entire left side of her face, worsened by movement and use. She denies any swelling, sore throat, or difficulty opening her mouth. The patient has not been evaluated by another provider but states her dentist's office is currently closed for vacation.    In addition, she reports allergy symptoms over the past couple of weeks, including runny nose, itchy throat, and swelling and itchiness of the eyes. She has been taking Benadryl without relief. For the tooth and facial pain, she has been taking a variety of over-the-counter medications, including aspirin, ibuprofen, BC powders, and Tylenol.  The following portions of the patient's history were reviewed and updated as appropriate: allergies, current medications, past family history, past medical history, past social history, past surgical history, and problem list.         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   Irregular menstrual bleeding 05/03/2023   Breast pain 01/31/2023   Healthcare maintenance 01/31/2023   Pre-diabetes 11/02/2020    Vitamin D deficiency 11/02/2020   At risk for diabetes mellitus 11/02/2020   Acute intractable headache 02/23/2020   Dizziness and giddiness 02/23/2020   Cyst of bone of left hand 12/10/2019   Visit for routine gyn exam 07/30/2019   Desire for pregnancy 07/30/2019   Dislocation of knee, anterior, left, closed 01/21/2016   Closed anterior dislocation of left knee 01/21/2016   Depression 07/26/2014   Herpes labialis 07/03/2014   HIV disease (HCC) 04/23/2014   General counseling for prescription of oral contraceptives 06/11/2013   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 Authorizing Provider  amoxicillin (AMOXIL) 875 MG tablet Take 1 tablet (875 mg total) by mouth 2 (two)  times daily for 7 days. 10/08/23 10/15/23 Yes Maryruth Sol, FNP  Chlorphen-PE-Acetaminophen 4-10-325 MG TABS Take 1 tablet by mouth 3 (three) times daily. 10/08/23  Yes Maryruth Sol, FNP  Darunavir-Cobicistat-Emtricitabine-Tenofovir Alafenamide (SYMTUZA) 800-150-200-10 MG TABS Take 1 tablet by mouth daily with breakfast. 06/25/23   Bubba Carbo, FNP    Family History Family History  Problem Relation Age of Onset   Heart disease Mother    Sudden death Mother    Stroke Mother    Hypertension Maternal Grandmother    Diabetes Maternal Grandmother    Heart disease Maternal Grandmother    Cancer Maternal Grandmother    Hypertension Paternal Grandmother    Diabetes Paternal Grandmother    Heart disease Paternal Grandmother     Social History Social History   Tobacco  Use   Smoking status: Never   Smokeless tobacco: Never  Vaping Use   Vaping status: Never Used  Substance Use Topics   Alcohol use: Yes    Alcohol/week: 2.0 standard drinks of alcohol    Types: 2 Standard drinks or equivalent per week    Comment: occasional   Drug use: Not Currently     Allergies   Dilaudid [hydromorphone hcl]   Review of Systems Review of Systems  Constitutional:  Negative for chills, fatigue and fever.  HENT:  Positive for dental problem and rhinorrhea. Negative for facial swelling, sore throat and trouble swallowing.   Eyes:  Positive for itching.  Respiratory:  Negative for cough.   Gastrointestinal:  Negative for nausea and vomiting.  Musculoskeletal:  Negative for myalgias.  Neurological:  Positive for headaches.  All other systems reviewed and are negative.    Physical Exam Triage Vital Signs ED Triage Vitals  Encounter Vitals Group     BP 10/08/23 1136 121/84     Systolic BP Percentile --      Diastolic BP Percentile --      Pulse Rate 10/08/23 1136 71     Resp 10/08/23 1136 16     Temp 10/08/23 1136 98.5 F (36.9 C)     Temp Source 10/08/23 1136 Oral     SpO2 10/08/23 1136 97 %     Weight --      Height --      Head Circumference --      Peak Flow --      Pain Score 10/08/23 1135 9     Pain Loc --      Pain Education --      Exclude from Growth Chart --    No data found.  Updated Vital Signs BP 121/84 (BP Location: Right Arm)   Pulse 71   Temp 98.5 F (36.9 C) (Oral)   Resp 16   SpO2 97%   Visual Acuity Right Eye Distance:   Left Eye Distance:   Bilateral Distance:    Right Eye Near:   Left Eye Near:    Bilateral Near:     Physical Exam Vitals reviewed.  Constitutional:      General: She is not in acute distress.    Appearance: Normal appearance. She is well-developed. She is morbidly obese. She is not ill-appearing, toxic-appearing or diaphoretic.  HENT:     Head: Normocephalic.     Mouth/Throat:     Lips:  Pink. No lesions.     Mouth: Mucous membranes are moist.     Dentition: Dental tenderness present. No gingival swelling, dental abscesses or gum lesions.     Tongue: No lesions.  Pharynx: Oropharynx is clear. Uvula midline. No pharyngeal swelling or posterior oropharyngeal erythema.   Eyes:     Conjunctiva/sclera: Conjunctivae normal.  Cardiovascular:     Rate and Rhythm: Normal rate and regular rhythm.     Heart sounds: Normal heart sounds.  Pulmonary:     Effort: Pulmonary effort is normal.     Breath sounds: Normal breath sounds.  Musculoskeletal:        General: Normal range of motion.  Skin:    General: Skin is warm and dry.  Neurological:     General: No focal deficit present.     Mental Status: She is alert and oriented to person, place, and time.      UC Treatments / Results  Labs (all labs ordered are listed, but only abnormal results are displayed) Labs Reviewed - No data to display  EKG   Radiology No results found.  Procedures Procedures (including critical care time)  Medications Ordered in UC Medications  ketorolac (TORADOL) injection 60 mg (60 mg Intramuscular Given 10/08/23 1219)    Initial Impression / Assessment and Plan / UC Course  I have reviewed the triage vital signs and the nursing notes.  Pertinent labs & imaging results that were available during my care of the patient were reviewed by me and considered in my medical decision making (see chart for details).    38 year old female presents with dental pain and worsening allergy symptoms over the past couple of weeks. She is afebrile and nontoxic. Physical exam is as noted, with pain localized to the left upper molar where minimal decay is observed. The tooth is intact with no signs of gingival swelling or abscess. No other focal findings noted. Amoxicillin and Norel AD were prescribed. Supportive care measures were discussed. The patient was advised to follow up with her dentist as soon as  possible for further evaluation and treatment, and to follow up with her primary care provider if her allergy symptoms do not improve.  Today's evaluation has revealed no signs of a dangerous process. Discussed diagnosis with patient and/or guardian. Patient and/or guardian aware of their diagnosis, possible red flag symptoms to watch out for and need for close follow up. Patient and/or guardian understands verbal and written discharge instructions. Patient and/or guardian comfortable with plan and disposition.  Patient and/or guardian has a clear mental status at this time, good insight into illness (after discussion and teaching) and has clear judgment to make decisions regarding their care  Documentation was completed with the aid of voice recognition software. Transcription may contain typographical errors. Final Clinical Impressions(s) / UC Diagnoses   Final diagnoses:  Pain, dental  Seasonal allergies     Discharge Instructions      You were seen today for dental pain and seasonal allergies. It is very important that you see a dentist as soon as possible to fully treat the problem. Take the antibiotics exactly as prescribed. Brush your teeth twice a day. Rinse your mouth with water after eating to help keep the area clean. Avoid any foods or drinks that make the pain worse, and stick to soft foods while your mouth is healing. Do not use straws or smoke, as this can slow the healing process.  You were seen today for seasonal allergies. Your symptoms are likely caused by pollen or other allergens in the environment. Take any prescribed allergy medications as directed. Use over-the-counter nasal sprays and antihistamines to help with congestion, sneezing, and itchy eyes. Try to stay indoors  on high pollen days and keep windows closed. Shower after being outside to remove pollen from your skin and hair. If symptoms get worse or do not improve, follow up with your primary care  provider.       ED Prescriptions     Medication Sig Dispense Auth. Provider   amoxicillin (AMOXIL) 875 MG tablet Take 1 tablet (875 mg total) by mouth 2 (two) times daily for 7 days. 14 tablet Maryruth Sol, FNP   Chlorphen-PE-Acetaminophen 4-10-325 MG TABS Take 1 tablet by mouth 3 (three) times daily. 21 tablet Maryruth Sol, FNP      PDMP not reviewed this encounter.   Maryruth Sol, [place] 10/08/23 1252

## 2023-10-09 ENCOUNTER — Telehealth: Payer: Self-pay | Admitting: Lactation Services

## 2023-10-09 NOTE — Telephone Encounter (Signed)
 Returned call to patient. She reports she was calling to let Dr. Vallarie Gauze know that she started bleeding dark brown blood on Sunday. She was having severe cramps also. She is still bleeding and changing her pads a few times a day, it is mostly when she wipes herself. She is still having cramping and had to go home from work today. She reports she has not taken any Ibuprofen. She has taken Tylenol and did not help. She reports the pain increases as she is up and moving around.    She is planning to start antibiotics for a tooth infection, she has not started.   She reports she is awaiting surgery to be scheduled.

## 2023-10-10 ENCOUNTER — Ambulatory Visit: Payer: Self-pay

## 2023-10-10 ENCOUNTER — Encounter: Payer: Self-pay | Admitting: Lactation Services

## 2023-10-14 ENCOUNTER — Other Ambulatory Visit: Payer: Self-pay

## 2023-10-14 ENCOUNTER — Ambulatory Visit: Payer: Self-pay

## 2023-10-15 ENCOUNTER — Ambulatory Visit
Admission: RE | Admit: 2023-10-15 | Discharge: 2023-10-15 | Disposition: A | Discharge status: Home / Self Care | Source: Ambulatory Visit | Attending: Family Medicine | Admitting: Family Medicine

## 2023-10-15 ENCOUNTER — Telehealth: Payer: Self-pay | Admitting: Lactation Services

## 2023-10-15 ENCOUNTER — Telehealth: Payer: Self-pay

## 2023-10-15 VITALS — BP 117/82 | HR 79 | Temp 98.2°F | Resp 17 | Ht 65.0 in | Wt 315.0 lb

## 2023-10-15 DIAGNOSIS — B349 Viral infection, unspecified: Secondary | ICD-10-CM | POA: Diagnosis not present

## 2023-10-15 LAB — POCT URINALYSIS DIP (MANUAL ENTRY)
Bilirubin, UA: NEGATIVE
Blood, UA: NEGATIVE
Glucose, UA: NEGATIVE mg/dL
Ketones, POC UA: NEGATIVE mg/dL
Leukocytes, UA: NEGATIVE
Nitrite, UA: NEGATIVE
Protein Ur, POC: NEGATIVE mg/dL
Spec Grav, UA: 1.015 (ref 1.010–1.025)
Urobilinogen, UA: 0.2 U/dL
pH, UA: 7.5 (ref 5.0–8.0)

## 2023-10-15 MED ORDER — CETIRIZINE HCL 10 MG PO TABS: 10.0000 mg | ORAL_TABLET | Freq: Every day | ORAL | 0 refills | Status: DC

## 2023-10-15 MED ORDER — PROMETHAZINE-DM 6.25-15 MG/5ML PO SYRP: 2.5000 mL | ORAL_SOLUTION | Freq: Three times a day (TID) | ORAL | 0 refills | Status: DC | PRN

## 2023-10-15 MED ORDER — LOPERAMIDE HCL 2 MG PO CAPS: 2.0000 mg | ORAL_CAPSULE | Freq: Two times a day (BID) | ORAL | 0 refills | Status: DC | PRN

## 2023-10-15 MED ORDER — ONDANSETRON 8 MG PO TBDP: 8.0000 mg | ORAL_TABLET | Freq: Three times a day (TID) | ORAL | 0 refills | Status: DC | PRN

## 2023-10-15 MED ORDER — ACETAMINOPHEN 325 MG PO TABS: 650.0000 mg | ORAL_TABLET | Freq: Four times a day (QID) | ORAL | 0 refills | Status: DC | PRN

## 2023-10-15 MED ORDER — PSEUDOEPHEDRINE HCL 30 MG PO TABS: 30.0000 mg | ORAL_TABLET | Freq: Three times a day (TID) | ORAL | 0 refills | Status: DC | PRN

## 2023-10-15 NOTE — Discharge Instructions (Signed)
 We will manage this as a viral syndrome. For sore throat or cough try using a honey-based tea. Use 3 teaspoons of honey with juice squeezed from half lemon. Place shaved pieces of ginger into 1/2-1 cup of water and warm over stove top. Then mix the ingredients and repeat every 4 hours as needed. Please take Tylenol  500mg -650mg  once every 6 hours for fevers, aches and pains. Hydrate very well with at least 2 liters (64 ounces) of water. Eat light meals such as soups (chicken and noodles, chicken wild rice, vegetable).  Do not eat any foods that you are allergic to.  Start an antihistamine like Zyrtec  (10mg  daily) for postnasal drainage, sinus congestion.  You can take this together with pseudoephedrine  (Sudafed) at a dose of 30 mg 3 times a day or twice daily as needed for the same kind of congestion.  Use cough syrup as needed. Make sure you push fluids drinking mostly water but mix it with Gatorade.  Try to eat light meals including soups, broths and soft foods, fruits.  You may use Zofran  for your nausea and vomiting once every 8 hours.  Imodium  can help with diarrhea but use this carefully limiting it to 1-2 times per day only if you are having a lot of diarrhea.

## 2023-10-15 NOTE — ED Triage Notes (Signed)
 Pt states that she has some dizziness, diarrhea, headache, and abdominal pain.x3-4 days

## 2023-10-15 NOTE — Telephone Encounter (Signed)
 Patient called requesting FMLA paper be filled out this week.  Patient stated she needs FMLA for feeling fatigue and sometimes she doesn't want to go to work.  Patient advised this is not something we typically fill out FMLA paperwork for and she could discuss this with her pcp. Patient scheduled for follow up as well.  Brenda Tyler, CMA

## 2023-10-15 NOTE — Telephone Encounter (Signed)
 Returned call to patient. She reports she had some bleeding that was real brown last week. The bleeding has now stopped. She reports she is having severe abdominal pain "Like labor pains". She has had to call in to work a lot. She may need FMLA paperwork filled out to save her job.  Reviewed FMLA will save her job but usually does not pay. Reviewed she will need to use some FMLA for her surgery.

## 2023-10-15 NOTE — ED Provider Notes (Signed)
 [facility]  Note:  This document was prepared using Conservation officer, historic buildings and may include unintentional dictation errors.  MRN: [id number] DOB: 1986-01-21  Subjective:   Brenda Tyler is a 38 y.o. female with past medical history of HIV presenting for 3-4 day history of congestion, coughing, cold sweats, diarrhea, dizziness, headaches, belly pain, body aches, malaise and fatigue.  No shortness of breath, wheezing, bloody stools.  No nausea or vomiting.  No fever, recent antibiotic use, hospitalizations or long distance travel.  Has not eaten raw foods, drank unfiltered water.  No history of GI disorders including Crohn's, IBS, ulcerative colitis.   No current facility-administered medications for this encounter.  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 - s/p Colpo 11/02/13 - CIN II -->    HIV (human immunodeficiency virus infection) (HCC)    HSV (herpes simplex virus) infection    Obesity    Oropharyngeal candidiasis 07/05/2014   Other fatigue    Shortness of breath on exertion      Past Surgical History:  Procedure Laterality Date   ANKLE SURGERY Right    INCISION AND DRAINAGE ABSCESS     on abdomen and buttocks    Family History  Problem Relation Age of Onset   Heart disease Mother    Sudden death Mother    Stroke Mother    Hypertension Maternal Grandmother    Diabetes Maternal Grandmother    Heart disease Maternal Grandmother    Cancer Maternal  Grandmother    Hypertension Paternal Grandmother    Diabetes Paternal Grandmother    Heart disease Paternal Grandmother     Social History   Tobacco Use   Smoking status: Never   Smokeless tobacco: Never  Vaping Use   Vaping status: Never Used  Substance Use Topics   Alcohol use: Not Currently    Alcohol/week: 2.0 standard drinks of alcohol    Types: 2 Standard drinks or equivalent per week    Comment: occasional   Drug use: Not Currently    ROS   Objective:   Vitals: BP 117/82 (BP Location: Left Arm)   Pulse 79   Temp 98.2 F (36.8 C) (Oral)   Resp 17   Ht 5\' 5"  (1.651 m)   Wt (!) 315 lb (142.9 kg)   SpO2 97%   BMI 52.42 kg/m   Physical Exam Constitutional:      General: She is not in acute distress.    Appearance: Normal appearance. She is well-developed and normal weight. She is not ill-appearing, toxic-appearing or diaphoretic.  HENT:     Head: Normocephalic and atraumatic.     Right Ear: Tympanic membrane, ear canal and external ear normal. No drainage or tenderness. No middle ear effusion. There is no impacted cerumen. Tympanic membrane is not erythematous or bulging.     Left Ear: Tympanic membrane, ear canal and external ear normal. No drainage or tenderness.  No middle ear effusion. There is no impacted cerumen. Tympanic  membrane is not erythematous or bulging.     Nose: Congestion present. No rhinorrhea.     Mouth/Throat:     Mouth: Mucous membranes are moist. No oral lesions.     Pharynx: No pharyngeal swelling, oropharyngeal exudate, posterior oropharyngeal erythema or uvula swelling.     Tonsils: No tonsillar exudate or tonsillar abscesses.  Eyes:     General: No scleral icterus.       Right eye: No discharge.        Left eye: No discharge.     Extraocular Movements: Extraocular movements intact.     Right eye: Normal extraocular motion.     Left eye: Normal extraocular motion.     Conjunctiva/sclera: Conjunctivae normal.  Cardiovascular:      Rate and Rhythm: Normal rate and regular rhythm.     Heart sounds: Normal heart sounds. No murmur heard.    No friction rub. No gallop.  Pulmonary:     Effort: Pulmonary effort is normal. No respiratory distress.     Breath sounds: No stridor. No wheezing, rhonchi or rales.  Chest:     Chest wall: No tenderness.  Abdominal:     General: Bowel sounds are normal. There is no distension.     Palpations: Abdomen is soft. There is no mass.     Tenderness: There is no abdominal tenderness. There is no right CVA tenderness, left CVA tenderness, guarding or rebound.  Musculoskeletal:     Cervical back: Normal range of motion and neck supple.  Lymphadenopathy:     Cervical: No cervical adenopathy.  Skin:    General: Skin is warm and dry.  Neurological:     General: No focal deficit present.     Mental Status: She is alert and oriented to person, place, and time.     Cranial Nerves: No cranial nerve deficit.     Motor: No weakness.     Coordination: Coordination normal.     Gait: Gait normal.  Psychiatric:        Mood and Affect: Mood normal.        Behavior: Behavior normal.        Thought Content: Thought content normal.        Judgment: Judgment normal.     Results for orders placed or performed during the hospital encounter of 10/15/23 (from the past 24 hours)  POCT urinalysis dipstick     Status: None   Collection Time: 10/15/23 11:47 AM  Result Value Ref Range   Color, UA yellow yellow   Clarity, UA clear clear   Glucose, UA negative negative mg/dL   Bilirubin, UA negative negative   Ketones, POC UA negative negative mg/dL   Spec Grav, UA 9.604 5.409 - 1.025   Blood, UA negative negative   pH, UA 7.5 5.0 - 8.0   Protein Ur, POC negative negative mg/dL   Urobilinogen, UA 0.2 0.2 or 1.0 E.U./dL   Nitrite, UA Negative Negative   Leukocytes, UA Negative Negative    Assessment and Plan :   PDMP not reviewed this encounter.  1. Acute viral syndrome    Patient refused  testing.  No signs of an acute abdomen, acute cardiopulmonary event.  Suspect viral URI, viral syndrome. Physical exam findings reassuring and vital signs stable for discharge. Advised supportive care, offered symptomatic relief. Counseled patient on potential for adverse effects with medications prescribed/recommended today, ER and return-to-clinic precautions discussed, patient verbalized understanding.     Adolph Hoop, PA-C 10/15/23 1329

## 2023-10-17 ENCOUNTER — Encounter: Payer: Self-pay | Admitting: Lactation Services

## 2023-10-17 ENCOUNTER — Encounter: Payer: Self-pay | Admitting: Obstetrics and Gynecology

## 2023-10-18 ENCOUNTER — Other Ambulatory Visit: Payer: Self-pay | Admitting: Pharmacy Technician

## 2023-10-18 ENCOUNTER — Other Ambulatory Visit: Payer: Self-pay

## 2023-10-18 NOTE — Progress Notes (Signed)
 Specialty Pharmacy Refill Coordination Note  Brenda Tyler is a 38 y.o. female contacted today regarding refills of specialty medication(s) Darun-Cobic-Emtricit-TenofAF (Symtuza )   Patient requested [facility] at [facility] at [place] date: 10/21/23   Medication will be filled on 10/18/23.

## 2023-10-23 DIAGNOSIS — F32A Depression, unspecified: Secondary | ICD-10-CM | POA: Diagnosis not present

## 2023-10-23 DIAGNOSIS — F419 Anxiety disorder, unspecified: Secondary | ICD-10-CM | POA: Diagnosis not present

## 2023-10-24 ENCOUNTER — Encounter (HOSPITAL_COMMUNITY): Payer: Self-pay | Admitting: Obstetrics and Gynecology

## 2023-10-24 ENCOUNTER — Other Ambulatory Visit: Payer: Self-pay

## 2023-10-24 NOTE — Progress Notes (Signed)
 SDW CALL  Patient was given pre-op instructions over the phone. The opportunity was given for the patient to ask questions. No further questions asked. Patient verbalized understanding of instructions given.   PCP - denies Cardiologist - denies  PPM/ICD - denies   Chest x-ray - denies EKG - denies Stress Test - denies ECHO - denies Cardiac Cath - denies  Sleep Study - denies  No DM  Last dose of GLP1 agonist-  n/a GLP1 instructions:  n/a  Blood Thinner Instructions: n/a Aspirin  Instructions:  n/a  ERAS Protcol -NPO   COVID TEST-  n/a   Anesthesia review:  no  Patient denies shortness of breath, fever, cough and chest pain over the phone call   All instructions explained to the patient, with a verbal understanding of the material. Patient agrees to go over the instructions while at home for a better understanding.

## 2023-10-29 ENCOUNTER — Ambulatory Visit (HOSPITAL_BASED_OUTPATIENT_CLINIC_OR_DEPARTMENT_OTHER): Admitting: Anesthesiology

## 2023-10-29 ENCOUNTER — Ambulatory Visit (HOSPITAL_COMMUNITY): Admitting: Anesthesiology

## 2023-10-29 ENCOUNTER — Other Ambulatory Visit: Payer: Self-pay

## 2023-10-29 ENCOUNTER — Ambulatory Visit (HOSPITAL_COMMUNITY)
Admission: RE | Admit: 2023-10-29 | Discharge: 2023-10-29 | Disposition: A | Discharge status: Home / Self Care | Attending: Obstetrics and Gynecology | Admitting: Obstetrics and Gynecology

## 2023-10-29 ENCOUNTER — Encounter (HOSPITAL_COMMUNITY)
Admission: RE | Disposition: A | Discharge status: Home / Self Care | Payer: Self-pay | Source: Home / Self Care | Attending: Obstetrics and Gynecology

## 2023-10-29 DIAGNOSIS — E6689 Other obesity not elsewhere classified: Secondary | ICD-10-CM | POA: Insufficient documentation

## 2023-10-29 DIAGNOSIS — K219 Gastro-esophageal reflux disease without esophagitis: Secondary | ICD-10-CM | POA: Insufficient documentation

## 2023-10-29 DIAGNOSIS — Z6841 Body Mass Index (BMI) 40.0 and over, adult: Secondary | ICD-10-CM | POA: Insufficient documentation

## 2023-10-29 DIAGNOSIS — R87810 Cervical high risk human papillomavirus (HPV) DNA test positive: Secondary | ICD-10-CM | POA: Diagnosis present

## 2023-10-29 DIAGNOSIS — N950 Postmenopausal bleeding: Secondary | ICD-10-CM | POA: Diagnosis present

## 2023-10-29 DIAGNOSIS — R87619 Unspecified abnormal cytological findings in specimens from cervix uteri: Secondary | ICD-10-CM | POA: Diagnosis present

## 2023-10-29 DIAGNOSIS — F418 Other specified anxiety disorders: Secondary | ICD-10-CM

## 2023-10-29 DIAGNOSIS — Z79899 Other long term (current) drug therapy: Secondary | ICD-10-CM | POA: Diagnosis not present

## 2023-10-29 DIAGNOSIS — Z21 Asymptomatic human immunodeficiency virus [HIV] infection status: Secondary | ICD-10-CM | POA: Diagnosis not present

## 2023-10-29 DIAGNOSIS — N858 Other specified noninflammatory disorders of uterus: Secondary | ICD-10-CM | POA: Diagnosis not present

## 2023-10-29 HISTORY — PX: HYSTEROSCOPY WITH D & C: SHX1775

## 2023-10-29 HISTORY — PX: COLPOSCOPY: SHX161

## 2023-10-29 LAB — CBC
HCT: 40 % (ref 36.0–46.0)
Hemoglobin: 12.6 g/dL (ref 12.0–15.0)
MCH: 29 pg (ref 26.0–34.0)
MCHC: 31.5 g/dL (ref 30.0–36.0)
MCV: 92 fL (ref 80.0–100.0)
Platelets: 294 10*3/uL (ref 150–400)
RBC: 4.35 MIL/uL (ref 3.87–5.11)
RDW: 13.3 % (ref 11.5–15.5)
WBC: 3.7 10*3/uL — ABNORMAL LOW (ref 4.0–10.5)
nRBC: 0 % (ref 0.0–0.2)

## 2023-10-29 LAB — TYPE AND SCREEN
ABO/RH(D): B POS
Antibody Screen: NEGATIVE

## 2023-10-29 LAB — POCT PREGNANCY, URINE: Preg Test, Ur: NEGATIVE

## 2023-10-29 SURGERY — DILATATION AND CURETTAGE /HYSTEROSCOPY
Anesthesia: General | Site: Vagina

## 2023-10-29 MED ORDER — ORAL CARE MOUTH RINSE: 15.0000 mL | Freq: Once | OROMUCOSAL | Status: AC

## 2023-10-29 MED ORDER — PROPOFOL 10 MG/ML IV BOLUS
INTRAVENOUS | Status: AC
  Filled 2023-10-29: qty 20

## 2023-10-29 MED ORDER — OXYCODONE HCL 5 MG PO TABS
5.0000 mg | ORAL_TABLET | Freq: Once | ORAL | Status: AC | PRN
  Administered 2023-10-29: 5 mg via ORAL

## 2023-10-29 MED ORDER — CHLOROPROCAINE HCL 1 % IJ SOLN
INTRAMUSCULAR | Status: AC
  Filled 2023-10-29: qty 30

## 2023-10-29 MED ORDER — FENTANYL CITRATE (PF) 100 MCG/2ML IJ SOLN
INTRAMUSCULAR | Status: AC
Start: 2023-10-29 — End: 2023-10-29
  Filled 2023-10-29: qty 2

## 2023-10-29 MED ORDER — IODINE STRONG (LUGOLS) 5 % PO SOLN
ORAL | Status: DC | PRN
  Administered 2023-10-29: .2 mL via ORAL

## 2023-10-29 MED ORDER — ONDANSETRON HCL 4 MG/2ML IJ SOLN
INTRAMUSCULAR | Status: AC
  Filled 2023-10-29: qty 2

## 2023-10-29 MED ORDER — ACETAMINOPHEN 10 MG/ML IV SOLN: 1000.0000 mg | Freq: Once | INTRAVENOUS | Status: DC | PRN

## 2023-10-29 MED ORDER — FENTANYL CITRATE (PF) 100 MCG/2ML IJ SOLN
INTRAMUSCULAR | Status: AC
  Administered 2023-10-29: 50 ug via INTRAVENOUS
  Filled 2023-10-29: qty 2

## 2023-10-29 MED ORDER — CHLORHEXIDINE GLUCONATE 0.12 % MT SOLN: 15.0000 mL | Freq: Once | OROMUCOSAL | Status: AC

## 2023-10-29 MED ORDER — MIDAZOLAM HCL 2 MG/2ML IJ SOLN
INTRAMUSCULAR | Status: DC | PRN
  Administered 2023-10-29: 2 mg via INTRAVENOUS

## 2023-10-29 MED ORDER — CHLORHEXIDINE GLUCONATE 0.12 % MT SOLN
OROMUCOSAL | Status: AC
  Administered 2023-10-29: 15 mL via OROMUCOSAL
  Filled 2023-10-29: qty 15

## 2023-10-29 MED ORDER — ONDANSETRON HCL 4 MG/2ML IJ SOLN: 4.0000 mg | Freq: Once | INTRAMUSCULAR | Status: DC | PRN

## 2023-10-29 MED ORDER — ACETAMINOPHEN 500 MG PO TABS
1000.0000 mg | ORAL_TABLET | ORAL | Status: AC
Start: 2023-10-29 — End: 2023-10-29
  Administered 2023-10-29: 1000 mg via ORAL
  Filled 2023-10-29: qty 2

## 2023-10-29 MED ORDER — MONSELS FERRIC SUBSULFATE EX SOLN
CUTANEOUS | Status: DC | PRN
  Administered 2023-10-29: 1 via TOPICAL

## 2023-10-29 MED ORDER — OXYCODONE HCL 5 MG PO TABS
ORAL_TABLET | ORAL | Status: AC
  Filled 2023-10-29: qty 1

## 2023-10-29 MED ORDER — IBUPROFEN 800 MG PO TABS: 800.0000 mg | ORAL_TABLET | Freq: Three times a day (TID) | ORAL | 0 refills | Status: DC | PRN

## 2023-10-29 MED ORDER — MIDAZOLAM HCL 2 MG/2ML IJ SOLN
INTRAMUSCULAR | Status: AC
  Filled 2023-10-29: qty 2

## 2023-10-29 MED ORDER — MONSELS FERRIC SUBSULFATE EX SOLN
CUTANEOUS | Status: AC
  Filled 2023-10-29: qty 8

## 2023-10-29 MED ORDER — LIDOCAINE 2% (20 MG/ML) 5 ML SYRINGE
INTRAMUSCULAR | Status: DC | PRN
  Administered 2023-10-29: 100 mg via INTRAVENOUS

## 2023-10-29 MED ORDER — DEXAMETHASONE SODIUM PHOSPHATE 10 MG/ML IJ SOLN
INTRAMUSCULAR | Status: DC | PRN
  Administered 2023-10-29: 10 mg via INTRAVENOUS

## 2023-10-29 MED ORDER — POVIDONE-IODINE 10 % EX SWAB
2.0000 | Freq: Once | CUTANEOUS | Status: AC
  Administered 2023-10-29: 2 via TOPICAL

## 2023-10-29 MED ORDER — PROPOFOL 10 MG/ML IV BOLUS
INTRAVENOUS | Status: DC | PRN
Start: 2023-10-29 — End: 2023-10-29
  Administered 2023-10-29: 200 mg via INTRAVENOUS

## 2023-10-29 MED ORDER — IODINE STRONG (LUGOLS) 5 % PO SOLN
ORAL | Status: AC
  Filled 2023-10-29: qty 1

## 2023-10-29 MED ORDER — LACTATED RINGERS IV SOLN: INTRAVENOUS | Status: DC

## 2023-10-29 MED ORDER — DEXAMETHASONE SODIUM PHOSPHATE 10 MG/ML IJ SOLN
INTRAMUSCULAR | Status: AC
  Filled 2023-10-29: qty 1

## 2023-10-29 MED ORDER — OXYCODONE HCL 5 MG/5ML PO SOLN: 5.0000 mg | Freq: Once | ORAL | Status: AC | PRN

## 2023-10-29 MED ORDER — FENTANYL CITRATE (PF) 100 MCG/2ML IJ SOLN: 25.0000 ug | INTRAMUSCULAR | Status: DC | PRN

## 2023-10-29 MED ORDER — FENTANYL CITRATE (PF) 250 MCG/5ML IJ SOLN
INTRAMUSCULAR | Status: DC | PRN
  Administered 2023-10-29: 100 ug via INTRAVENOUS

## 2023-10-29 MED ORDER — ONDANSETRON HCL 4 MG/2ML IJ SOLN
INTRAMUSCULAR | Status: DC | PRN
  Administered 2023-10-29: 4 mg via INTRAVENOUS

## 2023-10-29 MED ORDER — LIDOCAINE 2% (20 MG/ML) 5 ML SYRINGE
INTRAMUSCULAR | Status: AC
  Filled 2023-10-29: qty 5

## 2023-10-29 SURGICAL SUPPLY — 24 items
APPLICATOR COTTON TIP 6 STRL (MISCELLANEOUS) IMPLANT
APPLICATOR COTTON TIP 6IN STRL (MISCELLANEOUS) IMPLANT
BLADE SURG 11 STRL SS (BLADE) ×1 IMPLANT
CATH ROBINSON RED A/P 16FR (CATHETERS) ×1 IMPLANT
ELECT BALL LEEP 3MM BLK (ELECTRODE) IMPLANT
ELECT BALL LEEP 5MM RED (ELECTRODE) IMPLANT
ELECTRODE REM PT RTRN 9FT ADLT (ELECTROSURGICAL) IMPLANT
GAUZE 4X4 16PLY ~~LOC~~+RFID DBL (SPONGE) IMPLANT
GLOVE BIO SURGEON STRL SZ 6 (GLOVE) ×1 IMPLANT
GLOVE SURG UNDER POLY LF SZ7 (GLOVE) ×1 IMPLANT
GOWN STRL REUS W/ TWL LRG LVL3 (GOWN DISPOSABLE) ×2 IMPLANT
KIT PROCEDURE FLUENT (KITS) ×1 IMPLANT
KIT TURNOVER KIT B (KITS) ×1 IMPLANT
NS IRRIG 1000ML POUR BTL (IV SOLUTION) ×1 IMPLANT
PACK VAGINAL MINOR WOMEN LF (CUSTOM PROCEDURE TRAY) ×1 IMPLANT
PACK VAGINAL WOMENS (CUSTOM PROCEDURE TRAY) ×1 IMPLANT
PAD OB MATERNITY 11 LF (PERSONAL CARE ITEMS) ×1 IMPLANT
SCOPETTES 8 STERILE (MISCELLANEOUS) IMPLANT
SPONGE SURGIFOAM ABS GEL 12-7 (HEMOSTASIS) IMPLANT
SUT SILK 2 0 30 PSL (SUTURE) IMPLANT
SUT VIC AB 0 CT1 27XBRD ANBCTR (SUTURE) IMPLANT
SWAB AQUA AP2 (CLEANER/DISINFECTANT) ×1 IMPLANT
SWAB AQUA CLEANREAD (CLEANER/DISINFECTANT) IMPLANT
TOWEL GREEN STERILE FF (TOWEL DISPOSABLE) ×2 IMPLANT

## 2023-10-29 NOTE — H&P (Signed)
 [facility] Attending History and Physical  Brenda Tyler is a 38 y.o. [phone] who presented to [facility] OR today for evaluation of abnormal pap and PMB. She had an EMB done in the office but insufficient tissue. I did an US  and it showed EL was 6.3 mm. She had a normal sized uterus measuring about 5 cm.   She continues to have ongoing PMB that is brown in color. 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   She had an abnormal pap of normal but HPV positive (non 16/18). Her pap history is: Gardasil 2/3 2014: HSIL >Colpo CIN1 2015: LSIL HPV pos in 2018 09/2020 - pap wnl 08/2023: Normal pap/HPV pos>Colpo.   Of note, she is HIV positive.    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 - s/p Colpo 11/02/13 - CIN II -->    HIV (human immunodeficiency virus infection) (HCC)    HSV (herpes simplex virus) infection    Obesity    Oropharyngeal candidiasis 07/05/2014   Other fatigue    Shortness of breath on exertion    Past Surgical History:  Procedure Laterality Date   ANKLE SURGERY Right    INCISION AND DRAINAGE ABSCESS     on abdomen and buttocks   OB History  Gravida Para Term Preterm AB Living  4 3 3  1 3   SAB IAB Ectopic Multiple Live Births   1       # Outcome Date GA Lbr Len/2nd Weight Sex Type Anes PTL Lv  4 Term 09/09/08     Vag-Spont     3 Term 01/10/06     Vag-Spont     2 Term 03/09/02     Vag-Spont     1 IAB           Patient denies any other pertinent gynecologic issues.  No current facility-administered medications on file prior to encounter.   Current Outpatient Medications on File Prior to Encounter  Medication Sig Dispense Refill   Darunavir -Cobicistat-Emtricitabine-Tenofovir Alafenamide (SYMTUZA ) 800-150-200-10 MG TABS Take 1 tablet by mouth  daily with breakfast. 30 tablet 3   acetaminophen  (TYLENOL ) 325 MG tablet Take 2 tablets (650 mg total) by mouth every 6 (six) hours as needed for moderate pain (pain score 4-6). (Patient not taking: Reported on 10/22/2023) 30 tablet 0   cetirizine  (ZYRTEC  ALLERGY) 10 MG tablet Take 1 tablet (10 mg total) by mouth daily. (Patient not taking: Reported on 10/22/2023) 30 tablet 0   loperamide  (IMODIUM ) 2 MG capsule Take 1 capsule (2 mg total) by mouth 2 (two) times daily as needed for diarrhea or loose stools. (Patient not taking: Reported on 10/22/2023) 14 capsule 0   ondansetron  (ZOFRAN -ODT) 8 MG disintegrating tablet Take 1 tablet (8 mg total) by mouth every 8 (eight) hours as needed for nausea or vomiting. (Patient not taking: Reported on 10/22/2023) 20 tablet 0   promethazine -dextromethorphan (PROMETHAZINE -DM) 6.25-15 MG/5ML syrup Take 2.5 mLs by mouth 3 (three) times daily as needed for cough. (Patient not taking: Reported on 10/22/2023) 100 mL 0   pseudoephedrine  (SUDAFED) 30 MG tablet Take 1 tablet (30 mg total) by mouth every 8 (eight) hours as needed for congestion. (Patient not taking: Reported on 10/22/2023) 30 tablet 0   Allergies  Allergen Reactions   Dilaudid  [Hydromorphone  Hcl] Other (See Comments)    Makes her dizzy    Social History:   reports that she has never smoked. She has never used smokeless tobacco. She reports that she does not currently use alcohol after a past usage of about 2.0 standard drinks of alcohol per week. She reports that she does not currently use drugs. Family History  Problem Relation Age of Onset   Heart disease Mother    Sudden death Mother    Stroke Mother    Hypertension Maternal Grandmother    Diabetes Maternal Grandmother    Heart disease Maternal Grandmother    Cancer Maternal Grandmother    Hypertension Paternal Grandmother    Diabetes Paternal Grandmother    Heart disease Paternal Grandmother     Review of Systems: Pertinent items noted in HPI  and remainder of comprehensive ROS otherwise negative.  PHYSICAL EXAM: There were no vitals taken for this visit. CONSTITUTIONAL: Well-developed, well-nourished female in no acute distress.  HENT:  Normocephalic, atraumatic, External right and left ear normal. Oropharynx is clear and moist EYES: Conjunctivae and EOM are normal. Pupils are equal, round, and reactive to light. No scleral icterus.  NECK: Normal range of motion, supple, no masses SKIN: Skin is warm and dry. No rash noted. Not diaphoretic. No erythema. No pallor. NEUROLOGIC: Alert and oriented to person, place, and time. Normal reflexes, muscle tone coordination. No cranial nerve deficit noted. PSYCHIATRIC: Normal mood and affect. Normal behavior. Normal judgment and thought content. CARDIOVASCULAR: Normal heart rate noted, regular rhythm RESPIRATORY: Effort and breath sounds normal, no problems with respiration noted ABDOMEN: Soft, nontender, nondistended. PELVIC: Not examined MUSCULOSKELETAL: Normal range of motion. No tenderness.  No cyanosis, clubbing, or edema.  2+ distal pulses.  Labs: No results found for this or any previous visit (from the past 2 weeks).  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 Postmenopausal bleeding (Primary) EL thickened at 6.3 mm. Prior EMB was insufficient.  Discussed in office procedure vs D&C, hysteroscopy in the OR.  She requests OR.  Discussed r/b/a in the office. Consent reviewed and signed.    2. Abnormal cervical Papanicolaou smear, unspecified abnormal pap finding Colposcopy unable to be completed in the office d/t patient discomfort. Will do in the OR.  Anticipate cotesting in one year.  She is s/p Gardasil    Lacey Pian, MD, FACOG Obstetrician & Gynecologist, [facility] for [facility], [facility]

## 2023-10-29 NOTE — Op Note (Signed)
 Preop: PMB, HPV positive on pap smear Postop: Same Procedure: D&C, Hysteroscopy, colposcopy with cervical biopsies and ECC Surgeon: Dr. Vallarie Gauze Assist: None EBL 5 cc IVF: 500 cc  UOP: None - not drained Specimens: 1. EMC 2. ECC 3. Cervical biopsies (random) at 3, 6, 9 and 12 Findings: Normal appearing EFG. Normal appearing cervix. Normal ostia visualized bilaterally. Thin endometrium noted and uterine cavity small - c/w US . No cavitary defects. On colposcopy, Lugol's applied and no changes c/w CIN.   Description of the procedure: Preop antibiotics not indicated. Informed consent reviewed and signed. Pt given opportunity to ask questions.   Pt prepped and draped in the dorsal lithotomy fashion after LMA anesthesia found to be adequate. Timeout performed.   Open sided speculum placed into the patient's vagina. Single tooth tenaculum applied to the 12 o'clock position of the cervix. Uterus sounded to 5.  Cervix progressively dilated to a 6 Hagar. 30 degree hysteroscope was inserted into the cavity with the aforementioned findings noted. EMC performed.   Tenaculum removed and Lugol's applied. Colposcopy performed and no changes c/w CIN. ECC performed. Random cervical biopsies done at 3, 6, 9 and 12 oclock at the squamocolumnar junction. Monsel's applied.   Hemostatic at the end of the procedure. Procedure completed. All instruments removed. Counts correct x2.  Pt taken to recovery room in stable condition.  Lacey Pian, MD Attending Obstetrician & Gynecologist, [facility] for [facility], [facility]

## 2023-10-29 NOTE — Anesthesia Preprocedure Evaluation (Addendum)
 Anesthesia Evaluation  Patient identified by MRN, date of birth, ID band Patient awake    Reviewed: Allergy & Precautions, NPO status , Patient's Chart, lab work & pertinent test results, reviewed documented beta blocker date and time   History of Anesthesia Complications Negative for: history of anesthetic complications  Airway Mallampati: II  TM Distance: >3 FB     Dental no notable dental hx.    Pulmonary neg COPD   breath sounds clear to auscultation + decreased breath sounds      Cardiovascular negative cardio ROS  Rhythm:Regular Rate:Normal     Neuro/Psych  Headaches, neg Seizures PSYCHIATRIC DISORDERS Anxiety Depression       GI/Hepatic ,GERD  Medicated and Controlled,,(+) neg Cirrhosis        Endo/Other    Class 4 obesity  Renal/GU Renal disease     Musculoskeletal   Abdominal   Peds  Hematology   Anesthesia Other Findings   Reproductive/Obstetrics                              Anesthesia Physical Anesthesia Plan  ASA: 3  Anesthesia Plan: General   Post-op Pain Management:    Induction: Intravenous  PONV Risk Score and Plan: 2 and Ondansetron  and Dexamethasone  Airway Management Planned: LMA  Additional Equipment:   Intra-op Plan:   Post-operative Plan: Extubation in OR  Informed Consent: I have reviewed the patients History and Physical, chart, labs and discussed the procedure including the risks, benefits and alternatives for the proposed anesthesia with the patient or authorized representative who has indicated his/her understanding and acceptance.     Dental advisory given  Plan Discussed with: CRNA  Anesthesia Plan Comments:          Anesthesia Quick Evaluation

## 2023-10-29 NOTE — Anesthesia Procedure Notes (Signed)
 Procedure Name: LMA Insertion Date/Time: 10/29/2023 2:15 PM  Performed by: Trenton Frock, CRNAPre-anesthesia Checklist: Patient identified, Emergency Drugs available, Suction available and Patient being monitored Patient Re-evaluated:Patient Re-evaluated prior to induction Oxygen Delivery Method: Circle System Utilized Preoxygenation: Pre-oxygenation with 100% oxygen Induction Type: IV induction Ventilation: Mask ventilation without difficulty LMA: LMA inserted LMA Size: 4.0 Tube type: Oral Number of attempts: 1 Airway Equipment and Method: Bite block Placement Confirmation: positive ETCO2 Tube secured with: Tape Dental Injury: Teeth and Oropharynx as per pre-operative assessment

## 2023-10-29 NOTE — Discharge Instructions (Signed)

## 2023-10-29 NOTE — Transfer of Care (Signed)
 Immediate Anesthesia Transfer of Care Note  Patient: Brenda Tyler  Procedure(s) Performed: DILATATION AND CURETTAGE /HYSTEROSCOPY (Vagina ) COLPOSCOPY (Vagina )  Patient Location: PACU  Anesthesia Type:General  Level of Consciousness: awake and patient cooperative  Airway & Oxygen Therapy: Patient Spontanous Breathing  Post-op Assessment: Report given to RN and Post -op Vital signs reviewed and stable  Post vital signs: Reviewed and stable  Last Vitals:  Vitals Value Taken Time  BP    Temp    Pulse    Resp    SpO2      Last Pain:  Vitals:   10/29/23 1253  TempSrc: Oral  PainSc: 0-No pain         Complications: No notable events documented.

## 2023-10-30 ENCOUNTER — Encounter (HOSPITAL_COMMUNITY): Payer: Self-pay | Admitting: Obstetrics and Gynecology

## 2023-10-30 NOTE — Anesthesia Postprocedure Evaluation (Signed)
 Anesthesia Post Note  Patient: Brenda Tyler  Procedure(s) Performed: DILATATION AND CURETTAGE /HYSTEROSCOPY (Vagina ) COLPOSCOPY (Vagina )     Patient location during evaluation: PACU Anesthesia Type: General Level of consciousness: awake and alert Pain management: pain level controlled Vital Signs Assessment: post-procedure vital signs reviewed and stable Respiratory status: spontaneous breathing, nonlabored ventilation, respiratory function stable and patient connected to nasal cannula oxygen Cardiovascular status: blood pressure returned to baseline and stable Postop Assessment: no apparent nausea or vomiting Anesthetic complications: no   No notable events documented.  Last Vitals:  Vitals:   10/29/23 1600 10/29/23 1629  BP: (!) 143/89 (!) 141/101  Pulse: 66   Resp: (!) 0 (!) 0  Temp:  36.4 C  SpO2: 98% 98%    Last Pain:  Vitals:   10/29/23 1629  TempSrc:   PainSc: 8                  Leslye Rast

## 2023-10-31 LAB — SURGICAL PATHOLOGY

## 2023-11-01 ENCOUNTER — Other Ambulatory Visit (HOSPITAL_COMMUNITY): Payer: Self-pay | Admitting: Obstetrics and Gynecology

## 2023-11-01 DIAGNOSIS — N950 Postmenopausal bleeding: Secondary | ICD-10-CM

## 2023-11-01 MED ORDER — MEDROXYPROGESTERONE ACETATE 10 MG PO TABS: 10.0000 mg | ORAL_TABLET | Freq: Every day | ORAL | 0 refills | Status: DC

## 2023-11-01 NOTE — Progress Notes (Signed)
 HIV positive/asymptomatic HIV infection status HIV undetectable. Normal CD4 count.   Lacey Pian, MD Attending Obstetrician & Gynecologist, [facility] for [facility], [facility]

## 2023-11-01 NOTE — Progress Notes (Signed)
 Unable to clinically determine - No findings on colposcopy which is why I took random biopsies.   Lacey Pian, MD Attending Obstetrician & Gynecologist, [facility] for [facility], [facility]

## 2023-11-04 ENCOUNTER — Telehealth: Payer: Self-pay

## 2023-11-04 ENCOUNTER — Encounter: Payer: Self-pay | Admitting: Obstetrics and Gynecology

## 2023-11-04 NOTE — Telephone Encounter (Signed)
 Encounter addressed in telephone encounter 11/04/23

## 2023-11-04 NOTE — Telephone Encounter (Signed)
 Called patient regarding her Mychart message. Patient reports that she has had abdominal pain prior to surgery but seems to worsen after her surgery 10/29/23. Patient was prescribed ibuprofen  800 mg PRN. Patient reports taking it but does not help much. Pain is located middle ABD, 10/10 sharp/cramping pain. Advised patient that she can also take Tylenol  and to take Ibuprofen  with food. Also advised patient to do heat compress such as a heating pack to see if that provides any relief. Patient states she will try these regimens to see if that works. Patient also reports that she has not started taking Provera  yet but will pick up medication. Advised patient to contact our office for any other questions or concerns.

## 2023-11-12 ENCOUNTER — Ambulatory Visit

## 2023-11-12 ENCOUNTER — Encounter: Payer: Self-pay | Admitting: Family

## 2023-11-12 ENCOUNTER — Ambulatory Visit: Admitting: Family

## 2023-11-12 ENCOUNTER — Other Ambulatory Visit: Payer: Self-pay

## 2023-11-12 ENCOUNTER — Other Ambulatory Visit (HOSPITAL_COMMUNITY): Payer: Self-pay

## 2023-11-12 VITALS — BP 122/85 | HR 73 | Temp 97.7°F | Ht 65.0 in | Wt 314.0 lb

## 2023-11-12 DIAGNOSIS — Z6841 Body Mass Index (BMI) 40.0 and over, adult: Secondary | ICD-10-CM | POA: Diagnosis not present

## 2023-11-12 DIAGNOSIS — B20 Human immunodeficiency virus [HIV] disease: Secondary | ICD-10-CM

## 2023-11-12 DIAGNOSIS — Z79899 Other long term (current) drug therapy: Secondary | ICD-10-CM

## 2023-11-12 DIAGNOSIS — R4584 Anhedonia: Secondary | ICD-10-CM | POA: Insufficient documentation

## 2023-11-12 DIAGNOSIS — R1010 Upper abdominal pain, unspecified: Secondary | ICD-10-CM | POA: Diagnosis not present

## 2023-11-12 MED ORDER — OMEPRAZOLE 40 MG PO CPDR
40.0000 mg | DELAYED_RELEASE_CAPSULE | Freq: Every day | ORAL | 2 refills | Status: AC
  Filled 2023-11-12: qty 30, 30d supply, fill #0

## 2023-11-12 MED ORDER — SYMTUZA 800-150-200-10 MG PO TABS
1.0000 | ORAL_TABLET | Freq: Every day | ORAL | 5 refills | Status: AC
  Filled 2023-11-12 – 2023-12-09 (×3): qty 30, 30d supply, fill #0
  Filled 2024-01-01: qty 30, 30d supply, fill #1
  Filled 2024-02-13 – 2024-04-16 (×4): qty 30, 30d supply, fill #2
  Filled 2024-05-14 – 2024-07-29 (×3): qty 30, 30d supply, fill #3

## 2023-11-12 NOTE — Assessment & Plan Note (Signed)
 Ms. Boss has a BMI of 52.25 indicating severe obesity and is considering gastric bypass surgery.  Discussed need for stabilization of mood as well as evaluation of abdominal pain prior to further investigation of surgeries.  Encouraged to continue with lifestyle management in the interim.

## 2023-11-12 NOTE — Assessment & Plan Note (Signed)
 Brenda Tyler continues to have well-controlled virus with good adherence and tolerance to Symtuza .  Reviewed previous lab work and discussed plan of care and U equals U.  Social determinants of health reviewed with no interventions indicated.  Check blood work.  Continue current dose of Symtuza .  Plan for follow-up in 6 months or sooner if needed with lab work on the same day.

## 2023-11-12 NOTE — Assessment & Plan Note (Signed)
 Brenda Tyler has upper abdominal pain of unclear origin and possibly related to uncontrolled gastroesophageal reflux versus alternative abdominal pathology.  Will obtain ultrasound and start omeprazole 40 mg.  Encouraged to follow a GERD type diet to reduce risk of reflux.  Additional evaluation pending ultrasound results and started medication.

## 2023-11-12 NOTE — Assessment & Plan Note (Signed)
 Brenda Tyler has anhedonia and question underlying depression.  No suicidal ideations or signs of psychosis or hallucinations.  She is questioning if she needs FMLA for work purposes.  Will check basic lab work including thyroid  and A1c for any potential metabolic causes.  Counseling resources provided in after visit summary.  Will defer any medication to psychiatry.  Additional treatment pending lab work results as needed.

## 2023-11-12 NOTE — Patient Instructions (Signed)
 Nice to see you.  We will check your lab work today.  Continue to take your medication daily as prescribed.  Refills have been sent to the pharmacy.  Start taking the omeprazole daily to see if that helps with reflux and abdominal pain.   They will schedule your ultrasound.   Plan for follow up in 1 months or sooner if needed with lab work on the same day.  Have a great day and stay safe!  Mental Health Resources  988: can call or text 24/7  [facility]: Address: [address] [place], [place] [address] Open 24 hours Phone: [phone]  [facility]: Address: [address] [place], [place] [address] Phone: [phone] Appointments: [email or web address]

## 2023-11-12 NOTE — Progress Notes (Signed)
 Brief Narrative   Patient ID: Brenda Tyler, female    DOB: 05/07/1986, 38 y.o.   MRN: [id number]  Brenda Tyler is a 38 y/o AA female diagnosed with HIV disease in October 2015 with risk factor of heterosexual contact. Initial viral load was 34,970 with CD4 count 190. Entered care at [facility] Stage 3. Genotype with K103N (R - efavirenz, nevirapine). No previous history of opportunistic infection. [id number] and Quantiferon Gold negative. ART experienced with Stribild , Genovya and currently Symtuza .   Subjective:   Chief Complaint  Patient presents with   Follow-up    B20, Abdominal pain    HPI:  Brenda Tyler is a 38 y.o. female with HIV disease last seen on 05/03/2023 with well-controlled virus and good adherence and tolerance to Symtuza .  Viral load was undetectable with CD4 count 886.Aaron Aas  Kidney function, liver function, electrolytes within normal ranges.  RPR was nonreactive for syphilis.  In the interim was recently admitted to the hospital for D&C/hysteroscopy and colposcopy.  Here today for routine follow-up.  Brenda Tyler has not been doing well since her last office visit and has a few ongoing concerns.  She does continue to take Symtuza  daily as prescribed with no adverse side effects or problems obtaining medication from the pharmacy and is covered by Medicaid.  She continues to work full-time although has had decreased energy and no desire or motivation to do anything with severity enough to affect her job and possibly considering FMLA paperwork.  She has an appointment with psychiatry next week.  Denies any suicidal ideations or hallucinations.  This has been ongoing for up to the last year and progressively worsening in the last couple of months.  She has also been experiencing generalized abdominal pain with no changes in bowel pattern and denies constipation or diarrhea.  No nausea or vomiting.  She does have a history of gastroesophageal reflux making it difficult for her to lie down at night  at times.  Stomach pain waxes and wanes and is worsened with increased activity and she questions if this is related to anxiety.  She is considering gastric bypass surgery to help with her weight loss with current BMI of 52.25.  Housing, access to food and transportation are stable. Healthcare maintenance reviewed. Condoms and site specific STD testing offered.  Denies fevers, chills, night sweats, headaches, changes in vision, neck pain/stiffness, nausea, diarrhea, vomiting, lesions or rashes.  Lab Results  Component Value Date   CD4TCELL 38 05/03/2023   CD4TABS 485 01/25/2022   Lab Results  Component Value Date   HIV1RNAQUANT Not Detected 05/03/2023     Allergies  Allergen Reactions   Dilaudid  [Hydromorphone  Hcl] Other (See Comments)    Makes her dizzy      Outpatient Medications Prior to Visit  Medication Sig Dispense Refill   ibuprofen  (ADVIL ) 800 MG tablet Take 1 tablet (800 mg total) by mouth 3 (three) times daily with meals as needed for headache, moderate pain (pain score 4-6) or cramping. 60 tablet 0   medroxyPROGESTERone  (PROVERA ) 10 MG tablet Take 1 tablet (10 mg total) by mouth daily. 90 tablet 0   Darunavir -Cobicistat-Emtricitabine-Tenofovir Alafenamide (SYMTUZA ) 800-150-200-10 MG TABS Take 1 tablet by mouth daily with breakfast. 30 tablet 3   No facility-administered medications prior to visit.     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  HSIL s/p Colposcopy at 12/14 - CINI LSIL on pap 4/15 - s/p Colpo 11/02/13 - CIN II -->    HIV (human immunodeficiency virus infection) (HCC)    HSV (herpes simplex virus) infection    Obesity    Oropharyngeal candidiasis 07/05/2014   Other fatigue    Shortness of breath on exertion      Past Surgical History:  Procedure Laterality Date   ANKLE SURGERY Right    COLPOSCOPY N/A 10/29/2023    Procedure: COLPOSCOPY;  Surgeon: Lacey Pian, MD;  Location: [facility] OR;  Service: Gynecology;  Laterality: N/A;   HYSTEROSCOPY WITH D & C N/A 10/29/2023   Procedure: DILATATION AND CURETTAGE /HYSTEROSCOPY;  Surgeon: Lacey Pian, MD;  Location: [facility] OR;  Service: Gynecology;  Laterality: N/A;   INCISION AND DRAINAGE ABSCESS     on abdomen and buttocks        Review of Systems  Constitutional:  Positive for fatigue. Negative for appetite change, chills, diaphoresis, fever and unexpected weight change.  Eyes:        Negative for acute change in vision  Respiratory:  Negative for chest tightness, shortness of breath and wheezing.   Cardiovascular:  Negative for chest pain.  Gastrointestinal:  Positive for abdominal pain. Negative for constipation, diarrhea, nausea and vomiting.  Genitourinary:  Negative for dysuria, pelvic pain and vaginal discharge.  Musculoskeletal:  Negative for neck pain and neck stiffness.  Skin:  Negative for rash.  Neurological:  Negative for seizures, syncope, weakness and headaches.  Hematological:  Negative for adenopathy. Does not bruise/bleed easily.  Psychiatric/Behavioral:  Positive for dysphoric mood. Negative for hallucinations.      Objective:   BP 122/85   Pulse 73   Temp 97.7 F (36.5 C) (Temporal)   Ht 5\' 5"  (1.651 m)   Wt (!) 314 lb (142.4 kg)   LMP  (LMP Unknown)   SpO2 99%   BMI 52.25 kg/m  Nursing note and vital signs reviewed.  Physical Exam Constitutional:      General: She is not in acute distress.    Appearance: She is well-developed.  Eyes:     Conjunctiva/sclera: Conjunctivae normal.  Cardiovascular:     Rate and Rhythm: Normal rate and regular rhythm.     Heart sounds: Normal heart sounds. No murmur heard.    No friction rub. No gallop.  Pulmonary:     Effort: Pulmonary effort is normal. No respiratory distress.     Breath sounds: Normal breath sounds. No wheezing or rales.  Chest:     Chest wall: No tenderness.   Abdominal:     General: Bowel sounds are normal.     Palpations: Abdomen is soft.     Tenderness: There is no abdominal tenderness.  Musculoskeletal:     Cervical back: Neck supple.  Lymphadenopathy:     Cervical: No cervical adenopathy.  Skin:    General: Skin is warm and dry.     Findings: No rash.  Neurological:     Mental Status: She is alert and oriented to person, place, and time.  Psychiatric:        Behavior: Behavior normal.        Thought Content: Thought content normal.        Judgment: Judgment normal.          09/27/2023   10:25 AM 08/27/2023    9:38 AM 05/03/2023   10:46 AM 01/31/2023    9:31 AM 06/15/2021    2:16 PM  Depression  screen PHQ 2/9  Decreased Interest 0 0 0 0 0  Down, Depressed, Hopeless 0 0 0 0 0  PHQ - 2 Score 0 0 0 0 0  Altered sleeping 0 0     Tired, decreased energy 0 2     Change in appetite 0 0     Feeling bad or failure about yourself  0 0     Trouble concentrating 0 0     Moving slowly or fidgety/restless 0 0     Suicidal thoughts 0 0     PHQ-9 Score 0 2           09/27/2023   10:25 AM 08/27/2023    9:38 AM 11/14/2020    2:22 PM 07/31/2019   11:42 AM  GAD 7 : Generalized Anxiety Score  Nervous, Anxious, on Edge 0 0 0 3  Control/stop worrying 0 0 0 3  Worry too much - different things 0 0 0 3  Trouble relaxing 0 0 0 3  Restless 0 0 0 3  Easily annoyed or irritable 0 0 0 2  Afraid - awful might happen 0 0 0 3  Total GAD 7 Score 0 0 0 20          Assessment & Plan:    Patient Active Problem List   Diagnosis Date Noted   Upper abdominal pain 11/12/2023   Anhedonia 11/12/2023   Postmenopausal bleeding 10/29/2023   Abnormal Pap smear of cervix 10/29/2023   High risk human papilloma cervical smear 10/29/2023   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   Breast pain 01/31/2023   Healthcare maintenance 01/31/2023   Pre-diabetes 11/02/2020   Vitamin D  deficiency 11/02/2020   At risk for diabetes mellitus 11/02/2020   Acute  intractable headache 02/23/2020   Dizziness and giddiness 02/23/2020   Cyst of bone of left hand 12/10/2019   Dislocation of knee, anterior, left, closed 01/21/2016   Closed anterior dislocation of left knee 01/21/2016   Depression 07/26/2014   Herpes labialis 07/03/2014   HIV disease (HCC) 04/23/2014   Severe obesity (HCC) 12/22/2012     Problem List Items Addressed This Visit       Other   HIV disease (HCC) (Chronic)   Earnesteen continues to have well-controlled virus with good adherence and tolerance to Symtuza .  Reviewed previous lab work and discussed plan of care and U equals U.  Social determinants of health reviewed with no interventions indicated.  Check blood work.  Continue current dose of Symtuza .  Plan for follow-up in 6 months or sooner if needed with lab work on the same day.      Relevant Medications   Darunavir -Cobicistat-Emtricitabine-Tenofovir Alafenamide (SYMTUZA ) 800-150-200-10 MG TABS   Other Relevant Orders   Comprehensive metabolic panel with GFR   HIV-1 RNA quant-no reflex-bld   T-helper cell (CD4)- ([facility] clinic only)   Severe obesity (HCC)   Ms. Sanjurjo has a BMI of 52.25 indicating severe obesity and is considering gastric bypass surgery.  Discussed need for stabilization of mood as well as evaluation of abdominal pain prior to further investigation of surgeries.  Encouraged to continue with lifestyle management in the interim.      Upper abdominal pain   Ms. Savino has upper abdominal pain of unclear origin and possibly related to uncontrolled gastroesophageal reflux versus alternative abdominal pathology.  Will obtain ultrasound and start omeprazole 40 mg.  Encouraged to follow a GERD type diet to reduce risk of reflux.  Additional evaluation pending  ultrasound results and started medication.      Relevant Orders   US  Abdomen Complete   Anhedonia - Primary   Ms. Witherspoon has anhedonia and question underlying depression.  No suicidal ideations or signs of  psychosis or hallucinations.  She is questioning if she needs FMLA for work purposes.  Will check basic lab work including thyroid  and A1c for any potential metabolic causes.  Counseling resources provided in after visit summary.  Will defer any medication to psychiatry.  Additional treatment pending lab work results as needed.      Relevant Orders   Thyroid  Panel With TSH   HgB A1c   Other Visit Diagnoses       Pharmacologic therapy       Relevant Orders   Lipid panel        I am having Brenda Tyler start on omeprazole. I am also having her maintain her ibuprofen , medroxyPROGESTERone , and Symtuza .   Meds ordered this encounter  Medications   Darunavir -Cobicistat-Emtricitabine-Tenofovir Alafenamide (SYMTUZA ) 800-150-200-10 MG TABS    Sig: Take 1 tablet by mouth daily with breakfast.    Dispense:  30 tablet    Refill:  5    Pick up    Supervising Provider:   Liane Redman [phone]    Prescription Type::   Renewal   omeprazole (PRILOSEC) 40 MG capsule    Sig: Take 1 capsule (40 mg total) by mouth daily.    Dispense:  30 capsule    Refill:  2    Pick up    Supervising Provider:   Liane Redman [phone]     Follow-up: Return in about 1 month (around 12/13/2023). or sooner if needed.    Marlan Silva, MSN, FNP-C Nurse Practitioner [facility] Main number: [phone]

## 2023-11-13 DIAGNOSIS — F321 Major depressive disorder, single episode, moderate: Secondary | ICD-10-CM | POA: Diagnosis not present

## 2023-11-13 DIAGNOSIS — Z21 Asymptomatic human immunodeficiency virus [HIV] infection status: Secondary | ICD-10-CM | POA: Diagnosis not present

## 2023-11-13 DIAGNOSIS — F411 Generalized anxiety disorder: Secondary | ICD-10-CM | POA: Diagnosis not present

## 2023-11-13 DIAGNOSIS — E782 Mixed hyperlipidemia: Secondary | ICD-10-CM | POA: Diagnosis not present

## 2023-11-13 LAB — T-HELPER CELL (CD4) - (RCID CLINIC ONLY)
CD4 % Helper T Cell: 36 % (ref 33–65)
CD4 T Cell Abs: 447 /uL (ref 400–1790)

## 2023-11-14 ENCOUNTER — Ambulatory Visit
Admission: RE | Admit: 2023-11-14 | Discharge: 2023-11-14 | Disposition: A | Discharge status: Home / Self Care | Source: Ambulatory Visit | Attending: Family | Admitting: Family

## 2023-11-14 ENCOUNTER — Other Ambulatory Visit: Payer: Self-pay

## 2023-11-14 ENCOUNTER — Ambulatory Visit (INDEPENDENT_AMBULATORY_CARE_PROVIDER_SITE_OTHER): Admitting: Genetic Counselor

## 2023-11-14 VITALS — BP 123/61 | HR 84 | Wt 313.9 lb

## 2023-11-14 DIAGNOSIS — R1010 Upper abdominal pain, unspecified: Secondary | ICD-10-CM | POA: Diagnosis not present

## 2023-11-14 DIAGNOSIS — Z1379 Encounter for other screening for genetic and chromosomal anomalies: Secondary | ICD-10-CM | POA: Diagnosis not present

## 2023-11-14 DIAGNOSIS — K8020 Calculus of gallbladder without cholecystitis without obstruction: Secondary | ICD-10-CM | POA: Diagnosis not present

## 2023-11-14 DIAGNOSIS — K760 Fatty (change of) liver, not elsewhere classified: Secondary | ICD-10-CM | POA: Diagnosis not present

## 2023-11-14 DIAGNOSIS — Z148 Genetic carrier of other disease: Secondary | ICD-10-CM

## 2023-11-14 DIAGNOSIS — E2839 Other primary ovarian failure: Secondary | ICD-10-CM | POA: Diagnosis not present

## 2023-11-14 LAB — COMPREHENSIVE METABOLIC PANEL WITH GFR
AG Ratio: 1.2 (calc) (ref 1.0–2.5)
ALT: 16 U/L (ref 6–29)
AST: 13 U/L (ref 10–30)
Albumin: 4 g/dL (ref 3.6–5.1)
Alkaline phosphatase (APISO): 115 U/L (ref 31–125)
BUN: 8 mg/dL (ref 7–25)
CO2: 27 mmol/L (ref 20–32)
Calcium: 9.4 mg/dL (ref 8.6–10.2)
Chloride: 104 mmol/L (ref 98–110)
Creat: 0.78 mg/dL (ref 0.50–0.97)
Globulin: 3.4 g/dL (ref 1.9–3.7)
Glucose, Bld: 87 mg/dL (ref 65–99)
Potassium: 3.8 mmol/L (ref 3.5–5.3)
Sodium: 139 mmol/L (ref 135–146)
Total Bilirubin: 0.5 mg/dL (ref 0.2–1.2)
Total Protein: 7.4 g/dL (ref 6.1–8.1)
eGFR: 99 mL/min/{1.73_m2} (ref >=60)

## 2023-11-14 LAB — LIPID PANEL
Cholesterol: 246 mg/dL — ABNORMAL HIGH (ref <200)
HDL: 68 mg/dL (ref >=50)
LDL Cholesterol (Calc): 159 mg/dL — ABNORMAL HIGH
Non-HDL Cholesterol (Calc): 178 mg/dL — ABNORMAL HIGH (ref <130)
Total CHOL/HDL Ratio: 3.6 (calc) (ref <5.0)
Triglycerides: 87 mg/dL (ref <150)

## 2023-11-14 LAB — THYROID PANEL WITH TSH
Free Thyroxine Index: 3.1 (ref 1.4–3.8)
T3 Uptake: 28 % (ref 22–35)
T4, Total: 10.9 ug/dL (ref 5.1–11.9)
TSH: 1.23 m[IU]/L

## 2023-11-14 LAB — HEMOGLOBIN A1C
Hgb A1c MFr Bld: 5.6 % (ref <5.7)
Mean Plasma Glucose: 114 mg/dL
eAG (mmol/L): 6.3 mmol/L

## 2023-11-14 LAB — HIV-1 RNA QUANT-NO REFLEX-BLD
HIV 1 RNA Quant: NOT DETECTED {copies}/mL
HIV-1 RNA Quant, Log: NOT DETECTED {Log_copies}/mL

## 2023-11-14 NOTE — Progress Notes (Signed)
 Specialty Pharmacy Refill Coordination Note  Brenda Tyler is a 38 y.o. female contacted today regarding refills of specialty medication(s) Darun-Cobic-Emtricit-TenofAF (Symtuza )   Patient requested Kseniya Grunden Dk at [facility] [place] date: 11/20/23   Medication will be filled on 11/19/23.

## 2023-11-14 NOTE — Progress Notes (Unsigned)
 GENETIC COUNSELING NEW PATIENT EVALUATION Patient name: Brenda Tyler DOB: Sep 26, 1985 Age: 38 y.o. 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 She is unaccompanied at today's visit.  Prior genetic testing has been performed. 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  She was found to have increased carrier risk for Spinal Muscular Atrophy due to the presence of the [id number].27134T>G variant.   Family History: See pedigree obtained during today's visit under History->Family->Pedigree.  The family history was notable for the following: Son, 73 yo, with learning disabilities and sickle cell trait. Son, 62 yo, alive and well. Son, 34 yo, with learning disabilities.  Paternal Family History Father, 2 yo, with hypertension. Uncle, alive and well. Grandfather, deceased from prostate cancer over the age of 64 yo. Grandmother deceased, possibly from heart failure.  Maternal Family History Mother, in her late 73s, no information available about her or her family history. Half-brother, 62 yo, alive and well. Half-sister, 10 yo, alive and well. Her son, 63 yo, with autism spectrum disorder.  Mother's ethnicity: African American Father's ethnicity: African American, Native American Consangunity: Denies   Prior Genetic testing: Prior genetic testing has been performed. 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  She was found to have increased carrier risk for Spinal Muscular Atrophy due to the presence of the g.27134T>G  variant.  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  Brenda Tyler first presented with concern regarding infertility in 2015.  At this time, she desired to have one more child but had been unable to get pregnant after 5 years.  She initially attributed this to the use Depo Provera  and having irregular menstrual cycles following its discontinuation.  However, during work-up she was found to have elevated FSH and LH with normal testosterone .  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  A karyotype and Fragile X syndrome analysis were subsequently ordered.  The karyotype showed normal female chromosomes of 60, XX.  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 (Report located in clinic note dated 10/29/2013)  Brenda Tyler does not recall ever being made aware of these results.  In March 2025, this test was repeated at [facility] laboratories with similar results (72/33 CGG repeats).  Fragile X syndrome is an X-linked condition caused by a mutation in the FMR1 gene known due to increased CGG repeats. Humans typically have between 5 and 44 CGG repeats in the FMR1 gene. If a person has between 45 and 54 CGG repeats, they are an intermediate carrier. If a person has between 55 and 200 repeats, they are a premutation carrier. Individuals with >200 CGG repeats (full mutation) are considered to be affected with fragile X syndrome. Alleles with the intermediate repeat size or greater are at risk of expansion to larger mutations when passed down to offspring.  It was discussed with Brenda Tyler  that there is a 50% risk she passed on her X chromosome with the 72 CGG repeat length in each pregnancy and if passed down, there is a 31% chance that this will expand to a full  mutation.   Most individuals with >200 CGG repeats have abnormal gene methylation that silences the FMR1 gene, preventing production of the FMRP protein. This protein has a role in the development of synapses that are critical for relaying nerve impulses. Loss or deficiency of the FMRP protein leads to the symptoms associated with Fragile X Syndrome, including mild to moderate intellectual disability, developmental delays, autism, behavioral abnormalities, and characteristic physical features. We discussed that males with >200 CGG repeats are more severely affected than females since males have one X chromosome and females have two X chromosomes. Furthermore, 100% of males and 50% of females with a completely methylated full mutation would be expected to have intellectual disability. Males with an unmethylated full mutation may not have intellectual disability, or if intellectual disability is present it may be high functioning. They may also have anxiety or behavioral issues. Females with an unmethylated full mutation may have normal intellect or intellectual disability, the range varies.    I encouraged Brenda Tyler to contact her son's primary care providers for referral for genetic testing, especially her two sons with learning disabilities and behavioral concerns.  Given the risk of Fragile X syndrome in these individuals, genetic evaluation is recommended.  Given Brenda Tyler carrier screening results, we do not consider her to be affected with fragile X syndrome, however, premutation carriers may sometimes have behavior problems, social anxiety, and/or difficulty with social skills.   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  Women with FXPOI may have irregular menstrual cycles, early-onset elevated levels of follicle stimulating hormone [facility]), early menopause,  and may have infertility or a more difficult time having children. Women who are premutation carriers also have an 8-17% chance of developing a disorder known as Fragile X-Associated Tremor/Ataxia Syndrome (FXTAS). Women with FXTAS have problems starting after age 32 that include problems with movement (ataxia), tremor, memory loss, loss of sensation in the lower extremities, and mental and behavioral changes.  We discussed that Fragile X syndrome premutation carriers are recommended to consider hormone replacement therapy to preserve bone and cardiac health.  Brenda Tyler plans to discuss this with her primary care provider.  Recommendations: Contact son's primary care provider for referral for genetic testing. Consider hormone replacement therapy. Continue follow-up with other healthcare providers as recommended.  Date: 11/14/2023 Total time spent: 60 minutes Genetic Counselor-only time: 60 minutes  Time spent includes face to face and non-face to face care for the patient on the date of this encounter (history, genetic counseling, coordination of care, data gathering and/or documentation as outlined).   Philbert Brave MS [facility] Certified Genetic Counselor [facility]

## 2023-11-15 ENCOUNTER — Ambulatory Visit: Payer: Self-pay | Admitting: Family

## 2023-11-15 DIAGNOSIS — K8020 Calculus of gallbladder without cholecystitis without obstruction: Secondary | ICD-10-CM

## 2023-11-19 ENCOUNTER — Other Ambulatory Visit: Payer: Self-pay

## 2023-11-20 ENCOUNTER — Other Ambulatory Visit: Payer: Self-pay

## 2023-11-20 ENCOUNTER — Ambulatory Visit (HOSPITAL_BASED_OUTPATIENT_CLINIC_OR_DEPARTMENT_OTHER): Payer: Self-pay | Admitting: Family

## 2023-11-20 ENCOUNTER — Encounter (HOSPITAL_COMMUNITY): Payer: Self-pay | Admitting: Family

## 2023-11-20 VITALS — BP 129/91 | HR 73 | Ht 64.5 in | Wt 313.0 lb

## 2023-11-20 DIAGNOSIS — F321 Major depressive disorder, single episode, moderate: Secondary | ICD-10-CM

## 2023-11-20 MED ORDER — BUPROPION HCL ER (SR) 100 MG PO TB12: 100.0000 mg | ORAL_TABLET | Freq: Every day | ORAL | 2 refills | Status: DC

## 2023-11-20 NOTE — Progress Notes (Signed)
 Psychiatric Initial Adult Assessment   Patient Identification: Brenda Tyler MRN:  [id number] Date of Evaluation:  11/20/2023 Referral Source: OB/GYN Chief Complaint: Major depression disorder  Visit Diagnosis:    ICD-10-CM   1. Current moderate episode of major depressive disorder, unspecified whether recurrent (HCC)  F32.1       History of Present Illness:  Brenda Tyler is a 38 year old African-American female who presents to establish care.  Seen and evaluated face-to-face by this provider.patient was seen and evaluated face-to-face by this provider.  She presents flat, guarded and very minimal throughout this assessment.  States she was referred by her OB/GYN. "  I guess it was due to how I was answering questions."  She denies suicidal ideations.  Reports worsening depression for over 1 year.  Denies that she is ever been followed by therapy or psychiatry in the past.  Brenda Tyler reports her symptoms include irritability, decreased energy, sleep disturbance, depression, anxiety and work related issues.  She denied medical history related to seizure disorder headaches or migraines.  Denied that she is followed by neurology currently.  Brenda Tyler denied illicit drug use or substance abuse history denied previous inpatient admissions fro mental health.  Denied history of self injures behaviors.  Patient carries a diagnosis with HIV, states she has been diagnosed for few years.  States it was passed through her children's father.  States she has 3 sons.  Reports a pretty good relationship with all of them.  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  Reports she has missed most days due to mood instability.PHQ=9 18 GAD7= 9  Major depressive disorder: Generalized anxiety disorder: Unspecified mood disorder:  Discussed initiating Wellbutrin 100 mg daily for mood stabilization - Follow-up with intensive outpatient programming for FMLA completion with paperwork. -  Chart reviewed thyroid  disorder TSH within normal limits, A1c 5.6 May need vitamin D  level to be collected   During evaluation Brenda Tyler very limited and guarded throughout this assessment.  She presents flat and slightly desponded. she is alert/oriented x 3; calm/cooperative; and mood congruent with affect.  Patient is speaking in a clear tone at moderate volume, and normal pace; with good eye contact.   Her thought process is coherent and relevant; There is no indication that he is currently responding to internal/external stimuli or experiencing delusional thought content.  Patient denies suicidal/self-harm/homicidal ideation, psychosis, and paranoia.  Patient has remained calm throughout assessment and has answered questions appropriately.   Associated Signs/Symptoms: Depression Symptoms:  depressed mood, difficulty concentrating, hopelessness, anxiety, (Hypo) Manic Symptoms:  Distractibility, Irritable Mood, Anxiety Symptoms:  Excessive Worry, Psychotic Symptoms:  Hallucinations: None PTSD Symptoms: Had a traumatic exposure:  with HIV dx  Past Psychiatric History:  denied   Previous Psychotropic Medications: No   Substance Abuse History in the last 12 months:  No.  Consequences of Substance Abuse: NA  Past Medical History:  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 - s/p Colpo 11/02/13 - CIN II -->    HIV (human immunodeficiency virus infection) (HCC)    HSV (herpes simplex virus) infection    Obesity    Oropharyngeal candidiasis 07/05/2014   Other fatigue    Shortness of breath on exertion     Past Surgical History:  Procedure Laterality Date   ANKLE SURGERY Right    COLPOSCOPY N/A 10/29/2023   Procedure: COLPOSCOPY;  Surgeon: Lacey Pian, MD;  Location: [facility] OR;  Service: Gynecology;   Laterality: N/A;   HYSTEROSCOPY WITH D & C N/A 10/29/2023   Procedure: DILATATION AND CURETTAGE /HYSTEROSCOPY;  Surgeon: Lacey Pian, MD;  Location: [facility] OR;  Service: Gynecology;  Laterality: N/A;   INCISION AND DRAINAGE ABSCESS     on abdomen and buttocks    Family Psychiatric History:   Family History:  Family History  Problem Relation Age of Onset   Heart disease Mother    Sudden death Mother    Stroke Mother    Hypertension Maternal Grandmother    Diabetes Maternal Grandmother    Heart disease Maternal Grandmother    Cancer Maternal Grandmother    Hypertension Paternal Grandmother    Diabetes Paternal Grandmother    Heart disease Paternal Grandmother     Social History:   Social History   Socioeconomic History   Marital status: Single    Spouse name: Not on file   Number of children: 3   Years of education: Not on file   Highest education level: Not on file  Occupational History   Occupation: housekeeper    Employer: [employer]  Tobacco Use   Smoking status: Never   Smokeless tobacco: Never  Vaping Use   Vaping status: Never Used  Substance and Sexual Activity   Alcohol use: Not Currently    Alcohol/week: 2.0 standard drinks of alcohol    Types: 2 Standard drinks or equivalent per week    Comment: occasional   Drug use: Not Currently   Sexual activity: Yes    Partners: Male    Birth control/protection: None    Comment: declined condoms  Other Topics Concern   Not on file  Social History Narrative   Not on file   Social Drivers of Health   Financial Resource Strain: Not on file  Food Insecurity: No Food Insecurity (09/27/2023)   Hunger Vital Sign    Worried About Running Out of Food in the Last Year: Never true    Ran Out of Food in the Last Year: Never true  Transportation Needs: No Transportation Needs (09/27/2023)   PRAPARE - Administrator, Civil Service (Medical): No    Lack of Transportation (Non-Medical): No  Physical Activity:  Not on file  Stress: Not on file  Social Connections: Not on file    Additional Social History:   Allergies:   Allergies  Allergen Reactions   Dilaudid  [Hydromorphone  Hcl] Other (See Comments)    Makes her dizzy    Metabolic Disorder Labs: Lab Results  Component Value Date   HGBA1C 5.6 11/12/2023   MPG 114 11/12/2023   MPG 105 06/15/2021   Lab Results  Component Value Date   PROLACTIN 7.3 10/17/2020   PROLACTIN 5.6 12/22/2012   Lab Results  Component Value Date   CHOL 246 (H) 11/12/2023   TRIG 87 11/12/2023   HDL 68 11/12/2023   CHOLHDL 3.6 11/12/2023   VLDL 16 01/15/2017   LDLCALC 159 (H) 11/12/2023   LDLCALC 139 (H) 01/25/2022   Lab Results  Component Value Date   TSH 1.23 11/12/2023    Therapeutic Level Labs: No results found for: "LITHIUM" No results found for: "CBMZ" No results found for: "VALPROATE"  Current Medications: Current Outpatient Medications  Medication Sig Dispense Refill   buPROPion  ER (WELLBUTRIN  SR) 100 MG 12 hr  tablet Take 1 tablet (100 mg total) by mouth daily. 60 tablet 2   Darunavir -Cobicistat-Emtricitabine-Tenofovir Alafenamide (SYMTUZA ) 800-150-200-10 MG TABS Take 1 tablet by mouth daily with breakfast. 30 tablet 5   hydrOXYzine (ATARAX) 25 MG tablet Take 25 mg by mouth at bedtime.     ibuprofen  (ADVIL ) 800 MG tablet Take 1 tablet (800 mg total) by mouth 3 (three) times daily with meals as needed for headache, moderate pain (pain score 4-6) or cramping. (Patient not taking: Reported on 11/20/2023) 60 tablet 0   medroxyPROGESTERone  (PROVERA ) 10 MG tablet Take 1 tablet (10 mg total) by mouth daily. (Patient not taking: Reported on 11/20/2023) 90 tablet 0   omeprazole (PRILOSEC) 40 MG capsule Take 1 capsule (40 mg total) by mouth daily. (Patient not taking: Reported on 11/20/2023) 30 capsule 2   No current facility-administered medications for this visit.    Musculoskeletal: Strength & Muscle Tone: within normal limits Gait & Station:  normal Patient leans: N/A  Psychiatric Specialty Exam: Review of Systems  Blood pressure (!) 129/91, pulse 73, height 5' 4.5" (1.638 m), weight (!) 313 lb (142 kg).Body mass index is 52.9 kg/m.  General Appearance: Casual  Eye Contact:  Good  Speech:  Clear and Coherent  Volume:  Normal  Mood:  Anxious and Depressed  Affect:  Congruent  Thought Process:  Coherent  Orientation:  Full (Time, Place, and Person)  Thought Content:  Logical  Suicidal Thoughts:  No  Homicidal Thoughts:  No  Memory:  Immediate;   Good Remote;   Good  Judgement:  Good  Insight:  Good  Psychomotor Activity:  Normal  Concentration:  Concentration: Good  Recall:  Good  Fund of Knowledge:Good  Language: Good  Akathisia:  No  Handed:  Right  AIMS (if indicated):  not done  Assets:  Communication Skills Desire for Improvement Resilience Social Support  ADL's:  Intact  Cognition: WNL  Sleep:  Fair   Screenings: GAD-7    Garment/textile technologist Visit from 09/27/2023 in [facility] Office Visit from 08/27/2023 in [facility] Office Visit from 11/14/2020 in [facility] Procedure visit from 07/30/2019 in [facility] Office Visit from 05/19/2018 in Center for [facility]  Total GAD-7 Score 0 0 0 20 4      PHQ2-9    Flowsheet Row [facility] Office Visit from 09/27/2023 in [facility] Office Visit from 08/27/2023 in [facility] Office Visit from 05/03/2023 in [facility] [place] Office Visit from 01/31/2023 in [facility] [place]  PHQ-2  Total Score 5 0 0 0 0  PHQ-9 Total Score 18 0 2 -- --      Flowsheet Row Office Visit from 11/20/2023 in [facility] Admission (Discharged) from 10/29/2023 in [facility] from 10/15/2023 in [facility])  C-SSRS RISK CATEGORY Error: Q3, 4, or 5 should not be populated when Q2 is No No Risk No Risk       Assessment and Plan: Brenda Tyler 38 year old African-American female presents to establish  care.  Patient presents flat, guarded and very limited throughout this assessment.  States that she struggling with depression and anxiety which is inhibiting her from doing her job effectively.  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  States she has missed multiple days from work due to her depression symptoms.  She denies that she is previously been on any psychotropic medications.  Denied concerns related to suicidal or homicidal ideations.  Discussed following up with intensive outpatient programming for mood stabilization.  Will initiate Wellbutrin for motivation depression and anxiety.  Collaboration of Care: Medication Management AEB patient to start Wellbutrin 100 mg daily-consideration for individual and group therapy sessions  Patient/Guardian was advised Release of Information must be obtained prior to any record release in order to collaborate their care with an outside provider. Patient/Guardian was advised if they have not already done so to contact the registration department to sign all necessary forms in order for us  to release information regarding their care.   Consent: Patient/Guardian gives verbal consent for treatment and assignment of benefits for services provided during this visit. Patient/Guardian expressed understanding and agreed to proceed.   Levester Reagin, NP 5/28/20253:28 PM

## 2023-11-21 ENCOUNTER — Telehealth (HOSPITAL_COMMUNITY): Payer: Self-pay | Admitting: Psychiatry

## 2023-11-21 NOTE — Telephone Encounter (Signed)
 D:  Dan Dun, NP referred pt to virtual MH-IOP.  A:  Placed call to orient pt.  Pt declined stating she would prefer a therapist.  Case Manager will inform the front desk to give pt a call to schedule her with a therapist.  Encouraged pt to give case mgr a call if she changes her mind in the future.  Inform Tanika.  R:  Pt receptive.

## 2023-11-22 DIAGNOSIS — F411 Generalized anxiety disorder: Secondary | ICD-10-CM | POA: Diagnosis not present

## 2023-11-22 DIAGNOSIS — F5101 Primary insomnia: Secondary | ICD-10-CM | POA: Diagnosis not present

## 2023-11-22 DIAGNOSIS — F321 Major depressive disorder, single episode, moderate: Secondary | ICD-10-CM | POA: Diagnosis not present

## 2023-11-25 ENCOUNTER — Other Ambulatory Visit (HOSPITAL_COMMUNITY): Payer: Self-pay

## 2023-11-27 ENCOUNTER — Encounter: Payer: Self-pay | Admitting: Genetic Counselor

## 2023-11-29 ENCOUNTER — Other Ambulatory Visit: Payer: Self-pay

## 2023-11-29 ENCOUNTER — Other Ambulatory Visit (HOSPITAL_COMMUNITY): Payer: Self-pay

## 2023-11-29 NOTE — Progress Notes (Signed)
 LVM- medication on rts

## 2023-12-03 ENCOUNTER — Other Ambulatory Visit (HOSPITAL_COMMUNITY): Payer: Self-pay

## 2023-12-04 ENCOUNTER — Ambulatory Visit: Admitting: Family

## 2023-12-04 ENCOUNTER — Other Ambulatory Visit (HOSPITAL_COMMUNITY): Payer: Self-pay

## 2023-12-05 ENCOUNTER — Ambulatory Visit: Payer: Self-pay | Admitting: Obstetrics and Gynecology

## 2023-12-05 DIAGNOSIS — Z148 Genetic carrier of other disease: Secondary | ICD-10-CM

## 2023-12-09 ENCOUNTER — Other Ambulatory Visit: Payer: Self-pay

## 2023-12-09 ENCOUNTER — Other Ambulatory Visit (HOSPITAL_COMMUNITY): Payer: Self-pay

## 2023-12-09 NOTE — Progress Notes (Signed)
 Patient at [facility] to Pick up spoke with Jazmine from [facility]. Medication could not be located at [facility] but they have medication in-stock

## 2023-12-10 DIAGNOSIS — K8020 Calculus of gallbladder without cholecystitis without obstruction: Secondary | ICD-10-CM | POA: Diagnosis not present

## 2023-12-10 DIAGNOSIS — R1084 Generalized abdominal pain: Secondary | ICD-10-CM | POA: Diagnosis not present

## 2023-12-10 DIAGNOSIS — R600 Localized edema: Secondary | ICD-10-CM | POA: Diagnosis not present

## 2023-12-10 DIAGNOSIS — S93402A Sprain of unspecified ligament of left ankle, initial encounter: Secondary | ICD-10-CM | POA: Diagnosis not present

## 2023-12-10 DIAGNOSIS — S96912A Strain of unspecified muscle and tendon at ankle and foot level, left foot, initial encounter: Secondary | ICD-10-CM | POA: Diagnosis not present

## 2023-12-10 DIAGNOSIS — M25572 Pain in left ankle and joints of left foot: Secondary | ICD-10-CM | POA: Diagnosis not present

## 2023-12-11 ENCOUNTER — Other Ambulatory Visit: Payer: Self-pay | Admitting: General Surgery

## 2023-12-11 ENCOUNTER — Ambulatory Visit
Admission: RE | Admit: 2023-12-11 | Discharge: 2023-12-11 | Disposition: A | Discharge status: Home / Self Care | Source: Ambulatory Visit | Attending: Nurse Practitioner | Admitting: Nurse Practitioner

## 2023-12-11 ENCOUNTER — Other Ambulatory Visit: Payer: Self-pay | Admitting: Nurse Practitioner

## 2023-12-11 DIAGNOSIS — R1084 Generalized abdominal pain: Secondary | ICD-10-CM

## 2023-12-11 DIAGNOSIS — M7989 Other specified soft tissue disorders: Secondary | ICD-10-CM | POA: Diagnosis not present

## 2023-12-11 DIAGNOSIS — M25572 Pain in left ankle and joints of left foot: Secondary | ICD-10-CM

## 2023-12-12 ENCOUNTER — Other Ambulatory Visit: Payer: Self-pay

## 2023-12-18 ENCOUNTER — Other Ambulatory Visit (HOSPITAL_COMMUNITY): Payer: Self-pay

## 2023-12-18 ENCOUNTER — Other Ambulatory Visit: Payer: Self-pay

## 2023-12-19 ENCOUNTER — Other Ambulatory Visit (HOSPITAL_COMMUNITY): Payer: Self-pay

## 2023-12-20 ENCOUNTER — Other Ambulatory Visit

## 2023-12-20 DIAGNOSIS — M79672 Pain in left foot: Secondary | ICD-10-CM | POA: Diagnosis not present

## 2023-12-20 DIAGNOSIS — M25572 Pain in left ankle and joints of left foot: Secondary | ICD-10-CM | POA: Diagnosis not present

## 2023-12-22 ENCOUNTER — Ambulatory Visit: Payer: Self-pay

## 2023-12-26 ENCOUNTER — Ambulatory Visit: Admitting: Podiatry

## 2023-12-30 ENCOUNTER — Other Ambulatory Visit: Payer: Self-pay

## 2023-12-30 DIAGNOSIS — M25572 Pain in left ankle and joints of left foot: Secondary | ICD-10-CM | POA: Diagnosis not present

## 2023-12-30 DIAGNOSIS — S93402A Sprain of unspecified ligament of left ankle, initial encounter: Secondary | ICD-10-CM | POA: Diagnosis not present

## 2023-12-30 DIAGNOSIS — F411 Generalized anxiety disorder: Secondary | ICD-10-CM | POA: Diagnosis not present

## 2023-12-30 DIAGNOSIS — M25472 Effusion, left ankle: Secondary | ICD-10-CM | POA: Diagnosis not present

## 2023-12-30 DIAGNOSIS — Z113 Encounter for screening for infections with a predominantly sexual mode of transmission: Secondary | ICD-10-CM | POA: Diagnosis not present

## 2023-12-30 DIAGNOSIS — Z1159 Encounter for screening for other viral diseases: Secondary | ICD-10-CM | POA: Diagnosis not present

## 2023-12-30 DIAGNOSIS — S96912A Strain of unspecified muscle and tendon at ankle and foot level, left foot, initial encounter: Secondary | ICD-10-CM | POA: Diagnosis not present

## 2023-12-30 DIAGNOSIS — F321 Major depressive disorder, single episode, moderate: Secondary | ICD-10-CM | POA: Diagnosis not present

## 2023-12-30 DIAGNOSIS — Z21 Asymptomatic human immunodeficiency virus [HIV] infection status: Secondary | ICD-10-CM | POA: Diagnosis not present

## 2023-12-30 DIAGNOSIS — Z9189 Other specified personal risk factors, not elsewhere classified: Secondary | ICD-10-CM | POA: Diagnosis not present

## 2024-01-01 ENCOUNTER — Other Ambulatory Visit: Payer: Self-pay

## 2024-01-01 ENCOUNTER — Other Ambulatory Visit: Payer: Self-pay | Admitting: Pharmacy Technician

## 2024-01-01 ENCOUNTER — Ambulatory Visit
Admission: RE | Admit: 2024-01-01 | Discharge: 2024-01-01 | Disposition: A | Discharge status: Home / Self Care | Source: Ambulatory Visit | Attending: General Surgery | Admitting: General Surgery

## 2024-01-01 DIAGNOSIS — R109 Unspecified abdominal pain: Secondary | ICD-10-CM | POA: Diagnosis not present

## 2024-01-01 DIAGNOSIS — R1084 Generalized abdominal pain: Secondary | ICD-10-CM

## 2024-01-01 DIAGNOSIS — K8020 Calculus of gallbladder without cholecystitis without obstruction: Secondary | ICD-10-CM | POA: Diagnosis not present

## 2024-01-01 NOTE — Progress Notes (Signed)
 Specialty Pharmacy Refill Coordination Note  Brenda Tyler is a 38 y.o. female contacted today regarding refills of specialty medication(s) Darun-Cobic-Emtricit-TenofAF (Symtuza )   Patient requested [name] at [facility] at [place] date: 01/07/24   Medication will be filled on 01/07/24.

## 2024-01-02 ENCOUNTER — Ambulatory Visit (INDEPENDENT_AMBULATORY_CARE_PROVIDER_SITE_OTHER): Admitting: Licensed Clinical Social Worker

## 2024-01-02 DIAGNOSIS — Z91199 Patient's noncompliance with other medical treatment and regimen due to unspecified reason: Secondary | ICD-10-CM

## 2024-01-02 NOTE — Progress Notes (Signed)
 THERAPIST PROGRESS NOTE   Session Date: 01/02/2024  Session Time: 0800  Patient no-showed today's appointment; appointment was for intake [facility] appt to establish care, provider notified for review of record.     Lynwood JONETTA Maris, MSW, LCSW 01/02/2024,  8:17 AM

## 2024-01-07 ENCOUNTER — Other Ambulatory Visit: Payer: Self-pay

## 2024-01-11 DIAGNOSIS — M25572 Pain in left ankle and joints of left foot: Secondary | ICD-10-CM | POA: Diagnosis not present

## 2024-01-11 DIAGNOSIS — M25472 Effusion, left ankle: Secondary | ICD-10-CM | POA: Diagnosis not present

## 2024-01-14 ENCOUNTER — Encounter: Payer: Self-pay | Admitting: Nurse Practitioner

## 2024-01-17 ENCOUNTER — Other Ambulatory Visit: Payer: Self-pay

## 2024-01-20 DIAGNOSIS — M25472 Effusion, left ankle: Secondary | ICD-10-CM | POA: Diagnosis not present

## 2024-01-21 ENCOUNTER — Other Ambulatory Visit (HOSPITAL_COMMUNITY): Payer: Self-pay

## 2024-01-21 ENCOUNTER — Other Ambulatory Visit: Payer: Self-pay

## 2024-01-27 ENCOUNTER — Other Ambulatory Visit: Payer: Self-pay

## 2024-01-30 ENCOUNTER — Ambulatory Visit: Admitting: Podiatry

## 2024-02-06 ENCOUNTER — Ambulatory Visit: Admitting: Podiatry

## 2024-02-10 ENCOUNTER — Other Ambulatory Visit: Payer: Self-pay

## 2024-02-13 ENCOUNTER — Other Ambulatory Visit (HOSPITAL_COMMUNITY): Payer: Self-pay

## 2024-02-15 ENCOUNTER — Telehealth: Admitting: Nurse Practitioner

## 2024-02-15 DIAGNOSIS — U071 COVID-19: Secondary | ICD-10-CM | POA: Diagnosis not present

## 2024-02-15 MED ORDER — AZITHROMYCIN 250 MG PO TABS: ORAL_TABLET | ORAL | 0 refills | Status: AC

## 2024-02-15 MED ORDER — PROMETHAZINE-DM 6.25-15 MG/5ML PO SYRP: 5.0000 mL | ORAL_SOLUTION | Freq: Four times a day (QID) | ORAL | 0 refills | Status: DC | PRN

## 2024-02-15 MED ORDER — PREDNISONE 20 MG PO TABS: 40.0000 mg | ORAL_TABLET | Freq: Every day | ORAL | 0 refills | Status: AC

## 2024-02-15 NOTE — Progress Notes (Signed)
 Virtual Visit Consent   Brenda Tyler, you are scheduled for a virtual visit with a [place] provider today. Just as with appointments in the office, your consent must be obtained to participate. Your consent will be active for this visit and any virtual visit you may have with one of our providers in the next 365 days. If you have a MyChart account, a copy of this consent can be sent to you electronically.  As this is a virtual visit, video technology does not allow for your provider to perform a traditional examination. This may limit your provider's ability to fully assess your condition. If your provider identifies any concerns that need to be evaluated in person or the need to arrange testing (such as labs, EKG, etc.), we will make arrangements to do so. Although advances in technology are sophisticated, we cannot ensure that it will always work on either your end or our end. If the connection with a video visit is poor, the visit may have to be switched to a telephone visit. With either a video or telephone visit, we are not always able to ensure that we have a secure connection.  By engaging in this virtual visit, you consent to the provision of healthcare and authorize for your insurance to be billed (if applicable) for the services provided during this visit. Depending on your insurance coverage, you may receive a charge related to this service.  I need to obtain your verbal consent now. Are you willing to proceed with your visit today? Brenda Tyler has provided verbal consent on 02/15/2024 for a virtual visit (video or telephone). Brenda LELON Servant, NP  Date: 02/15/2024 5:11 PM   Virtual Visit via Video Note   I, Brenda Tyler, connected with  Brenda Tyler  ([id number], [birth year]) on 02/15/24 at  5:00 PM EDT by a video-enabled telemedicine application and verified that I am speaking with the correct person using two identifiers.  Location: Patient: Virtual Visit Location Patient:  Home Provider: Virtual Visit Location Provider: Home Office   I discussed the limitations of evaluation and management by telemedicine and the availability of in person appointments. The patient expressed understanding and agreed to proceed.    History of Present Illness: Brenda Tyler is a 38 y.o. who identifies as a female who was assigned female at birth, and is being seen today for post COVID-positive symptoms.  Brenda Tyler was self diagnosed with COVID via home antigen test on August 6.  She did not inform her PCP or start an antiviral at that time.  Over the past week her symptoms have worsened and currently include chest pain with coughing, shortness of breath, dyspnea on exertion, nasal and chest congestion.  She does not have a history of asthma or smoking.  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   Breast pain 01/31/2023   Healthcare maintenance 01/31/2023   Pre-diabetes 11/02/2020   Vitamin D  deficiency 11/02/2020   At risk for diabetes mellitus 11/02/2020   Acute intractable headache 02/23/2020   Dizziness and giddiness 02/23/2020   Cyst of bone of left hand 12/10/2019   Dislocation of knee, anterior, left, closed 01/21/2016  Closed anterior dislocation of left knee 01/21/2016   Depression 07/26/2014   Herpes labialis 07/03/2014   HIV disease (HCC) 04/23/2014   Severe obesity (HCC) 12/22/2012    Allergies:  Allergies  Allergen Reactions   Dilaudid  [Hydromorphone  Hcl] Other (See Comments)    Makes her dizzy   Medications:  Current Outpatient Medications:    azithromycin  (ZITHROMAX ) 250 MG tablet, Take 2 tablets on day 1, then 1 tablet daily on days 2 through 5, Disp: 6  tablet, Rfl: 0   predniSONE  (DELTASONE ) 20 MG tablet, Take 2 tablets (40 mg total) by mouth daily with breakfast for 5 days., Disp: 10 tablet, Rfl: 0   promethazine -dextromethorphan (PROMETHAZINE -DM) 6.25-15 MG/5ML syrup, Take 5 mLs by mouth 4 (four) times daily as needed., Disp: 240 mL, Rfl: 0   buPROPion  ER (WELLBUTRIN  SR) 100 MG 12 hr tablet, Take 1 tablet (100 mg total) by mouth daily., Disp: 60 tablet, Rfl: 2   Darunavir -Cobicistat-Emtricitabine-Tenofovir Alafenamide (SYMTUZA ) 800-150-200-10 MG TABS, Take 1 tablet by mouth daily with breakfast., Disp: 30 tablet, Rfl: 5   hydrOXYzine (ATARAX) 25 MG tablet, Take 25 mg by mouth at bedtime., Disp: , Rfl:    ibuprofen  (ADVIL ) 800 MG tablet, Take 1 tablet (800 mg total) by mouth 3 (three) times daily with meals as needed for headache, moderate pain (pain score 4-6) or cramping. (Patient not taking: Reported on 11/20/2023), Disp: 60 tablet, Rfl: 0   medroxyPROGESTERone  (PROVERA ) 10 MG tablet, Take 1 tablet (10 mg total) by mouth daily. (Patient not taking: Reported on 11/20/2023), Disp: 90 tablet, Rfl: 0   omeprazole  (PRILOSEC) 40 MG capsule, Take 1 capsule (40 mg total) by mouth daily. (Patient not taking: Reported on 11/20/2023), Disp: 30 capsule, Rfl: 2  Observations/Objective: Patient is well-developed, well-nourished in no acute distress.  Resting comfortably at home.  Head is normocephalic, atraumatic.  No labored breathing.  Speech is clear and coherent with logical content.  Patient is alert and oriented at baseline.    Assessment and Plan: 1. Positive self-administered antigen test for COVID-19 (Primary) - promethazine -dextromethorphan (PROMETHAZINE -DM) 6.25-15 MG/5ML syrup; Take 5 mLs by mouth 4 (four) times daily as needed.  Dispense: 240 mL; Refill: 0 - azithromycin  (ZITHROMAX ) 250 MG tablet; Take 2 tablets on day 1, then 1 tablet daily on days 2 through 5  Dispense: 6 tablet; Refill: 0 - predniSONE  (DELTASONE ) 20 MG tablet; Take 2  tablets (40 mg total) by mouth daily with breakfast for 5 days.  Dispense: 10 tablet; Refill: 0   Please keep well-hydrated and get plenty of rest. Start a saline nasal rinse to flush out your nasal passages. You can use plain Mucinex to help thin congestion. If you have a humidifier, you can use this daily as needed.    You are to wear a mask for 5 days from onset of your symptoms.  After day 5, if you have had no fever and you are feeling better with NO symptoms, you can end masking. Keep in mind you can be contagious 10 days from the onset of symptoms  After day 5 if you have a fever or are having significant symptoms, please wear your mask for full 10 days.   If you note any worsening of symptoms, any significant shortness of breath or any chest pain, please seek ER evaluation ASAP.  Please do not delay care!    If you note any worsening of symptoms, any significant shortness of breath or any chest pain, please seek ER evaluation ASAP.  Please do not delay care!   Follow Up Instructions: I discussed the assessment and treatment plan with the patient. The patient was provided an opportunity to ask questions and all were answered. The patient agreed with the plan and demonstrated an understanding of the instructions.  A copy of instructions were sent to the patient via MyChart unless otherwise noted below.    The patient was advised to call back or seek an in-person evaluation if the symptoms worsen or if the condition fails to improve as anticipated.    Greig Altergott W Arjuna Doeden, NP

## 2024-02-15 NOTE — Patient Instructions (Signed)
 Nat KATHEE Roses, thank you for joining Haze LELON Servant, NP for today's virtual visit.  While this provider is not your primary care provider (PCP), if your PCP is located in our provider database this encounter information will be shared with them immediately following your visit.   A [facility] MyChart account gives you access to today's visit and all your visits, tests, and labs performed at [facility]  click here if you don't have a [facility] MyChart account or go to [email or web address]  Consent: (Patient) VERYL ABRIL provided verbal consent for this virtual visit at the beginning of the encounter.  Current Medications:  Current Outpatient Medications:    azithromycin  (ZITHROMAX ) 250 MG tablet, Take 2 tablets on day 1, then 1 tablet daily on days 2 through 5, Disp: 6 tablet, Rfl: 0   predniSONE  (DELTASONE ) 20 MG tablet, Take 2 tablets (40 mg total) by mouth daily with breakfast for 5 days., Disp: 10 tablet, Rfl: 0   promethazine -dextromethorphan (PROMETHAZINE -DM) 6.25-15 MG/5ML syrup, Take 5 mLs by mouth 4 (four) times daily as needed., Disp: 240 mL, Rfl: 0   buPROPion  ER (WELLBUTRIN  SR) 100 MG 12 hr tablet, Take 1 tablet (100 mg total) by mouth daily., Disp: 60 tablet, Rfl: 2   Darunavir -Cobicistat-Emtricitabine-Tenofovir Alafenamide (SYMTUZA ) 800-150-200-10 MG TABS, Take 1 tablet by mouth daily with breakfast., Disp: 30 tablet, Rfl: 5   hydrOXYzine (ATARAX) 25 MG tablet, Take 25 mg by mouth at bedtime., Disp: , Rfl:    ibuprofen  (ADVIL ) 800 MG tablet, Take 1 tablet (800 mg total) by mouth 3 (three) times daily with meals as needed for headache, moderate pain (pain score 4-6) or cramping. (Patient not taking: Reported on 11/20/2023), Disp: 60 tablet, Rfl: 0   medroxyPROGESTERone  (PROVERA ) 10 MG tablet, Take 1 tablet (10 mg total) by mouth daily. (Patient not taking: Reported on 11/20/2023), Disp: 90 tablet, Rfl: 0   omeprazole  (PRILOSEC) 40 MG capsule, Take 1 capsule  (40 mg total) by mouth daily. (Patient not taking: Reported on 11/20/2023), Disp: 30 capsule, Rfl: 2   Medications ordered in this encounter:  Meds ordered this encounter  Medications   promethazine -dextromethorphan (PROMETHAZINE -DM) 6.25-15 MG/5ML syrup    Sig: Take 5 mLs by mouth 4 (four) times daily as needed.    Dispense:  240 mL    Refill:  0    Supervising Provider:   BLAISE ALEENE KIDD [[id number]]   azithromycin  (ZITHROMAX ) 250 MG tablet    Sig: Take 2 tablets on day 1, then 1 tablet daily on days 2 through 5    Dispense:  6 tablet    Refill:  0    Supervising Provider:   LAMPTEY, PHILIP O [[id number]]   predniSONE  (DELTASONE ) 20 MG tablet    Sig: Take 2 tablets (40 mg total) by mouth daily with breakfast for 5 days.    Dispense:  10 tablet    Refill:  0    Supervising Provider:   BLAISE ALEENE KIDD [[id number]]     *If you need refills on other medications prior to your next appointment, please contact your pharmacy*  Follow-Up: Call back or seek an in-person evaluation if the symptoms worsen or if the condition fails to improve as anticipated.  [facility] [phone]  Other Instructions  Please keep well-hydrated and get plenty of rest. Start a saline nasal rinse to flush out your nasal passages. You can use plain Mucinex to help thin congestion. If you have a humidifier, you can use  this daily as needed.    You are to wear a mask for 5 days from onset of your symptoms.  After day 5, if you have had no fever and you are feeling better with NO symptoms, you can end masking. Keep in mind you can be contagious 10 days from the onset of symptoms  After day 5 if you have a fever or are having significant symptoms, please wear your mask for full 10 days.   If you note any worsening of symptoms, any significant shortness of breath or any chest pain, please seek ER evaluation ASAP.  Please do not delay care!    If you note any worsening of symptoms, any significant  shortness of breath or any chest pain, please seek ER evaluation ASAP.  Please do not delay care!    If you have been instructed to have an in-person evaluation today at a local Urgent Care facility, please use the link below. It will take you to a list of all of our available [facility], including address, phone number and hours of operation. Please do not delay care.  [facility]  If you or a family member do not have a primary care provider, use the link below to schedule a visit and establish care. When you choose a [facility] primary care physician or advanced practice provider, you gain a long-term partner in health. Find a Primary Care Provider  Learn more about [facility]'s in-office and virtual care options: [facility] - Get Care Now

## 2024-02-19 ENCOUNTER — Other Ambulatory Visit: Payer: Self-pay | Admitting: Obstetrics and Gynecology

## 2024-02-19 DIAGNOSIS — B9689 Other specified bacterial agents as the cause of diseases classified elsewhere: Secondary | ICD-10-CM

## 2024-03-03 ENCOUNTER — Ambulatory Visit: Payer: Self-pay | Admitting: Surgery

## 2024-03-03 DIAGNOSIS — F411 Generalized anxiety disorder: Secondary | ICD-10-CM | POA: Diagnosis not present

## 2024-03-03 DIAGNOSIS — R7303 Prediabetes: Secondary | ICD-10-CM | POA: Diagnosis not present

## 2024-03-03 DIAGNOSIS — E559 Vitamin D deficiency, unspecified: Secondary | ICD-10-CM | POA: Diagnosis not present

## 2024-03-03 DIAGNOSIS — Z6841 Body Mass Index (BMI) 40.0 and over, adult: Secondary | ICD-10-CM | POA: Diagnosis not present

## 2024-03-03 DIAGNOSIS — Z113 Encounter for screening for infections with a predominantly sexual mode of transmission: Secondary | ICD-10-CM | POA: Diagnosis not present

## 2024-03-03 DIAGNOSIS — S96912A Strain of unspecified muscle and tendon at ankle and foot level, left foot, initial encounter: Secondary | ICD-10-CM | POA: Diagnosis not present

## 2024-03-03 DIAGNOSIS — Z23 Encounter for immunization: Secondary | ICD-10-CM | POA: Diagnosis not present

## 2024-03-03 DIAGNOSIS — Z21 Asymptomatic human immunodeficiency virus [HIV] infection status: Secondary | ICD-10-CM | POA: Diagnosis not present

## 2024-03-03 DIAGNOSIS — E66813 Obesity, class 3: Secondary | ICD-10-CM | POA: Diagnosis not present

## 2024-03-03 DIAGNOSIS — R6889 Other general symptoms and signs: Secondary | ICD-10-CM | POA: Diagnosis not present

## 2024-03-03 DIAGNOSIS — K219 Gastro-esophageal reflux disease without esophagitis: Secondary | ICD-10-CM | POA: Diagnosis not present

## 2024-03-03 DIAGNOSIS — Z1159 Encounter for screening for other viral diseases: Secondary | ICD-10-CM | POA: Diagnosis not present

## 2024-03-03 DIAGNOSIS — R600 Localized edema: Secondary | ICD-10-CM | POA: Diagnosis not present

## 2024-03-03 DIAGNOSIS — M25572 Pain in left ankle and joints of left foot: Secondary | ICD-10-CM | POA: Diagnosis not present

## 2024-03-03 DIAGNOSIS — Z01810 Encounter for preprocedural cardiovascular examination: Secondary | ICD-10-CM | POA: Diagnosis not present

## 2024-03-03 DIAGNOSIS — Z79899 Other long term (current) drug therapy: Secondary | ICD-10-CM | POA: Diagnosis not present

## 2024-03-03 DIAGNOSIS — M25472 Effusion, left ankle: Secondary | ICD-10-CM | POA: Diagnosis not present

## 2024-03-03 DIAGNOSIS — S93402A Sprain of unspecified ligament of left ankle, initial encounter: Secondary | ICD-10-CM | POA: Diagnosis not present

## 2024-03-03 DIAGNOSIS — Z7189 Other specified counseling: Secondary | ICD-10-CM | POA: Diagnosis not present

## 2024-03-03 DIAGNOSIS — E782 Mixed hyperlipidemia: Secondary | ICD-10-CM | POA: Diagnosis not present

## 2024-03-03 DIAGNOSIS — F5101 Primary insomnia: Secondary | ICD-10-CM | POA: Diagnosis not present

## 2024-03-05 ENCOUNTER — Other Ambulatory Visit (HOSPITAL_COMMUNITY): Payer: Self-pay | Admitting: Surgery

## 2024-03-06 DIAGNOSIS — E782 Mixed hyperlipidemia: Secondary | ICD-10-CM | POA: Diagnosis not present

## 2024-03-06 DIAGNOSIS — E569 Vitamin deficiency, unspecified: Secondary | ICD-10-CM | POA: Diagnosis not present

## 2024-03-08 NOTE — Progress Notes (Deleted)
 03/08/2024 ROGENA DEUPREE [id number] September 23, 1985   CHIEF COMPLAINT: Abdominal pain   HISTORY OF PRESENT ILLNESS:Deanndra B. Goosby is a 38 year old female with a past medical history of anxiety, obesity, HIV positive and gallstones.   She presents to our office today as referred by Dr. Richerd Silversmith for further evaluation regarding abdominal pain.  She is undergoing evaluation for bariatric surgery.  Evaluated by Dr. Deward Foy 03/03/2024.  - Upper endoscopy with biopsy. I explained my rational for performing upper endoscopy in my bariatric patients. During the procedure I will biopsy for H. Pylori, evaluate for hiatal hernia, evaluate for reflux esophagitis and look for any other abnormalities that may influence the procedure. We discussed the risks, benefits and alternative to this procedure and the patient granted consent to proceed.  After the above evaluation is complete, we will continue our discussion and work towards scheduling surgery.  Patient has a history of gallstones and symptoms of billiary colic. I would recommend considering cholecystectomy at the time of gastric bypass.       Latest Ref Rng & Units 10/29/2023   12:45 PM 08/27/2023   11:25 AM 01/25/2022   10:31 AM  CBC  WBC 4.0 - 10.5 K/uL 3.7  5.0  4.0   Hemoglobin 12.0 - 15.0 g/dL 87.3  86.5  87.7   Hematocrit 36.0 - 46.0 % 40.0  40.7  37.0   Platelets 150 - 400 K/uL 294  317  344        Latest Ref Rng & Units 11/12/2023   10:17 AM 05/03/2023   11:19 AM 01/31/2023   10:10 AM  CMP  Glucose 65 - 99 mg/dL 87  87  91   BUN 7 - 25 mg/dL 8  17  14    Creatinine 0.50 - 0.97 mg/dL 9.21  9.23  9.12   Sodium 135 - 146 mmol/L 139  141  141   Potassium 3.5 - 5.3 mmol/L 3.8  4.1  4.1   Chloride 98 - 110 mmol/L 104  106  106   CO2 20 - 32 mmol/L 27  28  28    Calcium 8.6 - 10.2 mg/dL 9.4  9.6  9.5   Total Protein 6.1 - 8.1 g/dL 7.4  7.6  7.5   Total Bilirubin 0.2 - 1.2 mg/dL 0.5  0.4  0.3   AST 10 - 30 U/L 13  11   12    ALT 6 - 29 U/L 16  14  11      CTAP without contrast 01/01/2024: FINDINGS: Lower chest: No acute abnormality.   Hepatobiliary: A small gallstone present. The gallbladder and bile ducts are otherwise within normal limits. The liver is within normal limits.   Pancreas: Unremarkable. No pancreatic ductal dilatation or surrounding inflammatory changes.   Spleen: Normal in size without focal abnormality.   Adrenals/Urinary Tract: Adrenal glands are unremarkable. Kidneys are normal, without renal calculi, focal lesion, or hydronephrosis. Bladder is unremarkable.   Stomach/Bowel: Stomach is within normal limits. Appendix appears normal. No evidence of bowel wall thickening, distention, or inflammatory changes.   Vascular/Lymphatic: No significant vascular findings are present. No enlarged abdominal or pelvic lymph nodes.   Reproductive: Uterus and bilateral adnexa are unremarkable.   Other: No abdominal wall hernia or abnormality. No abdominopelvic ascites.   Musculoskeletal: No acute or significant osseous findings.   IMPRESSION: 1. No acute localizing process in the abdomen or pelvis. 2. Cholelithiasis.    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   HSIL s/p Colposcopy at 12/14 - CINI LSIL on pap 4/15 - s/p Colpo 11/02/13 - CIN II -->    HIV (human immunodeficiency virus infection) (HCC)    HSV (herpes simplex virus) infection    Obesity    Oropharyngeal candidiasis 07/05/2014   Other fatigue    Shortness of breath on exertion    Past Surgical History:  Procedure Laterality Date   ANKLE SURGERY Right    COLPOSCOPY N/A 10/29/2023   Procedure: COLPOSCOPY;  Surgeon: Cleatus Moccasin, MD;  Location: [facility] OR;  Service: Gynecology;  Laterality: N/A;   HYSTEROSCOPY WITH D & C N/A 10/29/2023   Procedure: DILATATION AND CURETTAGE /HYSTEROSCOPY;  Surgeon:  Cleatus Moccasin, MD;  Location: [facility] OR;  Service: Gynecology;  Laterality: N/A;   INCISION AND DRAINAGE ABSCESS     on abdomen and buttocks   Social History:  Family History:    reports that she has never smoked. She has never used smokeless tobacco. She reports that she does not currently use alcohol after a past usage of about 2.0 standard drinks of alcohol per week. She reports that she does not currently use drugs. family history includes Autism spectrum disorder in an other family member; Cancer in her maternal grandmother; Diabetes in her maternal grandmother and paternal grandmother; Heart disease in her maternal grandmother, mother, and paternal grandmother; Heart failure in her paternal grandmother; Hypertension in her father, maternal grandmother, and paternal grandmother; Learning disabilities in her son and son; Prostate cancer in her paternal grandfather; Sickle cell trait in her son; Stroke in her mother; Sudden death in her mother. Allergies  Allergen Reactions   Dilaudid  [Hydromorphone  Hcl] Other (See Comments)    Makes her dizzy      Outpatient Encounter Medications as of 03/09/2024  Medication Sig   buPROPion  ER (WELLBUTRIN  SR) 100 MG 12 hr tablet Take 1 tablet (100 mg total) by mouth daily.   Darunavir -Cobicistat-Emtricitabine-Tenofovir Alafenamide (SYMTUZA ) 800-150-200-10 MG TABS Take 1 tablet by mouth daily with breakfast.   hydrOXYzine (ATARAX) 25 MG tablet Take 25 mg by mouth at bedtime.   ibuprofen  (ADVIL ) 800 MG tablet Take 1 tablet (800 mg total) by mouth 3 (three) times daily with meals as needed for headache, moderate pain (pain score 4-6) or cramping. (Patient not taking: Reported on 11/20/2023)   medroxyPROGESTERone  (PROVERA ) 10 MG tablet Take 1 tablet (10 mg total) by mouth daily. (Patient not taking: Reported on 11/20/2023)   omeprazole  (PRILOSEC) 40 MG capsule Take 1 capsule (40 mg total) by mouth daily. (Patient not taking: Reported on 11/20/2023)    promethazine -dextromethorphan (PROMETHAZINE -DM) 6.25-15 MG/5ML syrup Take 5 mLs by mouth 4 (four) times daily as needed.   No facility-administered encounter medications on file as of 03/09/2024.     REVIEW OF SYSTEMS:  Gen: Denies fever, sweats or chills. No weight loss.  CV: Denies chest pain, palpitations or edema. Resp: Denies cough, shortness of breath of hemoptysis.  GI: Denies heartburn, dysphagia, stomach or lower abdominal pain. No diarrhea or constipation.  GU: Denies urinary burning, blood in urine, increased urinary frequency or incontinence. MS: Denies joint pain, muscles aches or weakness. Derm: Denies rash, itchiness, skin lesions or unhealing ulcers. Psych: Denies depression, anxiety, memory loss or confusion. Heme: Denies bruising, easy bleeding. Neuro:  Denies headaches, dizziness or paresthesias. Endo:  Denies any problems with DM, thyroid  or  adrenal function.  PHYSICAL EXAM: There were no vitals taken for this visit. General: in no acute distress. Head: Normocephalic and atraumatic. Eyes:  Sclerae non-icteric, conjunctive pink. Ears: Normal auditory acuity. Mouth: Dentition intact. No ulcers or lesions.  Neck: Supple, no lymphadenopathy or thyromegaly.  Lungs: Clear bilaterally to auscultation without wheezes, crackles or rhonchi. Heart: Regular rate and rhythm. No murmur, rub or gallop appreciated.  Abdomen: Soft, nontender, nondistended. No masses. No hepatosplenomegaly. Normoactive bowel sounds x 4 quadrants.  Rectal: Deferred.  Musculoskeletal: Symmetrical with no gross deformities. Skin: Warm and dry. No rash or lesions on visible extremities. Extremities: No edema. Neurological: Alert oriented x 4, no focal deficits.  Psychological: Alert and cooperative. Normal mood and affect.  ASSESSMENT AND PLAN:    CC:  Lenon Nell SAILOR, FNP

## 2024-03-09 ENCOUNTER — Ambulatory Visit: Admitting: Nurse Practitioner

## 2024-03-11 ENCOUNTER — Encounter (HOSPITAL_COMMUNITY)
Admission: RE | Admit: 2024-03-11 | Discharge: 2024-03-11 | Disposition: A | Discharge status: Home / Self Care | Source: Ambulatory Visit | Attending: Surgery | Admitting: Surgery

## 2024-03-11 ENCOUNTER — Ambulatory Visit (HOSPITAL_COMMUNITY)
Admission: RE | Admit: 2024-03-11 | Discharge: 2024-03-11 | Disposition: A | Discharge status: Home / Self Care | Source: Ambulatory Visit | Attending: Surgery | Admitting: Surgery

## 2024-03-11 ENCOUNTER — Other Ambulatory Visit: Payer: Self-pay

## 2024-03-11 DIAGNOSIS — Z01818 Encounter for other preprocedural examination: Secondary | ICD-10-CM | POA: Insufficient documentation

## 2024-03-12 ENCOUNTER — Other Ambulatory Visit (HOSPITAL_COMMUNITY): Payer: Self-pay

## 2024-03-12 ENCOUNTER — Other Ambulatory Visit: Payer: Self-pay

## 2024-03-12 NOTE — Progress Notes (Signed)
 Specialty Pharmacy Refill Coordination Note  Brenda Tyler is a 38 y.o. female contacted today regarding refills of specialty medication(s) Darun-Cobic-Emtricit-TenofAF (Symtuza )   Patient requested [name] at [facility] at [place] date: 03/12/24   Medication will be filled on 09.18.25.

## 2024-03-16 ENCOUNTER — Other Ambulatory Visit: Payer: Self-pay

## 2024-03-22 ENCOUNTER — Other Ambulatory Visit (HOSPITAL_COMMUNITY): Payer: Self-pay

## 2024-03-23 ENCOUNTER — Encounter: Payer: Self-pay | Admitting: Dietician

## 2024-03-23 ENCOUNTER — Encounter: Attending: Surgery | Admitting: Dietician

## 2024-03-23 VITALS — Ht 64.5 in | Wt 312.2 lb

## 2024-03-23 DIAGNOSIS — E669 Obesity, unspecified: Secondary | ICD-10-CM | POA: Diagnosis present

## 2024-03-23 NOTE — Progress Notes (Signed)
 Nutrition Assessment for Bariatric Surgery: Pre-Surgery Behavioral and Nutrition Intervention Program   Medical Nutrition Therapy  Appt Start Time: 1117    End Time: 1227  Patient was seen on 03/23/2024 for Pre-Operative Nutrition Assessment. Purpose of todays visit  enhance perioperative outcomes along with a healthy weight maintenance   Referral stated Supervised Weight Loss (SWL) visits needed: 3 months  Planned surgery: Gastric By-Pass Pt expectation of surgery: to get to at least 200 lbs and main goal to be healthy and have more energy  NUTRITION ASSESSMENT   Anthropometrics  Start weight at [facility]: 312.2 lbs (date: 03/23/2024)  Height: 64.5 in BMI: 52.76 kg/m2     Clinical  Medical hx: GERD, anxiety, HIV, obesity Medications: Vit D, symtuza   Labs: cholesterol 246, LDL 159 Notable signs/symptoms: non noted Any previous deficiencies? No  Evaluation of Nutritional Deficiencies: Micronutrient Nutrition Focused Physical Exam: Hair: No issues observed Eyes: No issues observed Mouth: No issues observed Neck: No issues observed Nails: No issues observed Skin: No issues observed  Lifestyle & Dietary Hx Pt states she used to go walking all the time, stating she doesn't as much. Pt states she might walk around [facility] twice.  Pt states she tries not to eat late at night.  Current Physical Activity Recommendations state 150 minutes per week of moderate to vigorous movement including Cardio and 1-2 days of resistance activities as well as flexibility/balance activities:  Pts current physical activity: ADL, with 0% recommendation reached   Sleep Hygiene: duration and quality: okay, but less than needed  Current Patient Perceived Stress Level as stated by pt on a scale of 1-10:  5       Stress Management Techniques: go to work or more sleep or go walking  According to the Dietary Guidelines for Americans Recommendation: equivalent 1.5-2 cups fruits per day, equivalent 2-3 cups  vegetables per day and at least half all grains whole  Fruit servings per day (on average): 0-1, meeting 0-50% recommendation  Non-starchy vegetable servings per day (on average): 0-1, meeting 0-50% recommendation  Whole Grains per day (on average): 0-1  Number of meals missed/skipped per week out of 21: 7-10  24-Hr Dietary Recall First Meal: skip or buiscuit, grits, bacon, eggs, fruit Snack:  Second Meal: left overs or fast food ([employer], [employer], or chicken) Snack: pretzels or fruit Third Meal: skip or snack on something Snack: jello or fruit or peanut butter butter crackers or small salad Beverages: juice, water, sweet tea, vitamin water, Gatorade (sometimes Gatorade zero)  Alcoholic beverages per week: 0   Estimated Energy Needs Calories: 1500  NUTRITION DIAGNOSIS  Overweight/obesity ([place]-3.3) related to past poor dietary habits and physical inactivity as evidenced by patient w/ planned by-pass surgery following dietary guidelines for continued weight loss.  NUTRITION INTERVENTION  Nutrition counseling (C-1) and education (E-2) to facilitate bariatric surgery goals.  Educated pt on micronutrient deficiencies post-surgery and behavioral/dietary strategies to start in order to mitigate that risk   Behavioral and Dietary Interventions Pre-Op Goals Reviewed with the Patient Nutrition: Healthy Eating Behaviors Switch to non-caloric, non-carbonated and non-caffeinated beverages such as  water, unsweetened tea, Crystal Light and zero calorie beverages (aim for 64 oz. per day) Cut out grazing between meals or at night; schedule meals and snacks.  Find a protein shake you like Eat every 3-5 hours        Eliminate distractions while eating (TV, computer, reading, driving, texting) Take 79-69 minutes to eat a meal  Decrease high sugar foods/decrease  high fat/fried foods Eliminate alcoholic beverages Increase protein intake (eggs, fish, chicken, yogurt) before surgery Eat non  starchy vegetables 2 times a day 7 days a week Eat complex carbohydrates such as whole grains and fruits   Behavioral Modification: Physical Activity Increase my usual daily activity (use stairs, park farther, etc.) Engage in _______________________  activity  _______ minutes ______ times per week  Other:    _________________________________________________________________     Problem Solving I will think about my usual eating patterns and how to tweak them How can my friends and family support me Barriers to starting my changes Learn and understand appetite verses hunger   Healthy Coping Allow for ___________ activities per week to help me manage stress Reframe negative thoughts I will keep a picture of someone or something that is my inspiration & look at it daily   Monitoring  Weigh myself once a week  Measure my progress by monitoring how my clothes fit Keep a food record of what I eat and drink for the next ________ (time period) Take pictures of what I eat and drink for the next ________ (time period) Use an app to count steps/day for the next_______ (time period) Measure my progress such as increased energy and more restful sleep Monitor your acid reflux and bowel habits, are they getting better?   *Goals that are bolded indicate the pt would like to start working towards these  Handouts Provided Include  Bariatric Surgery handouts (Nutrition Visits, Pre Surgery Behavioral Change Goals, Protein Shakes Brands to Choose From, Vitamins & Mineral Supplementation)  Learning Style & Readiness for Change Teaching method utilized: Visual, Auditory, and hands on  Demonstrated degree of understanding via: Teach Back  Readiness Level: preparation Barriers to learning/adherence to lifestyle change: nothing identified  RD's Notes for Next Visit Patient progress toward chosen goals.   MONITORING & EVALUATION Dietary intake, weekly physical activity, body weight, and preoperative  behavioral change goals   Next Steps  Patient is to follow up at [facility] in 2-3 weeks for first SWL visit.

## 2024-03-24 ENCOUNTER — Other Ambulatory Visit: Payer: Self-pay

## 2024-03-24 NOTE — Progress Notes (Signed)
 Specialty Pharmacy Ongoing Clinical Assessment Note  Brenda Tyler is a 38 y.o. female who is being followed by the specialty pharmacy service for RxSp HIV   Patient's specialty medication(s) reviewed today: Darun-Cobic-Emtricit-TenofAF (Symtuza )   Missed doses in the last 4 weeks: 0 (patient reports that she found an old bottle and denies missed doses, though her refill history indicates otherwise)   Patient/Caregiver did not have any additional questions or concerns.   Therapeutic benefit summary: Patient is achieving benefit   Adverse events/side effects summary: No adverse events/side effects   Patient's therapy is appropriate to: Continue    Goals Addressed             This Visit's Progress    Comply with lab assessments   On track    Patient is on track. Patient will adhere to provider and/or lab appointments.  Last viral load was undetectable as of 11/12/23.      Maintain optimal adherence to therapy   Worsening    Patient is reporting compliance; however her refill history indicates otherwise. Patient will work on increased adherence.         Follow up: 12 months  Silvano [name] Specialty Pharmacist

## 2024-03-24 NOTE — Progress Notes (Signed)
 Specialty Pharmacy Refill Coordination Note  Brenda Tyler is a 38 y.o. female contacted today regarding refills of specialty medication(s) Darun-Cobic-Emtricit-TenofAF (Symtuza )   Patient requested [name] at [facility] at [place] date: 03/30/24   Medication will be filled on 03/30/24.

## 2024-03-30 ENCOUNTER — Other Ambulatory Visit: Payer: Self-pay

## 2024-04-04 ENCOUNTER — Other Ambulatory Visit (HOSPITAL_COMMUNITY): Payer: Self-pay

## 2024-04-06 ENCOUNTER — Encounter: Attending: Surgery | Admitting: Dietician

## 2024-04-06 ENCOUNTER — Encounter: Payer: Self-pay | Admitting: Dietician

## 2024-04-06 ENCOUNTER — Encounter (HOSPITAL_COMMUNITY): Payer: Self-pay | Admitting: Surgery

## 2024-04-06 VITALS — Ht 64.5 in | Wt 311.9 lb

## 2024-04-06 DIAGNOSIS — E669 Obesity, unspecified: Secondary | ICD-10-CM | POA: Insufficient documentation

## 2024-04-06 NOTE — Progress Notes (Signed)
 Supervised Weight Loss Visit Bariatric Nutrition Education Appt Start Time: 1104    End Time: 1125  Planned surgery: Gastric By-Pass Pt expectation of surgery: to get to at least 200 lbs and main goal to be healthy and have more energy  Referral stated Supervised Weight Loss (SWL) visits needed: 3 months  1 out of 3 SWL Appointments   NUTRITION ASSESSMENT   Anthropometrics  Start weight at [facility]: 312.2 lbs (date: 03/23/2024)  Height: 64.5 in BMI: 52.76 kg/m2     Clinical  Medical hx: GERD, anxiety, HIV, obesity Medications: Vit D, symtuza   Labs: cholesterol 246, LDL 159 Notable signs/symptoms: non noted Any previous deficiencies? No  Lifestyle & Dietary Hx Pt states she had breakfast and will get lunch dinner today, stating she has been eating 3 meals per day. Pt remembered the protein goal of 60 grams per day.  Estimated daily fluid intake: ? oz Supplements: vit D Current average weekly physical activity: ADL  24-Hr Dietary Recall First Meal: skip or buiscuit, grits, [place] bacon, eggs, fruit or cereal Snack:  Second Meal: left overs or fast food ([employer], [employer], or chicken) Snack: pretzels or fruit Third Meal: skip or snack on something Snack: jello or fruit or peanut butter butter crackers or small salad Beverages: juice, water, sweet tea, vitamin water, Gatorade (sometimes Gatorade zero)  Alcoholic beverages per week: 0   Estimated Energy Needs Calories: 1500   NUTRITION DIAGNOSIS  Overweight/obesity (-3.3) related to past poor dietary habits and physical inactivity as evidenced by patient w/ planned gastric by-pass surgery following dietary guidelines for continued weight loss.   NUTRITION INTERVENTION  Nutrition counseling (C-1) and education (E-2) to facilitate bariatric surgery goals.  Pre-Op Goals Reviewed with the Patient  The Bariatric MyPlate is a simplified visual guide that helps patients plan and prep meals after bariatric surgery by  emphasizing portion control and nutrient balance. It uses a small 6-inch plate divided into sections: half for lean protein (like chicken, fish, or eggs), a third for non-starchy vegetables (such as spinach or broccoli), and a small portion for fruit or whole grains if tolerated. This approach supports satiety, protein goals, and vitamin intake while preventing overeating. By using this plate model during meal prep, patients can easily build balanced meals, reinforce healthy habits, and stay on track with their post-op nutrition goals.  Practicing not drinking with meals before bariatric surgery is essential because it helps patients build a habit that supports long-term success after surgery. Drinking while eating can cause food to move through the stomach pouch too quickly, reducing satiety and potentially leading to overeating or discomfort. It can also interfere with nutrient absorption and stretch the pouch over time. By learning to separate fluids from meals--stopping 15 minutes before and waiting 30 minutes after--patients can better prepare for post-op guidelines.  Pre-Op Goals Progress & New Goals Continue: Cut out grazing between meals or at night; schedule meals and snacks.  Continue: Eat every 3-5 hours Continue: Decrease high sugar foods/decrease high fat/fried foods New: meal plan and prep (use the Bariatric MyPlate as a guide); pack you lunch for the workday; aim for a protein with every meal or snack New: practice not drinking with meals or snacks  Handouts Provided Include  Bariatric MyPlate Goals printed  Learning Style & Readiness for Change Teaching method utilized: Visual & Auditory  Demonstrated degree of understanding via: Teach Back  Readiness Level: preparation Barriers to learning/adherence to lifestyle change: nothing identified at this time  RD's Notes for  next Visit  Patient progress toward chosen goals.  MONITORING & EVALUATION Dietary intake, weekly physical  activity, body weight, and pre-op goals.  Next Steps  Patient is to return to [facility] in 1 month for next SWL visit.

## 2024-04-06 NOTE — Progress Notes (Signed)
 Attempted to obtain medical history via telephone, unable to reach at this time. Unable to leave voicemail to return pre surgical testing department's phone call,due to mailbox not set up.

## 2024-04-08 ENCOUNTER — Other Ambulatory Visit: Payer: Self-pay

## 2024-04-08 ENCOUNTER — Ambulatory Visit

## 2024-04-11 NOTE — Anesthesia Preprocedure Evaluation (Signed)
 Anesthesia Evaluation  Patient identified by MRN, date of birth, ID band Patient awake    Reviewed: Allergy & Precautions, NPO status , Patient's Chart, lab work & pertinent test results  History of Anesthesia Complications Negative for: history of anesthetic complications  Airway Mallampati: II  TM Distance: >3 FB Neck ROM: Full    Dental no notable dental hx. (+) Teeth Intact, Dental Advisory Given   Pulmonary    Pulmonary exam normal breath sounds clear to auscultation       Cardiovascular (-) hypertension(-) CAD and (-) Past MI Normal cardiovascular exam Rhythm:Regular Rate:Normal     Neuro/Psych  Headaches PSYCHIATRIC DISORDERS Anxiety Depression       GI/Hepatic ,GERD  ,,  Endo/Other    Class 3 obesity  Renal/GU      Musculoskeletal   Abdominal  (+) + obese  Peds  Hematology   Anesthesia Other Findings   Reproductive/Obstetrics                              Anesthesia Physical Anesthesia Plan  ASA: 3  Anesthesia Plan: MAC   Post-op Pain Management: Sufentanil infusion   Induction: Intravenous  PONV Risk Score and Plan: Treatment may vary due to age or medical condition and Propofol  infusion  Airway Management Planned: Natural Airway and Nasal Cannula  Additional Equipment: None  Intra-op Plan:   Post-operative Plan:   Informed Consent: I have reviewed the patients History and Physical, chart, labs and discussed the procedure including the risks, benefits and alternatives for the proposed anesthesia with the patient or authorized representative who has indicated his/her understanding and acceptance.     Dental advisory given  Plan Discussed with: CRNA and Surgeon  Anesthesia Plan Comments:          Anesthesia Quick Evaluation

## 2024-04-13 ENCOUNTER — Other Ambulatory Visit: Payer: Self-pay

## 2024-04-13 ENCOUNTER — Encounter (HOSPITAL_COMMUNITY)
Admission: RE | Disposition: A | Discharge status: Home / Self Care | Payer: Self-pay | Source: Home / Self Care | Attending: Surgery

## 2024-04-13 ENCOUNTER — Ambulatory Visit (HOSPITAL_BASED_OUTPATIENT_CLINIC_OR_DEPARTMENT_OTHER): Payer: Self-pay | Admitting: Anesthesiology

## 2024-04-13 ENCOUNTER — Ambulatory Visit (HOSPITAL_COMMUNITY): Payer: Self-pay | Admitting: Anesthesiology

## 2024-04-13 ENCOUNTER — Encounter (HOSPITAL_COMMUNITY): Payer: Self-pay | Admitting: Surgery

## 2024-04-13 ENCOUNTER — Ambulatory Visit (HOSPITAL_COMMUNITY)
Admission: RE | Admit: 2024-04-13 | Discharge: 2024-04-13 | Disposition: A | Discharge status: Home / Self Care | Attending: Surgery | Admitting: Surgery

## 2024-04-13 DIAGNOSIS — Z6841 Body Mass Index (BMI) 40.0 and over, adult: Secondary | ICD-10-CM | POA: Insufficient documentation

## 2024-04-13 DIAGNOSIS — K2970 Gastritis, unspecified, without bleeding: Secondary | ICD-10-CM | POA: Diagnosis not present

## 2024-04-13 DIAGNOSIS — F418 Other specified anxiety disorders: Secondary | ICD-10-CM

## 2024-04-13 DIAGNOSIS — K219 Gastro-esophageal reflux disease without esophagitis: Secondary | ICD-10-CM | POA: Insufficient documentation

## 2024-04-13 DIAGNOSIS — E66813 Obesity, class 3: Secondary | ICD-10-CM

## 2024-04-13 HISTORY — PX: ESOPHAGOGASTRODUODENOSCOPY: SHX5428

## 2024-04-13 SURGERY — EGD (ESOPHAGOGASTRODUODENOSCOPY)
Anesthesia: Monitor Anesthesia Care

## 2024-04-13 MED ORDER — PROPOFOL 500 MG/50ML IV EMUL
INTRAVENOUS | Status: AC
Start: 2024-04-13 — End: 2024-04-13
  Filled 2024-04-13: qty 50

## 2024-04-13 MED ORDER — PROPOFOL 500 MG/50ML IV EMUL
INTRAVENOUS | Status: AC
  Filled 2024-04-13: qty 200

## 2024-04-13 MED ORDER — PROPOFOL 500 MG/50ML IV EMUL
INTRAVENOUS | Status: DC | PRN
  Administered 2024-04-13: 125 ug/kg/min via INTRAVENOUS

## 2024-04-13 MED ORDER — SODIUM CHLORIDE 0.9 % IV SOLN
INTRAVENOUS | Status: DC | PRN
Start: 2024-04-13 — End: 2024-04-13

## 2024-04-13 MED ORDER — PROPOFOL 10 MG/ML IV BOLUS
INTRAVENOUS | Status: DC | PRN
  Administered 2024-04-13: 200 mg via INTRAVENOUS

## 2024-04-13 MED ORDER — LIDOCAINE 2% (20 MG/ML) 5 ML SYRINGE
INTRAMUSCULAR | Status: DC | PRN
  Administered 2024-04-13: 100 mg via INTRAVENOUS

## 2024-04-13 MED ORDER — PROPOFOL 1000 MG/100ML IV EMUL
INTRAVENOUS | Status: AC
  Filled 2024-04-13: qty 100

## 2024-04-13 NOTE — Transfer of Care (Signed)
 Immediate Anesthesia Transfer of Care Note  Patient: Brenda Tyler  Procedure(s) Performed: EGD (ESOPHAGOGASTRODUODENOSCOPY)  Patient Location: Endoscopy Unit  Anesthesia Type:MAC  Level of Consciousness: drowsy and patient cooperative  Airway & Oxygen Therapy: Patient Spontanous Breathing  Post-op Assessment: Report given to RN and Post -op Vital signs reviewed and stable  Post vital signs: Reviewed and stable  Last Vitals:  Vitals Value Taken Time  BP 129/69 04/13/24 07:51  Temp    Pulse 84 04/13/24 07:52  Resp 19 04/13/24 07:52  SpO2 96 % 04/13/24 07:52  Vitals shown include unfiled device data.  Last Pain:  Vitals:   04/13/24 0708  TempSrc: Temporal  PainSc: 0-No pain         Complications: No notable events documented.

## 2024-04-13 NOTE — Discharge Instructions (Signed)
 Brenda Tyler will call you to discuss next steps.

## 2024-04-13 NOTE — Anesthesia Postprocedure Evaluation (Signed)
 Anesthesia Post Note  Patient: Brenda Tyler  Procedure(s) Performed: EGD (ESOPHAGOGASTRODUODENOSCOPY)     Patient location during evaluation: PACU Anesthesia Type: MAC Level of consciousness: awake and alert Pain management: pain level controlled Vital Signs Assessment: post-procedure vital signs reviewed and stable Respiratory status: spontaneous breathing, nonlabored ventilation, respiratory function stable and patient connected to nasal cannula oxygen Cardiovascular status: stable and blood pressure returned to baseline Postop Assessment: no apparent nausea or vomiting Anesthetic complications: no   No notable events documented.  Last Vitals:  Vitals:   04/13/24 0800 04/13/24 0810  BP: 125/70 131/69  Pulse: 79 68  Resp: (!) 22 20  Temp:    SpO2: 100% 100%    Last Pain:  Vitals:   04/13/24 0810  TempSrc:   PainSc: 7                  Garnette [name]

## 2024-04-13 NOTE — H&P (Signed)
 Admitting Physician: Deward PARAS Terren Jandreau  Service: Bariatric Surgery  CC: Obesity  Subjective   HPI: Brenda Tyler is an 38 y.o. female who is here for EGD prior to gastric bypass.  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 - s/p Colpo 11/02/13 - CIN II -->    HIV (human immunodeficiency virus infection) (HCC)    HSV (herpes simplex virus) infection    Obesity    Oropharyngeal candidiasis 07/05/2014   Other fatigue    Shortness of breath on exertion     Past Surgical History:  Procedure Laterality Date   ANKLE SURGERY Right    COLPOSCOPY N/A 10/29/2023   Procedure: COLPOSCOPY;  Surgeon: Cleatus Moccasin, MD;  Location: [facility] OR;  Service: Gynecology;  Laterality: N/A;   HYSTEROSCOPY WITH D & C N/A 10/29/2023   Procedure: DILATATION AND CURETTAGE /HYSTEROSCOPY;  Surgeon: Cleatus Moccasin, MD;  Location: [facility] OR;  Service: Gynecology;  Laterality: N/A;   INCISION AND DRAINAGE ABSCESS     on abdomen and buttocks    Family History  Problem Relation Age of Onset   Heart disease Mother    Sudden death Mother    Stroke Mother    Hypertension Father    Hypertension Maternal Grandmother    Diabetes Maternal Grandmother    Heart disease Maternal Grandmother    Cancer Maternal Grandmother    Heart failure Paternal Grandmother    Hypertension Paternal Grandmother    Diabetes Paternal Grandmother    Heart disease Paternal Grandmother    Prostate cancer Paternal Grandfather        older than 51 yo   Learning disabilities Son    Learning disabilities Son    Sickle cell trait Son    Autism spectrum disorder Other     Social:  reports that she has never smoked. She has never used smokeless tobacco. She reports that she does not currently use alcohol after a past usage of about 2.0 standard drinks of alcohol  per week. She reports that she does not currently use drugs.  Allergies:  Allergies  Allergen Reactions   Dilaudid  [Hydromorphone  Hcl] Other (See Comments)    Makes her dizzy    Medications: Current Outpatient Medications  Medication Instructions   buPROPion  ER (WELLBUTRIN  SR) 100 mg, Oral, Daily   Darunavir -Cobicistat-Emtricitabine-Tenofovir Alafenamide (SYMTUZA ) 800-150-200-10 MG TABS 1 tablet, Oral, Daily with breakfast   hydrOXYzine (ATARAX) 25 mg, Daily at bedtime   ibuprofen  (ADVIL ) 800 mg, Oral, 3 times daily with meals PRN   medroxyPROGESTERone  (PROVERA ) 10 mg, Oral, Daily   omeprazole  (PRILOSEC) 40 mg, Oral, Daily   promethazine -dextromethorphan (PROMETHAZINE -DM) 6.25-15 MG/5ML syrup 5 mLs, Oral, 4 times daily PRN    ROS - all of the below systems have been reviewed with the patient and positives are indicated with bold text General: chills, fever or night sweats Eyes: blurry vision or double vision ENT: epistaxis or sore throat Allergy/Immunology: itchy/watery eyes or nasal congestion Hematologic/Lymphatic: bleeding problems, blood clots or swollen lymph nodes Endocrine: temperature intolerance or unexpected weight changes Breast: new or changing breast lumps or nipple discharge Resp: cough, shortness of breath, or wheezing CV: chest pain or dyspnea on exertion GI: as per HPI GU: dysuria, trouble voiding, or hematuria MSK: joint pain or joint stiffness Neuro: TIA  or stroke symptoms Derm: pruritus and skin lesion changes Psych: anxiety and depression  Objective   PE Blood pressure (!) 142/85, pulse 67, temperature (!) 97.5 F (36.4 C), temperature source Temporal, resp. rate 14, height 5' 5 (1.651 m), weight (!) 142.9 kg, SpO2 100%. Constitutional: NAD; conversant; no deformities Eyes: Moist conjunctiva; no lid lag; anicteric; PERRL Neck: Trachea midline; no thyromegaly Lungs: Normal respiratory effort; no tactile fremitus CV: RRR; no palpable thrills; no  pitting edema GI: Abd Soft, nontender; no palpable hepatosplenomegaly MSK: Normal range of motion of extremities; no clubbing/cyanosis Psychiatric: Appropriate affect; alert and oriented x3 Lymphatic: No palpable cervical or axillary lymphadenopathy  No results found for this or any previous visit (from the past 24 hours).  Imaging Orders  No imaging studies ordered today     Assessment and Plan   Brenda Tyler is an 38 y.o. female with obesity, here for EGD prior to gastric bypass.  I explained my rational for performing upper endoscopy in my bariatric patients. During the procedure I will biopsy for H. Pylori, evaluate for hiatal hernia, evaluate for reflux esophagitis and look for any other abnormalities that may influence the procedure. We discussed the risks, benefits and alternative to this procedure and the patient granted consent to proceed.   Deward JINNY Foy, MD  [facility] Use [email or web address] to contact on call provider

## 2024-04-14 LAB — SURGICAL PATHOLOGY

## 2024-04-14 NOTE — Op Note (Signed)
 [facility] Patient Name: Brenda Tyler Procedure Date: 04/13/2024 MRN: [id number] Attending MD: Deward JINNY Foy , ,  Date of Birth: 1986-04-27 CSN: [id number] Age: 38 Admit Type: Outpatient Procedure:                Upper GI endoscopy Indications:               Providers:                Deward DOROTHA Foy, Ozell Trudy Corene Jesse,                            Technician Referring MD:              Medicines:                Monitored Anesthesia Care Complications:            No immediate complications. Estimated Blood Loss:     Estimated blood loss was minimal. Procedure:                Pre-Anesthesia Assessment:                           - Prior to the procedure, a History and Physical                            was performed, and patient medications and                            allergies were reviewed. The patient is competent.                            The risks and benefits of the procedure and the                            sedation options and risks were discussed with the                            patient. All questions were answered and informed                            consent was obtained. Patient identification and                            proposed procedure were verified by the physician                            in the pre-procedure area. Mental Status                            Examination: alert and oriented. Airway                            Examination: normal oropharyngeal airway and neck                            mobility. Respiratory Examination: clear to  auscultation. CV Examination: normal. ASA Grade                            Assessment: II - A patient with mild systemic                            disease. After reviewing the risks and benefits,                            the patient was deemed in satisfactory condition to                            undergo the procedure. The anesthesia plan was to                             use monitored anesthesia care (MAC). Immediately                            prior to administration of medications, the patient                            was re-assessed for adequacy to receive sedatives.                            The heart rate, respiratory rate, oxygen                            saturations, blood pressure, adequacy of pulmonary                            ventilation, and response to care were monitored                            throughout the procedure. The physical status of                            the patient was re-assessed after the procedure.                           After obtaining informed consent, the endoscope was                            passed under direct vision. Throughout the                            procedure, the patient's blood pressure, pulse, and                            oxygen saturations were monitored continuously. The                            [id number]-[id number] ([id number] Olympus endoscope was introduced  through the mouth, and advanced to the second part                            of duodenum. The upper GI endoscopy was                            accomplished without difficulty. The patient                            tolerated the procedure well. Scope In: Scope Out: Findings:      The examined esophagus was normal.      The gastroesophageal flap valve was visualized endoscopically and       classified as Hill Grade I (prominent fold, tight to endoscope).      Diffuse mild inflammation was found in the prepyloric region of the       stomach. Biopsies were taken with a cold forceps for histology.       Verification of patient identification for the specimen was done.       Estimated blood loss was minimal.      The examined duodenum was normal. Impression:               - Normal esophagus.                           - Gastroesophageal flap valve classified as Hill                             Grade I (prominent fold, tight to endoscope).                           - Gastritis. Biopsied.                           - Normal examined duodenum. Moderate Sedation:      Moderate (conscious) sedation was personally administered by an       anesthesia professional. The following parameters were monitored: oxygen       saturation, heart rate, blood pressure, and response to care. Total       physician intraservice time was 15 minutes. Recommendation:           - Discharge patient to home.                           - Resume previous diet.                           - Continue present medications.                           - Await pathology results. Procedure Code(s):        --- Professional ---                           [phone], Esophagogastroduodenoscopy, flexible,                            transoral; with biopsy, single or multiple  Diagnosis Code(s):        --- Professional ---                           K29.70, Gastritis, unspecified, without bleeding CPT copyright 2022 American Medical Association. All rights reserved. The codes documented in this report are preliminary and upon coder review may  be revised to meet current compliance requirements. Deward PARAS [place],  04/14/2024 5:00:33 PM This report has been signed electronically. Number of Addenda: 0

## 2024-04-15 ENCOUNTER — Encounter (HOSPITAL_COMMUNITY): Payer: Self-pay | Admitting: Surgery

## 2024-04-16 ENCOUNTER — Other Ambulatory Visit: Payer: Self-pay

## 2024-04-17 ENCOUNTER — Other Ambulatory Visit (HOSPITAL_COMMUNITY): Payer: Self-pay

## 2024-04-20 ENCOUNTER — Ambulatory Visit: Admitting: Family

## 2024-04-20 ENCOUNTER — Other Ambulatory Visit: Payer: Self-pay

## 2024-04-20 NOTE — Progress Notes (Signed)
 Specialty Pharmacy Refill Coordination Note  Brenda Tyler is a 38 y.o. female contacted today regarding refills of specialty medication(s) Darun-Cobic-Emtricit-TenofAF (Symtuza )   Patient requested [name] at [facility] at [place] date: 04/20/24   Medication will be filled on: 04/16/24

## 2024-04-27 ENCOUNTER — Ambulatory Visit (HOSPITAL_COMMUNITY): Admitting: Licensed Clinical Social Worker

## 2024-04-27 DIAGNOSIS — F4320 Adjustment disorder, unspecified: Secondary | ICD-10-CM

## 2024-04-27 NOTE — Progress Notes (Unsigned)
 Virtual Visit via Video Note  I connected with Brenda Tyler on 04/27/24 at  5:00 PM EST by a video enabled telemedicine application and verified that I am speaking with the correct person using two identifiers.  Location: Patient: Patient primary residence, [place] Provider: Clinician Virtual office,    I discussed the limitations of evaluation and management by telemedicine and the availability of in person appointments. The patient expressed understanding and agreed to proceed.   I discussed the assessment and treatment plan with the patient. The patient was provided an opportunity to ask questions and all were answered. The patient agreed with the plan and demonstrated an understanding of the instructions.   The patient was advised to call back or seek an in-person evaluation if the symptoms worsen or if the condition fails to improve as anticipated. Comprehensive Clinical Assessment (CCA) Note   04/27/2024 Brenda Tyler [id number]   Brenda Tyler is a 38 y.o. year old adult patient reporting to [facility] for preliminary screening to determine bariatric surgery eligibility. Patient reports that they have tried several weight loss interventions in the past, including weight watchers and medical weight loss.  Brenda Tyler reports current medical concerns/medical history of chronic pain, HIV.  Patient reports mild history of depression, anxiety. Pt reports that she has buproprion and hydroxyzine for managing symptoms but has discontinued taking it. Pt reports that she was referred to a counselor earlier in the year but did not follow through with scheduling.  Brenda Tyler denies SI, HI, or perceptual disturbances at time of assessment. Patient denies substance use issues at time of assessment. Brenda Tyler  reports that they are motivated to make positive changes to contribute to improved wellness and are seeking bariatric weight loss surgery as an intervention to support wellness  goals.   Chief Complaint:  Chief Complaint  Patient presents with   BARIATRIC SCREENING   Visit Diagnosis:  No diagnosis found.  Disposition:  Clinician sees no significant psychological factors that would hinder the success of bariatric surgery at time of assessment. Clinician supports patient candidacy for Bariatric Surgery.   Patient reports realistic expectations post surgery, is aware of the pre and post surgical process, client reports that behavioral health diagnosis(es) are stable at time of assessment, client reports positive pre and post surgical support system, and client reports motivation to make positive change.     CCA Biopsychosocial Intake/Chief Complaint:  BARIATRIC SCREENING  Current Symptoms/Problems: Brenda Tyler is a 38 y.o. year old adult patient reporting to [facility] for preliminary screening to determine bariatric surgery eligibility. Patient reports that they have tried several weight loss interventions in the past, including weight watchers and medical weight loss.  Brenda Tyler reports current medical concerns/medical history of chronic pain, HIV.  Patient reports mild history of depression, anxiety. Pt reports that she has buproprion and hydroxyzine for managing symptoms but has discontinued taking it. Pt reports that she was referred to a counselor earlier in the year but did not follow through with scheduling.  Brenda Tyler denies SI, HI, or perceptual disturbances at time of assessment. Patient denies substance use issues at time of assessment. Brenda Tyler  reports that they are motivated to make positive changes to contribute to improved wellness and are seeking bariatric weight loss surgery as an intervention to support wellness goals.   Patient Reported Schizophrenia/Schizoaffective Diagnosis in Past: No   Strengths: Pt reports that she has strong support system, pt feels  good about self  Preferences: Due to unsuccessful weight loss  interventions in the past, pt is seeking bariatric weight loss surgery  Abilities: Pt has ability to make positive changes, pt has ability to work full time, pt has ability to be physically active   Type of Services Patient Feels are Needed: Bariatric weight loss surgery   Initial Clinical Notes/Concerns: pt has been given psychotropic medication and has been referred for psychotherapy in the past--pt doesn't feel that its needed currently and is not experiencing many depression or anxiety symptoms   Mental Health Symptoms Depression:  Fatigue   Duration of Depressive symptoms: Greater than two weeks   Mania:  None   Anxiety:   Fatigue   Psychosis:  None   Duration of Psychotic symptoms: No data recorded  Trauma:  None   Obsessions:  None   Compulsions:  None   Inattention:  Symptoms before age 41; Symptoms present in 2 or more settings   Hyperactivity/Impulsivity:  Symptoms present before age 73; Several symptoms present in 2 of more settings   Oppositional/Defiant Behaviors:  None   Emotional Irregularity:  None   Other Mood/Personality Symptoms:  pt denies    Mental Status Exam Appearance and self-care  Stature:  Average   Weight:  Obese   Clothing:  Casual   Grooming:  Well-groomed   Cosmetic use:  Age appropriate   Posture/gait:  Normal   Motor activity:  Not Remarkable   Sensorium  Attention:  Normal   Concentration:  Normal   Orientation:  X5   Recall/memory:  Normal   Affect and Mood  Affect:  Appropriate   Mood:  Other (Comment) (WNL)   Relating  Eye contact:  Normal   Facial expression:  Responsive   Attitude toward examiner:  Cooperative   Thought and Language  Speech flow: Clear and Coherent   Thought content:  Appropriate to Mood and Circumstances   Preoccupation:  None   Hallucinations:  None   Organization:  coherent, goal directed  [employer] of Knowledge:  Good   Intelligence:  Average    Abstraction:  Normal   Judgement:  Good   Reality Testing:  Realistic   Insight:  Good   Decision Making:  Normal   Social Functioning  Social Maturity:  Responsible   Social Judgement:  Normal   Stress  Stressors:  Illness (worry about health at [facility] about the surgery)   Coping Ability:  Normal   Skill Deficits:  No data recorded  Supports:  Friends/Service system; Family (sister, husband,)     Religion: Religion/Spirituality Are You A Religious Person?: No How Might This Affect Treatment?: No religious barriers to treatment  Leisure/Recreation: Leisure / Recreation Do You Have Hobbies?: Yes Leisure and Hobbies: bowlings, travel, go outside--walking in the park, reading, going out to eat, work  Exercise/Diet: Exercise/Diet Do You Exercise?: Yes What Type of Exercise Do You Do?: Hiking, Run/Walk How Many Times a Week Do You Exercise?: 4-5 times a week Have You Gained or Lost A Significant Amount of Weight in the Past Six Months?: No Do You Follow a Special Diet?: Yes Type of Diet: cutting back with things, drinking more water, cutting back on snacks Do You Have Any Trouble Sleeping?: No   CCA Employment/Education Employment/Work Situation: Employment / Work Situation Employment Situation: Employed Where is Patient Currently Employed?: Pt reports that she works in [employer] full time jobs How Long has Patient Been Employed?: ongoing Are You Satisfied With Your  Job?: Yes Do You Work More Than One Job?: Yes Work Stressors: The amount of work is stressful--but pt reports that she is happy with her roles Patient's Job has Been Impacted by Current Illness: No Has Patient ever Been in the [employer]?: No  Education: Education Is Patient Currently Attending School?: No Last Grade Completed: 12 Name of High School: Graduated high school Did Garment/textile Technologist From [employer]?: Yes Did You Attend College?: No Did You Attend Graduate School?: No Did  You Have An Individualized Education Program (IIEP): No Did You Have Any Difficulty At School?: Yes Were Any Medications Ever Prescribed For These Difficulties?: Yes Medications Prescribed For School Difficulties?: unknown--took medication in elementary school (pt thinks it was Ritalin) Patient's Education Has Been Impacted by Current Illness: No   CCA Family/Childhood History Family and Relationship History: Family history Marital status: Married Number of Years Married: 1 What types of issues is patient dealing with in the relationship?: none--husband good support system What is your sexual orientation?: heterosexual Does patient have children?: Yes How many children?: 3 How is patient's relationship with their children?: 3 sons  Childhood History:  Childhood History By whom was/is the patient raised?: Both parents Additional childhood history information: Pt reports that parents were not together--pt would spend time with both throughout childhood Description of patient's relationship with caregiver when they were a child: stable relationship w/ parents as a child Patient's description of current relationship with people who raised him/her: mother deceased 12. pt has relationship w/ father. How were you disciplined when you got in trouble as a child/adolescent?: fair Does patient have siblings?: Yes Number of Siblings: 2 Description of patient's current relationship with siblings: brother--estranged, sister--estranged Did patient suffer any verbal/emotional/physical/sexual abuse as a child?: No Did patient suffer from severe childhood neglect?: No Has patient ever been sexually abused/assaulted/raped as an adolescent or adult?: No Was the patient ever a victim of a crime or a disaster?: No Witnessed domestic violence?: No Has patient been affected by domestic violence as an adult?: No  Child/Adolescent Assessment:     CCA Substance Use Alcohol/Drug Use: Alcohol / Drug  Use Pain Medications: SEE MAR Prescriptions: SEE MAR Over the Counter: SEE MAR History of alcohol / drug use?: No history of alcohol / drug abuse Longest period of sobriety (when/how long): ongoing Negative Consequences of Use:  (none) Withdrawal Symptoms: None       ASAM's:  Six Dimensions of Multidimensional Assessment  Dimension 1:  Acute Intoxication and/or Withdrawal Potential:   Dimension 1:  Description of individual's past and current experiences of substance use and withdrawal: Pt reports rare etoh and recently stopped using THC  Dimension 2:  Biomedical Conditions and Complications:   Dimension 2:  Description of patient's biomedical conditions and  complications: none  Dimension 3:  Emotional, Behavioral, or Cognitive Conditions and Complications:  Dimension 3:  Description of emotional, behavioral, or cognitive conditions and complications: none  Dimension 4:  Readiness to Change:  Dimension 4:  Description of Readiness to Change criteria: none  Dimension 5:  Relapse, Continued use, or Continued Problem Potential:  Dimension 5:  Relapse, continued use, or continued problem potential critiera description: none  Dimension 6:  Recovery/Living Environment:  Dimension 6:  Recovery/Iiving environment criteria description: none  ASAM Severity Score: ASAM's Severity Rating Score: 0  ASAM Recommended Level of Treatment: ASAM Recommended Level of Treatment: Level I Outpatient Treatment   Substance use Disorder (SUD) Substance Use Disorder (SUD)  Checklist Symptoms of Substance Use:  (  none)  Recommendations for Services/Supports/Treatments: Recommendations for Services/Supports/Treatments Recommendations For Services/Supports/Treatments: Individual Therapy (psychotherapy and/or psychiatric medication PRN)  DSM5 Diagnoses: Patient Active Problem List   Diagnosis Date Noted   Upper abdominal pain 11/12/2023   Anhedonia 11/12/2023   Postmenopausal bleeding 10/29/2023   Abnormal  Pap smear of cervix 10/29/2023   High risk human papilloma cervical smear 10/29/2023   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   Breast pain 01/31/2023   Healthcare maintenance 01/31/2023   Pre-diabetes 11/02/2020   Vitamin D  deficiency 11/02/2020   At risk for diabetes mellitus 11/02/2020   Acute intractable headache 02/23/2020   Dizziness and giddiness 02/23/2020   Cyst of bone of left hand 12/10/2019   Dislocation of knee, anterior, left, closed 01/21/2016   Closed anterior dislocation of left knee 01/21/2016   Depression 07/26/2014   Herpes labialis 07/03/2014   HIV disease (HCC) 04/23/2014   Severe obesity (HCC) 12/22/2012    Patient Centered Plan: Patient is on the following Treatment Plan(s):   Behavioral Health Assessment  Patient Name Brenda Tyler Date of Birth:  10/25/1985 Age:  38 y.o. Date of Interview:  04/27/24 Gender:  F   Date of Report : 04/27/24 Purpose:   Bariatric/Weight-loss Surgery (pre-operative evaluation)    Assessment Instruments:  DSM-5-TR Self-Rated Level 1 Cross-Cutting Symptom Measure--Adult Severity Measure for Generalized Anxiety Disorder--Adult EAT-26 (Eating Attitudes Test) SSS-8 (Somatic Symptom Scale) BES (Binge Eating Scale)  Chief Complaint: BARIATRIC SCREENING  Client Background: Patient is a *** seeking weight loss surgery. Patient has *** education and is currently working as ***.  Patients marital status is ***.   The patient is *** feet  *** inches tall and *** lbs., reflecting a BMI of *** classifying patient in the obese range and at further risk of co-morbid diseases.  Tobacco Use: Patient denies tobacco use.   PATIENT BEHAVIORAL ASSESSMENT SCORES  Personal History of Mental Illness: Patient denies treatment for depression and anxiety.   Mental Status Examination: Patient was oriented x5 (person, place, situation, time, and object). Patient was appropriately groomed, and neatly dressed. Patient was alert, engaged,  pleasant, and cooperative. Patient denies suicidal and homicidal ideations or any perceptual disturbances. Patient denies self-injury.   DSM-5-TR Self-Rated Level 1 Cross-Cutting Symptom Measure--Adult: Patient completed 23-item questionnaire assessing symptoms related to depression, anger, mania, anxiety, somatic symptoms, suicidal ideation, psychosis, insomnia, memory concerns, repetitive behaviors, dissociation, personality functioning and substance use. Brenda Tyler scored *** in *** domain.   Severity Measure for Generalized Anxiety Disorder--Adult: Patient completed a 10-item  scale. Total scores can range from 0 to 40. A raw score is calculated by summing the answer to each question, and an average total score is achieved by dividing the raw score by the number of items (e.g., 10).Brenda Tyler had a total raw score of *** out of 40 which was divided by the total number of questions answered (10) to get an average score of *** which indicates no significant anxiety.   EAT-26: The EAT-26 is a twenty-six-question screening tool to identify symptoms of dieting behaviors, bulimia, food preoccupation and oral control.  Brenda Tyler scored *** out of 26. Scores below a 20 are considered not meeting criteria for disordered eating. Patient denies inducing vomiting, or intentional meal skipping. Patient denies binge eating behaviors. Patient denies laxative abuse. Patient does not meet criteria for a DSM-V eating disorder.  SSS-8: The SSS-8, or Somatic Symptom Scale-8, is a brief self-report questionnaire used to assess the perceived burden  of common somatic (physical) symptoms.  (SSS-8) is scored by summing the responses to eight items, each rated on a 5-point Likert scale from 0 (Not at all) to 4 (Very much). Total scores range from 0 to 32, with higher scores indicating greater somatic symptom burden. Scores are categorized into five severity levels: no/minimal, low, medium, high, and very high  somatic symptom burden. Brenda Tyler scored *** out of 32, which indicates *** score.   BES: The Binge Eating Scale (BES) is a self-report questionnaire developed to assess the presence and severity of binge eating behavior, particularly in individuals who may be struggling with obesity or disordered eating patterns. Scoring: 0-7: Minimal binge eating 8-15: Mild binge eating 16-23: Moderate binge eating 24-31: Severe binge eating 32-40: Extreme binge eating.  Brenda Tyler scored *** out of 40, which indicates *** score.   Conclusion & Recommendations:   Health history and current assessment reflect that patient is suitable to be a candidate for bariatric surgery. Patient understands the procedure, the risks associated with it, and the importance of post-operative holistic care (Physical, Spiritual/Values, Relationships, and Mental/Emotional health) with access to resources for support as needed. The patient has made an informed decision to proceed with procedure. The patient is motivated and expressed understanding of the post-surgical requirements. Patient's psychological assessment will be valid from today's date for 6 months (04/27/24+34m). After that date, a follow-up appointment will be needed to re-evaluate the patient's psychological status.   Clinician sees no significant psychological factors that would hinder the success of bariatric surgery at time of assessment. Clinician supports patient candidacy for Bariatric Surgery.   Tawni JONELLE Brisker, MSW, LCSW Licensed Clinical Social [facility]     Referrals to Alternative Service(s): Referred to Alternative Service(s):   Place:   Date:   Time:    Referred to Alternative Service(s):   Place:   Date:   Time:    Referred to Alternative Service(s):   Place:   Date:   Time:    Referred to Alternative Service(s):   Place:   Date:   Time:      Collaboration of Care: Other Pt encouraged to follow  recommendations of bariatric team members and to consult with psychiatric providers of record PRN ([facility])  Patient/Guardian was advised Release of Information must be obtained prior to any record release in order to collaborate their care with an outside provider. Patient/Guardian was advised if they have not already done so to contact the registration department to sign all necessary forms in order for us  to release information regarding their care.   Consent: Patient/Guardian gives verbal consent for treatment and assignment of benefits for services provided during this visit. Patient/Guardian expressed understanding and agreed to proceed.   Eufemio Strahm R Laniqua Torrens, LCSW

## 2024-05-07 ENCOUNTER — Encounter: Payer: Self-pay | Admitting: Dietician

## 2024-05-07 ENCOUNTER — Encounter: Attending: Surgery | Admitting: Dietician

## 2024-05-07 VITALS — Ht 64.5 in | Wt 315.9 lb

## 2024-05-07 DIAGNOSIS — E669 Obesity, unspecified: Secondary | ICD-10-CM | POA: Diagnosis not present

## 2024-05-07 NOTE — Progress Notes (Signed)
 Supervised Weight Loss Visit Bariatric Nutrition Education Appt Start Time: 1055    End Time: 1111  Planned surgery: Gastric By-Pass Pt expectation of surgery: to get to at least 200 lbs and main goal to be healthy and have more energy  Referral stated Supervised Weight Loss (SWL) visits needed: 3 months  2 out of 3 SWL Appointments   NUTRITION ASSESSMENT   Anthropometrics  Start weight at [facility]: 312.2 lbs (date: 03/23/2024)  Height: 64.5 in Weight today: 315.9 lbs BMI: 53.39 kg/m2     Clinical  Medical hx: GERD, anxiety, HIV, obesity Medications: Vit D, symtuza   Labs: cholesterol 246, LDL 159 Notable signs/symptoms: non noted Any previous deficiencies? No  Lifestyle & Dietary Hx   Pt states she drinks out of Staley cup, stating it is a big tall one. Pt states most of the time it is plain water, stating sometimes it is with crystal light. Pt states she is eating breakfast. Pt states she doesn't always drink with her meals. Pt states she would like to increase physical activity and do more resistance exercises, stating she wants to get some light weights.  Estimated daily fluid intake: ??? 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  24-Hr Dietary Recall First Meal: biscuit, grits, turkey bacon, eggs, fruit or cereal Snack:  Second Meal: left overs or fast food ([employer], [employer], or chicken) Snack: pretzels or fruit Third Meal: skip or snack on something Snack: jello or fruit or peanut butter butter crackers or small salad Beverages: water, vitamin water, zero calorie soda, crystal light  Alcoholic beverages per week: 0   Estimated Energy Needs Calories: 1500  NUTRITION DIAGNOSIS  Overweight/obesity ([place]-3.3) related to past poor dietary habits and physical inactivity as evidenced by patient w/ planned gastric by-pass surgery  following dietary guidelines for continued weight loss.   NUTRITION INTERVENTION  Nutrition counseling (C-1) and education (E-2) to facilitate bariatric surgery goals.  Pre-Op Goals Reviewed with the Patient  Engaging in physical activity before bariatric surgery provides important health benefits and sets the stage for long-term success. Regular movement improves cardiovascular fitness, lowers blood pressure, and reduces surgical risks by strengthening the heart and lungs. Just as importantly, building consistent exercise habits before surgery creates routines that carry over afterward--helping preserve muscle mass, boost metabolism, and prevent weight regain. Establishing these patterns early makes it easier to transition into a sustainable, active lifestyle that supports recovery and long-term health.  Pre-Op Goals Progress & New Goals Continue: Cut out grazing between meals or at night; schedule meals and snacks.  Continue: Eat every 3-5 hours Continue: Decrease high sugar foods/decrease high fat/fried foods Continue: meal plan and prep (use the Bariatric MyPlate as a guide); pack you lunch for the workday; aim for a protein with every meal or snack Re-engage: practice not drinking with meals or snacks New: increase physical activity, aim for daily (morning, mid-day and evening), use light weights  Handouts Provided Include  Goals printed  Learning Style & Readiness for Change Teaching method utilized: Visual & Auditory  Demonstrated degree of understanding via: Teach Back  Readiness Level: preparation Barriers to learning/adherence to lifestyle change: nothing identified at this time  RD's Notes for next Visit  Patient progress toward chosen goals.  MONITORING & EVALUATION Dietary intake, weekly physical activity, body weight, and pre-op goals.  Next Steps  Patient is to return to  [facility] in 1 month for next SWL visit.

## 2024-05-14 ENCOUNTER — Other Ambulatory Visit (HOSPITAL_COMMUNITY): Payer: Self-pay

## 2024-05-18 ENCOUNTER — Other Ambulatory Visit: Payer: Self-pay

## 2024-05-18 NOTE — Progress Notes (Signed)
 Specialty Pharmacy Refill Coordination Note  Brenda Tyler is a 38 y.o. female contacted today regarding refills of specialty medication(s) Darun-Cobic-Emtricit-TenofAF (Symtuza )   Patient requested [name] at [facility] at [place] date: 05/19/24   Medication will be filled on: 05/18/24

## 2024-05-27 DIAGNOSIS — K219 Gastro-esophageal reflux disease without esophagitis: Secondary | ICD-10-CM | POA: Diagnosis not present

## 2024-05-27 DIAGNOSIS — Z79899 Other long term (current) drug therapy: Secondary | ICD-10-CM | POA: Diagnosis not present

## 2024-05-27 DIAGNOSIS — K8010 Calculus of gallbladder with chronic cholecystitis without obstruction: Secondary | ICD-10-CM | POA: Diagnosis not present

## 2024-05-27 DIAGNOSIS — Z6841 Body Mass Index (BMI) 40.0 and over, adult: Secondary | ICD-10-CM | POA: Diagnosis not present

## 2024-05-27 DIAGNOSIS — R6889 Other general symptoms and signs: Secondary | ICD-10-CM | POA: Diagnosis not present

## 2024-06-01 ENCOUNTER — Other Ambulatory Visit: Payer: Self-pay

## 2024-06-01 ENCOUNTER — Other Ambulatory Visit (HOSPITAL_COMMUNITY): Payer: Self-pay

## 2024-06-02 ENCOUNTER — Other Ambulatory Visit (HOSPITAL_COMMUNITY): Payer: Self-pay

## 2024-06-02 NOTE — Progress Notes (Signed)
 The pharmacy contacted Savaya on 06/01/24, and she requested a call back at a later time. A second attempt was made on 06/02/24, but she disconnected the call. The medication will be returned to stock, and a one-time refill outreach will be scheduled accordingly.

## 2024-06-08 ENCOUNTER — Encounter: Attending: Surgery | Admitting: Dietician

## 2024-06-08 ENCOUNTER — Encounter: Payer: Self-pay | Admitting: Dietician

## 2024-06-08 VITALS — Ht 64.5 in | Wt 323.5 lb

## 2024-06-08 DIAGNOSIS — E669 Obesity, unspecified: Secondary | ICD-10-CM | POA: Diagnosis present

## 2024-06-08 NOTE — Progress Notes (Signed)
 Supervised Weight Loss Visit Bariatric Nutrition Education Appt Start Time: 1038    End Time: 1059  Planned surgery: Gastric By-Pass Pt expectation of surgery: to get to at least 200 lbs and main goal to be healthy and have more energy  Referral stated Supervised Weight Loss (SWL) visits needed: 3 months  3 out of 3 SWL Appointments   Pt completed visits.   Pt has cleared nutrition requirements.   NUTRITION ASSESSMENT   Anthropometrics  Start weight at [facility]: 312.2 lbs (date: 03/23/2024)  Height: 64.5 in Weight today: 323.5 lbs BMI: 54.67 kg/m2     Clinical  Medical hx: GERD, anxiety, HIV, obesity Medications: Vit D, symtuza  Labs: cholesterol 246, LDL 159 Notable signs/symptoms: non noted Any previous deficiencies? No  Lifestyle & Dietary Hx  Pt states her weight is always fluctuation.  Pt states she went to the [facility] with a friend, stating she is also doing chair exercises. Pt states she got the weight she was going to get from [employer]. Pt states she has been doing well with everything, stating before she wasn't doing any of these things. Pt states she needs to focus more on her fluid intake, and stating she needs to be more physically active.  Estimated daily fluid intake: not tracking Supplements: vit D Current average weekly physical activity: ADLs, walking around the house; chair exercises and sit ups every other day. Two days a week she is doing light weights.  24-Hr Dietary Recall First Meal: biscuit, grits, turkey bacon, eggs, fruit or cereal Snack:  Second Meal: left overs or fast food ([employer], [employer], or chicken) Snack: pretzels or fruit Third Meal: skip or snack on something Snack: jello or fruit or peanut butter butter crackers or small salad Beverages: water, vitamin water, zero calorie soda, crystal light   Estimated Energy Needs Calories: 1500  NUTRITION DIAGNOSIS  Overweight/obesity (Roberts-3.3) related to past poor dietary habits and physical  inactivity as evidenced by patient w/ planned gastric by-pass surgery following dietary guidelines for continued weight loss.  NUTRITION INTERVENTION  Nutrition counseling (C-1) and education (E-2) to facilitate bariatric surgery goals.  Pre-Op Goals Reviewed with the Patient  After bariatric surgery, its important to prioritize protein at every meal and snack, since protein supports healing, preserves muscle mass, and helps you feel satisfied longer. The daily goal is about 60 grams of protein, which can be met by including lean sources such as chicken, fish, eggs, Greek yogurt, cottage cheese, beans, or protein shakes. Spreading protein intake evenly throughout the day makes it easier to reach this target and ensures your body has a steady supply to aid recovery and long-term health.  Pre-Op Goals Progress & New Goals Continue: Cut out grazing between meals or at night; schedule meals and snacks.  Continue: Eat every 3-5 hours Continue: Decrease high sugar foods/decrease high fat/fried foods Continue: meal plan and prep (use the Bariatric MyPlate as a guide); pack you lunch for the workday; aim for a protein with every meal or snack Continue: practice not drinking with meals or snacks Continue: increase physical activity, aim for daily (morning, mid-day and evening), use light weights New: track fluids; aim for 64 oz of fluids  Handouts Provided Include    Learning Style & Readiness for Change Teaching method utilized: Visual & Auditory  Demonstrated degree of understanding via: Teach Back  Readiness Level: preparation Barriers to learning/adherence to lifestyle change: nothing identified at this time  RD's Notes for next Visit  Patient progress toward chosen goals.  MONITORING & EVALUATION Dietary intake, weekly physical activity, body weight, and pre-op goals.  Next Steps  Pt has completed visits. No further supervised visits required/recommended. Patient is to return to [facility]  for pre-op class >2 weeks prior to scheduled surgery.

## 2024-06-09 ENCOUNTER — Other Ambulatory Visit (HOSPITAL_COMMUNITY): Payer: Self-pay

## 2024-06-10 ENCOUNTER — Other Ambulatory Visit: Payer: Self-pay

## 2024-06-10 ENCOUNTER — Ambulatory Visit

## 2024-06-11 ENCOUNTER — Other Ambulatory Visit: Payer: Self-pay

## 2024-06-11 ENCOUNTER — Other Ambulatory Visit (HOSPITAL_COMMUNITY): Payer: Self-pay

## 2024-06-11 NOTE — Progress Notes (Signed)
 Specialty Pharmacy Refill Coordination Note  Brenda Tyler is a 38 y.o. female contacted today regarding refills of specialty medication(s) Darun-Cobic-Emtricit-TenofAF (Symtuza )   Patient requested [name] at [facility] at [address] [place] date: 06/11/24   Medication will be filled on: 06/11/24

## 2024-06-19 ENCOUNTER — Other Ambulatory Visit: Payer: Self-pay

## 2024-06-19 ENCOUNTER — Other Ambulatory Visit (HOSPITAL_COMMUNITY): Payer: Self-pay

## 2024-06-19 NOTE — Progress Notes (Signed)
 Symtuza  returned to stock

## 2024-06-22 ENCOUNTER — Ambulatory Visit: Admitting: Family

## 2024-06-23 ENCOUNTER — Other Ambulatory Visit (HOSPITAL_COMMUNITY): Payer: Self-pay

## 2024-07-24 ENCOUNTER — Telehealth: Payer: Self-pay | Admitting: *Deleted

## 2024-07-24 DIAGNOSIS — F32A Depression, unspecified: Secondary | ICD-10-CM

## 2024-07-24 NOTE — Progress Notes (Unsigned)
 Complex Care Management Note Care Guide Note  07/24/2024 Name: Brenda Tyler MRN: [id number] DOB: 1986/06/17   Complex Care Management Outreach Attempts: An unsuccessful telephone outreach was attempted today to offer the patient information about available complex care management services.  Follow Up Plan:  Additional outreach attempts will be made to offer the patient complex care management information and services.   Encounter Outcome:  No Answer  Harlene Satterfield  [facility]  [facility], [facility] Guide  Direct Dial: [phone]  Fax [phone]

## 2024-07-27 ENCOUNTER — Encounter

## 2024-07-28 ENCOUNTER — Ambulatory Visit: Payer: Self-pay | Admitting: Surgery

## 2024-07-28 ENCOUNTER — Encounter

## 2024-07-28 VITALS — Wt 321.3 lb

## 2024-07-28 DIAGNOSIS — E669 Obesity, unspecified: Secondary | ICD-10-CM

## 2024-07-28 DIAGNOSIS — Z01818 Encounter for other preprocedural examination: Secondary | ICD-10-CM

## 2024-07-28 NOTE — Progress Notes (Signed)
 Pre-Operative Nutrition:    start Time: 1:51   Class End Time: 3:12   Patient was seen on 07/28/2024 for Pre-Operative Bariatric Surgery Education at [facility]    Surgery date: 08/10/2024 Surgery type: RYGB Start weight at [facility]: 312 Weight today: 321.3   The following the learning objectives were met by the patient during this course: Identify Pre-Op Dietary Goals and will begin 2 weeks pre-operatively Identify appropriate sources of fluids and proteins  State protein recommendations and appropriate sources pre and post-operatively Identify Post-Operative Dietary Goals and will follow for 2 weeks post-operatively Identify appropriate multivitamin, calcium, and thiamin sources Describe the need for physical activity post-operatively and will follow MD recommendations State when to call healthcare provider regarding medication questions or post-operative complications When having a diagnosis of diabetes understanding hypoglycemia symptoms and the inclusion of 1 complex carbohydrate per meal  Handouts given during class include: Pre-Op Bariatric Surgery Diet Handout Protein Shake Handout Post-Op Bariatric Surgery Nutrition Handout BELT Program Information Flyer [employer] Information Flyer [facility] Bariatric Supplements Price List  Follow-Up Plan: Patient will follow-up at [facility] 2 weeks post operatively for diet advancement per MD.

## 2024-07-28 NOTE — Progress Notes (Signed)
 Complex Care Management Note  Care Guide Note 07/28/2024 Name: [name] MRN: [id number] DOB: [birth year]  Brenda Tyler is a 39 y.o. year old female who sees Lenon Nell SAILOR, FNP for primary care. I reached out to Brenda Tyler by phone today to offer complex care management services.  Ms. Ganaway was given information about Complex Care Management services today including:   The Complex Care Management services include support from the care team which includes your Nurse Care Manager, Clinical Social Worker, or Pharmacist.  The Complex Care Management team is here to help remove barriers to the health concerns and goals most important to you. Complex Care Management services are voluntary, and the patient may decline or stop services at any time by request to their care team member.   Complex Care Management Consent Status: Patient did not agree to participate in complex care management services at this time.   Encounter Outcome:  Patient confirmed their provider is [facility]. Patient was advised to contact [employer] Medicaid to update their primary care provider information.  Harlene Satterfield  [facility]  [facility], [facility] Guide  Direct Dial: [phone]  Fax [phone]

## 2024-07-29 ENCOUNTER — Other Ambulatory Visit (HOSPITAL_COMMUNITY): Payer: Self-pay

## 2024-07-29 ENCOUNTER — Telehealth: Payer: Self-pay

## 2024-07-29 ENCOUNTER — Other Ambulatory Visit: Payer: Self-pay

## 2024-07-29 ENCOUNTER — Other Ambulatory Visit: Payer: Self-pay | Admitting: Pharmacist

## 2024-07-29 NOTE — Progress Notes (Signed)
 Clinical Intervention Note  Clinical Intervention Notes: Patient reported starting a multivitamin with iron and calcium as well as a vitamin B supplement. No DDIs with Symtuza .   Clinical Intervention Outcomes: Prevention of an adverse drug event   Lyle LELON Chalk Specialty Pharmacist

## 2024-07-29 NOTE — Telephone Encounter (Signed)
 Yes, she can split or crush the Symtuza  without any concern with efficacy. Make sure she does not crush/split until right when she is about to take the medicine.

## 2024-07-29 NOTE — Telephone Encounter (Signed)
 Patient called, states she is scheduled to have gastric bypass surgery on the 16th and that her surgeon advised her the Symtuza  would be too large for her to swallow. She is asking if her regimen can be adjusted or if she can crush the Symtuza .   Duwaine Lowe, BSN, RN

## 2024-07-29 NOTE — Progress Notes (Signed)
 Specialty Pharmacy Refill Coordination Note  Brenda Tyler is a 39 y.o. female contacted today regarding refills of specialty medication(s) Darun-Cobic-Emtricit-TenofAF (Symtuza )   Patient requested [name] at [facility] at [place] date: 07/30/24   Medication will be filled on: 07/29/24

## 2024-07-31 NOTE — Progress Notes (Signed)
 Date of COVID positive in last 90 days:  PCP - Nell Piety, FNP Cardiologist -   Chest x-ray - 03/11/24 Epic EKG - 03/11/24 Epic Stress Test - N/A ECHO - N/A Cardiac Cath - N/A Pacemaker/ICD device last checked:N/A Spinal Cord Stimulator:N/A  Bowel Prep - N/A  Sleep Study - N/A CPAP -  Fasting Blood Sugar - preDM Checks Blood Sugar _____ times a day  Last dose of GLP1 agonist-  N/A GLP1 instructions:  Do not take after     Last dose of SGLT-2 inhibitors-  N/A SGLT-2 instructions:  Do not take after     Blood Thinner Instructions: N/A Last dose:   Time: Aspirin  Instructions:N/A Last Dose:  Activity level:  Can go up a flight of stairs and perform activities of daily living without stopping and without symptoms of chest pain or shortness of breath.  Able to exercise without symptoms  Unable to go up a flight of stairs without symptoms of     Anesthesia review: N/A  Patient denies shortness of breath, fever, cough and chest pain at PAT appointment  Patient verbalized understanding of instructions that were given to them at the PAT appointment. Patient was also instructed that they will need to review over the PAT instructions again at home before surgery.

## 2024-07-31 NOTE — Patient Instructions (Signed)
 SURGICAL WAITING ROOM VISITATION  Patients having surgery or a procedure may have no more than 2 support people in the waiting area - these visitors may rotate.    Children ages 71 and under will not be able to visit patients in [facility] under most circumstances.   Visitors with respiratory illnesses are discouraged from visiting and should remain at home.  If the patient needs to stay at the hospital during part of their recovery, the visitor guidelines for inpatient rooms apply. Pre-op nurse will coordinate an appropriate time for 1 support person to accompany patient in pre-op.  This support person may not rotate.    Please refer to the [facility] website for the visitor guidelines for Inpatients (after your surgery is over and you are in a regular room).    Your procedure is scheduled on: 08/10/24   Report to [facility] Main Entrance    Report to admitting at 8:40 AM   Call this number if you have problems the morning of surgery [phone]   MORNING OF SURGERY DRINK:   DRINK 1 G2 drink BEFORE YOU LEAVE HOME, DRINK ALL OF THE  G2 DRINK AT ONE TIME.   NO SOLID FOOD AFTER 600 PM THE NIGHT BEFORE YOUR SURGERY. YOU MAY DRINK CLEAR FLUIDS. THE G2 DRINK YOU DRINK BEFORE YOU LEAVE HOME WILL BE THE LAST FLUIDS YOU DRINK BEFORE SURGERY.  PAIN IS EXPECTED AFTER SURGERY AND WILL NOT BE COMPLETELY ELIMINATED. AMBULATION AND TYLENOL  WILL HELP REDUCE INCISIONAL AND GAS PAIN. MOVEMENT IS KEY!  YOU ARE EXPECTED TO BE OUT OF BED WITHIN 4 HOURS OF ADMISSION TO YOUR PATIENT ROOM.  SITTING IN THE RECLINER THROUGHOUT THE DAY IS IMPORTANT FOR DRINKING FLUIDS AND MOVING GAS THROUGHOUT THE GI TRACT.  COMPRESSION STOCKINGS SHOULD BE WORN [facility] STAY UNLESS YOU ARE WALKING.   INCENTIVE SPIROMETER SHOULD BE USED EVERY HOUR WHILE AWAKE TO DECREASE POST-OPERATIVE COMPLICATIONS SUCH AS PNEUMONIA.  WHEN DISCHARGED HOME, IT IS IMPORTANT TO CONTINUE TO WALK EVERY HOUR  AND USE THE INCENTIVE SPIROMETER EVERY HOUR.    You may have the following liquids until 7:55 AM DAY OF SURGERY  Water Non-Citrus Juices (without pulp, NO RED-Apple, White grape, White cranberry) Black Coffee (NO MILK/CREAM OR CREAMERS, sugar ok)  Clear Tea (NO MILK/CREAM OR CREAMERS, sugar ok) regular and decaf                             Plain Jell-O (NO RED)                                           Fruit ices (not with fruit pulp, NO RED)                                     Popsicles (NO RED)                                                               Sports drinks like Gatorade (NO RED)  The day of surgery:  Drink ONE (1) Pre-Surgery G2 at 7:55 AM the morning of surgery. Drink in one sitting. Do not sip.  This drink was given to you during your hospital  pre-op appointment visit. Nothing else to drink after completing the  Pre-Surgery G2.          If you have questions, please contact your surgeons office.   FOLLOW BOWEL PREP AND ANY ADDITIONAL PRE OP INSTRUCTIONS YOU RECEIVED FROM YOUR SURGEON'S OFFICE!!!     Oral Hygiene is also important to reduce your risk of infection.                                    Remember - BRUSH YOUR TEETH THE MORNING OF SURGERY WITH YOUR REGULAR TOOTHPASTE  DENTURES WILL BE REMOVED PRIOR TO SURGERY PLEASE DO NOT APPLY Poly grip OR ADHESIVES!!!   Do NOT smoke after Midnight   Stop all vitamins and herbal supplements 7 days before surgery.   Take these medicines the morning of surgery with A SIP OF WATER: Symtuza   DO NOT TAKE ANY ORAL DIABETIC MEDICATIONS DAY OF YOUR SURGERY  Bring CPAP mask and tubing day of surgery.                              You may not have any metal on your body including hair pins, jewelry, and body piercing             Do not wear make-up, lotions, powders, perfumes, or deodorant  Do not wear nail polish including gel and S&S, artificial/acrylic nails, or any other type of covering on natural  nails including finger and toenails. If you have artificial nails, gel coating, etc. that needs to be removed by a nail salon please have this removed prior to surgery or surgery may need to be canceled/ delayed if the surgeon/ anesthesia feels like they are unable to be safely monitored.   Do not shave  48 hours prior to surgery.    Do not bring valuables to the hospital. [facility] IS NOT             RESPONSIBLE   FOR VALUABLES.   Contacts, glasses, dentures or bridgework may not be worn into surgery.   Bring small overnight bag day of surgery.   DO NOT BRING YOUR HOME MEDICATIONS TO THE HOSPITAL. PHARMACY WILL DISPENSE MEDICATIONS LISTED ON YOUR MEDICATION LIST TO YOU DURING YOUR ADMISSION IN THE HOSPITAL!   Special Instructions: Bring a copy of your healthcare power of attorney and living will documents the day of surgery if you haven't scanned them before.              Please read over the following fact sheets you were given: IF YOU HAVE QUESTIONS ABOUT YOUR PRE-OP INSTRUCTIONS PLEASE CALL [phone].   If you received a COVID test during your pre-op visit  it is requested that you wear a mask when out in public, stay away from anyone that may not be feeling well and notify your surgeon if you develop symptoms. If you test positive for Covid or have been in contact with anyone that has tested positive in the last 10 days please notify you surgeon.    [facility] - Preparing for Surgery Before surgery, you can play an important role.  Because skin is not sterile,  your skin needs to be as free of germs as possible.  You can reduce the number of germs on your skin by washing with CHG (chlorahexidine gluconate) soap before surgery.  CHG is an antiseptic cleaner which kills germs and bonds with the skin to continue killing germs even after washing. Please DO NOT use if you have an allergy to CHG or antibacterial soaps.  If your skin becomes reddened/irritated stop using the CHG and  inform your nurse when you arrive at Short Stay. Do not shave (including legs and underarms) for at least 48 hours prior to the first CHG shower.  You may shave your face/neck.  Please follow these instructions carefully:  1.  Shower with CHG Soap the night before surgery ONLY (DO NOT USE THE SOAP THE MORNING OF SURGERY).  2.  If you choose to wash your hair, wash your hair first as usual with your normal  shampoo.  3.  After you shampoo, rinse your hair and body thoroughly to remove the shampoo.                             4.  Use CHG as you would any other liquid soap.  You can apply chg directly to the skin and wash.  Gently with a scrungie or clean washcloth.  5.  Apply the CHG Soap to your body ONLY FROM THE NECK DOWN.   Do   not use on face/ open                           Wound or open sores. Avoid contact with eyes, ears mouth and   genitals (private parts).                       Wash face,  Genitals (private parts) with your normal soap.             6.  Wash thoroughly, paying special attention to the area where your    surgery  will be performed.  7.  Thoroughly rinse your body with warm water from the neck down.  8.  DO NOT shower/wash with your normal soap after using and rinsing off the CHG Soap.                9.  Pat yourself dry with a clean towel.            10.  Wear clean pajamas.            11.  Place clean sheets on your bed the night of your first shower and do not  sleep with pets. Day of Surgery : Do not apply any CHG, lotions/deodorants the morning of surgery.  Please wear clean clothes to the hospital/surgery center.  FAILURE TO FOLLOW THESE INSTRUCTIONS MAY RESULT IN THE CANCELLATION OF YOUR SURGERY  PATIENT SIGNATURE_________________________________  NURSE SIGNATURE__________________________________  ________________________________________________________________________

## 2024-08-03 ENCOUNTER — Encounter (HOSPITAL_COMMUNITY): Admission: RE | Admit: 2024-08-03

## 2024-08-10 ENCOUNTER — Ambulatory Visit (HOSPITAL_COMMUNITY): Admit: 2024-08-10 | Admitting: Surgery

## 2024-08-10 SURGERY — CREATION, GASTRIC BYPASS, ROUX-EN-Y, ROBOT-ASSISTED
Anesthesia: General

## 2024-08-25 ENCOUNTER — Encounter
